# Patient Record
Sex: Female | Born: 1950
Health system: Southern US, Community
[De-identification: ages and names within clinical notes are randomized; demographics above are authoritative.]

## PROBLEM LIST (undated history)

## (undated) DIAGNOSIS — E119 Type 2 diabetes mellitus without complications: Secondary | ICD-10-CM

## (undated) DIAGNOSIS — R42 Dizziness and giddiness: Secondary | ICD-10-CM

## (undated) DIAGNOSIS — K219 Gastro-esophageal reflux disease without esophagitis: Secondary | ICD-10-CM

## (undated) DIAGNOSIS — M1990 Unspecified osteoarthritis, unspecified site: Secondary | ICD-10-CM

## (undated) DIAGNOSIS — M545 Low back pain, unspecified: Secondary | ICD-10-CM

## (undated) DIAGNOSIS — G5600 Carpal tunnel syndrome, unspecified upper limb: Secondary | ICD-10-CM

## (undated) DIAGNOSIS — F419 Anxiety disorder, unspecified: Secondary | ICD-10-CM

## (undated) DIAGNOSIS — E785 Hyperlipidemia, unspecified: Secondary | ICD-10-CM

## (undated) DIAGNOSIS — F329 Major depressive disorder, single episode, unspecified: Secondary | ICD-10-CM

## (undated) DIAGNOSIS — K589 Irritable bowel syndrome without diarrhea: Secondary | ICD-10-CM

## (undated) DIAGNOSIS — Z780 Asymptomatic menopausal state: Secondary | ICD-10-CM

## (undated) DIAGNOSIS — G4730 Sleep apnea, unspecified: Secondary | ICD-10-CM

## (undated) DIAGNOSIS — I10 Essential (primary) hypertension: Secondary | ICD-10-CM

## (undated) DIAGNOSIS — IMO0002 Reserved for concepts with insufficient information to code with codable children: Secondary | ICD-10-CM

## (undated) DIAGNOSIS — G629 Polyneuropathy, unspecified: Secondary | ICD-10-CM

## (undated) DIAGNOSIS — J189 Pneumonia, unspecified organism: Secondary | ICD-10-CM

## (undated) DIAGNOSIS — I2510 Atherosclerotic heart disease of native coronary artery without angina pectoris: Secondary | ICD-10-CM

## (undated) DIAGNOSIS — N182 Chronic kidney disease, stage 2 (mild): Secondary | ICD-10-CM

## (undated) DIAGNOSIS — R911 Solitary pulmonary nodule: Secondary | ICD-10-CM

## (undated) HISTORY — PX: ABDOMINAL HYSTERECTOMY: SHX81

## (undated) HISTORY — DX: Carpal tunnel syndrome, unspecified upper limb: G56.00

## (undated) HISTORY — DX: Essential (primary) hypertension: I10

## (undated) HISTORY — DX: Low back pain, unspecified: M54.50

## (undated) HISTORY — DX: Irritable bowel syndrome, unspecified: K58.9

## (undated) HISTORY — DX: Type 2 diabetes mellitus without complications: E11.9

## (undated) HISTORY — DX: Low back pain: M54.5

## (undated) HISTORY — PX: MULTIPLE TOOTH EXTRACTIONS: SHX2053

## (undated) HISTORY — PX: CATARACT EXTRACTION: SUR2

## (undated) HISTORY — DX: Reserved for concepts with insufficient information to code with codable children: IMO0002

## (undated) HISTORY — DX: Unspecified osteoarthritis, unspecified site: M19.90

## (undated) HISTORY — DX: Chronic kidney disease, stage 2 (mild): N18.2

## (undated) HISTORY — DX: Atherosclerotic heart disease of native coronary artery without angina pectoris: I25.10

## (undated) HISTORY — DX: Asymptomatic menopausal state: Z78.0

## (undated) HISTORY — DX: Anxiety disorder, unspecified: F41.9

## (undated) HISTORY — DX: Solitary pulmonary nodule: R91.1

## (undated) HISTORY — PX: OTHER SURGICAL HISTORY: SHX169

## (undated) HISTORY — DX: Hyperlipidemia, unspecified: E78.5

## (undated) HISTORY — DX: Gastro-esophageal reflux disease without esophagitis: K21.9

## (undated) HISTORY — DX: Major depressive disorder, single episode, unspecified: F32.9

---

## 2002-06-26 DIAGNOSIS — I219 Acute myocardial infarction, unspecified: Secondary | ICD-10-CM

## 2002-06-26 HISTORY — DX: Acute myocardial infarction, unspecified: I21.9

## 2002-12-31 ENCOUNTER — Inpatient Hospital Stay (HOSPITAL_COMMUNITY): Admission: EM | Admit: 2002-12-31 | Discharge: 2003-01-03 | Payer: Self-pay | Admitting: *Deleted

## 2002-12-31 ENCOUNTER — Encounter: Payer: Self-pay | Admitting: Internal Medicine

## 2003-01-01 ENCOUNTER — Encounter (INDEPENDENT_AMBULATORY_CARE_PROVIDER_SITE_OTHER): Payer: Self-pay | Admitting: Cardiology

## 2003-01-02 ENCOUNTER — Encounter: Payer: Self-pay | Admitting: *Deleted

## 2003-01-09 ENCOUNTER — Encounter: Payer: Self-pay | Admitting: Family Medicine

## 2003-01-09 ENCOUNTER — Ambulatory Visit (HOSPITAL_COMMUNITY): Admission: RE | Admit: 2003-01-09 | Discharge: 2003-01-09 | Payer: Self-pay | Admitting: Family Medicine

## 2003-01-29 ENCOUNTER — Encounter: Payer: Self-pay | Admitting: Family Medicine

## 2003-01-29 ENCOUNTER — Ambulatory Visit (HOSPITAL_COMMUNITY): Admission: RE | Admit: 2003-01-29 | Discharge: 2003-01-29 | Payer: Self-pay | Admitting: Family Medicine

## 2003-12-30 ENCOUNTER — Ambulatory Visit (HOSPITAL_COMMUNITY): Admission: RE | Admit: 2003-12-30 | Discharge: 2003-12-30 | Payer: Self-pay | Admitting: Family Medicine

## 2004-01-08 ENCOUNTER — Inpatient Hospital Stay (HOSPITAL_COMMUNITY): Admission: RE | Admit: 2004-01-08 | Discharge: 2004-01-18 | Payer: Self-pay | Admitting: Specialist

## 2004-05-05 ENCOUNTER — Encounter (HOSPITAL_COMMUNITY): Admission: RE | Admit: 2004-05-05 | Discharge: 2004-06-09 | Payer: Self-pay | Admitting: Specialist

## 2005-10-25 ENCOUNTER — Ambulatory Visit (HOSPITAL_COMMUNITY): Admission: RE | Admit: 2005-10-25 | Discharge: 2005-10-25 | Payer: Self-pay | Admitting: Emergency Medicine

## 2005-11-21 ENCOUNTER — Ambulatory Visit: Payer: Self-pay | Admitting: Cardiology

## 2005-12-21 ENCOUNTER — Ambulatory Visit (HOSPITAL_COMMUNITY): Admission: RE | Admit: 2005-12-21 | Discharge: 2005-12-21 | Payer: Self-pay | Admitting: Emergency Medicine

## 2006-05-01 ENCOUNTER — Encounter (INDEPENDENT_AMBULATORY_CARE_PROVIDER_SITE_OTHER): Payer: Self-pay | Admitting: Family Medicine

## 2006-05-04 ENCOUNTER — Ambulatory Visit: Payer: Self-pay | Admitting: Family Medicine

## 2006-05-08 ENCOUNTER — Ambulatory Visit (HOSPITAL_COMMUNITY): Admission: RE | Admit: 2006-05-08 | Discharge: 2006-05-08 | Payer: Self-pay | Admitting: Family Medicine

## 2006-05-29 ENCOUNTER — Encounter: Payer: Self-pay | Admitting: Family Medicine

## 2006-05-29 DIAGNOSIS — E785 Hyperlipidemia, unspecified: Secondary | ICD-10-CM | POA: Insufficient documentation

## 2006-05-29 DIAGNOSIS — K589 Irritable bowel syndrome without diarrhea: Secondary | ICD-10-CM

## 2006-05-29 DIAGNOSIS — G5600 Carpal tunnel syndrome, unspecified upper limb: Secondary | ICD-10-CM

## 2006-05-29 DIAGNOSIS — M545 Low back pain, unspecified: Secondary | ICD-10-CM | POA: Insufficient documentation

## 2006-05-29 DIAGNOSIS — K5900 Constipation, unspecified: Secondary | ICD-10-CM | POA: Insufficient documentation

## 2006-05-29 DIAGNOSIS — M129 Arthropathy, unspecified: Secondary | ICD-10-CM | POA: Insufficient documentation

## 2006-05-29 DIAGNOSIS — I10 Essential (primary) hypertension: Secondary | ICD-10-CM | POA: Insufficient documentation

## 2006-05-31 ENCOUNTER — Ambulatory Visit: Payer: Self-pay | Admitting: Family Medicine

## 2006-06-01 ENCOUNTER — Ambulatory Visit (HOSPITAL_COMMUNITY): Admission: RE | Admit: 2006-06-01 | Discharge: 2006-06-01 | Payer: Self-pay | Admitting: Family Medicine

## 2006-06-02 ENCOUNTER — Encounter (INDEPENDENT_AMBULATORY_CARE_PROVIDER_SITE_OTHER): Payer: Self-pay | Admitting: Family Medicine

## 2006-06-14 ENCOUNTER — Ambulatory Visit: Payer: Self-pay | Admitting: Family Medicine

## 2006-06-28 ENCOUNTER — Encounter (INDEPENDENT_AMBULATORY_CARE_PROVIDER_SITE_OTHER): Payer: Self-pay | Admitting: Family Medicine

## 2006-07-05 ENCOUNTER — Ambulatory Visit: Payer: Self-pay | Admitting: Orthopedic Surgery

## 2006-07-18 ENCOUNTER — Encounter: Admission: RE | Admit: 2006-07-18 | Discharge: 2006-07-18 | Payer: Self-pay | Admitting: Specialist

## 2006-07-23 ENCOUNTER — Ambulatory Visit: Payer: Self-pay | Admitting: Family Medicine

## 2006-07-24 ENCOUNTER — Encounter (INDEPENDENT_AMBULATORY_CARE_PROVIDER_SITE_OTHER): Payer: Self-pay | Admitting: Family Medicine

## 2006-07-24 LAB — CONVERTED CEMR LAB
ALT: 14 units/L (ref 0–35)
AST: 22 units/L (ref 0–37)
Albumin: 4.1 g/dL (ref 3.5–5.2)
Alkaline Phosphatase: 52 units/L (ref 39–117)
BUN: 16 mg/dL (ref 6–23)
Basophils Absolute: 0.1 10*3/uL (ref 0.0–0.1)
Basophils Relative: 1 % (ref 0–1)
CO2: 21 meq/L (ref 19–32)
Eosinophils Relative: 6 % — ABNORMAL HIGH (ref 0–5)
Hemoglobin: 13 g/dL (ref 12.0–15.0)
Lymphs Abs: 2.5 10*3/uL (ref 0.7–3.3)
Platelets: 275 10*3/uL (ref 150–400)
Potassium: 3.9 meq/L (ref 3.5–5.3)
RDW: 14.1 % — ABNORMAL HIGH (ref 11.5–14.0)
Sodium: 143 meq/L (ref 135–145)
Triglycerides: 163 mg/dL — ABNORMAL HIGH (ref <150)
WBC: 6 10*3/uL (ref 4.0–10.5)

## 2006-07-31 ENCOUNTER — Encounter (INDEPENDENT_AMBULATORY_CARE_PROVIDER_SITE_OTHER): Payer: Self-pay | Admitting: Family Medicine

## 2006-07-31 ENCOUNTER — Ambulatory Visit: Payer: Self-pay | Admitting: Cardiology

## 2006-08-09 ENCOUNTER — Encounter: Payer: Self-pay | Admitting: Orthopedic Surgery

## 2006-08-09 ENCOUNTER — Encounter: Admission: RE | Admit: 2006-08-09 | Discharge: 2006-08-09 | Payer: Self-pay | Admitting: Specialist

## 2006-08-28 ENCOUNTER — Ambulatory Visit: Payer: Self-pay | Admitting: Family Medicine

## 2006-08-28 DIAGNOSIS — IMO0002 Reserved for concepts with insufficient information to code with codable children: Secondary | ICD-10-CM

## 2006-08-28 DIAGNOSIS — F341 Dysthymic disorder: Secondary | ICD-10-CM

## 2006-08-28 LAB — CONVERTED CEMR LAB: LDL Goal: 100 mg/dL

## 2006-09-13 ENCOUNTER — Emergency Department (HOSPITAL_COMMUNITY): Admission: EM | Admit: 2006-09-13 | Discharge: 2006-09-13 | Payer: Self-pay | Admitting: Emergency Medicine

## 2006-09-28 ENCOUNTER — Encounter (INDEPENDENT_AMBULATORY_CARE_PROVIDER_SITE_OTHER): Payer: Self-pay | Admitting: Family Medicine

## 2006-10-03 ENCOUNTER — Encounter (INDEPENDENT_AMBULATORY_CARE_PROVIDER_SITE_OTHER): Payer: Self-pay | Admitting: Family Medicine

## 2006-10-04 ENCOUNTER — Ambulatory Visit: Payer: Self-pay | Admitting: Orthopedic Surgery

## 2006-10-10 ENCOUNTER — Encounter (HOSPITAL_COMMUNITY): Admission: RE | Admit: 2006-10-10 | Discharge: 2006-11-09 | Payer: Self-pay | Admitting: Orthopedic Surgery

## 2006-10-15 ENCOUNTER — Ambulatory Visit: Payer: Self-pay | Admitting: Orthopedic Surgery

## 2006-10-23 ENCOUNTER — Ambulatory Visit: Payer: Self-pay | Admitting: Family Medicine

## 2006-12-04 ENCOUNTER — Encounter (INDEPENDENT_AMBULATORY_CARE_PROVIDER_SITE_OTHER): Payer: Self-pay | Admitting: Family Medicine

## 2006-12-26 ENCOUNTER — Encounter
Admission: RE | Admit: 2006-12-26 | Discharge: 2007-03-26 | Payer: Self-pay | Admitting: Physical Medicine & Rehabilitation

## 2006-12-26 ENCOUNTER — Ambulatory Visit: Payer: Self-pay | Admitting: Physical Medicine & Rehabilitation

## 2007-01-22 ENCOUNTER — Ambulatory Visit: Payer: Self-pay | Admitting: Family Medicine

## 2007-01-22 ENCOUNTER — Telehealth (INDEPENDENT_AMBULATORY_CARE_PROVIDER_SITE_OTHER): Payer: Self-pay | Admitting: *Deleted

## 2007-01-23 ENCOUNTER — Telehealth (INDEPENDENT_AMBULATORY_CARE_PROVIDER_SITE_OTHER): Payer: Self-pay | Admitting: *Deleted

## 2007-01-23 LAB — CONVERTED CEMR LAB
ALT: 15 units/L (ref 0–35)
Albumin: 4.3 g/dL (ref 3.5–5.2)
CO2: 29 meq/L (ref 19–32)
Chloride: 103 meq/L (ref 96–112)
Cholesterol: 132 mg/dL (ref 0–200)
HDL: 52 mg/dL (ref >39)
LDL Cholesterol: 57 mg/dL (ref 0–99)
Sodium: 142 meq/L (ref 135–145)
Total Protein: 7.3 g/dL (ref 6.0–8.3)
VLDL: 23 mg/dL (ref 0–40)

## 2007-01-25 ENCOUNTER — Encounter (INDEPENDENT_AMBULATORY_CARE_PROVIDER_SITE_OTHER): Payer: Self-pay | Admitting: Family Medicine

## 2007-01-28 ENCOUNTER — Ambulatory Visit: Payer: Self-pay | Admitting: Physical Medicine & Rehabilitation

## 2007-01-29 ENCOUNTER — Ambulatory Visit (HOSPITAL_COMMUNITY): Admission: RE | Admit: 2007-01-29 | Discharge: 2007-01-29 | Payer: Self-pay | Admitting: Family Medicine

## 2007-01-31 ENCOUNTER — Telehealth (INDEPENDENT_AMBULATORY_CARE_PROVIDER_SITE_OTHER): Payer: Self-pay | Admitting: *Deleted

## 2007-02-01 ENCOUNTER — Encounter
Admission: RE | Admit: 2007-02-01 | Discharge: 2007-05-02 | Payer: Self-pay | Admitting: Physical Medicine & Rehabilitation

## 2007-02-04 ENCOUNTER — Ambulatory Visit: Payer: Self-pay | Admitting: Physical Medicine & Rehabilitation

## 2007-02-15 ENCOUNTER — Encounter (INDEPENDENT_AMBULATORY_CARE_PROVIDER_SITE_OTHER): Payer: Self-pay | Admitting: Family Medicine

## 2007-02-15 ENCOUNTER — Ambulatory Visit (HOSPITAL_COMMUNITY): Admission: RE | Admit: 2007-02-15 | Discharge: 2007-02-15 | Payer: Self-pay | Admitting: Obstetrics & Gynecology

## 2007-03-12 ENCOUNTER — Ambulatory Visit: Payer: Self-pay | Admitting: Family Medicine

## 2007-03-12 DIAGNOSIS — K219 Gastro-esophageal reflux disease without esophagitis: Secondary | ICD-10-CM | POA: Insufficient documentation

## 2007-04-02 ENCOUNTER — Ambulatory Visit: Payer: Self-pay | Admitting: Physical Medicine & Rehabilitation

## 2007-05-02 ENCOUNTER — Encounter
Admission: RE | Admit: 2007-05-02 | Discharge: 2007-05-03 | Payer: Self-pay | Admitting: Physical Medicine & Rehabilitation

## 2007-05-02 ENCOUNTER — Ambulatory Visit: Payer: Self-pay | Admitting: Physical Medicine & Rehabilitation

## 2007-05-13 ENCOUNTER — Ambulatory Visit: Payer: Self-pay | Admitting: Family Medicine

## 2007-05-14 ENCOUNTER — Telehealth (INDEPENDENT_AMBULATORY_CARE_PROVIDER_SITE_OTHER): Payer: Self-pay | Admitting: *Deleted

## 2007-05-14 LAB — CONVERTED CEMR LAB
AST: 16 units/L (ref 0–37)
Albumin: 4.3 g/dL (ref 3.5–5.2)
BUN: 19 mg/dL (ref 6–23)
CO2: 29 meq/L (ref 19–32)
Calcium: 9.7 mg/dL (ref 8.4–10.5)
Creatinine, Ser: 1.18 mg/dL (ref 0.40–1.20)
Glucose, Bld: 80 mg/dL (ref 70–99)
Potassium: 4.2 meq/L (ref 3.5–5.3)
Total Bilirubin: 0.8 mg/dL (ref 0.3–1.2)
Total Protein: 7.4 g/dL (ref 6.0–8.3)

## 2007-05-21 ENCOUNTER — Encounter (INDEPENDENT_AMBULATORY_CARE_PROVIDER_SITE_OTHER): Payer: Self-pay | Admitting: Family Medicine

## 2007-05-29 ENCOUNTER — Encounter (INDEPENDENT_AMBULATORY_CARE_PROVIDER_SITE_OTHER): Payer: Self-pay | Admitting: Family Medicine

## 2007-06-25 ENCOUNTER — Ambulatory Visit: Payer: Self-pay | Admitting: Physical Medicine & Rehabilitation

## 2007-06-25 ENCOUNTER — Encounter
Admission: RE | Admit: 2007-06-25 | Discharge: 2007-09-05 | Payer: Self-pay | Admitting: Physical Medicine & Rehabilitation

## 2007-08-09 ENCOUNTER — Encounter (INDEPENDENT_AMBULATORY_CARE_PROVIDER_SITE_OTHER): Payer: Self-pay | Admitting: Family Medicine

## 2007-08-09 ENCOUNTER — Ambulatory Visit: Payer: Self-pay | Admitting: Cardiology

## 2007-08-12 ENCOUNTER — Ambulatory Visit: Payer: Self-pay | Admitting: Family Medicine

## 2007-08-12 ENCOUNTER — Telehealth (INDEPENDENT_AMBULATORY_CARE_PROVIDER_SITE_OTHER): Payer: Self-pay | Admitting: *Deleted

## 2007-09-02 ENCOUNTER — Ambulatory Visit: Payer: Self-pay | Admitting: Internal Medicine

## 2007-09-10 ENCOUNTER — Ambulatory Visit (HOSPITAL_COMMUNITY): Admission: RE | Admit: 2007-09-10 | Discharge: 2007-09-10 | Payer: Self-pay | Admitting: Internal Medicine

## 2007-09-10 ENCOUNTER — Ambulatory Visit: Payer: Self-pay | Admitting: Internal Medicine

## 2007-10-16 ENCOUNTER — Encounter
Admission: RE | Admit: 2007-10-16 | Discharge: 2007-10-18 | Payer: Self-pay | Admitting: Physical Medicine & Rehabilitation

## 2007-10-17 ENCOUNTER — Encounter: Payer: Self-pay | Admitting: Orthopedic Surgery

## 2007-10-17 ENCOUNTER — Telehealth (INDEPENDENT_AMBULATORY_CARE_PROVIDER_SITE_OTHER): Payer: Self-pay | Admitting: *Deleted

## 2007-10-17 ENCOUNTER — Ambulatory Visit (HOSPITAL_COMMUNITY): Admission: RE | Admit: 2007-10-17 | Discharge: 2007-10-17 | Payer: Self-pay | Admitting: Family Medicine

## 2007-10-18 ENCOUNTER — Ambulatory Visit: Payer: Self-pay | Admitting: Physical Medicine & Rehabilitation

## 2007-11-06 ENCOUNTER — Encounter
Admission: RE | Admit: 2007-11-06 | Discharge: 2007-11-11 | Payer: Self-pay | Admitting: Physical Medicine & Rehabilitation

## 2007-11-11 ENCOUNTER — Ambulatory Visit: Payer: Self-pay | Admitting: Physical Medicine & Rehabilitation

## 2007-11-11 ENCOUNTER — Ambulatory Visit: Payer: Self-pay | Admitting: Family Medicine

## 2007-11-11 LAB — CONVERTED CEMR LAB
Bilirubin Urine: NEGATIVE
Blood in Urine, dipstick: NEGATIVE
Nitrite: NEGATIVE
Protein, U semiquant: NEGATIVE
Specific Gravity, Urine: 1.01
Urobilinogen, UA: 0.2

## 2007-11-12 LAB — CONVERTED CEMR LAB
ALT: 16 units/L (ref 0–35)
AST: 20 units/L (ref 0–37)
Albumin: 3.9 g/dL (ref 3.5–5.2)
Alkaline Phosphatase: 61 units/L (ref 39–117)
BUN: 13 mg/dL (ref 6–23)
Basophils Absolute: 0.1 10*3/uL (ref 0.0–0.1)
Basophils Relative: 1 % (ref 0–1)
CO2: 23 meq/L (ref 19–32)
Cholesterol: 145 mg/dL (ref 0–200)
LDL Cholesterol: 61 mg/dL (ref 0–99)
MCHC: 31.9 g/dL (ref 30.0–36.0)
Platelets: 281 10*3/uL (ref 150–400)
Potassium: 3.9 meq/L (ref 3.5–5.3)
TSH: 0.446 microintl units/mL (ref 0.350–5.50)
Total Protein: 7 g/dL (ref 6.0–8.3)
WBC: 6.1 10*3/uL (ref 4.0–10.5)

## 2007-12-13 ENCOUNTER — Encounter
Admission: RE | Admit: 2007-12-13 | Discharge: 2007-12-17 | Payer: Self-pay | Admitting: Physical Medicine & Rehabilitation

## 2007-12-17 ENCOUNTER — Ambulatory Visit: Payer: Self-pay | Admitting: Physical Medicine & Rehabilitation

## 2008-01-01 ENCOUNTER — Encounter
Admission: RE | Admit: 2008-01-01 | Discharge: 2008-02-27 | Payer: Self-pay | Admitting: Physical Medicine & Rehabilitation

## 2008-01-02 ENCOUNTER — Ambulatory Visit: Payer: Self-pay | Admitting: Physical Medicine & Rehabilitation

## 2008-02-03 ENCOUNTER — Ambulatory Visit: Payer: Self-pay | Admitting: Physical Medicine & Rehabilitation

## 2008-02-10 ENCOUNTER — Ambulatory Visit: Payer: Self-pay | Admitting: Family Medicine

## 2008-02-10 DIAGNOSIS — R7309 Other abnormal glucose: Secondary | ICD-10-CM

## 2008-02-11 ENCOUNTER — Ambulatory Visit (HOSPITAL_COMMUNITY): Admission: RE | Admit: 2008-02-11 | Discharge: 2008-02-11 | Payer: Self-pay | Admitting: Family Medicine

## 2008-02-24 ENCOUNTER — Ambulatory Visit: Payer: Self-pay | Admitting: Family Medicine

## 2008-02-24 DIAGNOSIS — E669 Obesity, unspecified: Secondary | ICD-10-CM

## 2008-02-27 ENCOUNTER — Ambulatory Visit: Payer: Self-pay | Admitting: Physical Medicine & Rehabilitation

## 2008-03-19 ENCOUNTER — Ambulatory Visit: Payer: Self-pay | Admitting: Family Medicine

## 2008-03-25 ENCOUNTER — Encounter
Admission: RE | Admit: 2008-03-25 | Discharge: 2008-06-23 | Payer: Self-pay | Admitting: Physical Medicine & Rehabilitation

## 2008-03-26 ENCOUNTER — Encounter
Admission: RE | Admit: 2008-03-26 | Discharge: 2008-03-26 | Payer: Self-pay | Admitting: Physical Medicine & Rehabilitation

## 2008-03-26 ENCOUNTER — Ambulatory Visit: Payer: Self-pay | Admitting: Physical Medicine & Rehabilitation

## 2008-04-02 ENCOUNTER — Ambulatory Visit: Payer: Self-pay | Admitting: Family Medicine

## 2008-04-24 ENCOUNTER — Ambulatory Visit: Payer: Self-pay | Admitting: Physical Medicine & Rehabilitation

## 2008-04-27 ENCOUNTER — Ambulatory Visit: Payer: Self-pay | Admitting: Orthopedic Surgery

## 2008-04-27 DIAGNOSIS — M25559 Pain in unspecified hip: Secondary | ICD-10-CM | POA: Insufficient documentation

## 2008-05-01 ENCOUNTER — Ambulatory Visit (HOSPITAL_COMMUNITY)
Admission: RE | Admit: 2008-05-01 | Discharge: 2008-05-01 | Payer: Self-pay | Admitting: Physical Medicine & Rehabilitation

## 2008-05-13 ENCOUNTER — Ambulatory Visit: Payer: Self-pay | Admitting: Physical Medicine & Rehabilitation

## 2008-06-02 ENCOUNTER — Encounter (INDEPENDENT_AMBULATORY_CARE_PROVIDER_SITE_OTHER): Payer: Self-pay | Admitting: Family Medicine

## 2008-06-15 ENCOUNTER — Ambulatory Visit (HOSPITAL_COMMUNITY): Admission: RE | Admit: 2008-06-15 | Discharge: 2008-06-15 | Payer: Self-pay | Admitting: Family Medicine

## 2008-07-02 ENCOUNTER — Ambulatory Visit (HOSPITAL_COMMUNITY): Admission: RE | Admit: 2008-07-02 | Discharge: 2008-07-02 | Payer: Self-pay | Admitting: Family Medicine

## 2008-07-02 ENCOUNTER — Ambulatory Visit: Payer: Self-pay | Admitting: Family Medicine

## 2008-07-02 DIAGNOSIS — M25569 Pain in unspecified knee: Secondary | ICD-10-CM

## 2008-07-03 ENCOUNTER — Encounter (INDEPENDENT_AMBULATORY_CARE_PROVIDER_SITE_OTHER): Payer: Self-pay | Admitting: Family Medicine

## 2008-07-06 LAB — CONVERTED CEMR LAB
Albumin: 4.4 g/dL (ref 3.5–5.2)
Calcium: 9.3 mg/dL (ref 8.4–10.5)
Glucose, Bld: 96 mg/dL (ref 70–99)
Sodium: 141 meq/L (ref 135–145)
Total Protein: 7.3 g/dL (ref 6.0–8.3)

## 2008-08-24 ENCOUNTER — Ambulatory Visit: Payer: Self-pay | Admitting: Cardiology

## 2008-11-26 ENCOUNTER — Ambulatory Visit: Payer: Self-pay | Admitting: Family Medicine

## 2008-11-26 LAB — CONVERTED CEMR LAB
Blood in Urine, dipstick: NEGATIVE
Glucose, Urine, Semiquant: NEGATIVE
Specific Gravity, Urine: 1.02
Urobilinogen, UA: NEGATIVE
WBC Urine, dipstick: NEGATIVE
pH: 5

## 2008-11-30 ENCOUNTER — Encounter (INDEPENDENT_AMBULATORY_CARE_PROVIDER_SITE_OTHER): Payer: Self-pay | Admitting: Family Medicine

## 2008-12-01 LAB — CONVERTED CEMR LAB
CO2: 27 meq/L (ref 19–32)
Calcium: 9.6 mg/dL (ref 8.4–10.5)
Creatinine, Ser: 1.01 mg/dL (ref 0.40–1.20)
Eosinophils Absolute: 0.3 10*3/uL (ref 0.0–0.7)
Glucose, Bld: 100 mg/dL — ABNORMAL HIGH (ref 70–99)
HCT: 39 % (ref 36.0–46.0)
HDL: 51 mg/dL (ref >39)
MCHC: 32.8 g/dL (ref 30.0–36.0)
Monocytes Absolute: 0.4 10*3/uL (ref 0.1–1.0)
Neutro Abs: 2 10*3/uL (ref 1.7–7.7)
Platelets: 280 10*3/uL (ref 150–400)
Potassium: 4 meq/L (ref 3.5–5.3)
RBC: 4.3 M/uL (ref 3.87–5.11)
Sodium: 143 meq/L (ref 135–145)
Total Protein: 7.3 g/dL (ref 6.0–8.3)

## 2008-12-03 ENCOUNTER — Ambulatory Visit: Payer: Self-pay | Admitting: Family Medicine

## 2008-12-03 LAB — CONVERTED CEMR LAB
Blood Glucose, AC Bkfst: 102 mg/dL
Hgb A1c MFr Bld: 5.9 %

## 2008-12-14 ENCOUNTER — Telehealth (INDEPENDENT_AMBULATORY_CARE_PROVIDER_SITE_OTHER): Payer: Self-pay | Admitting: *Deleted

## 2009-02-02 ENCOUNTER — Ambulatory Visit: Payer: Self-pay | Admitting: Family Medicine

## 2009-02-23 ENCOUNTER — Encounter (INDEPENDENT_AMBULATORY_CARE_PROVIDER_SITE_OTHER): Payer: Self-pay | Admitting: Family Medicine

## 2009-03-23 ENCOUNTER — Encounter (INDEPENDENT_AMBULATORY_CARE_PROVIDER_SITE_OTHER): Payer: Self-pay | Admitting: *Deleted

## 2009-03-23 LAB — CONVERTED CEMR LAB: Creatinine, Ser: 1.04 mg/dL

## 2009-05-26 ENCOUNTER — Encounter (INDEPENDENT_AMBULATORY_CARE_PROVIDER_SITE_OTHER): Payer: Self-pay | Admitting: *Deleted

## 2009-05-26 ENCOUNTER — Ambulatory Visit: Payer: Self-pay | Admitting: Cardiology

## 2009-05-26 DIAGNOSIS — I2510 Atherosclerotic heart disease of native coronary artery without angina pectoris: Secondary | ICD-10-CM | POA: Insufficient documentation

## 2009-06-15 ENCOUNTER — Encounter: Payer: Self-pay | Admitting: Family Medicine

## 2009-06-16 ENCOUNTER — Telehealth (INDEPENDENT_AMBULATORY_CARE_PROVIDER_SITE_OTHER): Payer: Self-pay | Admitting: *Deleted

## 2009-06-22 ENCOUNTER — Inpatient Hospital Stay (HOSPITAL_COMMUNITY): Admission: RE | Admit: 2009-06-22 | Discharge: 2009-06-26 | Payer: Self-pay | Admitting: Specialist

## 2009-12-16 ENCOUNTER — Encounter (INDEPENDENT_AMBULATORY_CARE_PROVIDER_SITE_OTHER): Payer: Self-pay

## 2009-12-16 LAB — CONVERTED CEMR LAB
ALT: 18 units/L
AST: 21 units/L
Alkaline Phosphatase: 80 units/L
BUN: 15 mg/dL
CO2: 25 meq/L
Creatinine, Ser: 1.11 mg/dL
Glomerular Filtration Rate, Af Am: 62 mL/min/{1.73_m2}
HDL: 49 mg/dL
Hemoglobin: 12.2 g/dL
Sodium: 142 meq/L
WBC: 5.2 10*3/uL

## 2010-02-11 ENCOUNTER — Ambulatory Visit: Payer: Self-pay | Admitting: Cardiology

## 2010-02-11 ENCOUNTER — Encounter: Payer: Self-pay | Admitting: Adult Health

## 2010-02-14 ENCOUNTER — Encounter: Payer: Self-pay | Admitting: Cardiology

## 2010-07-18 ENCOUNTER — Encounter: Payer: Self-pay | Admitting: Family Medicine

## 2010-07-26 NOTE — Assessment & Plan Note (Signed)
Summary: 8/11 f/u per checkout on 05/26/09/tg  Medications Added SIMVASTATIN 80 MG TABS (SIMVASTATIN) One nightly RA ACID REDUCER 75 MG TABS (RANITIDINE HCL) take 1 tab daily VITAMIN D 400 UNIT CAPS (CHOLECALCIFEROL) take 1 tab daily GABAPENTIN 100 MG CAPS (GABAPENTIN) take 3 tabs daily NITROLINGUAL 0.4 MG/SPRAY SOLN (NITROGLYCERIN) One spray under tongue every 5 minutes as needed for chest pain---may repeat times three      Allergies Added: NKDA  Visit Type:  Follow-up Referring Provider:  Dr. Vira [name] Primary Provider:  Dr.Steve [name]  CC:  no cardiology complaints today.  History of Present Illness: [name] is a pleasant obese, 60 y/o AAF we are following with known history of NQWMI in 2004 with implantation of DES to the ostium of the RCA. On last visit she was without of chest discomfort.  She has had lumbar spine surgery in December of 2010 and status that it has been helpful to her. She unfortunately has not increased her activity since she is feeling better.  She remains very sedentary.  She was also placed on simvistatin 80mg  at HS due to hypercholesterolemia.  She is here for follow-up. She is without cardiac complaint.  Preventive Screening-Counseling & Management  Caffeine-Diet-Exercise     Does Patient Exercise: no     Exercise Counseling: to improve exercise regimen  Current Medications (verified): 1)  Lisinopril 40 Mg Tabs (Lisinopril) .... Once Daily 2)  Simvastatin 80 Mg Tabs (Simvastatin) .... One Nightly 3)  Norvasc 10 Mg Tabs (Amlodipine Besylate) .... Once Daily 4)  Aspirin 81 Mg Chew (Aspirin) .... Once Daily 5)  Hydrochlorothiazide 25 Mg  Tabs (Hydrochlorothiazide) .... One Daily 6)  Ra Acid Reducer 75 Mg Tabs (Ranitidine Hcl) .... Take 1 Tab Daily 7)  Vitamin D 400 Unit Caps (Cholecalciferol) .... Take 1 Tab Daily 8)  Gabapentin 100 Mg Caps (Gabapentin) .... Take 3 Tabs Daily 9)  Nitrolingual 0.4 Mg/spray Soln (Nitroglycerin) .... One Spray Under  Tongue Every 5 Minutes As Needed For Chest Pain---May Repeat Times Three  Allergies (verified): No Known Drug Allergies  Past History:  Past medical, surgical, family and social histories (including risk factors) reviewed, and no changes noted (except as noted below).  Past Medical History: Reviewed history from 05/26/2009 and no changes required. ASCVD:non-Q myocardial infarction in 2004 with PCI of the RCA; normal ejection fraction HYPERTENSION (ICD-401.9) HYPERLIPIDEMIA (ICD-272.4) SPECIAL SCREENING FOR MALIGNANT NEOPLASMS COLON (ICD-V76.51) CONSTIPATION (ICD-564.00) VIRAL URI (ICD-465.9) GERD (ICD-530.81) PULMONARY NODULE - REPEAT CT AUGUST 2009 (ICD-518.89) RISK OF SLEEP APNEA (ICD-V12.59) ANXIETY DEPRESSION (ICD-300.4) DEGENERATIVE DISC DISEASE (ICD-722.6)5 s/p lumbar laminectomy and cervical discectomy/fusion; Pain clinic CARPAL TUNNEL SYNDROME (ICD-354.0) ARTHRITIS (ICD-716.90) CONSTIPATION NOS (ICD-564.00) IBS (ICD-564.1) LOW BACK PAIN (ICD-724.2)  Past Surgical History: Reviewed history from 05/26/2009 and no changes required. Cholecystectomy for gallstones Excision of ovarian cyst Hysterectomy due to fibroids and excessibve bleeding - partial L4-5 Fusion with pedicle screws and rods in 2005 by Dr. Otelia [name]  Right shoulder RTC Cervical discectomy and fusion at C5-6  Family History: Reviewed history from 11/26/2008 and no changes required. Father: Dead 74 CKD Mother: Dead 30 CVA Siblings:  Sister Dead 71 "CAD, Cardiomyopathy" Brother x 2: Dead in 64s from some type of cancer, 61 - HTN, MVA  KIds - boys x 1 - age 49  - HTN Daughters - none  Social History: Reviewed history from 05/26/2009 and no changes required. 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   Vital Signs:  Patient profile:   60 year old female Weight:      248 pounds Pulse rate:   78 / minute BP sitting:   120 / 80  (right arm)  Vitals Entered By: [name] Saa, CNA (February 11, 2010 11:06 AM)  Physical Exam  General:  Well developed, well nourished, in no acute distress. Lungs:  Clear bilaterally to auscultation and percussion. Heart:  Non-displaced PMI, chest non-tender; regular rate and rhythm, S1, S2 without murmurs, rubs or gallops. Carotid upstroke normal, no bruit. Normal abdominal aortic size, no bruits. Femorals normal pulses, no bruits. Pedals normal pulses. No edema, no varicosities. Abdomen:  Bowel sounds positive; abdomen soft and non-tender without masses, organomegaly, or hernias noted. No hepatosplenomegaly. Msk:  Back normal, normal gait. Muscle strength and tone normal. Pulses:  pulses normal in all 4 extremities Extremities:  No clubbing or cyanosis. Neurologic:  Alert and oriented x 3. Psych:  Normal affect.   EKG  Procedure date:  02/11/2010  Findings:      Normal sinus rhythm with rate of:  76 bpm  Impression & Recommendations:  Problem # 1:  ATHEROSCLEROTIC CARDIOVASCULAR DISEASE (ICD-429.2) She appears stable from CV standpoint.  She is asymptomatic.  No medication changes are warrented at this time.  Will see her in 1 year.  Problem # 2:  HYPERLIPIDEMIA (ICD-272.4) Reviewed recent lab work post statin: TC: 170, TG 186 LDL 84, HDL 49 which is improved.  She will need wt loss and increased exercise to help with this. Low cholesterol diet. Her updated medication list for this problem includes:    Simvastatin 80 Mg Tabs (Simvastatin) ..... One nightly  Problem # 3:  OBESITY (ICD-278.00) Weight loss, discussed doing 15 minutes of exercise-walking every day  Patient Instructions: 1)  Your physician recommends that you schedule a follow-up appointment in: 1 year 2)  Your physician  has recommended you make the following change in your medication: Start using Nitroglycerin spray as needed for chest pain as directed 3)  Your physician recommended you take 1 tablet (or 1 spray) under tongue at onset of chest pain; you may repeat every 5 minutes for up to 3 doses. If 3 or more doses are required, call 911 and proceed to the ER immediately. Prescriptions: SIMVASTATIN 80 MG TABS (SIMVASTATIN) One nightly  #30 x 11   Entered by:   Larita Fife Via LPN   Authorized by:   Joni Reining, NP   Signed by:   Larita Fife Via LPN on 16/03/9603   Method used:   Electronically to        [facility] [phone]* (retail)       [address]       [place], [place]  [address]       Ph: [phone] or [phone]       Fax: [phone]   RxID:   [id number]

## 2010-07-26 NOTE — Miscellaneous (Signed)
Summary: CMP, CBC, Lipids, HGB A1c and TSH  Clinical Lists Changes  Observations: Added new observation of CALCIUM: 9.4 mg/dL (16/03/9603 54:09) Added new observation of ALBUMIN: 4.4 g/dL (81/19/1478 29:56) Added new observation of PROTEIN, TOT: 7.1 g/dL (21/30/8657 84:69) Added new observation of SGPT (ALT): 18 units/L (12/16/2009 15:15) Added new observation of SGOT (AST): 21 units/L (12/16/2009 15:15) Added new observation of ALK PHOS: 80 units/L (12/16/2009 15:15) Added new observation of BILI DIRECT: BILI Total: 0.2 mg/dL (62/95/2841 32:44) Added new observation of GFR AA: 62 mL/min/1.21m2 (12/16/2009 15:15) Added new observation of GFR: 54 mL/min (12/16/2009 15:15) Added new observation of CREATININE: 1.11 mg/dL (06/28/7251 66:44) Added new observation of BUN: 15 mg/dL (03/47/4259 56:38) Added new observation of CO2 PLSM/SER: 25 meq/L (12/16/2009 15:15) Added new observation of CL SERUM: 106 meq/L (12/16/2009 15:15) Added new observation of K SERUM: 4.2 meq/L (12/16/2009 15:15) Added new observation of NA: 142 meq/L (12/16/2009 15:15) Added new observation of LDL: 84 mg/dL (75/64/3329 51:88) Added new observation of HDL: 49 mg/dL (41/66/0630 16:01) Added new observation of TRIGLYC TOT: 186 mg/dL (09/32/3557 32:20) Added new observation of CHOLESTEROL: 170 mg/dL (25/42/7062 37:62) Added new observation of PLATELETK/UL: 320 K/uL (12/16/2009 15:15) Added new observation of MCV: 90 fL (12/16/2009 15:15) Added new observation of HCT: 35.8 % (12/16/2009 15:15) Added new observation of HGB: 12.2 g/dL (83/15/1761 60:73) Added new observation of WBC COUNT: 5.2 10*3/microliter (12/16/2009 15:15) Added new observation of TSH: 2.250 microintl units/mL (12/16/2009 15:15) Added new observation of HGBA1C: 5.9 % (12/16/2009 15:15)

## 2010-07-26 NOTE — Letter (Signed)
Summary: LABS 12-16-09  LABS 12-16-09   Imported By: Faythe Ghee 02/14/2010 11:58:25  _____________________________________________________________________  External Attachment:    Type:   Image     Comment:   External Document

## 2010-09-26 LAB — BASIC METABOLIC PANEL
BUN: 11 mg/dL (ref 6–23)
BUN: 9 mg/dL (ref 6–23)
CO2: 28 mEq/L (ref 19–32)
Calcium: 7.8 mg/dL — ABNORMAL LOW (ref 8.4–10.5)
Calcium: 8 mg/dL — ABNORMAL LOW (ref 8.4–10.5)
Chloride: 106 mEq/L (ref 96–112)
Chloride: 110 mEq/L (ref 96–112)
Creatinine, Ser: 1.06 mg/dL (ref 0.4–1.2)
Creatinine, Ser: 1.11 mg/dL (ref 0.4–1.2)
GFR calc Af Amer: >60 mL/min (ref >60)
GFR calc Af Amer: >60 mL/min (ref >60)
GFR calc non Af Amer: 50 mL/min — ABNORMAL LOW (ref >60)
Glucose, Bld: 111 mg/dL — ABNORMAL HIGH (ref 70–99)

## 2010-09-26 LAB — URINALYSIS, ROUTINE W REFLEX MICROSCOPIC
Bilirubin Urine: NEGATIVE
Bilirubin Urine: NEGATIVE
Glucose, UA: NEGATIVE mg/dL
Hgb urine dipstick: NEGATIVE
Hgb urine dipstick: NEGATIVE
Ketones, ur: NEGATIVE mg/dL
Ketones, ur: NEGATIVE mg/dL
Nitrite: NEGATIVE
Nitrite: NEGATIVE
Specific Gravity, Urine: 1.014 (ref 1.005–1.030)
Specific Gravity, Urine: 1.021 (ref 1.005–1.030)
pH: 5.5 (ref 5.0–8.0)

## 2010-09-26 LAB — COMPREHENSIVE METABOLIC PANEL
ALT: 20 U/L (ref 0–35)
AST: 24 U/L (ref 0–37)
Alkaline Phosphatase: 65 U/L (ref 39–117)
CO2: 29 mEq/L (ref 19–32)
Chloride: 101 mEq/L (ref 96–112)
GFR calc Af Amer: 60 mL/min — ABNORMAL LOW (ref >60)
GFR calc non Af Amer: 49 mL/min — ABNORMAL LOW (ref >60)
Glucose, Bld: 102 mg/dL — ABNORMAL HIGH (ref 70–99)
Potassium: 3.5 mEq/L (ref 3.5–5.1)
Sodium: 139 mEq/L (ref 135–145)
Total Bilirubin: 0.5 mg/dL (ref 0.3–1.2)

## 2010-09-26 LAB — TYPE AND SCREEN: ABO/RH(D): B POS

## 2010-09-26 LAB — DIFFERENTIAL
Basophils Absolute: 0 10*3/uL (ref 0.0–0.1)
Basophils Relative: 1 % (ref 0–1)
Eosinophils Absolute: 0.1 10*3/uL (ref 0.0–0.7)
Eosinophils Relative: 2 % (ref 0–5)
Lymphs Abs: 2.6 10*3/uL (ref 0.7–4.0)

## 2010-09-26 LAB — CBC
Hemoglobin: 14 g/dL (ref 12.0–15.0)
RBC: 4.44 MIL/uL (ref 3.87–5.11)
WBC: 6.4 10*3/uL (ref 4.0–10.5)

## 2010-09-26 LAB — HEMOGLOBIN AND HEMATOCRIT, BLOOD
HCT: 23.5 % — ABNORMAL LOW (ref 36.0–46.0)
HCT: 24 % — ABNORMAL LOW (ref 36.0–46.0)
HCT: 26.3 % — ABNORMAL LOW (ref 36.0–46.0)
HCT: 31.5 % — ABNORMAL LOW (ref 36.0–46.0)
Hemoglobin: 10.9 g/dL — ABNORMAL LOW (ref 12.0–15.0)
Hemoglobin: 8.8 g/dL — ABNORMAL LOW (ref 12.0–15.0)

## 2010-09-26 LAB — PROTIME-INR: Prothrombin Time: 12.3 seconds (ref 11.6–15.2)

## 2010-09-26 LAB — POCT I-STAT 4, (NA,K, GLUC, HGB,HCT)
Glucose, Bld: 120 mg/dL — ABNORMAL HIGH (ref 70–99)
HCT: 29 % — ABNORMAL LOW (ref 36.0–46.0)

## 2010-09-26 LAB — ABO/RH: ABO/RH(D): B POS

## 2010-11-08 NOTE — Procedures (Signed)
[name], GENO NO.:  [id number]   MEDICAL RECORD NO.:  [id number]          PATIENT TYPE:  REC   LOCATION:  [facility]                          FACILITY:  [facility]   PHYSICIAN:  Erick Colace, M.D.DATE OF BIRTH:  [birth year]   DATE OF PROCEDURE:  02/03/2008  DATE OF DISCHARGE:                               OPERATIVE REPORT   PROCEDURE:  Right T12, L1, and L2 radiofrequency neurotomy under  fluoroscopic guidance.   INDICATIONS:  Lumbar spondylosis with facet-mediated pain that is  demonstrated by positive response of two sets of fluoroscopically-guided  medial branch blocks, which relieved her upper back pain on the right  side.  It did not relieve the right buttock pain.   Pain interferes with self-care and mobility and persists despite  medication management.   Informed consent was obtained after describing the risks and benefits of  the procedure with the patient.  These include bleeding, bruising, and  infection.  She elects to proceed and has given written consent.  The  patient was placed prone on fluoroscopy table, Betadine prep and sterile  drape.  A 25-gauge 1-1/2-inch needle was used to anesthetize the skin  and subcutaneous tissue, 1% lidocaine x 2 mL, and a 22-gauge 10-cm RF  needle with a 10-mm curved active tip was inserted under fluoroscopic  guidance, first starting the L1 SAP transverse process junction, bone  contact made and confirmed with lateral imaging.  Sensory stem at 50 Hz  followed by motor stem at 2 Hz confirmed proper needle location followed  by injection of 1 mL of solution containing 1 mL of 4 mg/cc  dexamethasone and 2 mL of 1% MPF lidocaine followed by radiofrequency  lesioning at 70 degrees Celsius for 70 seconds.  Then, the right L2 SAP  transverse process junction was targeted, bone contact made, confirmed  with lateral imaging.  Sensory stem at 50 Hz followed by motor stem at 2  Hz confirmed proper needle location followed  by injection of 1 mL of  dexamethasone lidocaine solution and radiofrequency lesioning at 70  degrees Celsius for 70 seconds.  Then, the right L3 SAP transverse  process junction targeted, bone contact made, and confirmed with lateral  imaging.  Sensory stem at 50 Hz followed by motor stem at 2 Hz confirmed  proper needle location followed by injection of 1 mL of dexamethasone  lidocaine solution and radiofrequency lesioning at 70 degrees Celsius  for 70 seconds.  The patient tolerated the procedure well.  Pre and post  injection and vitals stable.  Post injection instructions were given.   In regards to right buttock pain, she has had previous good relief  lasting for several months.  Right SI injection last done approximately  1 year ago.  We will schedule in 1 month for her residual right buttock  discomfort.      Erick Colace, M.D.  Electronically Signed     AEK/MEDQ  D:  02/03/2008 11:50:54  T:  02/04/2008 01:30:51  Job:  [id number]

## 2010-11-08 NOTE — Assessment & Plan Note (Signed)
Ms. Seebeck returns today.  She had a right sacroiliac injection  approximately 1 month ago on February 27, 2008.  She reports improvement  in her pain level and is walking more now.  She was recently scheduled  for repeat sacroiliac injections today; however, she is feeling quite  well.  Her Oswestry disability index is 24%.  Pain level is about a 5  and increases with walking and standing.  She could walk 35-45 minutes  at a time; however, at this point she still has difficulties with  household duties and shopping.   REVIEW OF SYSTEMS:  Positive for depression, constipation, reflux, and  abdominal pain.   SOCIAL HISTORY:  Married.   MEDICATIONS:  1. Amrix 15 nightly.  2. Norco 10/325 t.i.d.   PHYSICAL EXAMINATION:  She has some tenderness to right PSIS that is  mild.  She has no tenderness over the greater trochanters.  She has  normal strength in the lower extremities and normal gait.  Her lumbar  range of motion is 75% forward flexion and 50% extension.   IMPRESSION:  1. Right sacroiliac pain, improved after injection.  2. History of upper lumbar spondylosis without myelopathy, also has      improvement, and no symptoms after radiofrequency procedure about 2      months ago.   I will see her back should her pain recur.  She will follow up with Dr.  Cato Mulligan next month.      Erick Colace, M.D.  Electronically Signed     AEK/MedQ  D:  03/26/2008 04:54:09  T:  03/26/2008 81:19:14  Job #:  [id number]

## 2010-11-08 NOTE — Procedures (Signed)
[name], [name] NO.:  [id number]   MEDICAL RECORD NO.:  [id number]          PATIENT TYPE:  REC   LOCATION:  [facility]                          FACILITY:  [facility]   PHYSICIAN:  Erick Colace, M.D.DATE OF BIRTH:  1950-12-25   DATE OF PROCEDURE:  11/11/2007  DATE OF DISCHARGE:                               OPERATIVE REPORT   PROCEDURE:  Bilateral T12, L1, L2 medial branch blocks under  fluoroscopic guidance.   INDICATION:  Lumbar facet pain.  She is status post L4-5 fusion.  Her  pain is only partial responsive to medication management and other  conservative care.   Informed consent was obtained after describing risks and benefits of the  procedure to the patient.  These include bleeding, bruising, infection.  She elects to proceed and has given written consent.   DESCRIPTION OF PROCEDURE:  The patient placed prone on fluoroscopy  table.  Betadine prep, sterile drape, 25-gauge 1-1/2-inch needle was  used to anesthetize the skin and subcu tissue with 1% lidocaine x2 mL.  Then a 22 gauge 3-1/2-inch spinal needle was inserted first targeting  left L3 SAP transverse process junction.  Bone contact made and  confirmed with lateral imaging.  Omnipaque 180 x 0.5 mL demonstrated no  intravascular uptake and 0.5 mL of dexamethasone lidocaine solution was  injected.  Then the left L2 SAP transverse junction targeted, bone  contact made and confirmed with lateral imaging.  Omnipaque 180 x 0.5 mL  demonstrated no intravascular uptake and 0.5 mL of dexamethasone  lidocaine solution was injected.  Then the left L1 SAP transverse  process junction targeted, bone contact made, confirmed with lateral  imaging.  Omnipaque 180 x 0.5 mL demonstrated no intravascular uptake.  Then 0.5 mL of dexamethasone lidocaine solution was injected.  The same  procedure was repeated on the right side at corresponding levels.  This  was to denervate the L1-2, L2-3 levels.  The patient  tolerated procedure  well.  Pre and post injection vitals stable.  If no significant relief,  would consider L5-S1 intra-articular injections versus L3-4 intra-  articular injections.      Erick Colace, M.D.  Electronically Signed     AEK/MEDQ  D:  11/11/2007 14:29:51  T:  11/11/2007 15:02:19  Job:  [id number]

## 2010-11-08 NOTE — Letter (Signed)
August 09, 2007    Franchot Heidelberg, M.D.  [address], [address]  [place], [place]  [address]   RE:  CARLETHIA, MESQUITA  MRN:  [id number]  /  DOB:  12-11-1950   Dear Remi Haggard,   Ms. Arguijo returns for an annual visit for coronary disease and  cardiovascular risk factors.  Since her last appearance in the office,  she has done just fine.  She reports no dyspnea or chest discomfort.  She has not had any cardiac symptoms whatsoever since suffering a non-Q  myocardial infarction in 2004.  She seen in the emergency department on  one occasion for GI symptoms that resolved spontaneously.  She continues  to be followed in pain clinic for chronic back discomfort.  She  experiences fairly frequent leg cramps at night.   Current medications include HCTZ 25 mg daily, lisinopril 40 mg daily,  amlodipine 10 mg daily, naproxen sodium 500 mg b.i.d., aspirin 81 mg  daily, atorvastatin 80 mg daily.   On exam, pleasant overweight woman in no acute distress.  Weight is 242,  2 pounds more than last year.  Blood pressure 120/80, heart rate 70 and  regular, respirations 18.  NECK:  No jugular venous distention; no carotid bruits.  LUNGS:  Clear.  CARDIAC:  Normal first and second heart sounds; minimal systolic murmur.  ABDOMEN:  Soft and nontender; no bruits; no organomegaly.  EXTREMITIES:  No edema; distal pulses intact.   Chemistry profile from November was normal.  LFTs were normal.  Lipid  profile from April of last year was superb with a total cholesterol 137,  triglycerides of 126, HDL of 52 and LDL of 60.   IMPRESSION:  Ms. Arrazola is doing very well both in terms of her  underlying disease and modification of risk factors.  We discussed the  advisability of taking fish oil 1 capsule b.i.d.  Of course, weight loss  would be of benefit.  I will leave monitoring of laboratory studies to  your discretion and plan to see this nice woman again in 1 year.    Sincerely,      Gerrit Friends.  Dietrich Pates, MD, [facility]  Electronically Signed    RMR/MedQ  DD: 08/09/2007  DT: 08/11/2007  Job #: [id number]

## 2010-11-08 NOTE — Procedures (Signed)
NAMERAAGA, MAEDER NO.:  [id number]   MEDICAL RECORD NO.:  [id number]           PATIENT TYPE:   LOCATION:                                 FACILITY:   PHYSICIAN:  Erick Colace, M.D.DATE OF BIRTH:  1950-09-29   DATE OF PROCEDURE:  02/27/2008  DATE OF DISCHARGE:                               OPERATIVE REPORT   Right sacroiliac injection.   INDICATIONS:  Right sacroiliac pain with previous relief approximately  of 1 year duration after sacroiliac injection performed as noted in  2008.  She has had recurrence of pain.  Pain persists despite medication  management.   Informed consent was obtained after describing risks and benefits of the  procedure with the patient.  These include bleeding, bruising,  infection, temporary or permanent paralysis.  She elects to proceed, and  has given written consent.  The patient was placed prone on fluoroscopy  table.  Betadine prep, sterile drape, 25-gauge inch and a half needle  was used to anesthetize the skin and subcu tissue with 1% lidocaine x2  mL.  Then, a 25-gauge 3 inch spinal needle was inserted right SI joint.  AP and lateral oblique imaging utilized.  Omnipaque 180 x 0.5 mL  demonstrated no intravascular uptake, then 0.5 mL.  The dexamethasone  and lidocaine solution was injected.  The patient tolerated the  procedure well.  Pre and post injection, vitals stable.  Post injection,  instructions given.  We will see her in 1 month.  Reinjection, if needed  at that time.      Erick Colace, M.D.  Electronically Signed     AEK/MEDQ  D:  02/27/2008 09:49:01  T:  02/28/2008 00:28:07  Job:  [id number]

## 2010-11-08 NOTE — Letter (Signed)
August 24, 2008    Franchot Heidelberg, MD  [address]  [place], [place] [address]   RE:  [name] [name], [name] [name]  MRN:  [id number]  /  DOB:  [birth year]   Dear Remi Haggard,   [name] [name] returns to the office for continued assessment and treatment  of cardiovascular disease and risk factors.  Since her last visit, she  has done quite well.  Blood pressure control and lipid control have been  good.  She has seen you on a regular basis for adjustment of  medications.  She remains active, caring for a 55-month-old grandchild  on a daily basis.  She has had no chest pain nor dyspnea.   CURRENT MEDICATIONS:  1. Hydrochlorothiazide 25 mg daily.  2. Lisinopril 40 mg daily.  3. Amlodipine 10 mg daily.  4. Aspirin 81 mg daily.  5. Atorvastatin 80 mg daily.  She can no longer afford this latter      medication.   PHYSICAL EXAMINATION:  GENERAL:  Pleasant woman in no acute distress.  VITAL SIGNS:  The weight is 241, stable.  Blood pressure 110/90, heart  rate 70 and regular, respirations 12 and unlabored.  NECK:  Fullness at the base of the neck, but no thyromegaly; normal  carotid upstrokes without bruits; no JVD.  LUNGS:  Clear.  CARDIAC:  Normal first and second heart sounds; minimal systolic  ejection murmur.  ABDOMEN:  Soft and nontender; normal bowel sounds; no organomegaly; no  masses.  EXTREMITIES:  Normal distal pulses; no edema.   EKG:  Normal sinus rhythm; slightly delayed R-wave progression;  nondiagnostic inferior Q-waves; otherwise normal.  When compared with  prior tracing of July 23, 2006, R-wave progression is now delayed;  lateral T-wave abnormalities have resolved.   Most recent laboratories from May 2009, although, she has had blood  drawn since then.  CBC was normal.  Chemistry profile was normal.  Lipid  profile was excellent.   IMPRESSION:  [name] [name] is doing quite well overall.  She will continue  her current medication except for  atorvastatin.  We will substitute  simvastatin 80 mg daily and check a lipid profile and LFTs in 1 month.  I will plan to see this nice woman again in 1 year.  She has cautioned  to call your office or mine should she develop recurrent cardiac  symptoms.    Sincerely,      Gerrit Friends. Dietrich Pates, MD, [facility]  Electronically Signed    RMR/MedQ  DD: 08/24/2008  DT: 08/25/2008  Job #: [id number]

## 2010-11-08 NOTE — Procedures (Signed)
NAMECANDELARIA, PIES NO.:  [id number]   MEDICAL RECORD NO.:  [id number]          PATIENT TYPE:  REC   LOCATION:  [facility]                          FACILITY:  [facility]   PHYSICIAN:  Erick Colace, M.D.DATE OF BIRTH:  Oct 16, 1950   DATE OF PROCEDURE:  DATE OF DISCHARGE:  02/15/2007                               OPERATIVE REPORT   This is bilateral sacroiliac injection under fluoroscopic guidance.   INDICATIONS:  Sacroiliac arthropathy status post lumbar fusion, pain  only partially relieved by narcotic analgesics and other conservative  care, pain level 05/10.   Initial pain is 05/10, only partial response to narcotic analgesic  medications and other conservative care.  Pain interferes with household  duties and shopping.   Informed consent was obtained after describing risks and benefits of  procedure to the patient.  These include bleeding, bruising, infection,  she elects proceed and has given written consent.  The patient placed  prone on fluoroscopy table.  Betadine prep, sterile drape.  A 25 gauge 1-  1/2 inch needle was used to anesthetize skin and subcu tissue 1%  lidocaine x2 mL at each site.  Then a 25 gauge 3 inch spinal needle was  inserted first in the left SI joint.  AP lateral imaging.  Omnipaque 180  x 0.5 mL demonstrated no intravascular uptake.  Then 1 mL of solution  containing 0.5 mL of 40 mg per  mL Depo-Medrol and 0.5 mL of 2%  lidocaine was injected.  The same procedure was repeated on the right  side,same injectate same procedure technique. The patient tolerated  procedure well.  Pre and post injection vitals stable.      Erick Colace, M.D.  Electronically Signed     AEK/MEDQ  D:  04/02/2007 16:43:56  T:  04/03/2007 07:50:34  Job:  [id number]

## 2010-11-08 NOTE — Assessment & Plan Note (Signed)
HISTORY:  [name] [name] is back reporting her low back and mid-back pain.  Her  mid-back has been doing a little bit better.  Her low back seems to be  still a problem.  She  has been in therapy for two weeks.  She has had  some benefit but is feeling a little more pain this morning due to  overdoing it yesterday in therapy.  She feels that the Lidoderm patch  has helped somewhat.  Therapy is working on posture, core muscle  strengthening, stretching of the back and leg muscles, etc.  She is  receiving that in [facility].  The patient is using Vicodin for  break-through pain at this point.   The patient rates her pain as a 7-8/10.  Describes it as aching.  The  pain interferes with her general activity and relations with others and  enjoyment of life at a moderate level.  The patient can walk for about  10-15 minutes at a time.  The pain is most prominent when she stands or  walks.   REVIEW OF SYSTEMS:  Notable for the above.  She does report some  depression.  Other pertinent positives are above and a full review is in  the written health and history section.   SOCIAL HISTORY:  The patient is married.   PHYSICAL EXAMINATION:  VITAL SIGNS:  Blood pressure 143/95, pulse 76,  respirations 18.  She is saturating 97% on room air.  NEUROLOGIC:  The patient is pleasant, alert and oriented x3.  Affect is  a bit flat at times, but she opened up as we talked more.  Gait is  slightly wide-based.  She has some pain with extension today, less with  flexion rotating to either side.  Straight leg testing was negative.  She does have a  positive Patrick's and compression test on either side  today.  The pain was prominent at the PSIS areas bilaterally, as well as  just distal to this spot.  She had some tenderness in the lower lumbar  facets and paraspinals.  She continues to have a mild right thoracic  scoliosis.  Strength is 5/5.  Sensory exam is intact.  HEART:  Regular.  CHEST:  Clear.  ABDOMEN:  Soft, nontender.  GENERAL:  Cognitively she is appropriate.   ASSESSMENT:  1. Thoracic and lumbar spondylosis.  2. Mild thoracic dextroscoliosis.  3. Thoracic myofascial pain responding to prior trigger point      injections.  4. Lumbar post-laminectomy syndrome.  5. Likely sacroiliac joint dysfunction as the root of her current      acute pain.  6. Morbid obesity.  7. Right rotator cuff syndrome.  8. Arthritis of the hips.   PLAN:  1. Will send the patient to Dr. Erick Colace for bilateral SI      joint injections.  2. Continue Lidoderm patch.  3. Increase hydrocodone to 7.5 mg for break-through pain.  4. Continue with therapy in [place], to focus on strengthening,      stretching, range of motion, etc.  5. She is working on her weight as well, and has lost a few pounds      since I last saw her.  Will need to continue on      this path.  6. I will see her back, pending injections with Dr. Wynn Banker.      Ranelle Oyster, M.D.  Electronically Signed     ZTS/MedQ  D:  01/30/2007 12:31:28  T:  01/30/2007 15:37:01  Job #:  [id number]   cc:   Kerrin Champagne, M.D.  Fax: [phone]

## 2010-11-08 NOTE — Procedures (Signed)
[name] [name], [name] [name]               ACCOUNT NO.:  [id number]   MEDICAL RECORD NO.:  [id number]          PATIENT TYPE:  REC   LOCATION:  TPC                          FACILITY:  [facility]   PHYSICIAN:  Erick Colace, M.D.DATE OF BIRTH:  08/13/1950   DATE OF PROCEDURE:  DATE OF DISCHARGE:                               OPERATIVE REPORT   PROCEDURE:  Bilateral T12, L1, and L2 medial branch block under  fluoroscopic guidance.   INDICATION:  Lumbar facet-mediated pain with improvement x1 after medial  branch blocks at the aforementioned levels performed on Nov 11, 2007.   Pain interferes with self-care and mobility and is only partially  responsive to medication management and other conservative care.   Informed consent was obtained after describing risks and benefits of the  procedure with the patient.  These include bleeding, bruising, and  infection.  She elected to proceed and has given written consent.   The patient was placed prone on fluoroscopy table.  Betadine prep,  sterile drape.  A 25-gauge 1-1/2-inch needle was used to anesthetize the  skin and subcutaneous tissue with 1% lidocaine x2 mL at each of 6 sites.  Then, a 22-gauge, 3-1/2-inch spinal needle was inserted under  fluoroscopic guidance targeting; first, the left L1 SAP transverse  process junction was targeted.  Bone contact made and confirmed with  lateral imaging.  Omnipaque 180 x 0.5 mL demonstrated no intravascular  uptake and 0.5 mL of solution containing 1 mL of 4 mg/mL dexamethasone  and 2 mL of 2% MPF lidocaine was injected.  Then, the left L2 SAP  transverse process junction was targeted.  Bone contact made and  confirmed with lateral imaging.  Omnipaque 180 x 0.5 mL demonstrated no  intravascular uptake and 0.5 mL of dexamethasone-lidocaine solution was  injected.  Then, the left L3 SAP transverse process junction was  targeted.  Bone contact made and confirmed with lateral imaging.  Omnipaque 180 x 0.5 mL  demonstrated no intravascular uptake and 0.5 mL  of dexamethasone-lidocaine solution was injected.  The same procedure  was repeated on the right side at corresponding levels using the same  needle injection technique and injection solution.  The patient  tolerated the procedure well.  Pre and post-injection vitals stable.  Pre-injection pain level 8/10 and post-injection 0/10.  Given her 100%  relief on 2 separate occasions, we will schedule for radiofrequency  neurotomy at T12, L1, and L2 medial branches corresponding to the L1,  L2, and L3 vertebral levels.  We will have her schedule in the next  month.      Erick Colace, M.D.  Electronically Signed     AEK/MEDQ  D:  01/02/2008 16:52:17  T:  01/03/2008 04:40:29  Job:  [id number]

## 2010-11-08 NOTE — Op Note (Signed)
[name] [name], [name] [name]               ACCOUNT NO.:  [id number]   MEDICAL RECORD NO.:  [id number]          PATIENT TYPE:  AMB   LOCATION:  DAY                           FACILITY:  [facility]   PHYSICIAN:  R. Roetta Sessions, M.D. DATE OF BIRTH:  Mar 12, 1951   DATE OF PROCEDURE:  09/10/2007  DATE OF DISCHARGE:                               OPERATIVE REPORT   PROCEDURE:  Diagnostic colonoscopy.   INDICATIONS FOR PROCEDURE:  A 60 year old African American female with  constipation refractory to MiraLax.  It has been the better part of 10  years since she underwent a screening colonoscopy.  A colonoscopy is now  being done in part for screening and in part to evaluate her apparent  refractory constipation.   We started her on some Amitiza 24 mcg twice daily in the office  recently.  She states has already been of great benefit in combating her  constipation better than MiraLax has ever worked.  She is not having any  nausea, headache or diarrhea.  Colonoscopy is now being done.  Risks,  benefits, alternatives, limitations have been reviewed, questions  answered.  Please see documentation in the medical record.   PROCEDURE NOTE:  O2 saturation, blood pressure, pulse and respirations  were monitored throughout the entirety of the procedure.  Conscious  sedation:  Versed 4 mg IV, Demerol 75 mg IV in divided doses.  Instrument:  Pentax video chip system.   FINDINGS:  Digital rectal exam revealed no abnormalities.  Endoscopic  findings:  The prep was good.   Colon:  Colonic mucosa was surveyed from the rectosigmoid junction  through the left, transverse and right colon to the area of the  appendiceal orifice, ileocecal valve and cecum.  These structures were  well-seen and photographed for the record.  From this level the scope  was slowly withdrawn and all previously-mentioned mucosal surfaces were  again seen.  The patient had a long, tortuous, redundant colon requiring  a number of maneuvers  including changing of the patient's position and  external abdominal pressure to reach the cecum.  Otherwise colonic  mucosa appeared normal.  The scope was pulled down into the rectum,  where a thorough examination of the rectal mucosa including retroflexed  view of the anal verge demonstrated only internal hemorrhoids.  The  patient tolerated the procedure well, was reacted in endoscopy.   IMPRESSION:  1. Internal hemorrhoids, otherwise normal rectum.  2. A long tortuous, redundant, but otherwise normal-appearing colon.   RECOMMENDATIONS:  1. Continue Amitiza 24 mcg during a meal twice day.  Warned about side      effects.  2. Follow-up appointment with [place] in 3 months.      Jonathon Bellows, M.D.  Electronically Signed     RMR/MEDQ  D:  09/10/2007  T:  09/10/2007  Job:  [id number]   cc:   Madelin Rear. Sherwood Gambler, MD  Fax: [phone]

## 2010-11-08 NOTE — Procedures (Signed)
NAMEDANAYSIA, RADER NO.:  [id number]   MEDICAL RECORD NO.:  [id number]          PATIENT TYPE:  OUT   LOCATION:  RAD                           FACILITY:  [facility]   PHYSICIAN:  Erick Colace, M.D.DATE OF BIRTH:  07-02-1950   DATE OF PROCEDURE:  DATE OF DISCHARGE:  02/15/2007                               OPERATIVE REPORT   PROCEDURE:  Bilateral sacroiliac joint injection under fluoroscopic  guidance.   INDICATIONS:  Sacroiliac arthropathy status post lumbar fusion with pain  only partially relieved by narcotic analgesics and other conservative  care.  She has had good relief from previous injection approximately 1  month starting to wear off at this time, however.   Her pain is interfering with some of her household duties, and she was  able to improve on ability to clean and take care of her grandchildren  post injection.   Informed consent was obtained after describing the risks and benefits of  the procedure to the patient.  These include bleeding, bruising,  infection, loss of bowel and bladder function, temporary or permanent  paralysis.  She elects to proceed and has given written consent.  The  patient placed prone on fluoroscopy table.  Betadine prep, sterile  drape.  25-gauge 1.5-inch needle was used to incise skin and subcu  tissue with 1% lidocaine x2 mL, and a 25-gauge 3-inch spinal needle was  inserted first in the left SI joint under AP and lateral imaging.  Omnipaque 180 x0.5 mL demonstrated no intravascular uptake, and then 1.5  mL of a solution containing 1 mL of 40 mg/mL Depo-Medrol and 2 mL of 2%  lidocaine MPF were injected.  The same procedure was repeated on the  right side using the same injectate, same technique and equipment.  The  patient tolerated procedure well.  Post-injection instructions given.  Post- injection vitals were stable.  Pre-injection pain level 6/10, post-  injection 0/10.  We will refill hydrocodone and see her  back in 1 month.  End.      Erick Colace, M.D.  Electronically Signed     AEK/MEDQ  D:  02/28/2007 14:08:56  T:  02/28/2007 15:11:02  Job:  [id number]

## 2010-11-08 NOTE — Assessment & Plan Note (Signed)
[name] [name] is back after her MRI of the thoracic spine.  The MRI is notable  for multiple disc protrusions and bulges of a mild variety with various  facet and foraminal stenosis as well.  She has a couple of areas that  may correspond to her pain at T10-T11, T11-T12, where there is  protrusion and facet overgrowth causing mild borderline foraminal  stenosis.  Pain, however, seems to be in the mid back radiating up under  the breast.  Pain is rated 7-8/10.  Pain is sharp, constant, aching.  It  seems to be worse when she bends to that side, when she sits up for a  while and walks.  She also has some pain in her hips and knees too.  She  saw Dr. Romeo Apple for these who stated no new intervention was required.  The patient was using Norco for breakthrough pain.  She is not on  anything else currently.   REVIEW OF SYSTEMS:  Notable for depression, night sweats, constipation,  abdominal pain.  Full review is in the written health and history  section of the chart.   SOCIAL HISTORY:  The patient is married, living with her husband.   PHYSICAL EXAMINATION:  VITAL SIGNS:  Blood pressure is 138/84, pulse is  24, and respiratory rate 18.  She is sating 98% on room air.  GENERAL:  The patient is pleasant, alert, and oriented x3.  She remains  overweight.  MUSCULOSKELETAL:  She has pain along the T10-T11 vertebrae and  associated ribs, although pain does not seem to track to dermatome but  really tracks to rib up towards the right breast.  Flexion causes some  discomfort as well as bending to the right.  She had minimal discomfort  with left bending and rotation.  Skin is not hypersensitive to touch.  Posture is fair.  Low back range of motion is unchanged with some pain  still noted over the lumbar perioperative site.  Shoulder movement was  fair.  Right shoulder remains more limited than left.   ASSESSMENT:  1. Thoracic/lumbar spondylosis.  2. Persistent thoracic pain, which seems to be more  related to the      rib.  This may be intercostal/myofascial type of situation.  Her      MRI does not seem to correspond to her pain presentation precisely.      The patient does not want injections either.  3. Lumbar postlaminectomy syndrome.  4. Obesity.  5. Right rotator cuff syndrome.   PLAN:  1. We will send the patient for outpatient physical therapy to work on      posture modalities, appropriate stretching exercises to see if we      can improve some of these symptoms if in fact they are myofascial.      I did not discern any focal trigger points today, so we did not try      any trigger point injections.  2. We did add q.8 h. Flexeril for spastic symptoms.  3. Norco for breakthrough pain.  4. We discussed appropriate weight loss, diet, and exercise.  5. We will see her back in about 1 to 2 months' time.  The patient was      started on Neurontin apparently by Dr. Erby Pian, which has helped      with sleep.  I would not change this at this point.     Ranelle Oyster, M.D.  Electronically Signed    ZTS/MedQ  D:  05/13/2008  13:03:26  T:  05/14/2008 00:53:03  Job #:  [id number]   cc:   Franchot Heidelberg, M.D.

## 2010-11-08 NOTE — Assessment & Plan Note (Signed)
HISTORY:  [name] [name] is back regarding her low back pain.  She has had some  temporary results with the SI joint injections, but pain has recurred.  She rates her pain as 8/10 on average.  She uses her Norco.  She finds  that capsaicin cream is helpful as well.  She walks 15-20 minutes 3-4  days a week, but really is not doing a lot in the way of further  stretching or exercising.  She states her pain is worse when she stands  and walks for prolonged periods.  She has some pain occasionally up into  the right rib cage, although this is less regular.  I reviewed her MRI  again and she has documented facet arthropathy and postoperative changes  at the L4-L5, L5-S1 levels.   REVIEW OF SYSTEMS:  Notable for spasms, some depression, constipation,  abdominal pain, night sweats.  She has taken a new medication from her  family doctor which has helped her bowels.   SOCIAL HISTORY:  The patient is married and living with her husband.  No  change is otherwise noted.   PHYSICAL EXAMINATION:  VITAL SIGNS:  Blood pressure 135/81, pulse 67,  respiratory rate 20.  She is satting 97% on room air.  GENERAL:  The patient is pleasant, alert and oriented x3.  BACK:  She bent to 60-70 degrees today with minimal pain.  She had some  pain with extension at 15 degrees.  Facet provocation was positive on  either side.  She had palpable tenderness around the operative site  particularly at the L5-S1 and L4-L5 levels.  Rotation and lateral  bending caused some discomfort once again.  Strength is 5/5.  Sensory  exam is intact.  Patrick's test was negative as was straight leg  testing.   ASSESSMENT:  1. Thoracic lumbar spondylosis.  2. Lumbar post-laminectomy syndrome.  3. Mild thoracic levoscoliosis and dextroscoliosis.  4. History of right rib/myofascial pain.  5. Right rotator cuff syndrome.  6. Obesity.   PLAN:  1. The patient's exam was consistent today with facet mediated pain.      Her MRI collaborates.   I will set her up for L4-L5, L5-S1 medial      branch blocks with Dr. Wynn Banker.  2. Refilled Norco 10/325.  3. Continue capsaicin cream to back as this seems to be helping.  4. I will see her back pending the above.      Ranelle Oyster, M.D.  Electronically Signed     ZTS/MedQ  D:  10/18/2007 09:32:09  T:  10/18/2007 10:23:00  Job #:  [id number]

## 2010-11-08 NOTE — Assessment & Plan Note (Signed)
HISTORY:  Albertha is back regarding her back pain.  We sent her for medial  branch blocks.  They were done higher than what I have intended, but she  did have some results for 2-1/2 weeks regardless.  She is complaining  more of intrascapular pain today on the right side.  Pain was disabling  the other night and was causing her to move to tears while trying to  rest over the weekend.  She rates her pain 8-9/10, describes as stabbing  and constant.  She describes her pain is interfering with general  activity, relationship with others, enjoyment of life on a moderate-to-  severe level at times.  She is using Norco for breakthrough pain.  She  has had some capsaicin cream to the back as well as at times which has  been helpful.   REVIEW OF SYSTEMS:  The patient reports depression, night sweats,  constipation, and abdominal pain.  Other pertinent positives as above.  Full review is in written health and history section and chart.   SOCIAL HISTORY:  The patient is married.  Husband is with her today.   PHYSICAL EXAMINATION:  VITAL SIGNS:  Blood pressure is 115/70, pulse 77,  respiratory rate 18, and she is sating 97% on room air.  GENERAL:  The patient is pleasant, alert, and oriented x3.  She remains  overweight.  Continues to have similar pattern in the thoracolumbar  spine with lower level scoliosis and some high dextroscoliosis of the  thoracic spine.  She has some clockwise rotation of the spine as well.  There is prominence of the thoracic paraspinal muscles from  approximately T5-T8.  Low back is tender to palpation around the  surgical site.  More so with extension of facet maneuvers today.  She  had some pain below as well at the PSIS areas in the surrounding  tissues.  Leg strength is generally 5/5.  Sensory exam is normal.  Straight leg testing and Luisa Hart test were negative to equivocal today.   ASSESSMENT:  1. Thoracolumbar spondylosis.  2. Lumbar post laminectomy syndrome.  3. Mild thoracic level scoliosis and higher dextroscoliosis.  4. History of right rib/myofascial pain.  5. Right rotator cuff syndrome.  6. Obesity.   PLAN:  1. We will send the patient for followup RF procedure to upper lumbar      facets.  2. After informed consent, we injected the right thoracic paraspinal      at 2 separate locations essentially T5/T6 and T7/T8 with 2 mL of 1%      lidocaine.  The patient experienced relief before leaving the      office today of the periscapular pain.  3. The patient was given prescription for Amrix 15 mg at bedtime for      muscle spasm.  I urged to use ice and heat as well as stretching as      well.  4. We will see her back pending RF procedure.      Ranelle Oyster, M.D.  Electronically Signed     ZTS/MedQ  D:  12/17/2007 10:55:51  T:  12/18/2007 09:37:35  Job #:  [id number]

## 2010-11-08 NOTE — Assessment & Plan Note (Signed)
[name] [name] is back regarding her back pain.  She has done well with the SI  joint injections and had some benefit with the rib injection at last  visit.  She liked the Voltaren gel, but her Medicaid would not fill it.   The pain is 6-7/10.  The pain interferes with her general activity,  relations with others, enjoyment of life on a moderate level.  Sleep is  fair.  She tries to walk and can go about 10-15 minutes before her back  starts to tighten up in the low lumbar, high pelvic region.   REVIEW OF SYSTEMS:  Notable for the above, as well as some depression  and constipation.  Other pertinent positives are listed above and in the  health history section of the chart.   SOCIAL HISTORY:  The patient is married, living with her husband.   PHYSICAL EXAMINATION:  Blood pressure is 135/71.  Pulse is 71.  Respiratory rate 18.  She is satting 97% on room air.  The patient is pleasant, alert and oriented x3.  She has some pain with  flexion, as well as extension today.  She bends to about 50 to 60  degrees today.  Extension was 15 to 20 degrees with discomfort.  Straight leg testing was negative.  She had negative Patrick's test  today.  She has some tenderness over both PSIS areas.  Rotation and  lateral bending cause some discomfort as well.  The right 10th rib was  less tender today, comparatively.  She remains overweight, but appears  to have lost some weight in general.  Strength is 5/5.  Sensory exam is  intact.   ASSESSMENT:  1. History of thoracic and lumbar spondylosis.  2. Mild thoracic levoscoliosis with dextroscoliosis.  3. Right rib pain/myofascial pain.  4. Lumbar post laminectomy syndrome.  5. Morbid obesity.  6. Right rotator cuff syndrome.   PLAN:  1. Encourage exercise and range of motion.  2. The patient will try Capsaicin cream over-the-counter for her rib      cage, as she had good benefits with the Voltaren gel.  This may      help as well.  3. Encourage weight loss  and exercise again.  Gave her Pilates      exercises to work on.  4. Lidoderm patches p.r.n.  5. I will see her back in about 4 months' time.      Ranelle Oyster, M.D.  Electronically Signed     ZTS/MedQ  D:  06/28/2007 10:00:55  T:  06/28/2007 10:35:25  Job #:  [id number]

## 2010-11-08 NOTE — Assessment & Plan Note (Signed)
[name] [name] is back regarding her multiple pain complaints.  She had good  results with medial branch blocks and SI joint injections per Dr.  Wynn Banker.  She is complaining of some hip pain little more laterally on  the hips and into the knees.  Knees are tender as well.  She has ongoing  right-sided rib pain.  Her pain is a 7-8/10.  She describes it as  stabbing and aching.  Pain interferes with general activity, in  relationship with others, and enjoyment of life on a moderate-to-severe  level.  Sleep is poor.  Pain is worse with walking and sitting.  Knees  and hips are often stiff in the morning when she first begins to move  around.  Pain improves with rest and medications including Norco.  She  was placed on gabapentin by her family physician apparently, Dr.  Erby Pian.   REVIEW OF SYSTEMS:  Notable for occasional depression, night sweats,  constipation, reflux, abdominal pain, and limb swelling.   PHYSICAL EXAMINATION:  VITAL SIGNS:  Blood pressure is 131/80, pulse is  74, respiratory rate 16, and she is sating 98% on room air.  GENERAL:  The patient is pleasant, alert, and oriented x3.  EXTREMITIES:  She is a bit antalgic with her gait.  We bent her lumbar  spine and she had a fair range of motion with flexion and extension.  Extension caused minimal tenderness today.  She had mild tenderness of  both PSIS areas.  She had some pain along the iliac crest and mild-to-  moderate pain along the trochanters, although this did not seem to  reflect her usual pain.  She has pain along the tenth rib approximately  going back up to the spine.  Pain was somewhat worse with thoracic  bending laterally and in flexion-extension plane.  Strength 5/5.  Sensory exam is intact.  Luisa Hart test causes some knee pain.  She had  crepitus of both knees and some pain with meniscal maneuvers.   ASSESSMENT:  1. Thoracic/lumbar spondylosis.  2. Persistent thoracic/rib pain.  3. Lumbar post-laminectomy syndrome  with facet arthropathy and      sacroiliac joint pain.  4. Obesity.  5. Right rotator cuff syndrome.   PLAN:  1. We will order an MRI of her thoracic spine to assess for any      degenerative disk disease and radiculopathy.  Consider thoracic      epidural versus intercostal nerve block.  Her weight certainly      could be contributing her rib/chest pain.  2. Follow up with Dr. Romeo Apple regarding hip pain.  This is ill-      defined, maybe related to trochanteric bursitis versus residual      sacroiliac joint pain.  3. Osteoarthritis of the knees.  May respond to injections.  I will      defer to Dr. Romeo Apple.  4. Refill Norco 10/325.  5. I will see her back, pending the above.      Ranelle Oyster, M.D.  Electronically Signed     ZTS/MedQ  D:  04/24/2008 15:00:01  T:  04/25/2008 01:36:09  Job #:  [id number]   cc:   Franchot Heidelberg, M.D.

## 2010-11-08 NOTE — Consult Note (Signed)
[name] [name], [name] [name]               ACCOUNT NO.:  [id number]   MEDICAL RECORD NO.:  [id number]          PATIENT TYPE:  AMB   LOCATION:  DAY                           FACILITY:  [facility]   PHYSICIAN:  R. Roetta Sessions, M.D. DATE OF BIRTH:  04-20-1951   DATE OF CONSULTATION:  09/02/2007  DATE OF DISCHARGE:                                 CONSULTATION   CHIEF COMPLAINT:  Chronic constipation.  Needs colonoscopy.   HISTORY OF PRESENT ILLNESS:  [name] [name] is a 60 year old female.  She  has history of chronic constipation.  She can go up to 4 to 5 days  without bowel movement.  She has been taking MiraLax which does seem to  help some but not completely.  She is still going several days without a  bowel movement despite taking that.  She denies any rectal bleeding or  melena.  She does have some occasional low abdominal heaviness and  discomfort.  She denies any nausea or vomiting.  Denies any dysphagia or  odynophagia.  She does have chronic GERD.  She has heartburn and  indigestion which is well-controlled so long as she takes Prilosec 20 mg  daily.  She also takes Vicodin as needed for chronic low back pain.  Her  weight has remained stable.   PAST MEDICAL AND SURGICAL HISTORY:  1. Her last colonoscopy was May 09, 1999 by Dr. Jena Gauss.  She was      found to have internal hemorrhoids; otherwise normal exam.  2. She has history of chronic GERD, well controlled on PPI.  3. Coronary disease, status post MI in 2004, status post PTCA.  4. She has shoulder surgery, neck surgery and back surgery.  5. She had a partial hysterectomy.  6. She has history of hypertension.  7. Ovarian cyst.   CURRENT MEDICATIONS:  1. Lipitor 50 mg a day.  2. Lisinopril 40 mg daily.  3. Hydrochlorothiazide 25 mg daily.  4. Amlodipine 10 mg daily.  5. Aspirin 81 mg daily.  6. Vitamin B12 500 mg daily.  7. Prilosec 20 mg p.r.n.  8. Vicodin 5/500 mg daily p.r.n.   ALLERGIES:  NO KNOWN DRUG ALLERGIES.   FAMILY HISTORY:  There is no known family history of carcinoma or  chronic GI problems.  Mother deceased at 20 from CVA.  Father deceased  in his 74s secondary to renal failure.  She has multiple siblings, one  with history of renal carcinoma.   SOCIAL HISTORY:  [name] [name] is married.  She has one healthy son.  She  is disabled.  She denies any tobacco, alcohol or drug use.   REVIEW OF SYSTEMS:  See HPI.   PHYSICAL EXAMINATION:  VITAL SIGNS:  Weight 241 pounds, height 67  inches, temperature 95, blood pressure 124/82, pulse 72.  GENERAL:  [name] [name] is an obese African female, who is alert,  oriented, pleasant, cooperative, in no acute distress.  HEENT:  Clear, nonicteric.  Oropharynx pink, moist without lesions.  NECK:  Supple without mass or thyromegaly  CARDIOVASCULAR:  Regular rate and rhythm.  Normal S1 and S2.  No  murmurs, clicks, rubs, or gallops.  LUNGS:  Clear to auscultation bilaterally.  ABDOMEN:  Positive bowel sounds x4.  No bruits auscultated.  Soft,  nontender without palpable mass or megaly.  No rebound or guarding.  EXTREMITIES:  Without clubbing or edema bilaterally.  SKIN:  Warm, dry without rash or jaundice.   IMPRESSION:  [name] [name] is a 60 year old female with history of  chronic constipation which has not responded well to MiraLax.  Her last  colonoscopy was approximately 8-1/2 years ago and she is requesting a  repeat colonoscopy today which I feel is reasonable given current  guidelines between and 8 and 10 years and the fact that she is having  more constipation not responding to standard therapy.   PLAN:  1. Colonoscopy with Dr. Jena Gauss in the near future.  Discussed procedure      including risks and benefits including but not limited to      infection, perforation, and drug reaction.  Consent will be      obtained.  2. Amitiza 24 mcg one p.o. daily for 2 days, then can increase to one      p.o. b.i.d. #60 with one refill and two box of samples  were given.   I would like to thank Dr. Sherwood Gambler for allowing [place] to participate in the  care of [name] [name].      Lorenza Burton, N.P.      Jonathon Bellows, M.D.  Electronically Signed    KJ/MEDQ  D:  09/02/2007  T:  09/03/2007  Job:  [id number]   cc:   Madelin Rear. Sherwood Gambler, MD  Fax: [phone]

## 2010-11-08 NOTE — Procedures (Signed)
[name], [name] NO.:  [id number]   MEDICAL RECORD NO.:  [id number]          PATIENT TYPE:  REC   LOCATION:  [facility]                          FACILITY:  [facility]   PHYSICIAN:  Erick Colace, M.D.DATE OF BIRTH:  [birth year]   DATE OF PROCEDURE:  02/04/2007  DATE OF DISCHARGE:                               OPERATIVE REPORT   PROCEDURE PERFORMED:  Bilateral sacroiliac injection under fluoroscopic  guidance.   INDICATIONS:  Low back pain, history of lumbar fusion, L4-L5 levels  which was performed in 2005.   PROCEDURE:  Informed consent was obtained after describing risks and  benefits of the procedure to the patient.  These include bleeding,  bruising, infection, loss of bowel and bladder function, temporary or  permanent paralysis.  She elects to proceed and has given written  consent.  The patient was placed prone on the fluoroscopy table.  Betadine prep, sterile drape.  25 gauge 1 1/2 inch needle was used to  anesthetize the skin and subcu tissue with 1% lidocaine x2 mL at each of  the two sites.  Then, a 25 gauge 3 inch spinal needle was inserted under  fluoroscopic guidance, first targeting the left SI joint. Bone contact  made confirmed with lateral imaging foramen.  Omnipaque 180 x 0.5 mL  demonstrated good joint outline and no intravascular uptake followed by  injection of a solution containing 0.5 mL of 40 mg per mL Depo-Medrol  and 1 mL of 2% MPF lidocaine.  Then, the right sacroiliac joint was  injected using same the injectate and technique.  Pre-injection pain  level 8/10, post injection 0/10.  Return in one month for possible  repeat injection.      Erick Colace, M.D.  Electronically Signed     AEK/MEDQ  D:  02/04/2007 17:36:20  T:  02/05/2007 19:52:53  Job:  [id number]   cc:   Ranelle Oyster, M.D.  Fax: [phone]   Kerrin Champagne, M.D.  Fax: [phone]

## 2010-11-08 NOTE — Assessment & Plan Note (Signed)
[name] [name] is back regarding her back pain.  She had good results with the SI  joint injections, and the back really bothers her only when she is up  walking for long periods of time.  She complains more today of the right  upper back hurting underneath the scapula.  She notes that the upper  back bothers her more when she is walking and up a lot during the day.  She rates her pain as 7 to 8 out of 10.  She uses Norco 7.5 for  breakthrough symptoms.  Lidoderm patches help a bit, not extraordinarily  so for her low back symptoms.  The patient notes that pain interferes  with her general activity, relations with others, enjoyment of life on a  moderate level.   REVIEW OF SYSTEMS:  Notable for depression, occasional abdominal pain,  constipation, diarrhea.  She also complains of occasional loss of  balance.  She is losing weight.   SOCIAL HISTORY:  Without significant change today.   PHYSICAL EXAMINATION:  Blood pressure is 119/60.  Pulse 74.  Respiratory  rate 18.  She is saturating 97% on room air.  Patient is pleasant.  She is still overweight, but may have a lost a few  pounds since I saw her last time.  Gait was stable.  She is able to flex about 40 to 50 degrees at the low  back.  She is able to rotate and lateral bend approximately 20 to 25  degrees.  Extension was 10 to 15 degrees.  Straight leg testing was  negative.  Patrick's test was equivocal.  She did have some pain over  the PSIA areas on palpation of the upper back.  Palpated tenderness  along the 10th rib on the right, really along the axillary line.  There  was no perimuscular tenderness.  The pain seemed most centered on the  bone itself.  Upper extremity strength was intact at 5/5.  Sensory exam  was normal.  Cognitively, she was appropriate.   ASSESSMENT:  1. History of thoracic lumbar spondylosis.  2. Mild thoracic levoscoliosis and dextroscoliosis.  3. Rib pain/myofascial pain in the thoracic back.  4. Lumbar post  laminectomy syndrome.  5. Morbid obesity.  6. History of right rotator cuff syndrome.   PLAN:  1. After informed consent, we injected along the 10th rib along the      axillary line with 40 mg of Kenalog and 3 mL of 1% Lidocaine.      Patient tolerated it well.  We will try some Voltaren gel as well,      depending on success with the injection today.  She did have relief      of her pain when she left the office.  2. Refilled Norco number 90.  3. Appropriate weight loss and exercise.  4. Consider other thoracic intervention pending course here.  5. Lidoderm patches p.r.n.  6. I will see her back in 2 months' time.      Ranelle Oyster, M.D.  Electronically Signed     ZTS/MedQ  D:  05/03/2007 10:40:57  T:  05/03/2007 15:32:21  Job #:  [id number]

## 2010-11-11 NOTE — Letter (Signed)
July 31, 2006    Franchot Heidelberg, M.D.  [address], [address]  [place], [place]  [address]   RE:  [name] [name], [name] [name]  MRN:  [id number]  /  DOB:  18-Nov-1950   Dear Remi Haggard:   [name] [name] was seen in the office today at her request for continuing  assessment and treatment of coronary disease and cardiovascular risk  factors.  Her recollection is somewhat indistinct about her last visit.  She actually was not due in the office until May.  Nonetheless, we were  more than happy to reassess her medical therapy.  She has done well from  a symptomatic standpoint with no chest pain and no dyspnea.  Lifestyle  is relatively sedentary.  She has not been instructed in a heart-healthy  diet.  Her principal symptoms have been related to orthopedic problems.  She has had hip and back pain as well as neck pain.  Her blood pressure  control was somewhat suboptimal, prompting you to add amlodipine 10 mg  daily to her medical regimen.  We obtained recent laboratory from your  office, which demonstrated suboptimal control of hyperlipidemia.   PHYSICAL EXAMINATION:  GENERAL:  On exam, an overweight, pleasant woman  in no acute distress.  VITAL SIGNS:  The weight is 240, 9 pounds less than the last weight we  have for her, which is from the year 2000.  Blood pressure 115/75, heart  rate 80 and regular, respirations 16.  NECK:  No jugular venous distention.  Normal carotid upstrokes without  bruits.  SKIN:  Scattered skin tags and pigmented nevi.  LUNGS:  Clear.  CARDIAC:  Normal first and second heart sounds.  Fourth heart sound  present.  Modest basilar systolic ejection murmur.  ABDOMEN:  Soft and nontender.  Normal bowel sounds.  No organomegaly.  EXTREMITIES:  No edema.  Normal distal pulses.   EKG supplied by your office reveals ST-T wave abnormalities consistent  with lateral ischemia, which is unchanged from her prior tracing.  She  has nondiagnostic inferior Q waves,  which are consistent with her  history of prior inferior myocardial infarction.   IMPRESSION:  [name] [name] is doing generally well.  Due to her young age  and known coronary disease, optimal modification of risk factors is  desirable.  We will send her to a dietitian for some instruction  regarding an appropriate diet.  Her treatment with simvastatin will be  changed to Lipitor 80 mg daily.  A repeat lipid profile will be obtained  in 2 months.  If results are good, I will plan to see this nice woman  again in one year.    Sincerely,      Gerrit Friends. Dietrich Pates, MD, [facility]  Electronically Signed    RMR/MedQ  DD: 07/31/2006  DT: 07/31/2006  Job #: [id number]

## 2010-11-11 NOTE — Op Note (Signed)
NAME:  [name] [name], [name] [name]                         ACCOUNT NO.:  [id number]   MEDICAL RECORD NO.:  [id number]                   PATIENT TYPE:  INP   LOCATION:  5007                                 FACILITY:  [facility]   PHYSICIAN:  Kerrin Champagne, M.D.                DATE OF BIRTH:  Apr 13, 1951   DATE OF PROCEDURE:  01/08/2004  DATE OF DISCHARGE:                                 OPERATIVE REPORT   PREOPERATIVE DIAGNOSIS:  1. Painful degenerative spondylolisthesis L4-5 with bilateral L4 nerve root     entrapment and lateral recess stenosis affecting the bilateral L5 nerve     roots.  2. Lateral recess stenosis L5-S1, affecting bilateral S1 nerve roots.  3. Bulging disk right L5-S1 without herniation.   POSTOPERATIVE DIAGNOSES:  1. Painful degenerative spondylolisthesis L4-5 with bilateral L4 nerve root     entrapment and lateral recess stenosis affecting the bilateral L5 nerve     roots.  2. Lateral recess stenosis L5-S1, affecting bilateral S1 nerve roots.  3. Bulging disk right L5-S1 without herniation.   PROCEDURE:  1. Central decompressive laminectomy at L4-5 and at L5-S1 with foraminotomy     bilateral S1, bilateral L5 nerve roots, and foraminal decompression     bilateral L4 nerve roots.  2. Decompression of lateral recess at the L3-4 level.  3. Near complete right L4-5 facetectomy.  4. Right-sided TLIF using a 9 mm DePuy leopard cage with local bone graft.  5. Posterolateral effusion L4-5 utilizing local bone graft and symphony bone     graft material.  6. Internal fixation posteriorly segmentally at the L4-5 level using Monarch     pedicle screws and rods.   SURGEON:  Dr. [name]   ASSISTANT:  Maud Deed, P.A.-C.   ANESTHESIA:  GOT.  Dr. Autumn Patty   ESTIMATED BLOOD LOSS:  500 mL.   DRAINS:  Hemovac x 1, Foley to straight drain.   FLUIDS RECEIVED:  The patient received 2 units of autogenous blood.  She  also received 1 unit of cell saver blood.   BRIEF  CLINICAL HISTORY:  This patient is a 60 year old female with previous  history of neck and shoulder difficulty.  She has had progressive back pain  and radiation in her right leg greater than left, increasing neurogenic  claudication.  She has been treated conservatively with attempts at therapy  as well as steroid injection without relief of pain.  Studies have indicated  the problem of lumbar degenerative spondylolisthesis at the L4-5 level with  entrapment of the bilateral L4 nerve roots, right greater than left, and a  possible protruding disk at the L5-S1 level, and bilateral lateral recess  stenosis at this segment.  Her MRI demonstrates as well severe degenerative  disk changes at the L4-5 level with endplate sclerosis and T2 __________  changes.  She is brought to the operating room to undergo central  decompressive laminectomy  for findings of stenosis above L4-5 and L5-S1 with  posterolateral and TLIF fusion at the L4-5 level for stenosis-type symptoms.   INTRAOPERATIVE FINDINGS:  The patient was found to have severe right L4  foraminal entrapment associated with hypertrophic changes involving the  right facet and the grade 1 spondylolisthesis bulging disk material within  the foramen as well and spurs over the posterolateral endplates at the disk  space at L4-5.  She was also found to have bilateral lateral recess stenosis  at L4-5 and L5-S1.  Foraminal entrapment of the S1 nerve roots, both sides,  L5 nerve roots both sides.  The left L4 nerve root was similarly entrapped,  although not to the extent of the right side.   DESCRIPTION OF PROCEDURE:  After adequate general anesthesia, the patient in  a prone position, chest rolls using a Jackson frame.  Standard preoperative  antibiotics, intraoperative pedicle screw monitoring, and soft tissue  resistance monitoring.  The skin electrodes all in place in the lower  extremities.  All pressure points well padded.  Standard prep with  DuraPrep  solution from the lower dorsal spine to the mid sacral level and over the  iliac crest right side, draped in the usual manner, iodine Vi-Drape was  used.  The incision made approximately 5.5-6 inches in length through the  skin and subcutaneous layers down to the lumbodorsal fascia, and this was  incised midline, carried out over the spinous process of S1, L5, L4, L3.  Intraoperative lateral C-arm used to identify the L4 level with clamp over  its posterior spinous process.  Small portion of this then resected to  continue identification throughout the remainder of the case.  Cobbs used to  elevate the paralumbar muscles off the posterior aspects of the lamina of  L3, L4, L5, and S1 bilaterally.  This was carried out to the facet level.  The facet capsule at the L3-4 level was preserved and exposure obtained out  lateral to the facet at L3-4 exposing the L4 transverse process bilaterally.  Exposure also obtained at the L4-5 level, resecting the capsule at the L4-5  facet bilaterally and then continuing exposure out over the transverse  process of L5 bilaterally.  The paralumbar muscles and intertransverse  muscles were divided for a full exposure of this gutter between the  transverse processes for later posterolateral fusion.   Leksell rongeur then used to remove the spinous process of L4 about 50% of  the inferior aspect of the spinous process of L3 and the superior 50% of S1.  Then excised the spinous process of L5.  Central portions of the lamina of  L5 and L4 were then carefully thinned.  Osteotome used to resect medial 50%  of the joint at the L4-5 level bilaterally, excising a great deal of facet  on the right side, as this was the area for which the TLIF was to be  performed.  Nearly the entire facet was resected on the right side,  preserving just a small amount of superior articular process of L5 and a small amount of the inferior articular process of L4 at this level  in order  to obtain a posterolateral position for placement of the TLIF.   On the left side, about a 50-60% facetectomy performed medially using  osteotomes and a hypertrophic ligamentum flavum identified.  This was  resected both sides and centrally at L4-5, lamina of L4 resected centrally  up to the L3-4 level and at this level,  bilateral medial facetectomies,  about 10% was performed along with resection of hypertrophic ligamentum  flavum medially along the insertion of the inferior-anterior aspect of the  L3 lamina.  This decompressing this area to prevent development of stenosis  later.  A dissection then carried to the L5-S1 level with a central  laminectomy was performed, resecting the central portions of the lamina  using Kerrisons.  This was then opened wider with partial medial facetectomy  of about 15-20% of the joint bilaterally, decompressing the lateral recess  over the S1 nerve root both sides.  L5 nerve root felt to be extremely  sensitive along the right side during the procedure.   Once this was completed, then a D'Errico was placed over the right side of  thecal sac and this side retracted and disk examined and found to be bulging  but not herniated or protruding significantly to require removal.  Attention  then turned to placement of pedicle screws, C-arm fluoroscopy brought in the  field and on the right L4 level, an awl was then used to perform an entry  point into the lateral aspect of the pedicle at the intersection superior  articular process and mid portion of the transverse process.  This was then  used as an entry point, and a hand-held pedicle finder then used to probe  the pedicle.  Several attempts were necessary in order to pass this within  the central portion of the pedicle.  Convergence was necessary at about 30-  40 degrees in order to obtain good purchase within the pedicle.  Once this  was probed, then probing was additionally performed using a ball  tip probe  indicating the anterior cortex of the vertebra was intact as well as the  lateral superior and inferior margins of the pedicle.  This was then  measured for a depth; a 45 mm depth was chosen, tapped to 6.25 tap, and then  a 7.0 screw with 45 mm length was inserted, screwed into place.  On C-arm  fluoroscopy and AP and lateral planes felt to be adequately placed within  the pedicle and the vertebral body of L4.  At the L5 level on the right  side, similarly this was done.  Note that decortication of the transverse  process was carried out followed tapping and symphony bone graft combined  with local bone graft was then able to be placed over the transverse process  and the posterior lateral gutter for a posterolateral effusion at this  point.  Similarly, the pedicle was probed at the L5 level using an awl for  an entry point and C-arm fluoroscopy to ascertain the correct entry point, then probing with a pedicle probe.  Pedicle finder was used to find the  pedicle quite nicely.  This was then tapped to a 6.25 tip and again a 45 mm  length screw placed, a 7.0 with obtaining excellent purchase on the right  side.  Note decortication of the transverse process and bone graft in the L5  transverse process in the intertransverse region, posterolateral effusion  also carried out as well as decortication of the lateral aspects of the par  defects and pars areas along the right side at the L4 posterior elements.  A  55 mm rod was then placed within the fasteners following the breaking of the  fasteners and testing of the screws.  Each of the screws of the right side  tested greater than 20 soft tissue resistance which was felt to  be adequate.  Next, the caps were placed onto the fasteners with the screws capturing the  rods into the fasteners at both 4 and 5 level.  Attention then turned to the  left side where similarly awl was used for an entry point into the pedicle  at L4 on the left  side and then a pedicle finder used to probe the pedicle  along the left side __________ for depth to get a 45 mm x 7.0 screw used  following tapping with 6.25, decortication carried out and symphony and  local bone graft placed in the posterolateral gutter, extending from the  transverse process of L4 to L5.  L5 decorticated and bone grafted.  Prior to  placement of the screw at the L5 level on the left side, again performing  initial entry with an awl and then using a pedicle probe to probe the  pedicle a depth of 45 mm and a 45 mm x 7.0 screw placed at this level as  well.  Another 55 mm rod placed within these fasteners to the screws  following breaking of the screw initially to allow for the bursal position  of the head of the screw.  These were measured also for soft tissue  resistance and measured 20 and greater than 20 at 04 and 5 on the left side.  Rod then placed; end caps then placed without difficulty.  Attention then  turned to the right side following bone graft in both of the posterolateral  areas.  And the TLIF on the right side.  The thecal side on the right side  of the L4-5 level carefully freed up and small epidural veins in this area,  cauterized, divided to allow for traction of the thecal sac medially in the  L4 nerve root on the left side superiorly.  When this was completed and  bleeders well controlled then a 15 blade scalpel used to incise the disk on  the right side and a window of disk material removed, pituitary rongeurs  used to excise the disk on the right side as well as 4 mm and 3 mm Kerrison  to debride annular material.  Care taken to protect the nerve root and the  thecal sac during this portion of procedure.  Dilation of the disk space  carried out using 8 mm and 9 mm and then 11 mm dilator.  The 11 had quite a  tight fit, so the 10 mm cage was felt to be necessary.  The intervertebral disk space was then carefully debrided of all residual degenerated  disk  material, then the endplates curetted down to bleeding endplate bone using  both the straight curettes as well as the upbiting curettes upbiting  superiorly oriented and upbiting inferiorly oriented curettes.  Ring  curettes were additionally used to debride any further residual  cartilaginous endplates down to the bleeding endplate bone.  This then  completed, the intervertebral disk space was irrigated and again pituitaries  used to debride any residual remaining disk material or endplate material.  A trial reduction then performed using a 10 mm trial cage.  This provided an  excellent fit.  The cage was then filled with additional local bone graft  that had been obtained from the facets during facet resection as well as the  spinous process and lamina.  This was very excellent bone graft without any  soft tissue attached.  Bone graft was then placed within the intervertebral  disk space, impacted into place  using the kickers used primarily to kick the  cages into position.  This was used to place the bone graft quite nicely in  the anterior aspect of the disk space.  A 10 mm cage was then placed into  the right side posterolateral position and as impacting other cage was  performed, the cage went on to fracture requiring its removal, and the 9 mm  cage was then chosen for use.  This cage was packed with the autogenous bone  graft that was used previously for the 10 mm cage without difficulty.  The  10 mm cage with the fracture was removed by using the laminar spreader to  spread the disk space and then to excise the cage using pituitary rongeurs  as well as a forceps.  This was done carefully protecting the L4 nerve root  at the thecal sac.  With removal of the fractured cage, careful inspection  demonstrated no further cage material remaining.  The new cage, once it was  packed with bone graft was then able to be placed over the posterolateral  disk space, the laminar spreader  kept in place, and this cage was then  impacted into place.  It was placed well below the posterior aspect of the  disk space.  Then kicked into a transverse position using the kicker  impacters.  Irrigation was then performed.  Careful inspection demonstrated  the L4 nerve roots in good position alignment.  Hockey-stick neural probe  could be passed out both neural foramen at the L4 level without difficulty,  both L5 neural foramen and both S1 neural foramen.  Surgicel, Gelfoam placed  over the right side to control bleeding along the right side.   The rods then carefully aligned within their fasteners in both right and  left side and on the right side the fastener then tightened to the rod to 80  foot pounds using a torque device and a torque area.  This was similarly  performed on the left side at the L4 level as well.  On the right side, then  compression obtained between the L4 and L5 screws and the fastener then attached to the rod at the L5 level on the right side, 80 foot pounds.  This  was then performed on the left side using the compressor on the left side.  This then completed.  Irrigation was performed.  Thrombin-soaked Gelfoam  then placed over the posterior aspect of the laminotomy defect.  A medium  Hemovac drain placed in the depth of the incision existing on the left side.  Platelet-poor plasma was then sprayed over the wound to obtain further  hemostasis.  The paralumbar muscles carefully approximated in the midline  with interrupted sutures of #1 Vicryl loosely.  The lumbodorsal fascia  approximated with interrupted simple figure-of-eight sutures of #1 Vicryl.  The deep subcu layer was approximated with interrupted #1 Vicryl suture,  more superficial layers with interrupted 0 and 2-0 Vicryl sutures.  Skin  closed with a running subcu stitch of 4-0 Vicryl.  Tincture of Benzoin and  Steri-Strips applied, 4 x 4's, ABD pads affixed to the skin with Hypafix  tape.  The  patient was then returned to a supine position, reactivated,  extubated, and returned to the recovery room in satisfactory condition.  Kerrin Champagne, M.D.    Myra Rude  D:  01/08/2004  T:  01/08/2004  Job:  [id number]

## 2010-11-11 NOTE — Cardiovascular Report (Signed)
[name], [name] NO.:  [id number]   MEDICAL RECORD NO.:  [id number]                   PATIENT TYPE:  INP   LOCATION:  2902                                 FACILITY:  [facility]   PHYSICIAN:  [name] [name], M.D.          DATE OF BIRTH:   DATE OF PROCEDURE:  01/01/2003  DATE OF DISCHARGE:                              CARDIAC CATHETERIZATION   PROCEDURES PERFORMED:  Retrograde central aortic catheterization, selective  coronary angiography via Judkins technique, pre and post IC nitroglycerin  administration, LV angiogram RAO, LAO projection, subselective LIMA, RIMA,  aortic root angiogram LAO projection, intracoronary nitroglycerin  administration, abdominal aortic angiogram in PA projection, IVUS  interrogation post IV heparin administration, cutting balloon RCA, cutting  balloon atherectomy with subsequent stent high grade ostial RCA stenosis and  subsequent final IVUS interrogation, Aggrastat bolus plus infusion, Plavix  600 mg p.o., weight-adjusted heparin.   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  She has a  history of chronic hypertension for over 30 years, exogenous obesity, remote  hysterectomy, remote cholecystectomy, prior cervical disk fusion and recent  symptoms of low back pain and possible disk.  She has been under the medical  care of Dr. Lodema [name] on ARD and diuretic.  She was admitted to the hospital  after presenting to [facility] with new onset ischemic sounding substernal  chest pain with radiation to the neck without GI symptoms.  EKG showed LVH  with secondary ST-T wave abnormalities.  She was transferred to [facility] where  enzymes were positive for non-ST elevation myocardial infarction with CPK up  to 363, MB 8.8 and troponin 0.15.  She was stable with medical therapy in  the hospital and was brought to the second floor CP lab after  informed  consent was obtained.   PROCEDURE IN DETAIL:  The right groin was prepped, draped in the usual  manner.  The patient was in postabsorptive and given 5 mg of Valium p.o.  premedication.  The right groin was prepped, draped in the usual manner.  1%  Xylocaine was used for local anesthesia.  She was given intermittent Nubain  and Versed for sedation in the lab.  The CRFA was entered with single  anterior puncture using a 18-gauge thin-wall needle a 6 [place] short.  The  side arm sheath was inserted without difficulty.  Catheterization was done  with 6 French 4-cm taper preformed coronary and pigtail Cordis catheters.  Right coronary ICT NG 200 mcg was given with repeat injections obtained.  Flush injections of the right coronary cusp were also performed.  LV  angiogram was done in the RAO and LAO projection at 25 mL, 14 mL per second;  20 mL, 12 mL per second.  Pullback pressure of the CA showed no gradient  across the aortic valve.  Subselective LIMA and  RIMA were done with the  right coronary catheter showing widely patent IMAs, normal VSCA and LSCA and  normal antegrade vertebral flow bilaterally. Abdominal  angiogram was done  through the pigtail catheter above the level of the renal arteries because  of the patient's severe hypertension at 25 mL,  20 mL per second showing  early bifurcation of the right renal artery with possible 30-40% noncritical  narrowing.  Normal single left renal artery, CRF and SNA.  No significant  renal atherosclerotic disease with a smooth aorta and good runoff through  the proximal iliacs.  The patient tolerated the diagnostic procedure well.   Fluoroscopy demonstrated 2-3+ mitral annular calcification.  There was +1 to  faint left coronary calcification.  No significant aortic root or right  coronary or ostial calcification.   LV angiogram demonstrated a vigorously contracted ventricle with EF of 60%.  No segmental wall motion abnormality and  angiographic LVH present.  No  mitral regurgitation.   PRESSURES:  1. LV:  180/0.  2. LVEDP 18-22 mmHg.  3. CA:  180/90 mmHg.  4. There was no gradient across the aortic valve.   The main left coronary was normal.  The LAD had very minimal irregularity  with no significant stenosis in the proximal third with very tortuous large  vessel that coursed through the apex of the heart.  There was a large septal  perforator given off in the proximal third of the small diagonal after the  SP.   The circumflex artery gave off a very proximally branching trifurcating OM-1  and had 30-40% narrowing in the mid portion, 20-30% ostial and proximal  narrowing which was noncritical.  There was normal flow.  The remainder of  the circumflex was normal comprised of a large OM-2 and a large distal  circumflex and small PABG branch that was normal.   The right coronary demonstrated damping of the 6 French catheter at the  ostia which was not fully engaged.  There was a 95% ostial fairly focal  stenosis visualized pre and post IC nitroglycerin and on all views.  It was  not well seen on initial flush injection.  The remainder of the vessel was  widely patent and smooth and had no significant stenosis.  The small PDA and  PLA and large RC of the proximal third, small RV to the mid portion.  There  was good flow.  It was difficult to tell this ostial lesion had  angiographic clearance to being normal and high grade ostial stenosis  despite the absence of aortic calcification, was compatible with the  patient's history of ischemic chest pain and enzyme elevation.   The cineangiograms were reviewed with Dr. Caprice Kluver and Dr. Geralynn Rile.  We  agreed that further evaluation with IVUS and flush injections might be  helpful and we all felt this was probably a true lesion at this point.  The patient was given weight-adjusted heparin.  ACT was greater than 230  seconds, monitored closely.  The right coronary  was engaged with a side hole  JR-4 Scimed guiding catheter and the lesion was crossed with an Asahi 0.14  light guide wire.  The guide wire was positioned in the distal RCA.  An  Atlantis SR pro IVUS catheter was then advanced into the proximal third of  the RCA and pullback was performed.   This demonstrated high grade ostial concentric essentially noncalcific  stenosis, mildly segmental.  Only the IVUS artifact was visualized so this  was less than 1.5 mm.  The referenced proximal RCA was 3.8 x 4.0 mm with  only minimal soft superior plaque in the proximal third of the vessel.   Also, prior to IVUS interrogation and guiding catheter with a JR-5  diagnostic catheter below the ostium, we did a flush injection that  demonstrated high-grade ostial stenosis--will favor confirmation.   The patient was then given Aggrastat bolus plus infusion and total of 600 mg  Plavix p.o.  The lesion was initially crossed with a  3.5/6 Scimed cutting balloon. Multiple inflations were done at 4 and 5  atmospheres for 10-21 seconds.  There was considerable slippage of the  cutting balloon at the ostia so it was elected to change this to a longer  cutting balloon.  This was done using exchange technique to a 3.5/15 cutting  balloon which was positioned across the ostia and dilated at 6-37 and 6-30.  There was good balloon expansion seen.  This was then exchanged for a 3.5/12  Taxus DES SciMed stent.  This was positioned across the ostia and  approximately 1-2 mm outside of the ostia and deployed at 12-37 and post  dilated at 18 atmospheres for 38 seconds.  There was good balloon expansion.  The balloon was easily removed.  The flush injection showed excellent  angiographic result with good funneling at the ostia and good coverage.  We  then re-interrogated this with the IVUS using exchange technique and this  showed excellent expansion of the stent with a 3.6 x 4.0-mm diameter  essentially full stent  expansion and on IVUS the struts were approximately 1  mm outside of the ostia covering this.   Final injections demonstrated no residual stenosis with TIMI-3 flow.  No  ostial or root dissection.  Dilatation system was removed.  The final ACT  was 230 seconds.  The patient was given 10 mg of Labetalol for persistent  hypertension of 170.  She was on IV nitroglycerin, Aggrastat infusion which  will continue for 18 hours.  Will continue medical therapy of her systemic  hypertension.  Her  ARB was on hold today and will be held until the a.m.  post cath followup laboratory.  She will be empirically started on statin  therapy.  Fortunately, she is not a smoker.  She will need weight reduction,  medical therapy of her co-morbid conditions, dietary and cardiac rehab  consultation.  All this would be fully explained again to the patient and  her family.  CATHETERIZATION DIAGNOSES:  1. ASHD, non-ST elevation myocardial infarction less than 24 hours.  2. High grade ostial right coronary artery stenosis treated with cutting     balloon atherectomy and subsequent DES ostia stenting under IVUS     interrogation, successful.  3. Systemic hypertension, severe and chronic.  Angiographic left ventricular     hypertrophy, normal left ventricular function.  4. Exogenous obesity.  5. Possible hyperlipidemia.  6. Remote cholecystectomy.  7. Remote hysterectomy.  8. Remote cervical fusion.  9. Low back pain, further evaluation  pending.                                               [name] [name], M.D.    RAW/MEDQ  D:  01/01/2003  T:  01/01/2003  Job:  [id number]  [name] [name], M.D.  [address]  [place],  [place] [address]  Fax: [phone]   cc:   [name] [name], M.D.  [address]  [place], [place] [address]  Fax: [phone]

## 2010-11-11 NOTE — Discharge Summary (Signed)
NAME:  [name] [name], [name] [name]                         ACCOUNT NO.:  [id number]   MEDICAL RECORD NO.:  [id number]                   PATIENT TYPE:  INP   LOCATION:  5007                                 FACILITY:  [facility]   PHYSICIAN:  Kerrin Champagne, M.D.                DATE OF BIRTH:  12-21-1950   DATE OF ADMISSION:  01/08/2004  DATE OF DISCHARGE:  01/18/2004                                 DISCHARGE SUMMARY   ADMISSION DIAGNOSES:  1.  Painful degenerative spondylolisthesis L4-5 with bilateral L4 nerve root      entrapment and lateral recess stenosis affecting the bilateral L5 nerve      roots.  2.  Lateral recess stenosis L5-S1 affecting bilateral S1 nerve roots.  3.  Bulging disc right L5-S1 without herniation.  4.  Hypertension.  5.  Dyslipidemia.  6.  History of myocardial infarction in 2004.  7.  Irritable bowel syndrome.  8.  Arthritis.  9.  Status post cervical fusion C5-6.  10. Status post hysterectomy, cholecystectomy and right shoulder      arthroscopy.   DISCHARGE DIAGNOSES:  1.  Painful degenerative spondylolisthesis L4-5 with bilateral L4 nerve root      entrapment and lateral recess stenosis affecting the bilateral L5 nerve      roots.  2.  Lateral recess stenosis L5-S1 affecting bilateral S1 nerve roots.  3.  Bulging disc right L5-S1 without herniation.  4.  Hypertension.  5.  Dyslipidemia.  6.  History of myocardial infarction in 2004.  7.  Irritable bowel syndrome.  8.  Arthritis.  9.  Status post cervical fusion C5-6.  10. Status post hysterectomy, cholecystectomy and right shoulder      arthroscopy.   1.  Postoperative anemia requiring blood transfusion.  2.  Postoperative ileus, resolved at discharge.  3.  Mild renal insufficiency secondary to volume depletion.  4.  Hyponatremia.  5.  Postoperative urinary tract infection.  6.  Oral Candidiasis.   PROCEDURE:  On January 08, 2004, the patient underwent:  1.  Central decompressive laminectomy at L4-5 and L5-S1  with foraminotomy,      bilateral S1, bilateral L5 nerve roots and foraminal decompression      bilateral L4 nerve roots.  2.  Decompression of lateral recess at the L3-4 level.  3.  Near complete right L4-5 facetectomy.  4.  Right-sided TLIF.  5.  Posterolateral fusion L4-5 utilizing local bone graft and Symphony bone      material.  6.  Internal fixation posteriorly at the L4-5 level using pedicle screws and      rods.  This was performed by Dr. Otelia Sergeant, assisted by Wende Neighbors,      P.A.C., under general anesthesia.   CONSULTATIONS:  1.  [facility] gastroenterologist.  2.  Deirdre Peer. Polite, M.D., of [facility].   HISTORY OF PRESENT ILLNESS:  The patient is a 60 year old female with  progressive back pain with radiation into her right lower extremity.  She  has bilaterally increasing neurogenic claudication right greater than left.  Conservative treatment has failed to give her adequate relief including  physical therapy and epidural steroid injections.  Studies have shown lumbar  degenerative spondylolisthesis at L4-5 with entrapment of bilateral L4 nerve  roots right greater than left and possible protruding disc at the L5-S1  level and bilateral lateral recess stenosis at this segment.  It was felt  that she would require surgical intervention for definitive treatment and  was admitted for the procedure as stated above.   HOSPITAL COURSE:  The patient tolerated the procedure under general  anesthesia without complications.  Postoperatively, she was placed on PCA  analgesics for her discomfort.  She had significant pain management problems  and required multiple analgesics throughout the hospital stay but was  gradually weaned from IV analgesics to p.o. analgesics.  The patient did  receive the pneumococcal vaccine postoperatively.  The patient's Hemovac  drain was discontinued on the second postoperative day. Dressing changes  were done daily thereafter and the wound  was found to be healing well  without signs or symptoms of infection.  The patient had elevated fever on  the second postoperative day which continued for several days.  She had no  appetite and complained of abdominal distention and nausea.  Evaluation for  her fever including urinalysis, chest x-ray and treatment with incentive  spirometry, her wound did not show any evidence of infectious process.  The  patient developed anemia which required transfusion of two units of packed  red blood cells twice during the hospital stay.  The patient had significant  constipation which went on to develop into postoperative ileus.  She was  several days with use of medication before having flatus.  She had only a  small amount of flatus and continue to worsen with her ileus despite  treatment with medications, correction of her potassium and increased  activity.  A GI consult was obtained from the [facility].  A rectal  tube was placed with minimal results.  Finally, toward the end of her  hospital stay, she was able to have a bowel movement.  After that time, her  diet was progressed from clear liquids only to a normal diet and she was  able to tolerate this prior to going home.  She was treated with stool  softeners while on her analgesics.  She also was given Protonix for GI  prophylaxis during the hospital stay.  Due to her very slow progress with  physical therapy, she was placed on Lovenox for DVT prophylaxis during the  hospital stay as well.  The patient had hyponatremia during the hospital  stay.  Adjustments in IV fluids were made and the patient's hyponatremia  resolved prior to discharge.  The patient was seen by the [facility]  for concerns of her medical issues.  They assisted with her care during the  hospital stay, adjustment medications as needed.  She did have some mildly elevated blood sugars during the hospital stay, however, hemoglobin A1C was  noted to be 5.4.   Vital signs were stable through the hospitalization.  Physical therapy assisted the patient with ambulation and gait training.  She did have an Aspen brace which was applied while seated at bedside.  She  was allowed weightbearing as tolerated on both lower extremities.  She was  not allowed out of bed without the brace.  Lower extremity Doppler studies  were performed and were negative for DVT.  The patient was able to slowly  progress with ambulation and prior to discharge, was ambulating  approximately 200 feet using a rolling walker and the assistance of the  physical therapist.  The patient was noted to have a urinary tract infection  which was treated with amoxicillin.   On January 18, 2004, she was eventually stable for transfer to her home with  arrangements for adequate home health physical therapy and occupational  therapy as well as Horticulturist, commercial.  She was orthopedically and  medically stable at that time.   PERTINENT LABORATORY DATA:  As stated above, the patient received four units  of packed red blood cells during the hospital stay.  Hemoglobin and  hematocrit on admission were 10.3 and 29.8, respectively.  Values fluctuated  throughout the hospital stay with a low hemoglobin and hematocrit being 7.1  and 21.0.  At discharge, hemoglobin and  hematocrit were 10.0 and 29.0.  Chemistry studies on admission with glucose 111, remaining values within  normal limits. The patient had hyponatremia ranging from 130 to 142, at  discharge sodium was 141.  Magnesium was noted to be 2.8.  Hemoglobin A1C  5.4.  TSH 1.364.  Cortisol level 17.8.  Urinalysis on admission was negative  for urinary tract infection.  Repeat on January 11, 2004, showed multiple  abnormalities.  Eventually, urine culture was obtained and confirmed urinary  tract infection with Proteus and enterococcus.  She was treated with  amoxicillin.  Blood cultures were negative.   Venous Doppler studies of the lower  extremities showed no evidence of DVT,  superficial thrombus or Baker cyst bilaterally.  KUBs were monitored through  the hospital stay and did show dilated bowel, predominantly colon compatible  with adynamic ileus.   PLAN:  The patient allowed discharge to her home once she was medically and  orthopedically stable.  At that time, arrangements were made for her to have  home health physical therapy as well as occupational therapy for ambulation  and gait training.  She was instructed in wearing her brace at all times  with the exception of sleeping. She was allowed to don and doff the brace,  seated at bedside.  The patient will ambulate as tolerated but avoid  bending, twisting or lifting.  She will have a low carbohydrate diet.  Dressing change will be done daily and she will be allowed to shower at  home.   DISCHARGE MEDICATIONS:  1.  Trinsicon one daily.  2.  Protonix 40 mg daily. 3.  Senokot one to two twice daily.  4.  Majik mouthwash 5 mL swish and swallow every six hours.  5.  Amoxicillin 500 mg one p.o. q.6h.  6.  Flexeril as needed for spasm.  7.  Darvocet for pain.  8.  Vistaril for pain as well.  9.  She also has a prescription for Reglan for nausea.   FOLLOW UP:  She will follow up with Dr. Otelia Sergeant two weeks from the date of her  surgery and has been advised to call the office to make the arrangements.  She will follow up with her primary care physician as indicated.   CONDITION ON DISCHARGE:  Stable.      Wende Neighbors, P.A.                    Kerrin Champagne, M.D.    SMV/MEDQ  D:  02/25/2004  T:  02/26/2004  Job:  [id number]

## 2010-11-11 NOTE — Discharge Summary (Signed)
NAME:  [name], [name]                         ACCOUNT NO.:  [id number]   MEDICAL RECORD NO.:  [id number]                   PATIENT TYPE:  INP   LOCATION:  2902                                 FACILITY:  [facility]   PHYSICIAN:  Darlin Priestly, M.D.             DATE OF BIRTH:  20-Jun-1951   DATE OF ADMISSION:  12/31/2002  DATE OF DISCHARGE:  01/03/2003                                 DISCHARGE SUMMARY   ADMISSION DIAGNOSES:  1. Unstable angina.  2. Hypertension.  3. Left ventricular hypertrophy.  4. Back pain.  5. History of right hip deterioration.  6. History of hysterectomy.  7. History of cyst on the right ovary.  8. History of cholecystectomy.  9. History of C5-6 fusion.  10.      History of right shoulder surgery.   DISCHARGE DIAGNOSES:  1. Unstable angina.  2. Hypertension.  3. Left ventricular hypertrophy.  4. Back pain.  5. History of right hip deterioration.  6. History of hysterectomy.  7. History of cyst on the right ovary.  8. History of cholecystectomy.  9. History of C5-6 fusion.  10.      History of right shoulder surgery.  11.      Status post 2-D echocardiogram on January 01, 2003 revealing no     significant abnormalities, normal left ventricular function.  12.      Status post cardiac catheterization, Dr. Susa Griffins, on January 01, 2003 with percutaneous transluminal coronary angioplasty stent to the     ostial right coronary artery using TAXUS stent.  This went from 95%     stenosis to 0% residual.  No other significant coronary artery disease.     Normal left ventricular function, ejection fraction 60%.  A 30% right     renal artery stenosis.   HISTORY OF PRESENT ILLNESS:  [name] is a 60 year old African American  female who presented to the emergency room on December 31, 2002 complaining of  chest pain.  Over the last three days, she had been experiencing increasing  episodes of chest pain associated with fatigue.  This had been occurring a  couple times a day until the day of admission when it became a really bad  pressure in the upper chest and on up into the neck.  As well, she developed  some shortness of breath on the day of admission, became diaphoretic, and  had to change her clothes.  There was no nausea or vomiting.  She says the  chest pressure was severe.  She went to the [facility].  The  discomfort improved with medication and oxygen.  EKG showed LVH.  At the  time of our evaluation at [facility], she was having no chest pain but her  blood pressure was elevated at 194/104.   There were no other significant findings on physical exam.  At that point,  we felt that she was experiencing unstable angina.  We planned to admit,  check serial enzymes, rule out MI; continue IV heparin, IV nitroglycerin.  We would control blood pressure.  We planned for definitive cardiac  catheterization.  We felt that if the chest pain recurred we would need to  start IIb/IIIa.  Dr. Jenne Campus did feel that there was some minimal  inferolateral T-wave changes, as well as the LVH on EKG.   HOSPITAL COURSE:  On January 01, 2003, [name] underwent cardiac  catheterization by Dr. Susa Griffins.  She was found to have a high-  grade ostial RCA with dampening of the lesion at flush.  This was a 95%  stenosis.  He performed cutting balloon followed by TAXUS stenting.  It went  to a 0% residual.  There was no other significant disease in the RCA.  The  LAD had no significant disease.  There were some irregularities proximally.  The circumflex had a 30% stenosis but otherwise the circumflex and the OM  vessels were normal.  Normal LV with EF 60%.  No MR.  There was 2-3+ MAC;  30% right renal artery stenosis, normal left renal artery.  She tolerated  the procedure well and had no complications.  During the procedure, she did  get heparin and also had a bolus followed by infusion of Aggrastat.  Dr.  Alanda Amass planned to continue  Aggrastat for 18 hours.  We would continue  Plavix, as well as proton pump, empirically.   Later on January 01, 2003, she underwent 2-D echocardiogram.  This also revealed  normal LV function and showed no significant valve abnormality.   The following day, [name] remained stable; however, she was complaining  of lightheadedness when sitting up.  No further chest discomfort.  As well,  she did have a peak temperature of 100.1 though it had come down to 98.8 by  the time we evaluated her.  Her blood pressure was improved at 140/50.  She  was maintaining sinus rhythm.  Groin site was healed without hematoma or  bleed.  She had good peripheral pulses. At that point she was evaluated by  Dr Elsie Lincoln.  He planned to continue IV fluids, then ambulate.  We would  discharge her home when her fever resolved and her dizziness resolved.  Orthostatic blood pressures would be drawn.   She was checked later in the evening of January 02, 2003.  At that point, she  was still febrile at 99.  At that point, we ordered a urinalysis and chest x-  ray.  We repeated the orthostatics at that point and they were stable.   On the morning of January 03, 2003, Ms. Arington is without complaint.  Her  dizziness is resolved.  She has had no further chest discomfort and she is  ready to go home.  She is now afebrile at 97.6, pulse is 70, blood pressure  134/48.  Urinalysis is negative.  Groin site remains stable without hematoma  or bleed.  At this point, she is seen and evaluated by Dr. Lavonne Chick, who  deems her stable for discharge home.   CONSULTATIONS:  None.   PROCEDURES:  1. Cardiac catheterization performed on January 01, 2003 by Dr. Susa Griffins.  Please see the dictated report for details.  She was found to     have a high-grade ostial right coronary artery stenosis of 95%.  He     performed angioplasty followed  by TAXUS stenting.  There was no other    significant coronary artery disease.  Normal left  ventricular function,     ejection fraction 60%.  No mitral regurgitation.  She had a 3+ MAC, 30%     right renal artery stenosis but no left renal artery stenosis.  She     tolerated the procedure well and had no complications.  She got an     Aggrastat bolus and we planned to continue infusion for 18 hours and     planned to continue aspirin, Plavix, and proton pump.  2. A 2-D echocardiogram performed on January 01, 2003 shows normal left     ventricular function, ejection fraction 55%-65%.  No significant valve     abnormality.   LABORATORY DATA:  Urinalysis was negative.  TSH 0.416.  Lipid profile showed  total cholesterol 122, triglycerides 56, HDL 28, LDL 81.  Cardiac enzymes  showed CK's of 321, 301, 259, and 225; CK-MB 7.3, 5.5, 4.6, 3.6; troponin  0.10, 0.10, 0.11.  On admission:  Sodium 140, potassium 3.4--this was  ultimately repleted up to 3.9.  Glucose 104, BUN 15, creatinine 1.  The  remainder of these remained stable without significant change.  White count  6.1, hemoglobin 13.6, hematocrit 39.8, platelets 240.  These remained stable  and there was no significant variation at [name].   RADIOLOGY:  Chest x-ray on January 02, 2003 shows cardiomegaly without acute  abnormality.   EKG shows normal sinus rhythm, 71 beats per minute, LVH, and repole changes.  There was some T-wave inversion in I and aVL which could be ischemic versus  secondary to repole.   DISCHARGE MEDICATIONS:  1. Aspirin 81 mg a day.  2. Plavix 75 mg once a day.  3. Avalide 150/12.5 once a day.  4. Protonix 40 mg once a day.  5. Lopressor 50 mg twice a day.  6. Lipitor 40 mg once a day.  7. Nitroglycerin 0.4 mg as directed for chest pain.  8. She was not to use any Atacand.   ACTIVITY:  She was out of work until Wednesday, January 07, 2003.  No strenuous  activity, lifting greater than 5 pounds, driving, or sexual activity for  three days.   DIET:  Low-cholesterol/low-fat diet.   WOUND CARE:  May gently wash  the groin site with warm water and soap.  Call  [phone] for any bleeding or increased redness or pain of the groin site.   FOLLOW UP:  Follow up with Dr. Jenne Campus January 20, 2003 at 3:15.      Mary B. Easley, P.A.-C.                   Darlin Priestly, M.D.    MBE/MEDQ  D:  01/28/2003  T:  01/29/2003  Job:  [id number]   cc:   Milus Mallick. Lodema Hong, M.D.  [address]  [place], [place] [address]  Fax: [phone]

## 2010-12-29 DIAGNOSIS — I219 Acute myocardial infarction, unspecified: Secondary | ICD-10-CM | POA: Insufficient documentation

## 2011-01-31 ENCOUNTER — Encounter: Payer: Self-pay | Admitting: Adult Health

## 2011-01-31 ENCOUNTER — Ambulatory Visit (INDEPENDENT_AMBULATORY_CARE_PROVIDER_SITE_OTHER): Payer: Medicare Other | Admitting: Adult Health

## 2011-01-31 VITALS — BP 131/81 | HR 69 | Ht 67.0 in | Wt 224.0 lb

## 2011-01-31 DIAGNOSIS — E669 Obesity, unspecified: Secondary | ICD-10-CM

## 2011-01-31 DIAGNOSIS — I10 Essential (primary) hypertension: Secondary | ICD-10-CM

## 2011-01-31 DIAGNOSIS — I2510 Atherosclerotic heart disease of native coronary artery without angina pectoris: Secondary | ICD-10-CM

## 2011-01-31 NOTE — Assessment & Plan Note (Signed)
She has lost 24 lbs since last visit and continues to lose. She is maintaining a low fat. Low carbohydrate diet and increasing her activity. I have encouraged her to continue this for more weight loss and the increased health benefits of weight loss on heart disease and hypertension.

## 2011-01-31 NOTE — Patient Instructions (Signed)
**Note De-identified Nikko Goldwire Obfuscation** Your physician recommends that you continue on your current medications as directed. Please refer to the Current Medication list given to you today.  Your physician recommends that you schedule a follow-up appointment in: 1 year  

## 2011-01-31 NOTE — Progress Notes (Signed)
HPI: Mrs. Woodyard is a 60 y/o patient of Dr. Dietrich Pates we are seeing on annual follow-up after having NQWMI in 2004, with implantation of DES to the ostium of the RCA.  She has lost 24 lbs since being seen last on a high protein, low carb diet.  She is followed by Dr. Leandrew Koyanagi and he is following cholesterol levels. He has changed her from simvastatin to pravastatin secondary to amlodipine use.  She has been more active, walking and working in her yard. She became a widow last year and is now living on her own. She has a son who comes by every day. She states she is feeling better. She denies any symptoms of chest pain, shortness of breath, or fatigue. She states her energy is better with the weight loss and plans to lose more weight.  No Known Allergies  Current Outpatient Prescriptions  Medication Sig Dispense Refill  . amLODipine (NORVASC) 10 MG tablet Take 10 mg by mouth daily.        Marland Kitchen aspirin 81 MG tablet Take 81 mg by mouth daily.        Marland Kitchen lisinopril (PRINIVIL,ZESTRIL) 40 MG tablet Take 40 mg by mouth daily.        . pravastatin (PRAVACHOL) 80 MG tablet Take 80 mg by mouth daily.          Past Medical History  Diagnosis Date  . Coronary artery disease     NQWMI 2004; DES to ostium of RCA.  Marland Kitchen Hyperlipidemia   . Hypertension   . GERD (gastroesophageal reflux disease)   . Anxiety and depression   . Arthritis     Past Surgical History  Procedure Date  . Cholestectomy   . Excision of ovarian cyst   . Abdominal hysterectomy   . Cervical disectomy and fusion   . Right shoulder rtc   . L4-5 with pedicle screws and rods     ZOX:WRUEAV of systems complete and found to be negative unless listed above PHYSICAL EXAM BP 131/81  Pulse 69  Ht 5\' 7"  (1.702 m)  Wt 224 lb (101.606 kg)  BMI 35.08 kg/m2  SpO2 96% General: Well developed, well nourished, in no acute distress Head: Eyes PERRLA, No xanthomas.   Normal cephalic and atramatic  Lungs: Clear bilaterally to auscultation and  percussion. Heart: HRRR S1 S2, .  Pulses are 2+ & equal.            No carotid bruit. No JVD.  No abdominal bruits. No femoral bruits. Abdomen: Bowel sounds are positive, abdomen soft and non-tender without masses or                  Hernia's noted. Obese Msk:  Back normal, normal gait. Normal strength and tone for age. Extremities: No clubbing, cyanosis or edema.  DP +1 Neuro: Alert and oriented X 3. Psych:  Good affect, responds appropriately  EKG: NSR rate of 67 bpm.  ASSESSMENT AND PLAN

## 2011-01-31 NOTE — Assessment & Plan Note (Signed)
She is without complaint and stable from a cardiac standpoint. She will need follow-up testing next year for evaluation of progression of CAD. She is asymptomatic at this time and remains active.  Will see in one year unless she becomes symptomatic.

## 2011-02-17 ENCOUNTER — Encounter: Payer: Self-pay | Admitting: Adult Health

## 2011-03-29 ENCOUNTER — Encounter: Payer: Self-pay | Admitting: Adult Health

## 2012-01-25 ENCOUNTER — Other Ambulatory Visit: Payer: Self-pay

## 2012-07-29 DIAGNOSIS — M818 Other osteoporosis without current pathological fracture: Secondary | ICD-10-CM | POA: Insufficient documentation

## 2013-10-30 DIAGNOSIS — E559 Vitamin D deficiency, unspecified: Secondary | ICD-10-CM | POA: Insufficient documentation

## 2014-02-02 DIAGNOSIS — R7301 Impaired fasting glucose: Secondary | ICD-10-CM | POA: Insufficient documentation

## 2014-02-02 DIAGNOSIS — N183 Chronic kidney disease, stage 3 unspecified: Secondary | ICD-10-CM | POA: Insufficient documentation

## 2014-05-20 ENCOUNTER — Encounter: Payer: Medicare Other | Admitting: Cardiology

## 2014-08-03 DIAGNOSIS — E782 Mixed hyperlipidemia: Secondary | ICD-10-CM | POA: Diagnosis not present

## 2014-08-03 DIAGNOSIS — M1990 Unspecified osteoarthritis, unspecified site: Secondary | ICD-10-CM | POA: Diagnosis not present

## 2014-08-03 DIAGNOSIS — N183 Chronic kidney disease, stage 3 (moderate): Secondary | ICD-10-CM | POA: Diagnosis not present

## 2014-08-03 DIAGNOSIS — I10 Essential (primary) hypertension: Secondary | ICD-10-CM | POA: Diagnosis not present

## 2014-08-05 DIAGNOSIS — R209 Unspecified disturbances of skin sensation: Secondary | ICD-10-CM | POA: Insufficient documentation

## 2014-08-06 DIAGNOSIS — Z0001 Encounter for general adult medical examination with abnormal findings: Secondary | ICD-10-CM | POA: Diagnosis not present

## 2014-08-06 DIAGNOSIS — E559 Vitamin D deficiency, unspecified: Secondary | ICD-10-CM | POA: Diagnosis not present

## 2014-08-06 DIAGNOSIS — Z1389 Encounter for screening for other disorder: Secondary | ICD-10-CM | POA: Diagnosis not present

## 2014-08-06 DIAGNOSIS — E782 Mixed hyperlipidemia: Secondary | ICD-10-CM | POA: Diagnosis not present

## 2014-08-06 DIAGNOSIS — N951 Menopausal and female climacteric states: Secondary | ICD-10-CM | POA: Diagnosis not present

## 2014-08-06 DIAGNOSIS — I10 Essential (primary) hypertension: Secondary | ICD-10-CM | POA: Diagnosis not present

## 2014-08-06 DIAGNOSIS — G4700 Insomnia, unspecified: Secondary | ICD-10-CM | POA: Diagnosis not present

## 2014-08-06 DIAGNOSIS — Z23 Encounter for immunization: Secondary | ICD-10-CM | POA: Diagnosis not present

## 2014-08-13 DIAGNOSIS — Z1231 Encounter for screening mammogram for malignant neoplasm of breast: Secondary | ICD-10-CM | POA: Diagnosis not present

## 2014-08-13 DIAGNOSIS — R928 Other abnormal and inconclusive findings on diagnostic imaging of breast: Secondary | ICD-10-CM | POA: Diagnosis not present

## 2014-08-20 DIAGNOSIS — M1990 Unspecified osteoarthritis, unspecified site: Secondary | ICD-10-CM | POA: Diagnosis not present

## 2014-08-20 DIAGNOSIS — Z79899 Other long term (current) drug therapy: Secondary | ICD-10-CM | POA: Diagnosis not present

## 2014-08-20 DIAGNOSIS — Z7982 Long term (current) use of aspirin: Secondary | ICD-10-CM | POA: Diagnosis not present

## 2014-08-20 DIAGNOSIS — M85851 Other specified disorders of bone density and structure, right thigh: Secondary | ICD-10-CM | POA: Diagnosis not present

## 2014-08-20 DIAGNOSIS — M8589 Other specified disorders of bone density and structure, multiple sites: Secondary | ICD-10-CM | POA: Diagnosis not present

## 2014-08-20 DIAGNOSIS — M85832 Other specified disorders of bone density and structure, left forearm: Secondary | ICD-10-CM | POA: Diagnosis not present

## 2014-08-20 DIAGNOSIS — Z780 Asymptomatic menopausal state: Secondary | ICD-10-CM | POA: Diagnosis not present

## 2014-08-20 DIAGNOSIS — E119 Type 2 diabetes mellitus without complications: Secondary | ICD-10-CM | POA: Diagnosis not present

## 2014-08-20 DIAGNOSIS — M85852 Other specified disorders of bone density and structure, left thigh: Secondary | ICD-10-CM | POA: Diagnosis not present

## 2014-08-26 DIAGNOSIS — R922 Inconclusive mammogram: Secondary | ICD-10-CM | POA: Diagnosis not present

## 2014-08-26 DIAGNOSIS — R6889 Other general symptoms and signs: Secondary | ICD-10-CM | POA: Diagnosis not present

## 2014-08-26 DIAGNOSIS — R928 Other abnormal and inconclusive findings on diagnostic imaging of breast: Secondary | ICD-10-CM | POA: Diagnosis not present

## 2014-11-09 DIAGNOSIS — R6889 Other general symptoms and signs: Secondary | ICD-10-CM | POA: Diagnosis not present

## 2015-01-28 DIAGNOSIS — E782 Mixed hyperlipidemia: Secondary | ICD-10-CM | POA: Diagnosis not present

## 2015-01-28 DIAGNOSIS — E559 Vitamin D deficiency, unspecified: Secondary | ICD-10-CM | POA: Diagnosis not present

## 2015-01-28 DIAGNOSIS — R6889 Other general symptoms and signs: Secondary | ICD-10-CM | POA: Diagnosis not present

## 2015-01-28 DIAGNOSIS — N183 Chronic kidney disease, stage 3 (moderate): Secondary | ICD-10-CM | POA: Diagnosis not present

## 2015-01-28 DIAGNOSIS — I10 Essential (primary) hypertension: Secondary | ICD-10-CM | POA: Diagnosis not present

## 2015-02-03 DIAGNOSIS — R7301 Impaired fasting glucose: Secondary | ICD-10-CM | POA: Diagnosis not present

## 2015-02-03 DIAGNOSIS — M1990 Unspecified osteoarthritis, unspecified site: Secondary | ICD-10-CM | POA: Diagnosis not present

## 2015-02-03 DIAGNOSIS — N951 Menopausal and female climacteric states: Secondary | ICD-10-CM | POA: Diagnosis not present

## 2015-02-03 DIAGNOSIS — E559 Vitamin D deficiency, unspecified: Secondary | ICD-10-CM | POA: Diagnosis not present

## 2015-02-03 DIAGNOSIS — R6889 Other general symptoms and signs: Secondary | ICD-10-CM | POA: Diagnosis not present

## 2015-02-03 DIAGNOSIS — G4700 Insomnia, unspecified: Secondary | ICD-10-CM | POA: Diagnosis not present

## 2015-02-03 DIAGNOSIS — I10 Essential (primary) hypertension: Secondary | ICD-10-CM | POA: Diagnosis not present

## 2015-02-03 DIAGNOSIS — E782 Mixed hyperlipidemia: Secondary | ICD-10-CM | POA: Diagnosis not present

## 2015-03-25 ENCOUNTER — Encounter: Payer: Self-pay | Admitting: Cardiology

## 2015-03-25 DIAGNOSIS — R071 Chest pain on breathing: Secondary | ICD-10-CM | POA: Diagnosis not present

## 2015-03-25 DIAGNOSIS — R6889 Other general symptoms and signs: Secondary | ICD-10-CM | POA: Diagnosis not present

## 2015-03-26 ENCOUNTER — Encounter: Payer: Self-pay | Admitting: Cardiology

## 2015-03-26 ENCOUNTER — Encounter: Payer: Self-pay | Admitting: *Deleted

## 2015-03-26 ENCOUNTER — Ambulatory Visit (INDEPENDENT_AMBULATORY_CARE_PROVIDER_SITE_OTHER): Payer: Commercial Managed Care - HMO | Admitting: Cardiology

## 2015-03-26 VITALS — BP 131/85 | HR 73 | Ht 67.0 in | Wt 234.8 lb

## 2015-03-26 DIAGNOSIS — I25119 Atherosclerotic heart disease of native coronary artery with unspecified angina pectoris: Secondary | ICD-10-CM

## 2015-03-26 DIAGNOSIS — E785 Hyperlipidemia, unspecified: Secondary | ICD-10-CM

## 2015-03-26 DIAGNOSIS — I252 Old myocardial infarction: Secondary | ICD-10-CM

## 2015-03-26 DIAGNOSIS — N183 Chronic kidney disease, stage 3 unspecified: Secondary | ICD-10-CM

## 2015-03-26 DIAGNOSIS — I10 Essential (primary) hypertension: Secondary | ICD-10-CM

## 2015-03-26 DIAGNOSIS — R0609 Other forms of dyspnea: Secondary | ICD-10-CM

## 2015-03-26 DIAGNOSIS — R9431 Abnormal electrocardiogram [ECG] [EKG]: Secondary | ICD-10-CM

## 2015-03-26 NOTE — Patient Instructions (Signed)
Your physician recommends that you continue on your current medications as directed. Please refer to the Current Medication list given to you today. Your physician has requested that you have an echocardiogram. Echocardiography is a painless test that uses sound waves to create images of your heart. It provides your doctor with information about the size and shape of your heart and how well your heart's chambers and valves are working. This procedure takes approximately one hour. There are no restrictions for this procedure. Your physician has requested that you have a lexiscan myoview. For further information please visit [email or web address] Please follow instruction sheet, as given. Your physician recommends that you schedule a follow-up appointment in: 6 months. You will receive a reminder letter in the mail in about 4 months reminding you to call and schedule your appointment. If you don't receive this letter, please contact our office. We will call you with the results of your tests.

## 2015-03-26 NOTE — Progress Notes (Signed)
Cardiology Office Note  Date: 03/26/2015   [name] [name] [birth year], MRN [id number]  PCP: Juliette Alcide, MD  Consulting Cardiologist: Nona Dell, MD   Chief Complaint  Patient presents with  . Coronary Artery Disease    History of Present Illness: [name] [name] is a 64 y.o. female referred to the office by Dr. Leandrew Koyanagi for cardiology evaluation. She is a former patient of Dr. Dietrich Pates, not seen in the office since 2012. I reviewed her records and updated the chart below.  Cardiac history includes NSTEMI in 2004 treated with placement of DES to the ostial RCA by Dr. Alanda Amass. Left coronary system had only mild atherosclerosis predominantly within the obtuse marginals.  She states that over the last year she has noted a general decrease in her stamina, dyspnea on exertion, has to rest when doing house work. She does not endorse any frank chest tightness however. Medications are outlined below, she reports compliance. Her ECG today shows sinus rhythm, small Q waves in the inferior leads, possible old inferior infarct pattern but equivocal.  She has not had any ischemic or structural cardiac testing in the last several years.   Past Medical History  Diagnosis Date  . Coronary artery disease     NSTEMI 2004, DES to ostial RCA  . Hyperlipidemia   . Essential hypertension   . GERD (gastroesophageal reflux disease)   . Anxiety and depression   . Arthritis   . Constipation   . Pulmonary nodule   . DDD (degenerative disc disease)   . Carpal tunnel syndrome   . IBS (irritable bowel syndrome)   . Low back pain   . Type 2 diabetes mellitus   . Postmenopausal   . Chronic kidney disease (CKD), stage II (mild)     Past Surgical History  Procedure Laterality Date  . Cholestectomy    . Excision of ovarian cyst    . Abdominal hysterectomy    . Cervical disectomy and fusion    . Right shoulder rtc    . L4-5 with pedicle screws and rods      Current Outpatient  Prescriptions  Medication Sig Dispense Refill  . amLODipine (NORVASC) 10 MG tablet Take 10 mg by mouth daily.      Marland Kitchen aspirin 81 MG tablet Take 81 mg by mouth daily.      . Cholecalciferol (VITAMIN D3) 2000 UNITS TABS Take 1 tablet by mouth daily.    . hydrochlorothiazide (HYDRODIURIL) 25 MG tablet Take 25 mg by mouth daily.      Marland Kitchen lisinopril (PRINIVIL,ZESTRIL) 40 MG tablet Take 40 mg by mouth daily.      . metFORMIN (GLUCOPHAGE) 500 MG tablet Take 1 tablet by mouth 2 (two) times daily.    . pravastatin (PRAVACHOL) 80 MG tablet Take 80 mg by mouth daily.       No current facility-administered medications for this visit.    Allergies:  Review of patient's allergies indicates no known allergies.   Social History: The patient  reports that she has never smoked. She has never used smokeless tobacco. She reports that she does not drink alcohol or use illicit drugs.   Family History: The patient's family history includes Diabetes Mellitus II in her brother, mother, and sister; Hypertension in her brother; Kidney disease in her father; Stroke in her mother.   ROS:  Please see the history of present illness. Otherwise, complete review of systems is positive for occasional palpitations, no syncope.  All other  systems are reviewed and negative.   Physical Exam: VS:  BP 131/85 mmHg  Pulse 73  Ht  (1.702 m)  Wt 234 lb 12.8 oz (106.505 kg)  BMI 36.77 kg/m2  SpO2 97%, BMI Body mass index is 36.77 kg/(m^2).  Wt Readings from Last 3 Encounters:  03/26/15 234 lb 12.8 oz (106.505 kg)  01/31/11 224 lb (101.606 kg)  02/11/10 248 lb (112.492 kg)     General: Overweight woman, appears comfortable at rest. HEENT: Conjunctiva and lids normal, oropharynx clear. Neck: Supple, no elevated JVP or carotid bruits, no thyromegaly. Lungs: Clear to auscultation, nonlabored breathing at rest. Cardiac: Regular rate and rhythm, no S3 or significant systolic murmur, no pericardial rub. Abdomen: Soft,  nontender, bowel sounds present, no guarding or rebound. Extremities: No pitting edema, distal pulses 2+. Skin: Warm and dry. Musculoskeletal: No kyphosis. Neuropsychiatric: Alert and oriented x3, affect grossly appropriate.   ECG: Recent tracing from 03/25/2015 showed sinus rhythm with Q-wave in lead 3, nonspecific ST changes.   Recent Labwork:  July 2013: BUN 22, creatinine 1.1, potassium 3.9, AST 23, ALT 17, hemoglobin 12.6, platelets 259, cholesterol 202, triglycerides 126, HDL 59, LDL 118.  Other Studies Reviewed Today:  Echocardiogram 01/01/2003: SUMMARY - Overall left ventricular systolic function was normal. Left    ventricular ejection fraction was estimated , range being 55    % to 65 %. Left ventricular wall thickness was mildly    increased. - There was minimal calcification of the mitral valve, involving    the posterior leaflet. There was mild mitral annular    calcification.  Assessment and Plan:  1. Decreased stamina and dyspnea on exertion in the setting of known ischemic heart disease. ECG is overall nonspecific, probable old inferior infarct pattern noted. She has a history of DES to the ostial RCA in 2004, no recent ischemic or structural cardiac evaluation noted. Plan is to proceed with an echocardiogram and Lexiscan Cardiolite for reassessment assessment.  2. Essential hypertension, blood pressure is reasonably controlled today.  3. Hyperlipidemia, on Pravachol. She has follow-up with Dr. Leandrew Koyanagi.  4. History of CKD stage II.  Current medicines were reviewed with the patient today.   Orders Placed This Encounter  Procedures  . NM Myocar Multi W/Spect W/Wall Motion / EF  . Myocardial Perfusion Imaging  . EKG 12-Lead  . ECHOCARDIOGRAM COMPLETE    Disposition: FU with me in 6 months.   Signed, Jonelle Sidle, MD, [facility] 03/26/2015 9:47 AM    [facility] at [facility] [address] [place], [place], [place]  [address] Phone: [phone]; Fax: [phone]

## 2015-04-01 ENCOUNTER — Encounter (HOSPITAL_COMMUNITY): Payer: Self-pay

## 2015-04-01 ENCOUNTER — Encounter (HOSPITAL_COMMUNITY)
Admission: RE | Admit: 2015-04-01 | Discharge: 2015-04-01 | Disposition: A | Discharge status: Home / Self Care | Payer: Commercial Managed Care - HMO | Source: Ambulatory Visit | Attending: Cardiology | Admitting: Cardiology

## 2015-04-01 ENCOUNTER — Inpatient Hospital Stay (HOSPITAL_COMMUNITY): Admission: RE | Admit: 2015-04-01 | Payer: Commercial Managed Care - HMO | Source: Ambulatory Visit

## 2015-04-01 ENCOUNTER — Ambulatory Visit (HOSPITAL_COMMUNITY)
Admission: RE | Admit: 2015-04-01 | Discharge: 2015-04-01 | Disposition: A | Discharge status: Home / Self Care | Payer: Commercial Managed Care - HMO | Source: Ambulatory Visit | Attending: Cardiology | Admitting: Cardiology

## 2015-04-01 DIAGNOSIS — I2510 Atherosclerotic heart disease of native coronary artery without angina pectoris: Secondary | ICD-10-CM | POA: Diagnosis not present

## 2015-04-01 DIAGNOSIS — R0609 Other forms of dyspnea: Secondary | ICD-10-CM

## 2015-04-01 DIAGNOSIS — R6889 Other general symptoms and signs: Secondary | ICD-10-CM | POA: Diagnosis not present

## 2015-04-01 DIAGNOSIS — I25119 Atherosclerotic heart disease of native coronary artery with unspecified angina pectoris: Secondary | ICD-10-CM | POA: Insufficient documentation

## 2015-04-01 DIAGNOSIS — I10 Essential (primary) hypertension: Secondary | ICD-10-CM | POA: Diagnosis not present

## 2015-04-01 DIAGNOSIS — E119 Type 2 diabetes mellitus without complications: Secondary | ICD-10-CM | POA: Diagnosis not present

## 2015-04-01 DIAGNOSIS — R9439 Abnormal result of other cardiovascular function study: Secondary | ICD-10-CM | POA: Insufficient documentation

## 2015-04-01 LAB — NM MYOCAR MULTI W/SPECT W/WALL MOTION / EF
CSEPPHR: 90 {beats}/min
Rest HR: 62 {beats}/min

## 2015-04-01 MED ORDER — REGADENOSON 0.4 MG/5ML IV SOLN
INTRAVENOUS | Status: AC
  Administered 2015-04-01: 0.4 mg
  Filled 2015-04-01: qty 5

## 2015-04-01 MED ORDER — TECHNETIUM TC 99M SESTAMIBI GENERIC - CARDIOLITE
30.0000 | Freq: Once | INTRAVENOUS | Status: AC | PRN
  Administered 2015-04-01: 30 via INTRAVENOUS

## 2015-04-01 MED ORDER — TECHNETIUM TC 99M SESTAMIBI - CARDIOLITE
10.0000 | Freq: Once | INTRAVENOUS | Status: AC | PRN
  Administered 2015-04-01: 9 via INTRAVENOUS

## 2015-04-02 ENCOUNTER — Telehealth: Payer: Self-pay | Admitting: *Deleted

## 2015-04-02 NOTE — Telephone Encounter (Signed)
Patient informed and verbalized understanding of plan. 

## 2015-04-02 NOTE — Telephone Encounter (Signed)
-----   Message from Jonelle Sidle, MD sent at 04/01/2015 11:33 AM EDT ----- Reviewed report. Although low risk overall, the study was abnormal suggesting ischemia that would suggest some progression in CAD since her evaluation back in 2004. We had scheduled her for a 6 month routine follow-up, please move this visit up to the next few weeks so that we can discuss whether we should pursue a cardiac catheterization to further evaluate her symptoms, or try and adjust medical therapy.

## 2015-04-02 NOTE — Telephone Encounter (Signed)
-----   Message from Jonelle Sidle, MD sent at 04/01/2015  1:56 PM EDT ----- Reviewed. LVEF normal range. Valvular calcification noted, however not of major clinical significance at this time.

## 2015-04-02 NOTE — Telephone Encounter (Signed)
Returned your call.

## 2015-04-20 ENCOUNTER — Encounter: Payer: Self-pay | Admitting: *Deleted

## 2015-04-20 ENCOUNTER — Telehealth: Payer: Self-pay | Admitting: Cardiology

## 2015-04-20 ENCOUNTER — Other Ambulatory Visit: Payer: Self-pay | Admitting: Cardiology

## 2015-04-20 ENCOUNTER — Ambulatory Visit (INDEPENDENT_AMBULATORY_CARE_PROVIDER_SITE_OTHER): Payer: Commercial Managed Care - HMO | Admitting: Cardiology

## 2015-04-20 ENCOUNTER — Encounter: Payer: Self-pay | Admitting: Cardiology

## 2015-04-20 VITALS — BP 138/90 | HR 73 | Ht 67.0 in | Wt 233.0 lb

## 2015-04-20 DIAGNOSIS — R0609 Other forms of dyspnea: Secondary | ICD-10-CM | POA: Diagnosis not present

## 2015-04-20 DIAGNOSIS — I10 Essential (primary) hypertension: Secondary | ICD-10-CM | POA: Diagnosis not present

## 2015-04-20 DIAGNOSIS — N182 Chronic kidney disease, stage 2 (mild): Secondary | ICD-10-CM

## 2015-04-20 DIAGNOSIS — I25119 Atherosclerotic heart disease of native coronary artery with unspecified angina pectoris: Secondary | ICD-10-CM | POA: Diagnosis not present

## 2015-04-20 DIAGNOSIS — E785 Hyperlipidemia, unspecified: Secondary | ICD-10-CM

## 2015-04-20 DIAGNOSIS — R9439 Abnormal result of other cardiovascular function study: Secondary | ICD-10-CM

## 2015-04-20 NOTE — Patient Instructions (Addendum)
Your physician recommends that you continue on your current medications as directed. Please refer to the Current Medication list given to you today. Your physician has requested that you have a cardiac catheterization. Cardiac catheterization is used to diagnose and/or treat various heart conditions. Doctors may recommend this procedure for a number of different reasons. The most common reason is to evaluate chest pain. Chest pain can be a symptom of coronary artery disease (CAD), and cardiac catheterization can show whether plaque is narrowing or blocking your heart's arteries. This procedure is also used to evaluate the valves, as well as measure the blood flow and oxygen levels in different parts of your heart. For further information please visit [email or web address]. Please follow instruction sheet, as given. Your physician recommends that you schedule a follow-up appointment in: 1 month. 

## 2015-04-20 NOTE — Progress Notes (Signed)
  Cardiology Office Note  Date: 04/20/2015   ID: Miller M Ericsson, DOB 02/11/1951, MRN [id number]  PCP: BURDINE,STEVEN E, MD  Primary Cardiologist: Samuel McDowell, MD   Chief Complaint  Patient presents with  . Follow-up testing  . Coronary Artery Disease    History of Present Illness: [name] [name] is a 64 y.o. female seen in consultation in September with symptoms including decreased stamina and shortness of breath in the setting of known ischemic heart disease. Follow-up echocardiogram and Lexiscan Cardiolite were arranged, results outlined below.  She comes in today to discuss the results. LVEF is normal with overall mild diastolic dysfunction in no major valvular heart disease. She did have a region of ischemia, moderate sized within the basal anteroseptal, inferoseptal, and mid inferoseptal myocardium. She continues to report dyspnea on exertion with basic ADLs, has to rest more frequently.  It has been 12 years since she underwent placement of DES to ostial RCA, and quite likely that she has had progressive CAD, particular with type 2 diabetes mellitus and hypertension.  We discussed options for further management including medication adjustments versus proceeding to a diagnostic cardiac catheterization to evaluate for revascularization strategies. She is inclined to pursue cardiac catheterization at this time in light of her current symptoms. We discussed the risks and benefits. She is in agreement to proceed.   Past Medical History  Diagnosis Date  . Coronary artery disease     NSTEMI 2004, DES to ostial RCA  . Hyperlipidemia   . Essential hypertension   . GERD (gastroesophageal reflux disease)   . Anxiety and depression   . Arthritis   . Constipation   . Pulmonary nodule   . DDD (degenerative disc disease)   . Carpal tunnel syndrome   . IBS (irritable bowel syndrome)   . Low back pain   . Type 2 diabetes mellitus (HCC)   . Postmenopausal   . Chronic kidney  disease (CKD), stage II (mild)     Past Surgical History  Procedure Laterality Date  . Cholestectomy    . Excision of ovarian cyst    . Abdominal hysterectomy    . Cervical disectomy and fusion    . Right shoulder rtc    . L4-5 with pedicle screws and rods      Current Outpatient Prescriptions  Medication Sig Dispense Refill  . amLODipine (NORVASC) 10 MG tablet Take 10 mg by mouth daily.      . aspirin 81 MG tablet Take 81 mg by mouth daily.      . Cholecalciferol (VITAMIN D3) 2000 UNITS TABS Take 1 tablet by mouth daily.    . hydrochlorothiazide (HYDRODIURIL) 25 MG tablet Take 25 mg by mouth daily.      . lisinopril (PRINIVIL,ZESTRIL) 40 MG tablet Take 40 mg by mouth daily.      . metFORMIN (GLUCOPHAGE) 500 MG tablet Take 1 tablet by mouth 2 (two) times daily.    . pravastatin (PRAVACHOL) 80 MG tablet Take 80 mg by mouth daily.       No current facility-administered medications for this visit.    Allergies:  Review of patient's allergies indicates no known allergies.   Social History: The patient  reports that she has never smoked. She has never used smokeless tobacco. She reports that she does not drink alcohol or use illicit drugs.   ROS:  Please see the history of present illness. Otherwise, complete review of systems is positive for back pain and leg pain.  All   other systems are reviewed and negative.   Physical Exam: VS:  BP 138/90 mmHg  Pulse 73  Ht 5\' 7"  (1.702 m)  Wt 233 lb (105.688 kg)  BMI 36.48 kg/m2  SpO2 97%, BMI Body mass index is 36.48 kg/(m^2).  Wt Readings from Last 3 Encounters:  04/20/15 233 lb (105.688 kg)  03/26/15 234 lb 12.8 oz (106.505 kg)  01/31/11 224 lb (101.606 kg)     General: Overweight woman, appears comfortable at rest. HEENT: Conjunctiva and lids normal, oropharynx clear. Neck: Supple, no elevated JVP or carotid bruits, no thyromegaly. Lungs: Clear to auscultation, nonlabored breathing at rest. Cardiac: Regular rate and rhythm, no S3  or significant systolic murmur, no pericardial rub. Abdomen: Soft, nontender, bowel sounds present, no guarding or rebound. Extremities: No pitting edema, distal pulses 2+. Skin: Warm and dry. Musculoskeletal: No kyphosis. Neuropsychiatric: Alert and oriented x3, affect grossly appropriate.   ECG: ECG is not ordered today.  Other Studies Reviewed Today:  Echocardiogram 04/01/2015: Study Conclusions  - Left ventricle: The cavity size was normal. Wall thickness was increased in a pattern of mild LVH. Systolic function was vigorous. The estimated ejection fraction was in the range of 65% to 70%. Wall motion was normal; there were no regional wall motion abnormalities. Doppler parameters are consistent with abnormal left ventricular relaxation (grade 1 diastolic dysfunction). - Aortic valve: Trileaflet; mildly calcified leaflets. Left coronary cusp mobility was mildly restricted. - Mitral valve: Heavily calcified annulus. There was trivial regurgitation. - Left atrium: The atrium was mildly dilated. - Right atrium: Central venous pressure (est): 3 mm Hg. - Atrial septum: No defect or patent foramen ovale was identified. - Tricuspid valve: There was trivial regurgitation. - Pulmonary arteries: PA peak pressure: 25 mm Hg (S). - Pericardium, extracardiac: There was no pericardial effusion.  Impressions:  - Mild LVH with LVEF 65-70% and grade 1 diastolic dysfunction. Mild left atrial enlargement. Heavily calcified mitral annulus with trivial mitral regurgitation. Mildly sclerotic aortic valve. Trivial tricuspid regurgitation with PASP 25 mmHg.  Lexiscan Cardiolite 04/01/2015:  There was no ST segment deviation noted during stress.  Defect 1: There is a medium defect of moderate severity present in the basal anteroseptal, basal inferoseptal and mid inferoseptal location.  Findings suggestive of ischemia. However, variable soft tissue attenuation artifact  cannot entirely be ruled out.  Low risk study overall.  Nuclear stress EF: 61%.  Assessment and Plan:  1. Limited stamina and worsening dyspnea and exertion in the setting of ischemic heart disease.  She has a history of NSTEMI with DES to the ostial RCA in 2004. Cardiolite study is abnormal as outlined above with moderate region of ischemia in the basal anteroseptal, basal inferoseptal, and mid inferoseptal myocardium. LVEF normal range with mild diastolic dysfunction. We have discussed her symptoms and the test results. Plan is to proceed with a diagnostic cardiac catheterization to clarify coronary anatomy and assess for revascularization options.  2. Essential hypertension , no changes made to current regimen.  3. Hyperlipidemia, on Pravachol.  4. History of CKD stage II. Last creatinine was 1.2.  Current medicines were reviewed with the patient today.  No orders of the defined types were placed in this encounter.    Disposition: FU with me in 1 month.   Signed, Jonelle SidleSamuel G. McDowell, MD, [facility] Center[facility] 04/20/2015 9:11 AM    [facility] [address], [place], [place] [address] Phone: [phone]; Fax: [phone]) [phone]

## 2015-04-20 NOTE — Telephone Encounter (Signed)
Left heart cath Wednesday April 28, 2015 @ with Dr. Excell Seltzerooper dx: abnormal cardiolite & CAD & SOB Checking percert

## 2015-04-21 NOTE — Telephone Encounter (Signed)
CH HAS PENDING

## 2015-04-23 NOTE — Patient Outreach (Signed)
[facility]) Care Management  04/23/2015  [name] [name] [birth year] [id number]   Referral from [facility], assigned Colleen CanLinda Manning, RN to outreach for [facility] services.  Thanks, Corrie MckusickLisa O. Sharlee BlewMoore, [facility] Care Management [facility] CM Assistant Phone: [phone] Fax: [phone]

## 2015-04-26 NOTE — Telephone Encounter (Signed)
[employer] [facility] WUJW#[id number]#[id number] expires 10-17-15

## 2015-04-28 ENCOUNTER — Ambulatory Visit (HOSPITAL_COMMUNITY)
Admission: RE | Admit: 2015-04-28 | Discharge: 2015-04-28 | Disposition: A | Discharge status: Home / Self Care | Payer: Commercial Managed Care - HMO | Source: Ambulatory Visit | Attending: Cardiovascular Disease | Admitting: Cardiovascular Disease

## 2015-04-28 ENCOUNTER — Encounter (HOSPITAL_COMMUNITY)
Admission: RE | Disposition: A | Discharge status: Home / Self Care | Payer: Self-pay | Source: Ambulatory Visit | Attending: Cardiovascular Disease

## 2015-04-28 DIAGNOSIS — N182 Chronic kidney disease, stage 2 (mild): Secondary | ICD-10-CM | POA: Diagnosis not present

## 2015-04-28 DIAGNOSIS — E663 Overweight: Secondary | ICD-10-CM | POA: Diagnosis not present

## 2015-04-28 DIAGNOSIS — Z7984 Long term (current) use of oral hypoglycemic drugs: Secondary | ICD-10-CM | POA: Insufficient documentation

## 2015-04-28 DIAGNOSIS — Z7982 Long term (current) use of aspirin: Secondary | ICD-10-CM | POA: Diagnosis not present

## 2015-04-28 DIAGNOSIS — F418 Other specified anxiety disorders: Secondary | ICD-10-CM | POA: Diagnosis not present

## 2015-04-28 DIAGNOSIS — I2510 Atherosclerotic heart disease of native coronary artery without angina pectoris: Secondary | ICD-10-CM | POA: Insufficient documentation

## 2015-04-28 DIAGNOSIS — E785 Hyperlipidemia, unspecified: Secondary | ICD-10-CM | POA: Diagnosis not present

## 2015-04-28 DIAGNOSIS — M1990 Unspecified osteoarthritis, unspecified site: Secondary | ICD-10-CM | POA: Insufficient documentation

## 2015-04-28 DIAGNOSIS — I129 Hypertensive chronic kidney disease with stage 1 through stage 4 chronic kidney disease, or unspecified chronic kidney disease: Secondary | ICD-10-CM | POA: Insufficient documentation

## 2015-04-28 DIAGNOSIS — K589 Irritable bowel syndrome without diarrhea: Secondary | ICD-10-CM | POA: Diagnosis not present

## 2015-04-28 DIAGNOSIS — E1122 Type 2 diabetes mellitus with diabetic chronic kidney disease: Secondary | ICD-10-CM | POA: Diagnosis not present

## 2015-04-28 DIAGNOSIS — K219 Gastro-esophageal reflux disease without esophagitis: Secondary | ICD-10-CM | POA: Insufficient documentation

## 2015-04-28 DIAGNOSIS — R9439 Abnormal result of other cardiovascular function study: Secondary | ICD-10-CM

## 2015-04-28 DIAGNOSIS — I252 Old myocardial infarction: Secondary | ICD-10-CM | POA: Insufficient documentation

## 2015-04-28 DIAGNOSIS — Z6836 Body mass index (BMI) 36.0-36.9, adult: Secondary | ICD-10-CM | POA: Diagnosis not present

## 2015-04-28 DIAGNOSIS — Z955 Presence of coronary angioplasty implant and graft: Secondary | ICD-10-CM | POA: Insufficient documentation

## 2015-04-28 DIAGNOSIS — R0602 Shortness of breath: Secondary | ICD-10-CM

## 2015-04-28 HISTORY — PX: CARDIAC CATHETERIZATION: SHX172

## 2015-04-28 LAB — BASIC METABOLIC PANEL
ANION GAP: 11 (ref 5–15)
BUN: 14 mg/dL (ref 6–20)
CHLORIDE: 100 mmol/L — AB (ref 101–111)
CO2: 28 mmol/L (ref 22–32)
Calcium: 10.4 mg/dL — ABNORMAL HIGH (ref 8.9–10.3)
Creatinine, Ser: 1.15 mg/dL — ABNORMAL HIGH (ref 0.44–1.00)
GFR calc non Af Amer: 49 mL/min — ABNORMAL LOW (ref >60)
GFR, EST AFRICAN AMERICAN: 57 mL/min — AB (ref >60)
Glucose, Bld: 116 mg/dL — ABNORMAL HIGH (ref 65–99)
Potassium: 3.3 mmol/L — ABNORMAL LOW (ref 3.5–5.1)
Sodium: 139 mmol/L (ref 135–145)

## 2015-04-28 LAB — PROTIME-INR
INR: 1.01 (ref 0.00–1.49)
Prothrombin Time: 13.5 seconds (ref 11.6–15.2)

## 2015-04-28 LAB — GLUCOSE, CAPILLARY: Glucose-Capillary: 109 mg/dL — ABNORMAL HIGH (ref 65–99)

## 2015-04-28 LAB — CBC
HEMATOCRIT: 38.7 % (ref 36.0–46.0)
HEMOGLOBIN: 13 g/dL (ref 12.0–15.0)
MCH: 30.4 pg (ref 26.0–34.0)
MCHC: 33.6 g/dL (ref 30.0–36.0)
MCV: 90.6 fL (ref 78.0–100.0)
Platelets: 234 10*3/uL (ref 150–400)
RBC: 4.27 MIL/uL (ref 3.87–5.11)
RDW: 13.2 % (ref 11.5–15.5)
WBC: 7.6 10*3/uL (ref 4.0–10.5)

## 2015-04-28 SURGERY — LEFT HEART CATH AND CORONARY ANGIOGRAPHY

## 2015-04-28 MED ORDER — SODIUM CHLORIDE 0.9 % IJ SOLN: 3.0000 mL | Freq: Two times a day (BID) | INTRAMUSCULAR | Status: DC

## 2015-04-28 MED ORDER — MIDAZOLAM HCL 2 MG/2ML IJ SOLN
INTRAMUSCULAR | Status: AC
  Filled 2015-04-28: qty 4

## 2015-04-28 MED ORDER — FENTANYL CITRATE (PF) 100 MCG/2ML IJ SOLN
INTRAMUSCULAR | Status: DC | PRN
  Administered 2015-04-28 (×2): 25 ug via INTRAVENOUS

## 2015-04-28 MED ORDER — NITROGLYCERIN 1 MG/10 ML FOR IR/CATH LAB
INTRA_ARTERIAL | Status: AC
  Filled 2015-04-28: qty 10

## 2015-04-28 MED ORDER — FENTANYL CITRATE (PF) 100 MCG/2ML IJ SOLN
INTRAMUSCULAR | Status: AC
  Filled 2015-04-28: qty 4

## 2015-04-28 MED ORDER — VERAPAMIL HCL 2.5 MG/ML IV SOLN
INTRAVENOUS | Status: DC | PRN
  Administered 2015-04-28: 11:00:00 via INTRA_ARTERIAL

## 2015-04-28 MED ORDER — SODIUM CHLORIDE 0.9 % IV SOLN
INTRAVENOUS | Status: DC
  Administered 2015-04-28: 08:00:00 via INTRAVENOUS

## 2015-04-28 MED ORDER — ONDANSETRON HCL 4 MG/2ML IJ SOLN: 4.0000 mg | Freq: Four times a day (QID) | INTRAMUSCULAR | Status: DC | PRN

## 2015-04-28 MED ORDER — HEPARIN SODIUM (PORCINE) 1000 UNIT/ML IJ SOLN
INTRAMUSCULAR | Status: AC
  Filled 2015-04-28: qty 1

## 2015-04-28 MED ORDER — SODIUM CHLORIDE 0.9 % IJ SOLN: 3.0000 mL | INTRAMUSCULAR | Status: DC | PRN

## 2015-04-28 MED ORDER — ASPIRIN 81 MG PO CHEW: 81.0000 mg | CHEWABLE_TABLET | ORAL | Status: DC

## 2015-04-28 MED ORDER — SODIUM CHLORIDE 0.9 % IV SOLN: INTRAVENOUS | Status: AC

## 2015-04-28 MED ORDER — HEPARIN SODIUM (PORCINE) 1000 UNIT/ML IJ SOLN
INTRAMUSCULAR | Status: DC | PRN
  Administered 2015-04-28: 5000 [IU] via INTRAVENOUS

## 2015-04-28 MED ORDER — SODIUM CHLORIDE 0.9 % IV SOLN: 250.0000 mL | INTRAVENOUS | Status: DC | PRN

## 2015-04-28 MED ORDER — VERAPAMIL HCL 2.5 MG/ML IV SOLN
INTRAVENOUS | Status: AC
  Filled 2015-04-28: qty 2

## 2015-04-28 MED ORDER — NITROGLYCERIN 1 MG/10 ML FOR IR/CATH LAB
INTRA_ARTERIAL | Status: DC | PRN
  Administered 2015-04-28: 12:00:00

## 2015-04-28 MED ORDER — HEPARIN (PORCINE) IN NACL 2-0.9 UNIT/ML-% IJ SOLN
INTRAMUSCULAR | Status: AC
  Filled 2015-04-28: qty 1000

## 2015-04-28 MED ORDER — MIDAZOLAM HCL 2 MG/2ML IJ SOLN
INTRAMUSCULAR | Status: DC | PRN
  Administered 2015-04-28 (×2): 1 mg via INTRAVENOUS

## 2015-04-28 MED ORDER — LIDOCAINE HCL (PF) 1 % IJ SOLN
INTRAMUSCULAR | Status: AC
  Filled 2015-04-28: qty 30

## 2015-04-28 MED ORDER — ACETAMINOPHEN 325 MG PO TABS: 650.0000 mg | ORAL_TABLET | ORAL | Status: DC | PRN

## 2015-04-28 SURGICAL SUPPLY — 11 items

## 2015-04-28 NOTE — Interval H&P Note (Signed)
History and Physical Interval Note:  04/28/2015 11:40 AM  [name] [name]  has presented today for surgery, with the diagnosis of abnormal cardiolite/shortness of breath/cad  The various methods of treatment have been discussed with the patient and family. After consideration of risks, benefits and other options for treatment, the patient has consented to  Procedure(s): Left Heart Cath and Coronary Angiography (N/A) as a surgical intervention .  The patient's history has been reviewed, patient examined, no change in status, stable for surgery.  I have reviewed the patient's chart and labs.  Questions were answered to the patient's satisfaction.     Tonny Bollmanooper, Honesty Menta

## 2015-04-28 NOTE — Discharge Instructions (Signed)
Radial Site Care °Refer to this sheet in the next few weeks. These instructions provide you with information about caring for yourself after your procedure. Your health care provider may also give you more specific instructions. Your treatment has been planned according to current medical practices, but problems sometimes occur. Call your health care provider if you have any problems or questions after your procedure. °WHAT TO EXPECT AFTER THE PROCEDURE °After your procedure, it is typical to have the following: °· Bruising at the radial site that usually fades within 1-2 weeks. °· Blood collecting in the tissue (hematoma) that may be painful to the touch. It should usually decrease in size and tenderness within 1-2 weeks. °HOME CARE INSTRUCTIONS °· Take medicines only as directed by your health care provider. °· You may shower 24-48 hours after the procedure or as directed by your health care provider. Remove the bandage (dressing) and gently wash the site with plain soap and water. Pat the area dry with a clean towel. Do not rub the site, because this may cause bleeding. °· Do not take baths, swim, or use a hot tub until your health care provider approves. °· Check your insertion site every day for redness, swelling, or drainage. °· Do not apply powder or lotion to the site. °· Do not flex or bend the affected arm for 24 hours or as directed by your health care provider. °· Do not push or pull heavy objects with the affected arm for 24 hours or as directed by your health care provider. °· Do not lift over 10 lb (4.5 kg) for 5 days after your procedure or as directed by your health care provider. °· Ask your health care provider when it is okay to: °¨ Return to work or school. °¨ Resume usual physical activities or sports. °¨ Resume sexual activity. °· Do not drive home if you are discharged the same day as the procedure. Have someone else drive you. °· You may drive 24 hours after the procedure unless otherwise  instructed by your health care provider. °· Do not operate machinery or power tools for 24 hours after the procedure. °· If your procedure was done as an outpatient procedure, which means that you went home the same day as your procedure, a responsible adult should be with you for the first 24 hours after you arrive home. °· Keep all follow-up visits as directed by your health care provider. This is important. °SEEK MEDICAL CARE IF: °· You have a fever. °· You have chills. °· You have increased bleeding from the radial site. Hold pressure on the site and call 911. °SEEK IMMEDIATE MEDICAL CARE IF: °· You have unusual pain at the radial site. °· You have redness, warmth, or swelling at the radial site. °· You have drainage (other than a small amount of blood on the dressing) from the radial site. °· The radial site is bleeding, and the bleeding does not stop after 30 minutes of holding steady pressure on the site. °· Your arm or hand becomes pale, cool, tingly, or numb. °  °This information is not intended to replace advice given to you by your health care provider. Make sure you discuss any questions you have with your health care provider. °  °Document Released: 07/15/2010 Document Revised: 07/03/2014 Document Reviewed: 12/29/2013 °Elsevier Interactive Patient Education ©2016 [employer]vier Inc. ° °

## 2015-04-28 NOTE — H&P (View-Only) (Signed)
Cardiology Office Note  Date: 04/20/2015   ID: [name], [name] Oct 29, 1950, MRN [id number]  PCP: Juliette Alcide, MD  Primary Cardiologist: Nona Dell, MD   Chief Complaint  Patient presents with  . Follow-up testing  . Coronary Artery Disease    History of Present Illness: [name] [name] is a 64 y.o. female seen in consultation in September with symptoms including decreased stamina and shortness of breath in the setting of known ischemic heart disease. Follow-up echocardiogram and Lexiscan Cardiolite were arranged, results outlined below.  She comes in today to discuss the results. LVEF is normal with overall mild diastolic dysfunction in no major valvular heart disease. She did have a region of ischemia, moderate sized within the basal anteroseptal, inferoseptal, and mid inferoseptal myocardium. She continues to report dyspnea on exertion with basic ADLs, has to rest more frequently.  It has been 12 years since she underwent placement of DES to ostial RCA, and quite likely that she has had progressive CAD, particular with type 2 diabetes mellitus and hypertension.  We discussed options for further management including medication adjustments versus proceeding to a diagnostic cardiac catheterization to evaluate for revascularization strategies. She is inclined to pursue cardiac catheterization at this time in light of her current symptoms. We discussed the risks and benefits. She is in agreement to proceed.   Past Medical History  Diagnosis Date  . Coronary artery disease     NSTEMI 2004, DES to ostial RCA  . Hyperlipidemia   . Essential hypertension   . GERD (gastroesophageal reflux disease)   . Anxiety and depression   . Arthritis   . Constipation   . Pulmonary nodule   . DDD (degenerative disc disease)   . Carpal tunnel syndrome   . IBS (irritable bowel syndrome)   . Low back pain   . Type 2 diabetes mellitus (HCC)   . Postmenopausal   . Chronic kidney  disease (CKD), stage II (mild)     Past Surgical History  Procedure Laterality Date  . Cholestectomy    . Excision of ovarian cyst    . Abdominal hysterectomy    . Cervical disectomy and fusion    . Right shoulder rtc    . L4-5 with pedicle screws and rods      Current Outpatient Prescriptions  Medication Sig Dispense Refill  . amLODipine (NORVASC) 10 MG tablet Take 10 mg by mouth daily.      Marland Kitchen aspirin 81 MG tablet Take 81 mg by mouth daily.      . Cholecalciferol (VITAMIN D3) 2000 UNITS TABS Take 1 tablet by mouth daily.    . hydrochlorothiazide (HYDRODIURIL) 25 MG tablet Take 25 mg by mouth daily.      Marland Kitchen lisinopril (PRINIVIL,ZESTRIL) 40 MG tablet Take 40 mg by mouth daily.      . metFORMIN (GLUCOPHAGE) 500 MG tablet Take 1 tablet by mouth 2 (two) times daily.    . pravastatin (PRAVACHOL) 80 MG tablet Take 80 mg by mouth daily.       No current facility-administered medications for this visit.    Allergies:  Review of patient's allergies indicates no known allergies.   Social History: The patient  reports that she has never smoked. She has never used smokeless tobacco. She reports that she does not drink alcohol or use illicit drugs.   ROS:  Please see the history of present illness. Otherwise, complete review of systems is positive for back pain and leg pain.  All  other systems are reviewed and negative.   Physical Exam: VS:  BP 138/90 mmHg  Pulse 73  Ht 5\' 7"  (1.702 m)  Wt 233 lb (105.688 kg)  BMI 36.48 kg/m2  SpO2 97%, BMI Body mass index is 36.48 kg/(m^2).  Wt Readings from Last 3 Encounters:  04/20/15 233 lb (105.688 kg)  03/26/15 234 lb 12.8 oz (106.505 kg)  01/31/11 224 lb (101.606 kg)     General: Overweight woman, appears comfortable at rest. HEENT: Conjunctiva and lids normal, oropharynx clear. Neck: Supple, no elevated JVP or carotid bruits, no thyromegaly. Lungs: Clear to auscultation, nonlabored breathing at rest. Cardiac: Regular rate and rhythm, no S3  or significant systolic murmur, no pericardial rub. Abdomen: Soft, nontender, bowel sounds present, no guarding or rebound. Extremities: No pitting edema, distal pulses 2+. Skin: Warm and dry. Musculoskeletal: No kyphosis. Neuropsychiatric: Alert and oriented x3, affect grossly appropriate.   ECG: ECG is not ordered today.  Other Studies Reviewed Today:  Echocardiogram 04/01/2015: Study Conclusions  - Left ventricle: The cavity size was normal. Wall thickness was increased in a pattern of mild LVH. Systolic function was vigorous. The estimated ejection fraction was in the range of 65% to 70%. Wall motion was normal; there were no regional wall motion abnormalities. Doppler parameters are consistent with abnormal left ventricular relaxation (grade 1 diastolic dysfunction). - Aortic valve: Trileaflet; mildly calcified leaflets. Left coronary cusp mobility was mildly restricted. - Mitral valve: Heavily calcified annulus. There was trivial regurgitation. - Left atrium: The atrium was mildly dilated. - Right atrium: Central venous pressure (est): 3 mm Hg. - Atrial septum: No defect or patent foramen ovale was identified. - Tricuspid valve: There was trivial regurgitation. - Pulmonary arteries: PA peak pressure: 25 mm Hg (S). - Pericardium, extracardiac: There was no pericardial effusion.  Impressions:  - Mild LVH with LVEF 65-70% and grade 1 diastolic dysfunction. Mild left atrial enlargement. Heavily calcified mitral annulus with trivial mitral regurgitation. Mildly sclerotic aortic valve. Trivial tricuspid regurgitation with PASP 25 mmHg.  Lexiscan Cardiolite 04/01/2015:  There was no ST segment deviation noted during stress.  Defect 1: There is a medium defect of moderate severity present in the basal anteroseptal, basal inferoseptal and mid inferoseptal location.  Findings suggestive of ischemia. However, variable soft tissue attenuation artifact  cannot entirely be ruled out.  Low risk study overall.  Nuclear stress EF: 61%.  Assessment and Plan:  1. Limited stamina and worsening dyspnea and exertion in the setting of ischemic heart disease.  She has a history of NSTEMI with DES to the ostial RCA in 2004. Cardiolite study is abnormal as outlined above with moderate region of ischemia in the basal anteroseptal, basal inferoseptal, and mid inferoseptal myocardium. LVEF normal range with mild diastolic dysfunction. We have discussed her symptoms and the test results. Plan is to proceed with a diagnostic cardiac catheterization to clarify coronary anatomy and assess for revascularization options.  2. Essential hypertension , no changes made to current regimen.  3. Hyperlipidemia, on Pravachol.  4. History of CKD stage II. Last creatinine was 1.2.  Current medicines were reviewed with the patient today.  No orders of the defined types were placed in this encounter.    Disposition: FU with me in 1 month.   Signed, Jonelle SidleSamuel G. McDowell, MD, [facility] Center[facility]ACC 04/20/2015 9:11 AM    [facility] [address], [place], [place] [address] Phone: [phone]; Fax: [phone]

## 2015-04-29 ENCOUNTER — Encounter (HOSPITAL_COMMUNITY): Payer: Self-pay | Admitting: Cardiovascular Disease

## 2015-05-07 ENCOUNTER — Encounter: Payer: Self-pay | Admitting: *Deleted

## 2015-05-07 ENCOUNTER — Other Ambulatory Visit: Payer: Self-pay | Admitting: *Deleted

## 2015-05-07 NOTE — Patient Outreach (Signed)
[facility]) Care Management  05/07/2015  Kathi LudwigKathy M Zappia [birth year] [id number]   [facility] referral: Telephone call to patient who gave HIPPA verification. Patient advised of reason for call & of [facility] care management services.   Subjective:  Patient states she has no major health care concerns at this time. States has history of heart attack with stent placement in 2004. States recent cardiac cath done found no blockages and no change in medications. States her high blood pressure is under control and she is taking her medications consistently and as prescribed by her doctors.  States she is currently taking metformin as advised by doctor but states last blood sugar was reported as normal. States next follow up primary care appointment is scheduled for Feb 2017.  States getting all of medications via mail order and has no co pay.  Patient is able to explain reason she is taking all of her listed medications. Patient states she uses transportation benefit provided by her insurance to go to doctors appointments out of town. States family members take her to doctors appointments in town. Voices understanding of when to call 911.Patient voicing that she did not have any case management needs at this time but thanked CM for call.  Objective: See assessment and medication list as noted.  Assessment: Good family support/assistance. Taking medications as prescribed. Using transportation and mail order pharmacy benefits. Declining  [facility] care management services at this time.  Plan: Send [facility] broch[facility] with contact information. Send MD closure letter. Advise care management assistant to close case.  Colleen CanLinda Deuntae Kocsis, RN BSN CCM Care Management Coordinator [facility] Care Management  [phone]

## 2015-05-19 ENCOUNTER — Encounter: Payer: Commercial Managed Care - HMO | Admitting: Cardiology

## 2015-06-07 ENCOUNTER — Ambulatory Visit (INDEPENDENT_AMBULATORY_CARE_PROVIDER_SITE_OTHER): Payer: Commercial Managed Care - HMO | Admitting: Cardiology

## 2015-06-07 ENCOUNTER — Encounter: Payer: Self-pay | Admitting: Cardiology

## 2015-06-07 VITALS — BP 122/80 | HR 68 | Ht 67.0 in | Wt 235.2 lb

## 2015-06-07 DIAGNOSIS — I10 Essential (primary) hypertension: Secondary | ICD-10-CM | POA: Diagnosis not present

## 2015-06-07 DIAGNOSIS — E785 Hyperlipidemia, unspecified: Secondary | ICD-10-CM

## 2015-06-07 DIAGNOSIS — I2510 Atherosclerotic heart disease of native coronary artery without angina pectoris: Secondary | ICD-10-CM

## 2015-06-07 NOTE — Progress Notes (Signed)
Cardiology Office Note  Date: 06/07/2015   ID: [name] [name], DOB 06/26/1951, MRN [id number]  PCP: Juliette Alcide, MD  Primary Cardiologist: Nona Dell, MD   Chief Complaint  Patient presents with  . Coronary Artery Disease  . Follow-up cardiac catheterization    History of Present Illness: [name] [name] is a 64 y.o. female last seen in October and referred at that time for diagnostic cardiac catheterization following abnormal Cardiolite study indicating possible ischemia in the basal anteroseptal as well as mid to basal inferoseptal walls. Fortunately, cardiac catheterization demonstrated a widely patent RCA stent with otherwise no significant disease in the LAD and left circumflex. Medical therapy was recommended.  She comes in today for a follow-up visit. We discussed the results of her testing, overall very reassuring from a cardiac perspective. We reviewed her medications, and also talked about a basic exercise plan such as walking.  Past Medical History  Diagnosis Date  . Coronary artery disease     NSTEMI 2004, DES to ostial RCA  . Hyperlipidemia   . Essential hypertension   . GERD (gastroesophageal reflux disease)   . Anxiety and depression   . Arthritis   . Constipation   . Pulmonary nodule   . DDD (degenerative disc disease)   . Carpal tunnel syndrome   . IBS (irritable bowel syndrome)   . Low back pain   . Type 2 diabetes mellitus (HCC)   . Postmenopausal   . Chronic kidney disease (CKD), stage II (mild)     Current Outpatient Prescriptions  Medication Sig Dispense Refill  . amLODipine (NORVASC) 10 MG tablet Take 10 mg by mouth daily.      Marland Kitchen aspirin 81 MG tablet Take 81 mg by mouth daily.      . Cholecalciferol (VITAMIN D3) 2000 UNITS TABS Take 1 tablet by mouth daily.    . hydrochlorothiazide (HYDRODIURIL) 25 MG tablet Take 25 mg by mouth daily.      Marland Kitchen lisinopril (PRINIVIL,ZESTRIL) 40 MG tablet Take 40 mg by mouth daily.      . metFORMIN  (GLUCOPHAGE) 500 MG tablet Take 1 tablet by mouth 2 (two) times daily.    . pravastatin (PRAVACHOL) 80 MG tablet Take 80 mg by mouth daily.       No current facility-administered medications for this visit.   Allergies:  Review of patient's allergies indicates no known allergies.   Social History: The patient  reports that she has never smoked. She has never used smokeless tobacco. She reports that she does not drink alcohol or use illicit drugs.   ROS:  Please see the history of present illness. Otherwise, complete review of systems is positive for fatigue and decreased energy.  All other systems are reviewed and negative.   Physical Exam: VS:  BP 122/80 mmHg  Pulse 68  Ht  (1.702 m)  Wt 235 lb 3.2 oz (106.686 kg)  BMI 36.83 kg/m2  SpO2 97%, BMI Body mass index is 36.83 kg/(m^2).  Wt Readings from Last 3 Encounters:  06/07/15 235 lb 3.2 oz (106.686 kg)  04/28/15 233 lb (105.688 kg)  04/20/15 233 lb (105.688 kg)    General: Patient appears comfortable at rest. HEENT: Conjunctiva and lids normal, oropharynx clear with moist mucosa. Neck: Supple, no elevated JVP or carotid bruits, no thyromegaly. Lungs: Clear to auscultation, nonlabored breathing at rest. Cardiac: Regular rate and rhythm, no S3 or significant systolic murmur, no pericardial rub. Abdomen: Soft, nontender, no hepatomegaly, bowel sounds present,  no guarding or rebound. Extremities: No pitting edema, distal pulses 2+.  ECG: Tracing from 04/28/2015 showed normal sinus rhythm with possible old inferior infarct pattern and nonspecific ST-T changes.  Recent Labwork: 04/28/2015: BUN 14; Creatinine, Ser 1.15*; Hemoglobin 13.0; Platelets 234; Potassium 3.3*; Sodium 139   Other Studies Reviewed Today:  Echocardiogram 04/01/2015: Study Conclusions  - Left ventricle: The cavity size was normal. Wall thickness was increased in a pattern of mild LVH. Systolic function was vigorous. The estimated ejection fraction was  in the range of 65% to 70%. Wall motion was normal; there were no regional wall motion abnormalities. Doppler parameters are consistent with abnormal left ventricular relaxation (grade 1 diastolic dysfunction). - Aortic valve: Trileaflet; mildly calcified leaflets. Left coronary cusp mobility was mildly restricted. - Mitral valve: Heavily calcified annulus. There was trivial regurgitation. - Left atrium: The atrium was mildly dilated. - Right atrium: Central venous pressure (est): 3 mm Hg. - Atrial septum: No defect or patent foramen ovale was identified. - Tricuspid valve: There was trivial regurgitation. - Pulmonary arteries: PA peak pressure: 25 mm Hg (S). - Pericardium, extracardiac: There was no pericardial effusion.  Impressions:  - Mild LVH with LVEF 65-70% and grade 1 diastolic dysfunction. Mild left atrial enlargement. Heavily calcified mitral annulus with trivial mitral regurgitation. Mildly sclerotic aortic valve. Trivial tricuspid regurgitation with PASP 25 mmHg.  Cardiac catheterization 04/28/2015: 1. Widely patent RCA stent with no significant obstruction in that vessel. 2. Widely patent left main, LAD, and left circumflex 3. Normal LV function by echo 4. Normal LVEDP  Assessment and Plan:  1. CAD status post DES to the RCA in 2004, recent cardiac catheterization showed widely patent stent with otherwise no significant left system disease. Plan will be to continue medical therapy and observation. I have recommended a walking regimen for exercise.  2. Hyperlipidemia, on Pravachol.  3. Essential hypertension, on Norvasc, HCTZ, and Prinivil. Blood pressure control is good today.  Current medicines were reviewed with the patient today.  Disposition: FU with me in 1 year.   Signed, Jonelle SidleSamuel G. McDowell, MD, [facility] 06/07/2015 9:30 AM    [facility] [address], [place], [place] [address] Phone: [phone]) [phone]; Fax:  [phone]

## 2015-06-07 NOTE — Patient Instructions (Signed)

## 2015-08-31 DIAGNOSIS — Z1231 Encounter for screening mammogram for malignant neoplasm of breast: Secondary | ICD-10-CM | POA: Diagnosis not present

## 2015-09-20 DIAGNOSIS — E782 Mixed hyperlipidemia: Secondary | ICD-10-CM | POA: Diagnosis not present

## 2015-09-20 DIAGNOSIS — M1990 Unspecified osteoarthritis, unspecified site: Secondary | ICD-10-CM | POA: Diagnosis not present

## 2015-09-20 DIAGNOSIS — N183 Chronic kidney disease, stage 3 (moderate): Secondary | ICD-10-CM | POA: Diagnosis not present

## 2015-09-20 DIAGNOSIS — I10 Essential (primary) hypertension: Secondary | ICD-10-CM | POA: Diagnosis not present

## 2015-09-20 DIAGNOSIS — R7301 Impaired fasting glucose: Secondary | ICD-10-CM | POA: Diagnosis not present

## 2015-09-23 DIAGNOSIS — G4700 Insomnia, unspecified: Secondary | ICD-10-CM | POA: Insufficient documentation

## 2015-09-23 DIAGNOSIS — M722 Plantar fascial fibromatosis: Secondary | ICD-10-CM | POA: Insufficient documentation

## 2015-09-24 DIAGNOSIS — N183 Chronic kidney disease, stage 3 (moderate): Secondary | ICD-10-CM | POA: Diagnosis not present

## 2015-09-24 DIAGNOSIS — I10 Essential (primary) hypertension: Secondary | ICD-10-CM | POA: Diagnosis not present

## 2015-09-24 DIAGNOSIS — Z0001 Encounter for general adult medical examination with abnormal findings: Secondary | ICD-10-CM | POA: Diagnosis not present

## 2015-09-24 DIAGNOSIS — R6889 Other general symptoms and signs: Secondary | ICD-10-CM | POA: Diagnosis not present

## 2015-09-24 DIAGNOSIS — E782 Mixed hyperlipidemia: Secondary | ICD-10-CM | POA: Diagnosis not present

## 2015-09-24 DIAGNOSIS — R7301 Impaired fasting glucose: Secondary | ICD-10-CM | POA: Diagnosis not present

## 2015-09-24 DIAGNOSIS — N951 Menopausal and female climacteric states: Secondary | ICD-10-CM | POA: Diagnosis not present

## 2015-09-24 DIAGNOSIS — E559 Vitamin D deficiency, unspecified: Secondary | ICD-10-CM | POA: Diagnosis not present

## 2016-03-27 DIAGNOSIS — E559 Vitamin D deficiency, unspecified: Secondary | ICD-10-CM | POA: Diagnosis not present

## 2016-03-27 DIAGNOSIS — E782 Mixed hyperlipidemia: Secondary | ICD-10-CM | POA: Diagnosis not present

## 2016-03-27 DIAGNOSIS — R7301 Impaired fasting glucose: Secondary | ICD-10-CM | POA: Diagnosis not present

## 2016-03-27 DIAGNOSIS — I10 Essential (primary) hypertension: Secondary | ICD-10-CM | POA: Diagnosis not present

## 2016-03-27 DIAGNOSIS — N183 Chronic kidney disease, stage 3 (moderate): Secondary | ICD-10-CM | POA: Diagnosis not present

## 2016-03-29 DIAGNOSIS — M5430 Sciatica, unspecified side: Secondary | ICD-10-CM | POA: Diagnosis not present

## 2016-03-29 DIAGNOSIS — Z23 Encounter for immunization: Secondary | ICD-10-CM | POA: Diagnosis not present

## 2016-03-29 DIAGNOSIS — Z6835 Body mass index (BMI) 35.0-35.9, adult: Secondary | ICD-10-CM | POA: Diagnosis not present

## 2016-03-29 DIAGNOSIS — E782 Mixed hyperlipidemia: Secondary | ICD-10-CM | POA: Diagnosis not present

## 2016-03-29 DIAGNOSIS — R7301 Impaired fasting glucose: Secondary | ICD-10-CM | POA: Diagnosis not present

## 2016-03-29 DIAGNOSIS — N183 Chronic kidney disease, stage 3 (moderate): Secondary | ICD-10-CM | POA: Diagnosis not present

## 2016-03-29 DIAGNOSIS — I10 Essential (primary) hypertension: Secondary | ICD-10-CM | POA: Diagnosis not present

## 2016-09-07 DIAGNOSIS — Z1231 Encounter for screening mammogram for malignant neoplasm of breast: Secondary | ICD-10-CM | POA: Diagnosis not present

## 2016-10-02 DIAGNOSIS — I10 Essential (primary) hypertension: Secondary | ICD-10-CM | POA: Diagnosis not present

## 2016-10-02 DIAGNOSIS — E559 Vitamin D deficiency, unspecified: Secondary | ICD-10-CM | POA: Diagnosis not present

## 2016-10-02 DIAGNOSIS — M1990 Unspecified osteoarthritis, unspecified site: Secondary | ICD-10-CM | POA: Diagnosis not present

## 2016-10-02 DIAGNOSIS — E782 Mixed hyperlipidemia: Secondary | ICD-10-CM | POA: Diagnosis not present

## 2016-10-02 DIAGNOSIS — R7301 Impaired fasting glucose: Secondary | ICD-10-CM | POA: Diagnosis not present

## 2016-10-02 DIAGNOSIS — N183 Chronic kidney disease, stage 3 (moderate): Secondary | ICD-10-CM | POA: Diagnosis not present

## 2016-10-05 DIAGNOSIS — E559 Vitamin D deficiency, unspecified: Secondary | ICD-10-CM | POA: Diagnosis not present

## 2016-10-05 DIAGNOSIS — E782 Mixed hyperlipidemia: Secondary | ICD-10-CM | POA: Diagnosis not present

## 2016-10-05 DIAGNOSIS — M5430 Sciatica, unspecified side: Secondary | ICD-10-CM | POA: Diagnosis not present

## 2016-10-05 DIAGNOSIS — Z0001 Encounter for general adult medical examination with abnormal findings: Secondary | ICD-10-CM | POA: Diagnosis not present

## 2016-10-05 DIAGNOSIS — R7301 Impaired fasting glucose: Secondary | ICD-10-CM | POA: Diagnosis not present

## 2016-10-05 DIAGNOSIS — Z6837 Body mass index (BMI) 37.0-37.9, adult: Secondary | ICD-10-CM | POA: Diagnosis not present

## 2016-10-05 DIAGNOSIS — I10 Essential (primary) hypertension: Secondary | ICD-10-CM | POA: Diagnosis not present

## 2016-10-05 DIAGNOSIS — N183 Chronic kidney disease, stage 3 (moderate): Secondary | ICD-10-CM | POA: Diagnosis not present

## 2016-10-05 DIAGNOSIS — M545 Low back pain: Secondary | ICD-10-CM | POA: Diagnosis not present

## 2017-01-29 DIAGNOSIS — M5430 Sciatica, unspecified side: Secondary | ICD-10-CM | POA: Diagnosis not present

## 2017-01-29 DIAGNOSIS — E782 Mixed hyperlipidemia: Secondary | ICD-10-CM | POA: Diagnosis not present

## 2017-01-29 DIAGNOSIS — M545 Low back pain: Secondary | ICD-10-CM | POA: Diagnosis not present

## 2017-01-29 DIAGNOSIS — N183 Chronic kidney disease, stage 3 (moderate): Secondary | ICD-10-CM | POA: Diagnosis not present

## 2017-01-29 DIAGNOSIS — Z6837 Body mass index (BMI) 37.0-37.9, adult: Secondary | ICD-10-CM | POA: Diagnosis not present

## 2017-01-29 DIAGNOSIS — I10 Essential (primary) hypertension: Secondary | ICD-10-CM | POA: Diagnosis not present

## 2017-01-29 DIAGNOSIS — R202 Paresthesia of skin: Secondary | ICD-10-CM | POA: Diagnosis not present

## 2017-04-02 DIAGNOSIS — E782 Mixed hyperlipidemia: Secondary | ICD-10-CM | POA: Diagnosis not present

## 2017-04-02 DIAGNOSIS — R7301 Impaired fasting glucose: Secondary | ICD-10-CM | POA: Diagnosis not present

## 2017-04-02 DIAGNOSIS — I10 Essential (primary) hypertension: Secondary | ICD-10-CM | POA: Diagnosis not present

## 2017-04-02 DIAGNOSIS — N183 Chronic kidney disease, stage 3 (moderate): Secondary | ICD-10-CM | POA: Diagnosis not present

## 2017-04-02 DIAGNOSIS — E559 Vitamin D deficiency, unspecified: Secondary | ICD-10-CM | POA: Diagnosis not present

## 2017-04-04 DIAGNOSIS — Z23 Encounter for immunization: Secondary | ICD-10-CM | POA: Diagnosis not present

## 2017-04-04 DIAGNOSIS — R7301 Impaired fasting glucose: Secondary | ICD-10-CM | POA: Diagnosis not present

## 2017-04-04 DIAGNOSIS — E782 Mixed hyperlipidemia: Secondary | ICD-10-CM | POA: Diagnosis not present

## 2017-04-04 DIAGNOSIS — M5430 Sciatica, unspecified side: Secondary | ICD-10-CM | POA: Diagnosis not present

## 2017-04-04 DIAGNOSIS — I10 Essential (primary) hypertension: Secondary | ICD-10-CM | POA: Diagnosis not present

## 2017-04-04 DIAGNOSIS — E559 Vitamin D deficiency, unspecified: Secondary | ICD-10-CM | POA: Diagnosis not present

## 2017-04-04 DIAGNOSIS — Z6836 Body mass index (BMI) 36.0-36.9, adult: Secondary | ICD-10-CM | POA: Diagnosis not present

## 2017-04-04 DIAGNOSIS — N183 Chronic kidney disease, stage 3 (moderate): Secondary | ICD-10-CM | POA: Diagnosis not present

## 2017-04-11 DIAGNOSIS — I2510 Atherosclerotic heart disease of native coronary artery without angina pectoris: Secondary | ICD-10-CM | POA: Diagnosis not present

## 2017-04-11 DIAGNOSIS — I129 Hypertensive chronic kidney disease with stage 1 through stage 4 chronic kidney disease, or unspecified chronic kidney disease: Secondary | ICD-10-CM | POA: Diagnosis not present

## 2017-04-11 DIAGNOSIS — M545 Low back pain: Secondary | ICD-10-CM | POA: Diagnosis not present

## 2017-04-11 DIAGNOSIS — E1122 Type 2 diabetes mellitus with diabetic chronic kidney disease: Secondary | ICD-10-CM | POA: Diagnosis not present

## 2017-04-11 DIAGNOSIS — M5410 Radiculopathy, site unspecified: Secondary | ICD-10-CM | POA: Diagnosis not present

## 2017-04-11 DIAGNOSIS — M722 Plantar fascial fibromatosis: Secondary | ICD-10-CM | POA: Diagnosis not present

## 2017-04-11 DIAGNOSIS — Z6836 Body mass index (BMI) 36.0-36.9, adult: Secondary | ICD-10-CM | POA: Diagnosis not present

## 2017-04-11 DIAGNOSIS — N183 Chronic kidney disease, stage 3 (moderate): Secondary | ICD-10-CM | POA: Diagnosis not present

## 2017-04-12 DIAGNOSIS — M85851 Other specified disorders of bone density and structure, right thigh: Secondary | ICD-10-CM | POA: Diagnosis not present

## 2017-04-12 DIAGNOSIS — M8589 Other specified disorders of bone density and structure, multiple sites: Secondary | ICD-10-CM | POA: Diagnosis not present

## 2017-04-16 DIAGNOSIS — I129 Hypertensive chronic kidney disease with stage 1 through stage 4 chronic kidney disease, or unspecified chronic kidney disease: Secondary | ICD-10-CM | POA: Diagnosis not present

## 2017-04-16 DIAGNOSIS — Z6836 Body mass index (BMI) 36.0-36.9, adult: Secondary | ICD-10-CM | POA: Diagnosis not present

## 2017-04-16 DIAGNOSIS — M545 Low back pain: Secondary | ICD-10-CM | POA: Diagnosis not present

## 2017-04-16 DIAGNOSIS — M722 Plantar fascial fibromatosis: Secondary | ICD-10-CM | POA: Diagnosis not present

## 2017-04-16 DIAGNOSIS — N183 Chronic kidney disease, stage 3 (moderate): Secondary | ICD-10-CM | POA: Diagnosis not present

## 2017-04-16 DIAGNOSIS — M5410 Radiculopathy, site unspecified: Secondary | ICD-10-CM | POA: Diagnosis not present

## 2017-04-16 DIAGNOSIS — E1122 Type 2 diabetes mellitus with diabetic chronic kidney disease: Secondary | ICD-10-CM | POA: Diagnosis not present

## 2017-04-16 DIAGNOSIS — I2510 Atherosclerotic heart disease of native coronary artery without angina pectoris: Secondary | ICD-10-CM | POA: Diagnosis not present

## 2017-04-19 DIAGNOSIS — E1122 Type 2 diabetes mellitus with diabetic chronic kidney disease: Secondary | ICD-10-CM | POA: Diagnosis not present

## 2017-04-19 DIAGNOSIS — Z6836 Body mass index (BMI) 36.0-36.9, adult: Secondary | ICD-10-CM | POA: Diagnosis not present

## 2017-04-19 DIAGNOSIS — M722 Plantar fascial fibromatosis: Secondary | ICD-10-CM | POA: Diagnosis not present

## 2017-04-19 DIAGNOSIS — I129 Hypertensive chronic kidney disease with stage 1 through stage 4 chronic kidney disease, or unspecified chronic kidney disease: Secondary | ICD-10-CM | POA: Diagnosis not present

## 2017-04-19 DIAGNOSIS — I2510 Atherosclerotic heart disease of native coronary artery without angina pectoris: Secondary | ICD-10-CM | POA: Diagnosis not present

## 2017-04-19 DIAGNOSIS — M545 Low back pain: Secondary | ICD-10-CM | POA: Diagnosis not present

## 2017-04-19 DIAGNOSIS — M5410 Radiculopathy, site unspecified: Secondary | ICD-10-CM | POA: Diagnosis not present

## 2017-04-19 DIAGNOSIS — N183 Chronic kidney disease, stage 3 (moderate): Secondary | ICD-10-CM | POA: Diagnosis not present

## 2017-04-23 DIAGNOSIS — M5410 Radiculopathy, site unspecified: Secondary | ICD-10-CM | POA: Diagnosis not present

## 2017-04-23 DIAGNOSIS — I129 Hypertensive chronic kidney disease with stage 1 through stage 4 chronic kidney disease, or unspecified chronic kidney disease: Secondary | ICD-10-CM | POA: Diagnosis not present

## 2017-04-23 DIAGNOSIS — M545 Low back pain: Secondary | ICD-10-CM | POA: Diagnosis not present

## 2017-04-23 DIAGNOSIS — I2510 Atherosclerotic heart disease of native coronary artery without angina pectoris: Secondary | ICD-10-CM | POA: Diagnosis not present

## 2017-04-23 DIAGNOSIS — N183 Chronic kidney disease, stage 3 (moderate): Secondary | ICD-10-CM | POA: Diagnosis not present

## 2017-04-23 DIAGNOSIS — Z6836 Body mass index (BMI) 36.0-36.9, adult: Secondary | ICD-10-CM | POA: Diagnosis not present

## 2017-04-23 DIAGNOSIS — E1122 Type 2 diabetes mellitus with diabetic chronic kidney disease: Secondary | ICD-10-CM | POA: Diagnosis not present

## 2017-04-23 DIAGNOSIS — M722 Plantar fascial fibromatosis: Secondary | ICD-10-CM | POA: Diagnosis not present

## 2017-04-26 DIAGNOSIS — I2510 Atherosclerotic heart disease of native coronary artery without angina pectoris: Secondary | ICD-10-CM | POA: Diagnosis not present

## 2017-04-26 DIAGNOSIS — M545 Low back pain: Secondary | ICD-10-CM | POA: Diagnosis not present

## 2017-04-26 DIAGNOSIS — Z6836 Body mass index (BMI) 36.0-36.9, adult: Secondary | ICD-10-CM | POA: Diagnosis not present

## 2017-04-26 DIAGNOSIS — I129 Hypertensive chronic kidney disease with stage 1 through stage 4 chronic kidney disease, or unspecified chronic kidney disease: Secondary | ICD-10-CM | POA: Diagnosis not present

## 2017-04-26 DIAGNOSIS — N183 Chronic kidney disease, stage 3 (moderate): Secondary | ICD-10-CM | POA: Diagnosis not present

## 2017-04-26 DIAGNOSIS — M5410 Radiculopathy, site unspecified: Secondary | ICD-10-CM | POA: Diagnosis not present

## 2017-04-26 DIAGNOSIS — M722 Plantar fascial fibromatosis: Secondary | ICD-10-CM | POA: Diagnosis not present

## 2017-04-26 DIAGNOSIS — E1122 Type 2 diabetes mellitus with diabetic chronic kidney disease: Secondary | ICD-10-CM | POA: Diagnosis not present

## 2017-04-30 DIAGNOSIS — I129 Hypertensive chronic kidney disease with stage 1 through stage 4 chronic kidney disease, or unspecified chronic kidney disease: Secondary | ICD-10-CM | POA: Diagnosis not present

## 2017-04-30 DIAGNOSIS — M545 Low back pain: Secondary | ICD-10-CM | POA: Diagnosis not present

## 2017-04-30 DIAGNOSIS — N183 Chronic kidney disease, stage 3 (moderate): Secondary | ICD-10-CM | POA: Diagnosis not present

## 2017-04-30 DIAGNOSIS — M5410 Radiculopathy, site unspecified: Secondary | ICD-10-CM | POA: Diagnosis not present

## 2017-04-30 DIAGNOSIS — Z6836 Body mass index (BMI) 36.0-36.9, adult: Secondary | ICD-10-CM | POA: Diagnosis not present

## 2017-04-30 DIAGNOSIS — M722 Plantar fascial fibromatosis: Secondary | ICD-10-CM | POA: Diagnosis not present

## 2017-04-30 DIAGNOSIS — E1122 Type 2 diabetes mellitus with diabetic chronic kidney disease: Secondary | ICD-10-CM | POA: Diagnosis not present

## 2017-04-30 DIAGNOSIS — I2510 Atherosclerotic heart disease of native coronary artery without angina pectoris: Secondary | ICD-10-CM | POA: Diagnosis not present

## 2017-05-03 DIAGNOSIS — I2510 Atherosclerotic heart disease of native coronary artery without angina pectoris: Secondary | ICD-10-CM | POA: Diagnosis not present

## 2017-05-03 DIAGNOSIS — M5410 Radiculopathy, site unspecified: Secondary | ICD-10-CM | POA: Diagnosis not present

## 2017-05-03 DIAGNOSIS — I129 Hypertensive chronic kidney disease with stage 1 through stage 4 chronic kidney disease, or unspecified chronic kidney disease: Secondary | ICD-10-CM | POA: Diagnosis not present

## 2017-05-03 DIAGNOSIS — E1122 Type 2 diabetes mellitus with diabetic chronic kidney disease: Secondary | ICD-10-CM | POA: Diagnosis not present

## 2017-05-03 DIAGNOSIS — Z6836 Body mass index (BMI) 36.0-36.9, adult: Secondary | ICD-10-CM | POA: Diagnosis not present

## 2017-05-03 DIAGNOSIS — N183 Chronic kidney disease, stage 3 (moderate): Secondary | ICD-10-CM | POA: Diagnosis not present

## 2017-05-03 DIAGNOSIS — M722 Plantar fascial fibromatosis: Secondary | ICD-10-CM | POA: Diagnosis not present

## 2017-05-03 DIAGNOSIS — M545 Low back pain: Secondary | ICD-10-CM | POA: Diagnosis not present

## 2017-05-09 DIAGNOSIS — M722 Plantar fascial fibromatosis: Secondary | ICD-10-CM | POA: Diagnosis not present

## 2017-05-09 DIAGNOSIS — I2510 Atherosclerotic heart disease of native coronary artery without angina pectoris: Secondary | ICD-10-CM | POA: Diagnosis not present

## 2017-05-09 DIAGNOSIS — Z6836 Body mass index (BMI) 36.0-36.9, adult: Secondary | ICD-10-CM | POA: Diagnosis not present

## 2017-05-09 DIAGNOSIS — M5410 Radiculopathy, site unspecified: Secondary | ICD-10-CM | POA: Diagnosis not present

## 2017-05-09 DIAGNOSIS — I129 Hypertensive chronic kidney disease with stage 1 through stage 4 chronic kidney disease, or unspecified chronic kidney disease: Secondary | ICD-10-CM | POA: Diagnosis not present

## 2017-05-09 DIAGNOSIS — M545 Low back pain: Secondary | ICD-10-CM | POA: Diagnosis not present

## 2017-05-09 DIAGNOSIS — E1122 Type 2 diabetes mellitus with diabetic chronic kidney disease: Secondary | ICD-10-CM | POA: Diagnosis not present

## 2017-05-09 DIAGNOSIS — N183 Chronic kidney disease, stage 3 (moderate): Secondary | ICD-10-CM | POA: Diagnosis not present

## 2017-06-14 DIAGNOSIS — M25511 Pain in right shoulder: Secondary | ICD-10-CM | POA: Diagnosis not present

## 2017-06-14 DIAGNOSIS — Z6835 Body mass index (BMI) 35.0-35.9, adult: Secondary | ICD-10-CM | POA: Diagnosis not present

## 2017-06-14 DIAGNOSIS — M25562 Pain in left knee: Secondary | ICD-10-CM | POA: Diagnosis not present

## 2017-06-14 DIAGNOSIS — M25561 Pain in right knee: Secondary | ICD-10-CM | POA: Diagnosis not present

## 2017-06-14 DIAGNOSIS — I739 Peripheral vascular disease, unspecified: Secondary | ICD-10-CM | POA: Diagnosis not present

## 2017-06-20 DIAGNOSIS — I739 Peripheral vascular disease, unspecified: Secondary | ICD-10-CM | POA: Diagnosis not present

## 2017-10-15 DIAGNOSIS — I739 Peripheral vascular disease, unspecified: Secondary | ICD-10-CM | POA: Diagnosis not present

## 2017-10-15 DIAGNOSIS — E782 Mixed hyperlipidemia: Secondary | ICD-10-CM | POA: Diagnosis not present

## 2017-10-15 DIAGNOSIS — N183 Chronic kidney disease, stage 3 (moderate): Secondary | ICD-10-CM | POA: Diagnosis not present

## 2017-10-15 DIAGNOSIS — R7301 Impaired fasting glucose: Secondary | ICD-10-CM | POA: Diagnosis not present

## 2017-10-15 DIAGNOSIS — M1990 Unspecified osteoarthritis, unspecified site: Secondary | ICD-10-CM | POA: Diagnosis not present

## 2017-10-15 DIAGNOSIS — I10 Essential (primary) hypertension: Secondary | ICD-10-CM | POA: Diagnosis not present

## 2017-10-15 DIAGNOSIS — G4700 Insomnia, unspecified: Secondary | ICD-10-CM | POA: Diagnosis not present

## 2017-10-19 DIAGNOSIS — M25562 Pain in left knee: Secondary | ICD-10-CM | POA: Diagnosis not present

## 2017-10-19 DIAGNOSIS — M25561 Pain in right knee: Secondary | ICD-10-CM | POA: Diagnosis not present

## 2017-10-19 DIAGNOSIS — Z0001 Encounter for general adult medical examination with abnormal findings: Secondary | ICD-10-CM | POA: Diagnosis not present

## 2017-10-19 DIAGNOSIS — I739 Peripheral vascular disease, unspecified: Secondary | ICD-10-CM | POA: Diagnosis not present

## 2017-10-19 DIAGNOSIS — E782 Mixed hyperlipidemia: Secondary | ICD-10-CM | POA: Diagnosis not present

## 2017-10-19 DIAGNOSIS — Z6835 Body mass index (BMI) 35.0-35.9, adult: Secondary | ICD-10-CM | POA: Diagnosis not present

## 2017-10-19 DIAGNOSIS — E559 Vitamin D deficiency, unspecified: Secondary | ICD-10-CM | POA: Diagnosis not present

## 2017-10-19 DIAGNOSIS — Z23 Encounter for immunization: Secondary | ICD-10-CM | POA: Diagnosis not present

## 2017-10-19 DIAGNOSIS — M545 Low back pain: Secondary | ICD-10-CM | POA: Diagnosis not present

## 2017-10-19 DIAGNOSIS — M26629 Arthralgia of temporomandibular joint, unspecified side: Secondary | ICD-10-CM | POA: Diagnosis not present

## 2017-10-19 DIAGNOSIS — M19049 Primary osteoarthritis, unspecified hand: Secondary | ICD-10-CM | POA: Insufficient documentation

## 2017-11-22 DIAGNOSIS — I2510 Atherosclerotic heart disease of native coronary artery without angina pectoris: Secondary | ICD-10-CM | POA: Diagnosis not present

## 2017-11-22 DIAGNOSIS — E669 Obesity, unspecified: Secondary | ICD-10-CM | POA: Diagnosis not present

## 2017-11-22 DIAGNOSIS — R768 Other specified abnormal immunological findings in serum: Secondary | ICD-10-CM | POA: Diagnosis not present

## 2017-11-22 DIAGNOSIS — E1122 Type 2 diabetes mellitus with diabetic chronic kidney disease: Secondary | ICD-10-CM | POA: Diagnosis not present

## 2017-11-22 DIAGNOSIS — Z6837 Body mass index (BMI) 37.0-37.9, adult: Secondary | ICD-10-CM | POA: Diagnosis not present

## 2017-11-22 DIAGNOSIS — M2550 Pain in unspecified joint: Secondary | ICD-10-CM | POA: Diagnosis not present

## 2017-11-22 DIAGNOSIS — M150 Primary generalized (osteo)arthritis: Secondary | ICD-10-CM | POA: Diagnosis not present

## 2018-04-22 DIAGNOSIS — E559 Vitamin D deficiency, unspecified: Secondary | ICD-10-CM | POA: Diagnosis not present

## 2018-04-22 DIAGNOSIS — I10 Essential (primary) hypertension: Secondary | ICD-10-CM | POA: Diagnosis not present

## 2018-04-22 DIAGNOSIS — E782 Mixed hyperlipidemia: Secondary | ICD-10-CM | POA: Diagnosis not present

## 2018-04-22 DIAGNOSIS — N183 Chronic kidney disease, stage 3 (moderate): Secondary | ICD-10-CM | POA: Diagnosis not present

## 2018-04-22 DIAGNOSIS — R7301 Impaired fasting glucose: Secondary | ICD-10-CM | POA: Diagnosis not present

## 2018-04-25 DIAGNOSIS — R7301 Impaired fasting glucose: Secondary | ICD-10-CM | POA: Diagnosis not present

## 2018-04-25 DIAGNOSIS — E782 Mixed hyperlipidemia: Secondary | ICD-10-CM | POA: Diagnosis not present

## 2018-04-25 DIAGNOSIS — I10 Essential (primary) hypertension: Secondary | ICD-10-CM | POA: Diagnosis not present

## 2018-04-25 DIAGNOSIS — E559 Vitamin D deficiency, unspecified: Secondary | ICD-10-CM | POA: Diagnosis not present

## 2018-04-25 DIAGNOSIS — I739 Peripheral vascular disease, unspecified: Secondary | ICD-10-CM | POA: Diagnosis not present

## 2018-04-25 DIAGNOSIS — R894 Abnormal immunological findings in specimens from other organs, systems and tissues: Secondary | ICD-10-CM | POA: Insufficient documentation

## 2018-04-25 DIAGNOSIS — R768 Other specified abnormal immunological findings in serum: Secondary | ICD-10-CM | POA: Diagnosis not present

## 2018-04-25 DIAGNOSIS — Z6835 Body mass index (BMI) 35.0-35.9, adult: Secondary | ICD-10-CM | POA: Diagnosis not present

## 2018-04-25 DIAGNOSIS — M2550 Pain in unspecified joint: Secondary | ICD-10-CM | POA: Diagnosis not present

## 2018-06-12 DIAGNOSIS — Z6835 Body mass index (BMI) 35.0-35.9, adult: Secondary | ICD-10-CM | POA: Diagnosis not present

## 2018-06-12 DIAGNOSIS — T466X5A Adverse effect of antihyperlipidemic and antiarteriosclerotic drugs, initial encounter: Secondary | ICD-10-CM | POA: Diagnosis not present

## 2018-06-12 DIAGNOSIS — M2550 Pain in unspecified joint: Secondary | ICD-10-CM | POA: Diagnosis not present

## 2018-06-12 DIAGNOSIS — Z23 Encounter for immunization: Secondary | ICD-10-CM | POA: Diagnosis not present

## 2018-06-12 DIAGNOSIS — M791 Myalgia, unspecified site: Secondary | ICD-10-CM | POA: Diagnosis not present

## 2018-06-12 DIAGNOSIS — IMO0001 Reserved for inherently not codable concepts without codable children: Secondary | ICD-10-CM | POA: Insufficient documentation

## 2018-06-12 DIAGNOSIS — E782 Mixed hyperlipidemia: Secondary | ICD-10-CM | POA: Diagnosis not present

## 2018-10-11 DIAGNOSIS — N183 Chronic kidney disease, stage 3 (moderate): Secondary | ICD-10-CM | POA: Diagnosis not present

## 2018-10-11 DIAGNOSIS — R7301 Impaired fasting glucose: Secondary | ICD-10-CM | POA: Diagnosis not present

## 2018-10-11 DIAGNOSIS — E559 Vitamin D deficiency, unspecified: Secondary | ICD-10-CM | POA: Diagnosis not present

## 2018-10-11 DIAGNOSIS — I10 Essential (primary) hypertension: Secondary | ICD-10-CM | POA: Diagnosis not present

## 2018-10-11 DIAGNOSIS — E782 Mixed hyperlipidemia: Secondary | ICD-10-CM | POA: Diagnosis not present

## 2018-10-15 DIAGNOSIS — N182 Chronic kidney disease, stage 2 (mild): Secondary | ICD-10-CM | POA: Diagnosis not present

## 2018-10-15 DIAGNOSIS — E559 Vitamin D deficiency, unspecified: Secondary | ICD-10-CM | POA: Diagnosis not present

## 2018-10-15 DIAGNOSIS — E782 Mixed hyperlipidemia: Secondary | ICD-10-CM | POA: Diagnosis not present

## 2018-10-15 DIAGNOSIS — I739 Peripheral vascular disease, unspecified: Secondary | ICD-10-CM | POA: Diagnosis not present

## 2018-10-15 DIAGNOSIS — Z0001 Encounter for general adult medical examination with abnormal findings: Secondary | ICD-10-CM | POA: Diagnosis not present

## 2018-10-15 DIAGNOSIS — I10 Essential (primary) hypertension: Secondary | ICD-10-CM | POA: Diagnosis not present

## 2018-10-15 DIAGNOSIS — Z6836 Body mass index (BMI) 36.0-36.9, adult: Secondary | ICD-10-CM | POA: Diagnosis not present

## 2018-10-15 DIAGNOSIS — M8580 Other specified disorders of bone density and structure, unspecified site: Secondary | ICD-10-CM | POA: Diagnosis not present

## 2018-10-22 ENCOUNTER — Encounter: Payer: Self-pay | Admitting: Internal Medicine

## 2018-11-26 DIAGNOSIS — Z1231 Encounter for screening mammogram for malignant neoplasm of breast: Secondary | ICD-10-CM | POA: Diagnosis not present

## 2018-12-12 ENCOUNTER — Ambulatory Visit: Payer: Commercial Managed Care - HMO

## 2019-01-31 DIAGNOSIS — R42 Dizziness and giddiness: Secondary | ICD-10-CM | POA: Diagnosis not present

## 2019-01-31 DIAGNOSIS — E785 Hyperlipidemia, unspecified: Secondary | ICD-10-CM | POA: Diagnosis not present

## 2019-01-31 DIAGNOSIS — Z79899 Other long term (current) drug therapy: Secondary | ICD-10-CM | POA: Diagnosis not present

## 2019-01-31 DIAGNOSIS — Z7982 Long term (current) use of aspirin: Secondary | ICD-10-CM | POA: Diagnosis not present

## 2019-01-31 DIAGNOSIS — Z955 Presence of coronary angioplasty implant and graft: Secondary | ICD-10-CM | POA: Diagnosis not present

## 2019-01-31 DIAGNOSIS — I252 Old myocardial infarction: Secondary | ICD-10-CM | POA: Diagnosis not present

## 2019-01-31 DIAGNOSIS — I10 Essential (primary) hypertension: Secondary | ICD-10-CM | POA: Diagnosis not present

## 2019-03-04 ENCOUNTER — Other Ambulatory Visit: Payer: Self-pay

## 2019-03-04 ENCOUNTER — Ambulatory Visit (INDEPENDENT_AMBULATORY_CARE_PROVIDER_SITE_OTHER): Payer: Self-pay | Admitting: *Deleted

## 2019-03-04 DIAGNOSIS — Z1211 Encounter for screening for malignant neoplasm of colon: Secondary | ICD-10-CM

## 2019-03-04 MED ORDER — PEG 3350-KCL-NA BICARB-NACL 420 G PO SOLR: 4000.0000 mL | Freq: Once | ORAL | 0 refills | Status: AC

## 2019-03-04 NOTE — Patient Instructions (Addendum)
[name] [name]   December 30, 1950 MRN: [id number]    Procedure Date: 05/07/2019 Time to register: 11:00 am Place to register: [facility] Procedure Time: 12:00 pm Scheduled provider: Dr. Gala Romney  PREPARATION FOR COLONOSCOPY WITH TRI-LYTE SPLIT PREP  Please notify us immediately if you are diabetic, take iron supplements, or if you are on Coumadin or any other blood thinners.   Please hold the following medications:  See letter  You will need to purchase 1 fleet enema and 1 box of Bisacodyl '5mg'$  tablets.   2 DAYS BEFORE PROCEDURE:  DATE: 05/05/2019   DAY: Monday Begin clear liquid diet AFTER your lunch meal. NO SOLID FOODS after this point.  1 DAY BEFORE PROCEDURE:  DATE: 05/06/2019   DAY: Tuesday Continue clear liquids the entire day - NO SOLID FOOD.   Diabetic medications adjustments for today: See letter  At 2:00 pm:  Take 2 Bisacodyl tablets.   At 4:00pm:  Start drinking your solution. Make sure you mix well per instructions on the bottle. Try to drink 1 (one) 8 ounce glass every 10-15 minutes until you have consumed HALF the jug. You should complete by 6:00pm.You must keep the left over solution refrigerated until completed next day.  Continue clear liquids. You must drink plenty of clear liquids to prevent dehyration and kidney failure.     DAY OF PROCEDURE:   DATE: 05/07/2019   DAY: Wednesday If you take medications for your heart, blood pressure or breathing, you may take these medications.  Diabetic medications adjustments for today: See letter  Five hours before your procedure time @  7:00 am:  Finish remaining amout of bowel prep, drinking 1 (one) 8 ounce glass every 10-15 minutes until complete. You have two hours to consume remaining prep.   Three hours before your procedure time @ 9:00 am:  Nothing by mouth.   At least one hour before going to the hospital:  Give yourself one Fleet enema. You may take your morning medications with sip of water unless we have  instructed otherwise.      Please see below for Dietary Information.  CLEAR LIQUIDS INCLUDE:  Water Jello (NOT red in color)   Ice Popsicles (NOT red in color)   Tea (sugar ok, no milk/cream) Powdered fruit flavored drinks  Coffee (sugar ok, no milk/cream) Gatorade/ Lemonade/ Kool-Aid  (NOT red in color)   Juice: apple, white grape, white cranberry Soft drinks  Clear bullion, consomme, broth (fat free beef/chicken/vegetable)  Carbonated beverages (any kind)  Strained chicken noodle soup Hard Candy   Remember: Clear liquids are liquids that will allow you to see your fingers on the other side of a clear glass. Be sure liquids are NOT red in color, and not cloudy, but CLEAR.  DO NOT EAT OR DRINK ANY OF THE FOLLOWING:  Dairy products of any kind   Cranberry juice Tomato juice / V8 juice   Grapefruit juice Orange juice     Red grape juice  Do not eat any solid foods, including such foods as: cereal, oatmeal, yogurt, fruits, vegetables, creamed soups, eggs, bread, crackers, pureed foods in a blender, etc.   HELPFUL HINTS FOR DRINKING PREP SOLUTION:   Make sure prep is extremely cold. Mix and refrigerate the the morning of the prep. You may also put in the freezer.   You may try mixing some Crystal Light or Country Time Lemonade if you prefer. Mix in small amounts; add more if necessary.  Try drinking through a straw  Rinse mouth with water or a mouthwash between glasses, to remove after-taste.  Try sipping on a cold beverage /ice/ popsicles between glasses of prep.  Place a piece of sugar-free hard candy in mouth between glasses.  If you become nauseated, try consuming smaller amounts, or stretch out the time between glasses. Stop for 30-60 minutes, then slowly start back drinking.        OTHER INSTRUCTIONS  You will need a responsible adult at least 68 years of age to accompany you and drive you home. This person must remain in the waiting room during your procedure. The  hospital will cancel your procedure if you do not have a responsible adult with you.   1. Wear loose fitting clothing that is easily removed. 2. Leave jewelry and other valuables at home.  3. Remove all body piercing jewelry and leave at home. 4. Total time from sign-in until discharge is approximately 2-3 hours. 5. You should go home directly after your procedure and rest. You can resume normal activities the day after your procedure. 6. The day of your procedure you should not:  Drive  Make legal decisions  Operate machinery  Drink alcohol  Return to work   You may call the office (Dept: [phone]) before 5:00pm, or page the doctor on call [phone]) after 5:00pm, for further instructions, if necessary.   Insurance Information YOU WILL NEED TO CHECK WITH YOUR INSURANCE COMPANY FOR THE BENEFITS OF COVERAGE YOU HAVE FOR THIS PROCEDURE.  UNFORTUNATELY, NOT ALL INSURANCE COMPANIES HAVE BENEFITS TO COVER ALL OR PART OF THESE TYPES OF PROCEDURES.  IT IS YOUR RESPONSIBILITY TO CHECK YOUR BENEFITS, HOWEVER, WE WILL BE GLAD TO ASSIST YOU WITH ANY CODES YOUR INSURANCE COMPANY MAY NEED.    PLEASE NOTE THAT MOST INSURANCE COMPANIES WILL NOT COVER A SCREENING COLONOSCOPY FOR PEOPLE UNDER THE AGE OF 50  IF YOU HAVE BCBS INSURANCE, YOU MAY HAVE BENEFITS FOR A SCREENING COLONOSCOPY BUT IF POLYPS ARE FOUND THE DIAGNOSIS WILL CHANGE AND THEN YOU MAY HAVE A DEDUCTIBLE THAT WILL NEED TO BE MET. SO PLEASE MAKE SURE YOU CHECK YOUR BENEFITS FOR A SCREENING COLONOSCOPY AS WELL AS A DIAGNOSTIC COLONOSCOPY.

## 2019-03-04 NOTE — Progress Notes (Addendum)
Gastroenterology Pre-Procedure Review  Request Date: 03/04/2019 Requesting Physician: Dr. Pleas Koch @ [facility], Last TCS 09/10/2007 done by Dr. Gala Romney, no polyps  PATIENT REVIEW QUESTIONS: The patient responded to the following health history questions as indicated:    1. Diabetes Melitis: Yes 2. Joint replacements in the past 12 months: No 3. Major health problems in the past 3 months: No 4. Has an artificial valve or MVP: No 5. Has a defibrillator: No 6. Has been advised in past to take antibiotics in advance of a procedure like teeth cleaning: No 7. Family history of colon cancer: No  8. Alcohol Use: No 9. History of sleep apnea: No  10. History of coronary artery or other vascular stents placed within the last 12 months: No, stent placed in 2004 11. History of any prior anesthesia complications:  No  ALLERGIES:    Patient reports the following regarding taking any blood thinners:   Plavix? No Aspirin? Yes Coumadin? No Brilinta? No Xarelto? No Eliquis? No Pradaxa? No Savaysa? No Effient? No  Patient confirms/reports the following medications:  Current Outpatient Medications  Medication Sig Dispense Refill  . amLODipine (NORVASC) 10 MG tablet Take 10 mg by mouth daily.      Marland Kitchen aspirin 81 MG tablet Take 81 mg by mouth daily.      . Cholecalciferol (VITAMIN D3) 2000 UNITS TABS Take 1 tablet by mouth daily.    . hydrochlorothiazide (HYDRODIURIL) 25 MG tablet Take 25 mg by mouth daily.      Marland Kitchen lisinopril (PRINIVIL,ZESTRIL) 40 MG tablet Take 40 mg by mouth daily.      . metFORMIN (GLUCOPHAGE) 500 MG tablet Take 1 tablet by mouth 2 (two) times daily.    . pravastatin (PRAVACHOL) 80 MG tablet Take 80 mg by mouth daily.       No current facility-administered medications for this visit.     Patient confirms/reports the following allergies:  No Known Allergies  No orders of the defined types were placed in this encounter.   AUTHORIZATION INFORMATION Primary Insurance: [facility],  ID #: [id number] Pre-Cert / Josem Kaufmann required: No, not required  SCHEDULE INFORMATION: Procedure has been scheduled as follows:  Date: 05/07/2019, Time: 12:00  Location: [facility] with Dr. Gala Romney  This Gastroenterology Pre-Precedure Review Form is being routed to the following provider(s): Roseanne Kaufman, NP

## 2019-03-05 ENCOUNTER — Encounter: Payer: Self-pay | Admitting: *Deleted

## 2019-03-05 NOTE — Progress Notes (Signed)
No Glucophage day of procedure

## 2019-03-05 NOTE — Addendum Note (Signed)
Addended by: Metro Kung on: 03/05/2019 02:05 PM   Modules accepted: Orders, SmartSet

## 2019-03-05 NOTE — Progress Notes (Signed)
Mailed letter with diabetes medication adjustments.   

## 2019-03-26 DIAGNOSIS — E785 Hyperlipidemia, unspecified: Secondary | ICD-10-CM | POA: Diagnosis not present

## 2019-03-26 DIAGNOSIS — I10 Essential (primary) hypertension: Secondary | ICD-10-CM | POA: Diagnosis not present

## 2019-04-22 DIAGNOSIS — N183 Chronic kidney disease, stage 3 unspecified: Secondary | ICD-10-CM | POA: Diagnosis not present

## 2019-04-22 DIAGNOSIS — E559 Vitamin D deficiency, unspecified: Secondary | ICD-10-CM | POA: Diagnosis not present

## 2019-04-22 DIAGNOSIS — R7301 Impaired fasting glucose: Secondary | ICD-10-CM | POA: Diagnosis not present

## 2019-04-22 DIAGNOSIS — R5382 Chronic fatigue, unspecified: Secondary | ICD-10-CM | POA: Diagnosis not present

## 2019-04-22 DIAGNOSIS — I10 Essential (primary) hypertension: Secondary | ICD-10-CM | POA: Diagnosis not present

## 2019-04-22 DIAGNOSIS — E782 Mixed hyperlipidemia: Secondary | ICD-10-CM | POA: Diagnosis not present

## 2019-04-25 DIAGNOSIS — M2550 Pain in unspecified joint: Secondary | ICD-10-CM | POA: Diagnosis not present

## 2019-04-25 DIAGNOSIS — I739 Peripheral vascular disease, unspecified: Secondary | ICD-10-CM | POA: Diagnosis not present

## 2019-04-25 DIAGNOSIS — N182 Chronic kidney disease, stage 2 (mild): Secondary | ICD-10-CM | POA: Diagnosis not present

## 2019-04-25 DIAGNOSIS — R7301 Impaired fasting glucose: Secondary | ICD-10-CM | POA: Diagnosis not present

## 2019-04-25 DIAGNOSIS — Z23 Encounter for immunization: Secondary | ICD-10-CM | POA: Diagnosis not present

## 2019-04-25 DIAGNOSIS — Z6836 Body mass index (BMI) 36.0-36.9, adult: Secondary | ICD-10-CM | POA: Diagnosis not present

## 2019-04-25 DIAGNOSIS — E782 Mixed hyperlipidemia: Secondary | ICD-10-CM | POA: Diagnosis not present

## 2019-04-25 DIAGNOSIS — I10 Essential (primary) hypertension: Secondary | ICD-10-CM | POA: Diagnosis not present

## 2019-05-05 ENCOUNTER — Other Ambulatory Visit (HOSPITAL_COMMUNITY)
Admission: RE | Admit: 2019-05-05 | Discharge: 2019-05-05 | Disposition: A | Discharge status: Home / Self Care | Payer: Medicare HMO | Source: Ambulatory Visit | Attending: Internal Medicine | Admitting: Internal Medicine

## 2019-05-05 ENCOUNTER — Other Ambulatory Visit: Payer: Self-pay

## 2019-05-05 DIAGNOSIS — Z20828 Contact with and (suspected) exposure to other viral communicable diseases: Secondary | ICD-10-CM | POA: Diagnosis not present

## 2019-05-05 DIAGNOSIS — Z01812 Encounter for preprocedural laboratory examination: Secondary | ICD-10-CM | POA: Diagnosis not present

## 2019-05-05 LAB — SARS CORONAVIRUS 2 (TAT 6-24 HRS): SARS Coronavirus 2: NEGATIVE

## 2019-05-07 ENCOUNTER — Other Ambulatory Visit: Payer: Self-pay

## 2019-05-07 ENCOUNTER — Ambulatory Visit (HOSPITAL_COMMUNITY)
Admission: RE | Admit: 2019-05-07 | Discharge: 2019-05-07 | Disposition: A | Discharge status: Home / Self Care | Payer: Medicare HMO | Attending: Internal Medicine | Admitting: Internal Medicine

## 2019-05-07 ENCOUNTER — Encounter (HOSPITAL_COMMUNITY): Payer: Self-pay | Admitting: *Deleted

## 2019-05-07 ENCOUNTER — Encounter (HOSPITAL_COMMUNITY)
Admission: RE | Disposition: A | Discharge status: Home / Self Care | Payer: Self-pay | Source: Home / Self Care | Attending: Internal Medicine

## 2019-05-07 DIAGNOSIS — Z833 Family history of diabetes mellitus: Secondary | ICD-10-CM | POA: Insufficient documentation

## 2019-05-07 DIAGNOSIS — I252 Old myocardial infarction: Secondary | ICD-10-CM | POA: Insufficient documentation

## 2019-05-07 DIAGNOSIS — N182 Chronic kidney disease, stage 2 (mild): Secondary | ICD-10-CM | POA: Diagnosis not present

## 2019-05-07 DIAGNOSIS — Z79899 Other long term (current) drug therapy: Secondary | ICD-10-CM | POA: Diagnosis not present

## 2019-05-07 DIAGNOSIS — Z7984 Long term (current) use of oral hypoglycemic drugs: Secondary | ICD-10-CM | POA: Insufficient documentation

## 2019-05-07 DIAGNOSIS — K621 Rectal polyp: Secondary | ICD-10-CM | POA: Diagnosis not present

## 2019-05-07 DIAGNOSIS — K589 Irritable bowel syndrome without diarrhea: Secondary | ICD-10-CM | POA: Insufficient documentation

## 2019-05-07 DIAGNOSIS — I129 Hypertensive chronic kidney disease with stage 1 through stage 4 chronic kidney disease, or unspecified chronic kidney disease: Secondary | ICD-10-CM | POA: Insufficient documentation

## 2019-05-07 DIAGNOSIS — E1122 Type 2 diabetes mellitus with diabetic chronic kidney disease: Secondary | ICD-10-CM | POA: Diagnosis not present

## 2019-05-07 DIAGNOSIS — I2510 Atherosclerotic heart disease of native coronary artery without angina pectoris: Secondary | ICD-10-CM | POA: Insufficient documentation

## 2019-05-07 DIAGNOSIS — Z1211 Encounter for screening for malignant neoplasm of colon: Secondary | ICD-10-CM | POA: Diagnosis not present

## 2019-05-07 DIAGNOSIS — Z8249 Family history of ischemic heart disease and other diseases of the circulatory system: Secondary | ICD-10-CM | POA: Diagnosis not present

## 2019-05-07 DIAGNOSIS — Z7982 Long term (current) use of aspirin: Secondary | ICD-10-CM | POA: Insufficient documentation

## 2019-05-07 DIAGNOSIS — M1990 Unspecified osteoarthritis, unspecified site: Secondary | ICD-10-CM | POA: Diagnosis not present

## 2019-05-07 DIAGNOSIS — E785 Hyperlipidemia, unspecified: Secondary | ICD-10-CM | POA: Insufficient documentation

## 2019-05-07 DIAGNOSIS — K6289 Other specified diseases of anus and rectum: Secondary | ICD-10-CM | POA: Diagnosis not present

## 2019-05-07 HISTORY — PX: COLONOSCOPY: SHX5424

## 2019-05-07 LAB — GLUCOSE, CAPILLARY: Glucose-Capillary: 86 mg/dL (ref 70–99)

## 2019-05-07 SURGERY — COLONOSCOPY
Anesthesia: Moderate Sedation

## 2019-05-07 MED ORDER — STERILE WATER FOR IRRIGATION IR SOLN
Status: DC | PRN
  Administered 2019-05-07 (×2): 2.5 mL

## 2019-05-07 MED ORDER — MIDAZOLAM HCL 5 MG/5ML IJ SOLN
INTRAMUSCULAR | Status: DC | PRN
  Administered 2019-05-07: 2 mg via INTRAVENOUS
  Administered 2019-05-07 (×5): 1 mg via INTRAVENOUS

## 2019-05-07 MED ORDER — MIDAZOLAM HCL 5 MG/5ML IJ SOLN
INTRAMUSCULAR | Status: AC
  Filled 2019-05-07: qty 10

## 2019-05-07 MED ORDER — MEPERIDINE HCL 100 MG/ML IJ SOLN
INTRAMUSCULAR | Status: DC | PRN
  Administered 2019-05-07: 25 mg via INTRAVENOUS
  Administered 2019-05-07: 10 mg via INTRAVENOUS
  Administered 2019-05-07: 15 mg via INTRAVENOUS

## 2019-05-07 MED ORDER — ONDANSETRON HCL 4 MG/2ML IJ SOLN
INTRAMUSCULAR | Status: DC | PRN
  Administered 2019-05-07: 4 mg via INTRAVENOUS

## 2019-05-07 MED ORDER — ONDANSETRON HCL 4 MG/2ML IJ SOLN
INTRAMUSCULAR | Status: AC
  Filled 2019-05-07: qty 2

## 2019-05-07 MED ORDER — SODIUM CHLORIDE 0.9 % IV SOLN
INTRAVENOUS | Status: DC
  Administered 2019-05-07: 11:00:00 via INTRAVENOUS

## 2019-05-07 MED ORDER — MEPERIDINE HCL 50 MG/ML IJ SOLN
INTRAMUSCULAR | Status: AC
  Filled 2019-05-07: qty 1

## 2019-05-07 NOTE — Discharge Instructions (Signed)
Colonoscopy Discharge Instructions  Read the instructions outlined below and refer to this sheet in the next few weeks. These discharge instructions provide you with general information on caring for yourself after you leave the hospital. Your doctor may also give you specific instructions. While your treatment has been planned according to the most current medical practices available, unavoidable complications occasionally occur. If you have any problems or questions after discharge, call Dr. Gala Romney at [phone]. ACTIVITY  You may resume your regular activity, but move at a slower pace for the next 24 hours.   Take frequent rest periods for the next 24 hours.   Walking will help get rid of the air and reduce the bloated feeling in your belly (abdomen).   No driving for 24 hours (because of the medicine (anesthesia) used during the test).    Do not sign any important legal documents or operate any machinery for 24 hours (because of the anesthesia used during the test).  NUTRITION  Drink plenty of fluids.   You may resume your normal diet as instructed by your doctor.   Begin with a light meal and progress to your normal diet. Heavy or fried foods are harder to digest and may make you feel sick to your stomach (nauseated).   Avoid alcoholic beverages for 24 hours or as instructed.  MEDICATIONS  You may resume your normal medications unless your doctor tells you otherwise.  WHAT YOU CAN EXPECT TODAY  Some feelings of bloating in the abdomen.   Passage of more gas than usual.   Spotting of blood in your stool or on the toilet paper.  IF YOU HAD POLYPS REMOVED DURING THE COLONOSCOPY:  No aspirin products for 7 days or as instructed.   No alcohol for 7 days or as instructed.   Eat a soft diet for the next 24 hours.  FINDING OUT THE RESULTS OF YOUR TEST Not all test results are available during your visit. If your test results are not back during the visit, make an appointment  with your caregiver to find out the results. Do not assume everything is normal if you have not heard from your caregiver or the medical facility. It is important for you to follow up on all of your test results.  SEEK IMMEDIATE MEDICAL ATTENTION IF:  You have more than a spotting of blood in your stool.   Your belly is swollen (abdominal distention).   You are nauseated or vomiting.   You have a temperature over 101.   You have abdominal pain or discomfort that is severe or gets worse throughout the day.   Colon polyp information provided  Further recommendations to follow pending review of pathology report  You may pass a small amount of blood with your next bowel movement or 2 but it will go away  At patient's request, I called Levada Dy at [phone].  Reviewed results.    Colon Polyps  Polyps are tissue growths inside the body. Polyps can grow in many places, including the large intestine (colon). A polyp may be a round bump or a mushroom-shaped growth. You could have one polyp or several. Most colon polyps are noncancerous (benign). However, some colon polyps can become cancerous over time. Finding and removing the polyps early can help prevent this. What are the causes? The exact cause of colon polyps is not known. What increases the risk? You are more likely to develop this condition if you:  Have a family history of colon cancer or colon  polyps.  Are older than 32 or older than 45 if you are African American.  Have inflammatory bowel disease, such as ulcerative colitis or Crohn's disease.  Have certain hereditary conditions, such as: ? Familial adenomatous polyposis. ? Lynch syndrome. ? Turcot syndrome. ? Peutz-Jeghers syndrome.  Are overweight.  Smoke cigarettes.  Do not get enough exercise.  Drink too much alcohol.  Eat a diet that is high in fat and red meat and low in fiber.  Had childhood cancer that was treated with abdominal radiation. What are  the signs or symptoms? Most polyps do not cause symptoms. If you have symptoms, they may include:  Blood coming from your rectum when having a bowel movement.  Blood in your stool. The stool may look dark red or black.  Abdominal pain.  A change in bowel habits, such as constipation or diarrhea. How is this diagnosed? This condition is diagnosed with a colonoscopy. This is a procedure in which a lighted, flexible scope is inserted into the anus and then passed into the colon to examine the area. Polyps are sometimes found when a colonoscopy is done as part of routine cancer screening tests. How is this treated? Treatment for this condition involves removing any polyps that are found. Most polyps can be removed during a colonoscopy. Those polyps will then be tested for cancer. Additional treatment may be needed depending on the results of testing. Follow these instructions at home: Lifestyle  Maintain a healthy weight, or lose weight if recommended by your health care provider.  Exercise every day or as told by your health care provider.  Do not use any products that contain nicotine or tobacco, such as cigarettes and e-cigarettes. If you need help quitting, ask your health care provider.  If you drink alcohol, limit how much you have: ? 0-1 drink a day for women. ? 0-2 drinks a day for men.  Be aware of how much alcohol is in your drink. In the [place] one drink equals one 12 oz bottle of beer (355 mL), one 5 oz glass of wine (148 mL), or one 1 oz shot of hard liquor (44 mL). Eating and drinking   Eat foods that are high in fiber, such as fruits, vegetables, and whole grains.  Eat foods that are high in calcium and vitamin D, such as milk, cheese, yogurt, eggs, liver, fish, and broccoli.  Limit foods that are high in fat, such as fried foods and desserts.  Limit the amount of red meat and processed meat you eat, such as hot dogs, sausage, bacon, and lunch meats. General  instructions  Keep all follow-up visits as told by your health care provider. This is important. ? This includes having regularly scheduled colonoscopies. ? Talk to your health care provider about when you need a colonoscopy. Contact a health care provider if:  You have new or worsening bleeding during a bowel movement.  You have new or increased blood in your stool.  You have a change in bowel habits.  You lose weight for no known reason. Summary  Polyps are tissue growths inside the body. Polyps can grow in many places, including the colon.  Most colon polyps are noncancerous (benign), but some can become cancerous over time.  This condition is diagnosed with a colonoscopy.  Treatment for this condition involves removing any polyps that are found. Most polyps can be removed during a colonoscopy. This information is not intended to replace advice given to you by your health care provider.  Make sure you discuss any questions you have with your health care provider. Document Released: 03/08/2004 Document Revised: 09/27/2017 Document Reviewed: 09/27/2017 Elsevier Patient Education  2020 [employer].

## 2019-05-07 NOTE — H&P (Signed)
@LOGO @   Primary Care Physician:  , MD Primary Gastroenterologist:  Dr. Juliette Alcide  Pre-Procedure History & Physical: HPI:  [name] [name] is a 68 y.o. female is here for a screening colonoscopy.  Negative colonoscopy 2009.  No bowel symptoms.  Past Medical History:  Diagnosis Date  . Arthritis   . Carpal tunnel syndrome   . Chronic kidney disease (CKD), stage II (mild)   . Constipation   . Coronary artery disease    NSTEMI 2004, DES to ostial RCA  . DDD (degenerative disc disease)   . Essential hypertension   . GERD (gastroesophageal reflux disease)   . Hyperlipidemia   . IBS (irritable bowel syndrome)   . Low back pain   . Myocardial infarction (HCC) 2004  . Postmenopausal   . Pulmonary nodule   . Type 2 diabetes mellitus (HCC)     Past Surgical History:  Procedure Laterality Date  . ABDOMINAL HYSTERECTOMY    . CARDIAC CATHETERIZATION N/A 04/28/2015   Procedure: Left Heart Cath and Coronary Angiography;  Surgeon: 13/07/2014, MD;  Location: [facility] INVASIVE CV LAB;  Service: Cardiovascular;  Laterality: N/A;  . Cervical disectomy and fusion    . Cholestectomy    . Excision of ovarian cyst    . L4-5 with pedicle screws and rods    . Right shoulder RTC      Prior to Admission medications   Medication Sig Start Date End Date Taking? Authorizing Provider  amLODipine (NORVASC) 10 MG tablet Take 10 mg by mouth daily.     Yes [provider]  aspirin 81 MG tablet Take 81 mg by mouth daily.     Yes [provider]  atorvastatin (LIPITOR) 40 MG tablet Take 40 mg by mouth daily. 04/28/19  Yes [provider]  Cholecalciferol (VITAMIN D3) 2000 UNITS TABS Take 1 tablet by mouth daily.   Yes [provider]  hydrochlorothiazide (HYDRODIURIL) 25 MG tablet Take 25 mg by mouth daily.     Yes [provider]  lisinopril (PRINIVIL,ZESTRIL) 40 MG tablet Take 40 mg by mouth daily.     Yes [provider]  metFORMIN  (GLUCOPHAGE) 500 MG tablet Take 500 mg by mouth daily with breakfast.  03/11/15  Yes [provider]    Allergies as of 03/05/2019  . (No Known Allergies)    Family History  Problem Relation Age of Onset  . Stroke Mother   . Diabetes Mellitus II Mother   . Kidney disease Father   . Hypertension Brother   . Diabetes Mellitus II Brother   . Diabetes Mellitus II Sister     Social History   Socioeconomic History  . Marital status: Widowed    Spouse name: Not on file  . Number of children: Not on file  . Years of education: Not on file  . Highest education level: Not on file  Occupational History  . 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 Financial resource strain: Not on file  . Food insecurity    Worry: Not on file    Inability: Not on file  . Transportation needs    Medical: Not on file    Non-medical: Not on file  Tobacco Use  . Smoking status: Never Smoker  . Smokeless tobacco: Never Used  Substance and Sexual Activity  . Alcohol use: No    Alcohol/week: 0.0 standard drinks  . Drug use: No  . Sexual activity: Never  Lifestyle  . Physical  activity    Days per week: Not on file    Minutes per session: Not on file  . Stress: Not on file  Relationships  . Social Herbalist on phone: Not on file    Gets together: Not on file    Attends religious service: Not on file    Active member of club or organization: Not on file    Attends meetings of clubs or organizations: Not on file    Relationship status: Not on file  . Intimate partner violence    Fear of current or ex partner: Not on file    Emotionally abused: Not on file    Physically abused: Not on file    Forced sexual activity: Not on file  Other Topics Concern  . Not on file  Social History Narrative   Married and lives with husband   Disabled due to lumbosacral spine disease   10th grade education       Review of Systems: See HPI, otherwise negative ROS  Physical  Exam: BP (!) 172/84   Pulse 90   Resp 19   Ht 5\' 7"  (1.702 m)   Wt 104.3 kg   SpO2 99%   BMI 36.02 kg/m  General:   Alert,  Well-developed, well-nourished, pleasant and cooperative in NAD Neck:  Supple; no masses or thyromegaly. Lungs:  Clear throughout to auscultation.   No wheezes, crackles, or rhonchi. No acute distress. Heart:  Regular rate and rhythm; no murmurs, clicks, rubs,  or gallops. Abdomen:  Soft, nontender and nondistended. No masses, hepatosplenomegaly or hernias noted. Normal bowel sounds, without guarding, and without rebound.    Impression/Plan: [name] [name] is now here to undergo a screening colonoscopy.  Average rescreening examination.  Risks, benefits, limitations, imponderables and alternatives regarding colonoscopy have been reviewed with the patient. Questions have been answered. All parties agreeable.     Notice:  This dictation was prepared with Dragon dictation along with smaller phrase technology. Any transcriptional errors that result from this process are unintentional and may not be corrected upon review.

## 2019-05-07 NOTE — Op Note (Signed)
[facility] Patient Name: Vonetta Foulk Procedure Date: 05/07/2019 10:26 AM MRN: [id number] Date of Birth: [birth year] Attending MD: Gennette Pac , MD CSN: [id number] Age: 68 Admit Type: Outpatient Procedure:                Colonoscopy Indications:              Screening for colorectal malignant neoplasm Providers:                Gennette Pac, MD, Nena Polio, RN, Pandora Leiter, Technician Referring MD:              Medicines:                Midazolam 7 mg IV, Meperidine 50 mg IV Complications:            No immediate complications. Estimated Blood Loss:     Estimated blood loss was minimal. Procedure:                Pre-Anesthesia Assessment:                           - Prior to the procedure, a History and Physical                            was performed, and patient medications and                            allergies were reviewed. The patient's tolerance of                            previous anesthesia was also reviewed. The risks                            and benefits of the procedure and the sedation                            options and risks were discussed with the patient.                            All questions were answered, and informed consent                            was obtained. Prior Anticoagulants: The patient has                            taken no previous anticoagulant or antiplatelet                            agents. ASA Grade Assessment: II - A patient with                            mild systemic disease. After reviewing the risks  and benefits, the patient was deemed in                            satisfactory condition to undergo the procedure.                           After obtaining informed consent, the colonoscope                            was passed under direct vision. Throughout the                            procedure, the patient's blood pressure, pulse, and                    oxygen saturations were monitored continuously. The                            CF-[id number] ([id number] scope was introduced through                            the anus and advanced to the the cecum, identified                            by appendiceal orifice and ileocecal valve. The                            colonoscopy was performed without difficulty. The                            patient tolerated the procedure well. The quality                            of the bowel preparation was adequate. Scope In: 11:10:51 AM Scope Out: 11:34:25 AM Scope Withdrawal Time: 0 hours 13 minutes 8 seconds  Total Procedure Duration: 0 hours 23 minutes 34 seconds  Findings:      The perianal and digital rectal examinations were normal.      A 8 mm polyp was found in the rectum. The polyp was sessile. The polyp       was removed with a cold snare. Resection and retrieval were complete.       Estimated blood loss was minimal.      The exam was otherwise without abnormality on direct and retroflexion       views. Impression:               - One 1 mm polyp in the rectum, removed with a cold                            snare. Resected and retrieved.                           - The examination was otherwise normal on direct                            and  retroflexion views. Moderate Sedation:      Moderate (conscious) sedation was administered by the endoscopy nurse       and supervised by the endoscopist. The following parameters were       monitored: oxygen saturation, heart rate, blood pressure, respiratory       rate, EKG, adequacy of pulmonary ventilation, and response to care.       Total physician intraservice time was 30 minutes. Recommendation:           - Patient has a contact number available for                            emergencies. The signs and symptoms of potential                            delayed complications were discussed with the                            patient.  Return to normal activities tomorrow.                            Written discharge instructions were provided to the                            patient.                           - Resume previous diet.                           - Continue present medications.                           - Repeat colonoscopy for surveillance based on                            pathology results.                           - Return to GI office PRN. Procedure Code(s):        --- Professional ---                           [phone], Colonoscopy, flexible; with removal of                            tumor(s), polyp(s), or other lesion(s) by snare                            technique                           99153, Moderate sedation; each additional 15                            minutes intraservice time  G0500, Moderate sedation services provided by the                            same physician or other qualified health care                            professional performing a gastrointestinal                            endoscopic service that sedation supports,                            requiring the presence of an independent trained                            observer to assist in the monitoring of the                            patient's level of consciousness and physiological                            status; initial 15 minutes of intra-service time;                            patient age 69 years or older (additional time may                            be reported with [phone], as appropriate) Diagnosis Code(s):        --- Professional ---                           Z12.11, Encounter for screening for malignant                            neoplasm of colon                           K62.1, Rectal polyp CPT copyright 2019 American Medical Association. All rights reserved. The codes documented in this report are preliminary and upon coder review may  be revised to meet current compliance  requirements. Cristopher Estimable. Ramona Slinger, MD Norvel Richards, MD 05/07/2019 11:46:33 AM This report has been signed electronically. Number of Addenda: 0

## 2019-05-08 LAB — SURGICAL PATHOLOGY

## 2019-05-09 ENCOUNTER — Encounter: Payer: Self-pay | Admitting: Internal Medicine

## 2019-05-12 ENCOUNTER — Encounter (HOSPITAL_COMMUNITY): Payer: Self-pay | Admitting: Internal Medicine

## 2019-10-09 DIAGNOSIS — I10 Essential (primary) hypertension: Secondary | ICD-10-CM | POA: Diagnosis not present

## 2019-10-09 DIAGNOSIS — N183 Chronic kidney disease, stage 3 unspecified: Secondary | ICD-10-CM | POA: Diagnosis not present

## 2019-10-09 DIAGNOSIS — R7301 Impaired fasting glucose: Secondary | ICD-10-CM | POA: Diagnosis not present

## 2019-10-09 DIAGNOSIS — R5382 Chronic fatigue, unspecified: Secondary | ICD-10-CM | POA: Diagnosis not present

## 2019-10-09 DIAGNOSIS — E782 Mixed hyperlipidemia: Secondary | ICD-10-CM | POA: Diagnosis not present

## 2019-10-09 DIAGNOSIS — E559 Vitamin D deficiency, unspecified: Secondary | ICD-10-CM | POA: Diagnosis not present

## 2019-10-13 DIAGNOSIS — Z6836 Body mass index (BMI) 36.0-36.9, adult: Secondary | ICD-10-CM | POA: Diagnosis not present

## 2019-10-13 DIAGNOSIS — Z0001 Encounter for general adult medical examination with abnormal findings: Secondary | ICD-10-CM | POA: Diagnosis not present

## 2019-10-13 DIAGNOSIS — I10 Essential (primary) hypertension: Secondary | ICD-10-CM | POA: Diagnosis not present

## 2019-10-13 DIAGNOSIS — N182 Chronic kidney disease, stage 2 (mild): Secondary | ICD-10-CM | POA: Diagnosis not present

## 2019-10-13 DIAGNOSIS — I739 Peripheral vascular disease, unspecified: Secondary | ICD-10-CM | POA: Diagnosis not present

## 2019-10-13 DIAGNOSIS — M8580 Other specified disorders of bone density and structure, unspecified site: Secondary | ICD-10-CM | POA: Diagnosis not present

## 2019-10-13 DIAGNOSIS — R7301 Impaired fasting glucose: Secondary | ICD-10-CM | POA: Diagnosis not present

## 2019-10-13 DIAGNOSIS — M2550 Pain in unspecified joint: Secondary | ICD-10-CM | POA: Diagnosis not present

## 2019-10-24 DIAGNOSIS — E7849 Other hyperlipidemia: Secondary | ICD-10-CM | POA: Diagnosis not present

## 2019-10-24 DIAGNOSIS — I10 Essential (primary) hypertension: Secondary | ICD-10-CM | POA: Diagnosis not present

## 2019-11-24 DIAGNOSIS — E7849 Other hyperlipidemia: Secondary | ICD-10-CM | POA: Diagnosis not present

## 2019-11-24 DIAGNOSIS — I129 Hypertensive chronic kidney disease with stage 1 through stage 4 chronic kidney disease, or unspecified chronic kidney disease: Secondary | ICD-10-CM | POA: Diagnosis not present

## 2019-11-24 DIAGNOSIS — I739 Peripheral vascular disease, unspecified: Secondary | ICD-10-CM | POA: Diagnosis not present

## 2019-11-24 DIAGNOSIS — N183 Chronic kidney disease, stage 3 unspecified: Secondary | ICD-10-CM | POA: Diagnosis not present

## 2019-12-23 DIAGNOSIS — M2550 Pain in unspecified joint: Secondary | ICD-10-CM | POA: Diagnosis not present

## 2019-12-23 DIAGNOSIS — N182 Chronic kidney disease, stage 2 (mild): Secondary | ICD-10-CM | POA: Diagnosis not present

## 2019-12-23 DIAGNOSIS — I739 Peripheral vascular disease, unspecified: Secondary | ICD-10-CM | POA: Diagnosis not present

## 2019-12-23 DIAGNOSIS — Z6836 Body mass index (BMI) 36.0-36.9, adult: Secondary | ICD-10-CM | POA: Diagnosis not present

## 2019-12-26 DIAGNOSIS — I129 Hypertensive chronic kidney disease with stage 1 through stage 4 chronic kidney disease, or unspecified chronic kidney disease: Secondary | ICD-10-CM | POA: Diagnosis not present

## 2019-12-26 DIAGNOSIS — M170 Bilateral primary osteoarthritis of knee: Secondary | ICD-10-CM | POA: Diagnosis not present

## 2019-12-26 DIAGNOSIS — I2510 Atherosclerotic heart disease of native coronary artery without angina pectoris: Secondary | ICD-10-CM | POA: Diagnosis not present

## 2019-12-26 DIAGNOSIS — M7918 Myalgia, other site: Secondary | ICD-10-CM | POA: Diagnosis not present

## 2019-12-26 DIAGNOSIS — M7122 Synovial cyst of popliteal space [Baker], left knee: Secondary | ICD-10-CM | POA: Diagnosis not present

## 2019-12-26 DIAGNOSIS — N182 Chronic kidney disease, stage 2 (mild): Secondary | ICD-10-CM | POA: Diagnosis not present

## 2019-12-26 DIAGNOSIS — M7121 Synovial cyst of popliteal space [Baker], right knee: Secondary | ICD-10-CM | POA: Diagnosis not present

## 2019-12-26 DIAGNOSIS — I739 Peripheral vascular disease, unspecified: Secondary | ICD-10-CM | POA: Diagnosis not present

## 2019-12-30 DIAGNOSIS — M170 Bilateral primary osteoarthritis of knee: Secondary | ICD-10-CM | POA: Diagnosis not present

## 2019-12-30 DIAGNOSIS — M7121 Synovial cyst of popliteal space [Baker], right knee: Secondary | ICD-10-CM | POA: Diagnosis not present

## 2019-12-30 DIAGNOSIS — I739 Peripheral vascular disease, unspecified: Secondary | ICD-10-CM | POA: Diagnosis not present

## 2019-12-30 DIAGNOSIS — I2510 Atherosclerotic heart disease of native coronary artery without angina pectoris: Secondary | ICD-10-CM | POA: Diagnosis not present

## 2019-12-30 DIAGNOSIS — M7918 Myalgia, other site: Secondary | ICD-10-CM | POA: Diagnosis not present

## 2019-12-30 DIAGNOSIS — I129 Hypertensive chronic kidney disease with stage 1 through stage 4 chronic kidney disease, or unspecified chronic kidney disease: Secondary | ICD-10-CM | POA: Diagnosis not present

## 2019-12-30 DIAGNOSIS — M7122 Synovial cyst of popliteal space [Baker], left knee: Secondary | ICD-10-CM | POA: Diagnosis not present

## 2019-12-30 DIAGNOSIS — N182 Chronic kidney disease, stage 2 (mild): Secondary | ICD-10-CM | POA: Diagnosis not present

## 2020-01-01 DIAGNOSIS — N182 Chronic kidney disease, stage 2 (mild): Secondary | ICD-10-CM | POA: Diagnosis not present

## 2020-01-01 DIAGNOSIS — M170 Bilateral primary osteoarthritis of knee: Secondary | ICD-10-CM | POA: Diagnosis not present

## 2020-01-01 DIAGNOSIS — I129 Hypertensive chronic kidney disease with stage 1 through stage 4 chronic kidney disease, or unspecified chronic kidney disease: Secondary | ICD-10-CM | POA: Diagnosis not present

## 2020-01-01 DIAGNOSIS — I739 Peripheral vascular disease, unspecified: Secondary | ICD-10-CM | POA: Diagnosis not present

## 2020-01-01 DIAGNOSIS — M7918 Myalgia, other site: Secondary | ICD-10-CM | POA: Diagnosis not present

## 2020-01-01 DIAGNOSIS — M7121 Synovial cyst of popliteal space [Baker], right knee: Secondary | ICD-10-CM | POA: Diagnosis not present

## 2020-01-01 DIAGNOSIS — M7122 Synovial cyst of popliteal space [Baker], left knee: Secondary | ICD-10-CM | POA: Diagnosis not present

## 2020-01-01 DIAGNOSIS — I2510 Atherosclerotic heart disease of native coronary artery without angina pectoris: Secondary | ICD-10-CM | POA: Diagnosis not present

## 2020-01-06 DIAGNOSIS — M7121 Synovial cyst of popliteal space [Baker], right knee: Secondary | ICD-10-CM | POA: Diagnosis not present

## 2020-01-06 DIAGNOSIS — N182 Chronic kidney disease, stage 2 (mild): Secondary | ICD-10-CM | POA: Diagnosis not present

## 2020-01-06 DIAGNOSIS — M7122 Synovial cyst of popliteal space [Baker], left knee: Secondary | ICD-10-CM | POA: Diagnosis not present

## 2020-01-06 DIAGNOSIS — M170 Bilateral primary osteoarthritis of knee: Secondary | ICD-10-CM | POA: Diagnosis not present

## 2020-01-06 DIAGNOSIS — I129 Hypertensive chronic kidney disease with stage 1 through stage 4 chronic kidney disease, or unspecified chronic kidney disease: Secondary | ICD-10-CM | POA: Diagnosis not present

## 2020-01-06 DIAGNOSIS — M7918 Myalgia, other site: Secondary | ICD-10-CM | POA: Diagnosis not present

## 2020-01-06 DIAGNOSIS — I739 Peripheral vascular disease, unspecified: Secondary | ICD-10-CM | POA: Diagnosis not present

## 2020-01-06 DIAGNOSIS — I2510 Atherosclerotic heart disease of native coronary artery without angina pectoris: Secondary | ICD-10-CM | POA: Diagnosis not present

## 2020-01-08 DIAGNOSIS — I2510 Atherosclerotic heart disease of native coronary artery without angina pectoris: Secondary | ICD-10-CM | POA: Diagnosis not present

## 2020-01-08 DIAGNOSIS — M170 Bilateral primary osteoarthritis of knee: Secondary | ICD-10-CM | POA: Diagnosis not present

## 2020-01-08 DIAGNOSIS — I739 Peripheral vascular disease, unspecified: Secondary | ICD-10-CM | POA: Diagnosis not present

## 2020-01-08 DIAGNOSIS — I129 Hypertensive chronic kidney disease with stage 1 through stage 4 chronic kidney disease, or unspecified chronic kidney disease: Secondary | ICD-10-CM | POA: Diagnosis not present

## 2020-01-08 DIAGNOSIS — M7918 Myalgia, other site: Secondary | ICD-10-CM | POA: Diagnosis not present

## 2020-01-08 DIAGNOSIS — N182 Chronic kidney disease, stage 2 (mild): Secondary | ICD-10-CM | POA: Diagnosis not present

## 2020-01-08 DIAGNOSIS — M7122 Synovial cyst of popliteal space [Baker], left knee: Secondary | ICD-10-CM | POA: Diagnosis not present

## 2020-01-08 DIAGNOSIS — M7121 Synovial cyst of popliteal space [Baker], right knee: Secondary | ICD-10-CM | POA: Diagnosis not present

## 2020-01-14 DIAGNOSIS — I739 Peripheral vascular disease, unspecified: Secondary | ICD-10-CM | POA: Diagnosis not present

## 2020-01-14 DIAGNOSIS — N182 Chronic kidney disease, stage 2 (mild): Secondary | ICD-10-CM | POA: Diagnosis not present

## 2020-01-14 DIAGNOSIS — M170 Bilateral primary osteoarthritis of knee: Secondary | ICD-10-CM | POA: Diagnosis not present

## 2020-01-14 DIAGNOSIS — I2510 Atherosclerotic heart disease of native coronary artery without angina pectoris: Secondary | ICD-10-CM | POA: Diagnosis not present

## 2020-01-14 DIAGNOSIS — I129 Hypertensive chronic kidney disease with stage 1 through stage 4 chronic kidney disease, or unspecified chronic kidney disease: Secondary | ICD-10-CM | POA: Diagnosis not present

## 2020-01-14 DIAGNOSIS — M7918 Myalgia, other site: Secondary | ICD-10-CM | POA: Diagnosis not present

## 2020-01-14 DIAGNOSIS — M7122 Synovial cyst of popliteal space [Baker], left knee: Secondary | ICD-10-CM | POA: Diagnosis not present

## 2020-01-14 DIAGNOSIS — M7121 Synovial cyst of popliteal space [Baker], right knee: Secondary | ICD-10-CM | POA: Diagnosis not present

## 2020-01-16 DIAGNOSIS — M170 Bilateral primary osteoarthritis of knee: Secondary | ICD-10-CM | POA: Diagnosis not present

## 2020-01-16 DIAGNOSIS — M7121 Synovial cyst of popliteal space [Baker], right knee: Secondary | ICD-10-CM | POA: Diagnosis not present

## 2020-01-16 DIAGNOSIS — M7122 Synovial cyst of popliteal space [Baker], left knee: Secondary | ICD-10-CM | POA: Diagnosis not present

## 2020-01-16 DIAGNOSIS — N182 Chronic kidney disease, stage 2 (mild): Secondary | ICD-10-CM | POA: Diagnosis not present

## 2020-01-16 DIAGNOSIS — I739 Peripheral vascular disease, unspecified: Secondary | ICD-10-CM | POA: Diagnosis not present

## 2020-01-16 DIAGNOSIS — I129 Hypertensive chronic kidney disease with stage 1 through stage 4 chronic kidney disease, or unspecified chronic kidney disease: Secondary | ICD-10-CM | POA: Diagnosis not present

## 2020-01-16 DIAGNOSIS — M7918 Myalgia, other site: Secondary | ICD-10-CM | POA: Diagnosis not present

## 2020-01-16 DIAGNOSIS — I2510 Atherosclerotic heart disease of native coronary artery without angina pectoris: Secondary | ICD-10-CM | POA: Diagnosis not present

## 2020-01-20 DIAGNOSIS — M7918 Myalgia, other site: Secondary | ICD-10-CM | POA: Diagnosis not present

## 2020-01-20 DIAGNOSIS — M7121 Synovial cyst of popliteal space [Baker], right knee: Secondary | ICD-10-CM | POA: Diagnosis not present

## 2020-01-20 DIAGNOSIS — I739 Peripheral vascular disease, unspecified: Secondary | ICD-10-CM | POA: Diagnosis not present

## 2020-01-20 DIAGNOSIS — I2510 Atherosclerotic heart disease of native coronary artery without angina pectoris: Secondary | ICD-10-CM | POA: Diagnosis not present

## 2020-01-20 DIAGNOSIS — M7122 Synovial cyst of popliteal space [Baker], left knee: Secondary | ICD-10-CM | POA: Diagnosis not present

## 2020-01-20 DIAGNOSIS — I129 Hypertensive chronic kidney disease with stage 1 through stage 4 chronic kidney disease, or unspecified chronic kidney disease: Secondary | ICD-10-CM | POA: Diagnosis not present

## 2020-01-20 DIAGNOSIS — N182 Chronic kidney disease, stage 2 (mild): Secondary | ICD-10-CM | POA: Diagnosis not present

## 2020-01-20 DIAGNOSIS — M170 Bilateral primary osteoarthritis of knee: Secondary | ICD-10-CM | POA: Diagnosis not present

## 2020-01-22 DIAGNOSIS — M7122 Synovial cyst of popliteal space [Baker], left knee: Secondary | ICD-10-CM | POA: Diagnosis not present

## 2020-01-22 DIAGNOSIS — M7121 Synovial cyst of popliteal space [Baker], right knee: Secondary | ICD-10-CM | POA: Diagnosis not present

## 2020-01-22 DIAGNOSIS — N182 Chronic kidney disease, stage 2 (mild): Secondary | ICD-10-CM | POA: Diagnosis not present

## 2020-01-22 DIAGNOSIS — I129 Hypertensive chronic kidney disease with stage 1 through stage 4 chronic kidney disease, or unspecified chronic kidney disease: Secondary | ICD-10-CM | POA: Diagnosis not present

## 2020-01-22 DIAGNOSIS — I2510 Atherosclerotic heart disease of native coronary artery without angina pectoris: Secondary | ICD-10-CM | POA: Diagnosis not present

## 2020-01-22 DIAGNOSIS — M170 Bilateral primary osteoarthritis of knee: Secondary | ICD-10-CM | POA: Diagnosis not present

## 2020-01-22 DIAGNOSIS — I739 Peripheral vascular disease, unspecified: Secondary | ICD-10-CM | POA: Diagnosis not present

## 2020-01-22 DIAGNOSIS — M7918 Myalgia, other site: Secondary | ICD-10-CM | POA: Diagnosis not present

## 2020-01-23 DIAGNOSIS — E7849 Other hyperlipidemia: Secondary | ICD-10-CM | POA: Diagnosis not present

## 2020-01-23 DIAGNOSIS — I739 Peripheral vascular disease, unspecified: Secondary | ICD-10-CM | POA: Diagnosis not present

## 2020-01-23 DIAGNOSIS — I129 Hypertensive chronic kidney disease with stage 1 through stage 4 chronic kidney disease, or unspecified chronic kidney disease: Secondary | ICD-10-CM | POA: Diagnosis not present

## 2020-01-23 DIAGNOSIS — N183 Chronic kidney disease, stage 3 unspecified: Secondary | ICD-10-CM | POA: Diagnosis not present

## 2020-01-27 DIAGNOSIS — M7918 Myalgia, other site: Secondary | ICD-10-CM | POA: Diagnosis not present

## 2020-01-27 DIAGNOSIS — I739 Peripheral vascular disease, unspecified: Secondary | ICD-10-CM | POA: Diagnosis not present

## 2020-01-27 DIAGNOSIS — M7121 Synovial cyst of popliteal space [Baker], right knee: Secondary | ICD-10-CM | POA: Diagnosis not present

## 2020-01-27 DIAGNOSIS — I2510 Atherosclerotic heart disease of native coronary artery without angina pectoris: Secondary | ICD-10-CM | POA: Diagnosis not present

## 2020-01-27 DIAGNOSIS — M7122 Synovial cyst of popliteal space [Baker], left knee: Secondary | ICD-10-CM | POA: Diagnosis not present

## 2020-01-27 DIAGNOSIS — M170 Bilateral primary osteoarthritis of knee: Secondary | ICD-10-CM | POA: Diagnosis not present

## 2020-01-27 DIAGNOSIS — N182 Chronic kidney disease, stage 2 (mild): Secondary | ICD-10-CM | POA: Diagnosis not present

## 2020-01-27 DIAGNOSIS — I129 Hypertensive chronic kidney disease with stage 1 through stage 4 chronic kidney disease, or unspecified chronic kidney disease: Secondary | ICD-10-CM | POA: Diagnosis not present

## 2020-02-03 DIAGNOSIS — I2510 Atherosclerotic heart disease of native coronary artery without angina pectoris: Secondary | ICD-10-CM | POA: Diagnosis not present

## 2020-02-03 DIAGNOSIS — M7122 Synovial cyst of popliteal space [Baker], left knee: Secondary | ICD-10-CM | POA: Diagnosis not present

## 2020-02-03 DIAGNOSIS — I129 Hypertensive chronic kidney disease with stage 1 through stage 4 chronic kidney disease, or unspecified chronic kidney disease: Secondary | ICD-10-CM | POA: Diagnosis not present

## 2020-02-03 DIAGNOSIS — M7121 Synovial cyst of popliteal space [Baker], right knee: Secondary | ICD-10-CM | POA: Diagnosis not present

## 2020-02-03 DIAGNOSIS — M170 Bilateral primary osteoarthritis of knee: Secondary | ICD-10-CM | POA: Diagnosis not present

## 2020-02-03 DIAGNOSIS — I739 Peripheral vascular disease, unspecified: Secondary | ICD-10-CM | POA: Diagnosis not present

## 2020-02-03 DIAGNOSIS — M7918 Myalgia, other site: Secondary | ICD-10-CM | POA: Diagnosis not present

## 2020-02-03 DIAGNOSIS — N182 Chronic kidney disease, stage 2 (mild): Secondary | ICD-10-CM | POA: Diagnosis not present

## 2020-02-19 DIAGNOSIS — N183 Chronic kidney disease, stage 3 unspecified: Secondary | ICD-10-CM | POA: Diagnosis not present

## 2020-02-19 DIAGNOSIS — R202 Paresthesia of skin: Secondary | ICD-10-CM | POA: Diagnosis not present

## 2020-02-19 DIAGNOSIS — E782 Mixed hyperlipidemia: Secondary | ICD-10-CM | POA: Diagnosis not present

## 2020-02-19 DIAGNOSIS — I10 Essential (primary) hypertension: Secondary | ICD-10-CM | POA: Diagnosis not present

## 2020-02-19 DIAGNOSIS — R7301 Impaired fasting glucose: Secondary | ICD-10-CM | POA: Diagnosis not present

## 2020-02-23 DIAGNOSIS — Z6836 Body mass index (BMI) 36.0-36.9, adult: Secondary | ICD-10-CM | POA: Diagnosis not present

## 2020-02-23 DIAGNOSIS — R7301 Impaired fasting glucose: Secondary | ICD-10-CM | POA: Diagnosis not present

## 2020-02-23 DIAGNOSIS — G4733 Obstructive sleep apnea (adult) (pediatric): Secondary | ICD-10-CM | POA: Diagnosis not present

## 2020-02-23 DIAGNOSIS — I739 Peripheral vascular disease, unspecified: Secondary | ICD-10-CM | POA: Diagnosis not present

## 2020-02-23 DIAGNOSIS — M2550 Pain in unspecified joint: Secondary | ICD-10-CM | POA: Diagnosis not present

## 2020-02-23 DIAGNOSIS — N182 Chronic kidney disease, stage 2 (mild): Secondary | ICD-10-CM | POA: Diagnosis not present

## 2020-02-23 DIAGNOSIS — E782 Mixed hyperlipidemia: Secondary | ICD-10-CM | POA: Diagnosis not present

## 2020-02-23 DIAGNOSIS — I10 Essential (primary) hypertension: Secondary | ICD-10-CM | POA: Diagnosis not present

## 2020-02-26 DIAGNOSIS — H524 Presbyopia: Secondary | ICD-10-CM | POA: Diagnosis not present

## 2020-02-26 DIAGNOSIS — Z7984 Long term (current) use of oral hypoglycemic drugs: Secondary | ICD-10-CM | POA: Diagnosis not present

## 2020-02-26 DIAGNOSIS — E119 Type 2 diabetes mellitus without complications: Secondary | ICD-10-CM | POA: Diagnosis not present

## 2020-02-26 DIAGNOSIS — H16223 Keratoconjunctivitis sicca, not specified as Sjogren's, bilateral: Secondary | ICD-10-CM | POA: Diagnosis not present

## 2020-02-26 DIAGNOSIS — H5203 Hypermetropia, bilateral: Secondary | ICD-10-CM | POA: Diagnosis not present

## 2020-02-26 DIAGNOSIS — H2513 Age-related nuclear cataract, bilateral: Secondary | ICD-10-CM | POA: Diagnosis not present

## 2020-03-29 DIAGNOSIS — M1611 Unilateral primary osteoarthritis, right hip: Secondary | ICD-10-CM | POA: Diagnosis not present

## 2020-03-29 DIAGNOSIS — M1711 Unilateral primary osteoarthritis, right knee: Secondary | ICD-10-CM | POA: Diagnosis not present

## 2020-03-29 DIAGNOSIS — Z6837 Body mass index (BMI) 37.0-37.9, adult: Secondary | ICD-10-CM | POA: Diagnosis not present

## 2020-03-29 DIAGNOSIS — M2550 Pain in unspecified joint: Secondary | ICD-10-CM | POA: Diagnosis not present

## 2020-05-05 DIAGNOSIS — M2550 Pain in unspecified joint: Secondary | ICD-10-CM | POA: Diagnosis not present

## 2020-05-05 DIAGNOSIS — G4733 Obstructive sleep apnea (adult) (pediatric): Secondary | ICD-10-CM | POA: Diagnosis not present

## 2020-05-05 DIAGNOSIS — Z6837 Body mass index (BMI) 37.0-37.9, adult: Secondary | ICD-10-CM | POA: Diagnosis not present

## 2020-05-05 DIAGNOSIS — N182 Chronic kidney disease, stage 2 (mild): Secondary | ICD-10-CM | POA: Diagnosis not present

## 2020-05-05 DIAGNOSIS — R7301 Impaired fasting glucose: Secondary | ICD-10-CM | POA: Diagnosis not present

## 2020-05-05 DIAGNOSIS — Z23 Encounter for immunization: Secondary | ICD-10-CM | POA: Diagnosis not present

## 2020-05-05 DIAGNOSIS — E782 Mixed hyperlipidemia: Secondary | ICD-10-CM | POA: Diagnosis not present

## 2020-05-05 DIAGNOSIS — I10 Essential (primary) hypertension: Secondary | ICD-10-CM | POA: Diagnosis not present

## 2020-05-10 ENCOUNTER — Other Ambulatory Visit (HOSPITAL_BASED_OUTPATIENT_CLINIC_OR_DEPARTMENT_OTHER): Payer: Self-pay

## 2020-05-10 DIAGNOSIS — G4710 Hypersomnia, unspecified: Secondary | ICD-10-CM

## 2020-05-10 DIAGNOSIS — R5383 Other fatigue: Secondary | ICD-10-CM

## 2020-05-24 DIAGNOSIS — H2513 Age-related nuclear cataract, bilateral: Secondary | ICD-10-CM | POA: Diagnosis not present

## 2020-05-24 DIAGNOSIS — H11153 Pinguecula, bilateral: Secondary | ICD-10-CM | POA: Diagnosis not present

## 2020-05-24 DIAGNOSIS — E119 Type 2 diabetes mellitus without complications: Secondary | ICD-10-CM | POA: Diagnosis not present

## 2020-05-30 ENCOUNTER — Ambulatory Visit: Payer: Medicare HMO | Attending: Family Medicine | Admitting: Neurology

## 2020-05-30 ENCOUNTER — Other Ambulatory Visit: Payer: Self-pay

## 2020-05-30 DIAGNOSIS — G4733 Obstructive sleep apnea (adult) (pediatric): Secondary | ICD-10-CM | POA: Insufficient documentation

## 2020-05-30 DIAGNOSIS — Z7982 Long term (current) use of aspirin: Secondary | ICD-10-CM | POA: Diagnosis not present

## 2020-05-30 DIAGNOSIS — Z7984 Long term (current) use of oral hypoglycemic drugs: Secondary | ICD-10-CM | POA: Insufficient documentation

## 2020-05-30 DIAGNOSIS — G4710 Hypersomnia, unspecified: Secondary | ICD-10-CM

## 2020-05-30 DIAGNOSIS — Z79899 Other long term (current) drug therapy: Secondary | ICD-10-CM | POA: Insufficient documentation

## 2020-05-30 DIAGNOSIS — R5383 Other fatigue: Secondary | ICD-10-CM | POA: Diagnosis present

## 2020-06-17 NOTE — Procedures (Signed)
   [facility] Jerrica Thorman A. Gerilyn Pilgrim, MD     [email or web address]             NOCTURNAL POLYSOMNOGRAPHY   LOCATION: [facility]   Patient Name: [name] [name], [name] [name] Date: 05/30/2020 Gender: Female D.O.B: 1951/06/24 Age (years): 5 Referring Provider: Quintin Alto MD Height (inches): 66 Interpreting Physician: Beryle Beams MD, ABSM Weight (lbs): 231 RPSGT: Alfonso Ellis BMI: 37 MRN: [id number] Neck Size: 18.00 <br> <br> CLINICAL INFORMATION Sleep Study Type: NPSG    Indication for sleep study: N/A    Epworth Sleepiness Score:    SLEEP STUDY TECHNIQUE As per the AASM Manual for the Scoring of Sleep and Associated Events v2.3 (April 2016) with a hypopnea requiring 4% desaturations.  The channels recorded and monitored were frontal, central and occipital EEG, electrooculogram (EOG), submentalis EMG (chin), nasal and oral airflow, thoracic and abdominal wall motion, anterior tibialis EMG, snore microphone, electrocardiogram, and pulse oximetry.  MEDICATIONS Medications self-administered by patient taken the night of the study : N/A  Current Outpatient Medications:  .  amLODipine (NORVASC) 10 MG tablet, Take 10 mg by mouth daily.  , Disp: , Rfl:  .  aspirin 81 MG tablet, Take 81 mg by mouth daily.  , Disp: , Rfl:  .  atorvastatin (LIPITOR) 40 MG tablet, Take 40 mg by mouth daily., Disp: , Rfl:  .  Cholecalciferol (VITAMIN D3) 2000 UNITS TABS, Take 1 tablet by mouth daily., Disp: , Rfl:  .  hydrochlorothiazide (HYDRODIURIL) 25 MG tablet, Take 25 mg by mouth daily.  , Disp: , Rfl:  .  lisinopril (PRINIVIL,ZESTRIL) 40 MG tablet, Take 40 mg by mouth daily.  , Disp: , Rfl:  .  metFORMIN (GLUCOPHAGE) 500 MG tablet, Take 500 mg by mouth daily with breakfast. , Disp: , Rfl:     SLEEP ARCHITECTURE The total recording time is 441 minutes.  Sleep onset time was 22.7 minutes and the sleep efficiency was 58 %. The total sleep time was 257 minutes.  Stage REM  latency was 373 minutes.  The patient spent 12.1 % of the night in stage N1 sleep, 75.7 % in stage N2 sleep, 9.9 % in stage N3 and 2.3 % in REM.  Alpha intrusion was absent.    RESPIRATORY PARAMETERS The overall apnea/hypopnea index (AHI) was 43.7 per hour.  The AHI during Stage REM sleep was 30 per hour.  The minimum SpO2 during sleep was 74 %.  moderate snoring was noted during this study.  CARDIAC DATA The 2 lead EKG demonstrated sinus rhythm.   LEG MOVEMENT DATA The total PLMS were 0 with a resulting PLMS index of 0.   IMPRESSIONS -  This recording shows severe obstructive sleep apnea syndrome. AutoPAP 8-14 is recommended.   Argie Ramming, MD Diplomate, American Board of Sleep Medicine.  ELECTRONICALLY SIGNED ON:  06/17/2020, 10:26 AM [facility] PH: [phone]   FX: [phone] ACCREDITED BY THE AMERICAN ACADEMY OF SLEEP MEDICINE

## 2020-07-01 NOTE — H&P (Signed)
Surgical History & Physical  Patient Name: [name] [name] DOB: 01-Dec-1950  Surgery: Cataract extraction with intraocular lens implant phacoemulsification; Right Eye  Surgeon: Fabio Pierce MD Surgery Date:  07/19/2020 Pre-Op Date:  07/01/2020  HPI: A 70 Yr. old female patient Pt referred by Dr. Daphine Deutscher for cataract evaluation. The patient complains of difficulty when viewing TV, reading closed caption, news scrolls on TV, which began many years ago. Both eyes are affected. The episode is constant. The condition's severity is worsening. Pt wearing otc readers in multiple rx. The complaint is associated with glare. This is negatively affecting pt's quality of life. Pt c/o grittiness. Has h/o dry eye. Pt does not use any eye drops or tx HPI was performed by Fabio Pierce .  Medical History: Dry Eyes Cataracts Acid Reflux Hypercholesterolemia Arthritis Diabetes - DM Type 2 High Blood Pressure  Review of Systems Negative Allergic/Immunologic Negative Cardiovascular Negative Constitutional Negative Ear, Nose, Mouth & Throat Negative Endocrine Negative Eyes Negative Gastrointestinal Negative Genitourinary Negative Hemotologic/Lymphatic Negative Integumentary Negative Musculoskeletal Negative Neurological Negative Psychiatry Negative Respiratory  Social   Never smoked   Medication Metformin, GABAPENTIN, Lisinopril, Atorvastatin, Prednisone, Amlodipine,   Sx/Procedures  None  Drug Allergies   NKDA  History & Physical: Heent:  Cataract, Right eye NECK: supple without bruits LUNGS: lungs clear to auscultation CV: regular rate and rhythm Abdomen: soft and non-tender  Impression & Plan: Assessment: 1.  NUCLEAR SCLEROSIS AGE RELATED; Both Eyes (H25.13) 2.  Diabetes Type 2 No retinopathy (E11.9) 3.  Pinguecula; Both Eyes (H11.153)  Plan: 1.  Cataract accounts for the patient's decreased vision. This visual impairment is not correctable with a tolerable change in glasses  or contact lenses. Cataract surgery with an implantation of a new lens should significantly improve the visual and functional status of the patient. Discussed all risks, benefits, alternatives, and potential complications. Discussed the procedures and recovery. Patient desires to have surgery. A-scan ordered and performed today for intra-ocular lens calculations. The surgery will be performed in order to improve vision for driving, reading, and for eye examinations. Recommend phacoemulsification with intra-ocular lens. Recommend Dextenza for post-operative pain and inflammation. Right Eye worse - first. Dilates well - shugarcaine by protocol. 2.  Stressed importance of blood sugar and blood pressure control, and also yearly eye examinations. Discussed the need for ongoing proactive ocular exams and treatment, hopefully before visual symptoms develop. 3.  Observe; Artificial tears as needed for irritation.

## 2020-07-14 NOTE — Patient Instructions (Signed)
[name] [name]  07/14/2020     @PREFPERIOPPHARMACY @   Your procedure is scheduled on  Monday, July 19 2020.   Report to [facility]- Main Entrance and check in in day surgery  at  0730  A.M.   Call this number if you have problems the morning of surgery:  [phone]   Remember:  Do not eat or drink after midnight.                      Take these medicines the morning of surgery with A SIP OF WATER              Amlodipine, gabapentin. DO NOT take any medications for diabetes the morning of your procedure. If your glucose is less than 70/ drink 1/2 cup of clear juice(apple or white grape). Recheck your blood sugar in 15 minutes and if it is not 70, call [phone] for instructions.     Do not wear jewelry, make-up or nail polish.  Do not wear lotions, powders, or perfumes, or deodorant.  Do not shave 48 hours prior to surgery.  Men may shave face and neck.  Do not bring valuables to the hospital.  [facility] is not responsible for any belongings or valuables.             Please brush your teeth.   Contacts, dentures or bridgework may not be worn into surgery.  Leave your suitcase in the car.  After surgery it may be brought to your room.  For patients admitted to the hospital, discharge time will be determined by your treatment team.  Patients discharged the day of surgery will not be allowed to drive home and they will need someone with them for 14 hours.   Special instructions:   DO NOT smoke (tobacco or vaping), the morning of your procedure.  Please read over the following fact sheets that you were given. Anesthesia Post-op Instructions and Care and Recovery After Surgery      Cataract Surgery, Care After This sheet gives you information about how to care for yourself after your procedure. Your health care provider may also give you more specific instructions. If you have problems or questions, contact your health care provider. What can I expect after  the procedure? After the procedure, it is common to have:  Itching.  Discomfort.  Fluid discharge.  Sensitivity to light and to touch.  Bruising in or around the eye.  Mild blurred vision. Follow these instructions at home: Eye care  Do not touch or rub your eyes.  Protect your eyes as told by your health care provider. You may be told to wear a protective eye shield or sunglasses.  Do not put a contact lens into the affected eye or eyes until your health care provider approves.  Keep the area around your eye clean and dry: ? Avoid swimming. ? Do not allow water to hit you directly in the face while showering. ? Keep soap and shampoo out of your eyes.  Check your eye every day for signs of infection. Watch for: ? Redness, swelling, or pain. ? Fluid, blood, or pus. ? Warmth. ? A bad smell. ? Vision that is getting worse. ? Sensitivity that is getting worse.   Activity  Do not drive for 24 hours if you were given a sedative during your procedure.  Avoid strenuous activities, such as playing contact sports, for as long as told by your health  care provider.  Do not drive or use heavy machinery until your health care provider approves.  Do not bend or lift heavy objects. Bending increases pressure in the eye. You can walk, climb stairs, and do light household chores.  Ask your health care provider when you can return to work. If you work in a dusty environment, you may be advised to wear protective eyewear for a period of time. General instructions  Take or apply over-the-counter and prescription medicines only as told by your health care provider. This includes eye drops.  Keep all follow-up visits as told by your health care provider. This is important. Contact a health care provider if:  You have increased bruising around your eye.  You have pain that is not helped with medicine.  You have a fever.  You have redness, swelling, or pain in your eye.  You have  fluid, blood, or pus coming from your incision.  Your vision gets worse.  Your sensitivity to light gets worse. Get help right away if:  You have sudden loss of vision.  You see flashes of light or spots (floaters).  You have severe eye pain.  You develop nausea or vomiting. Summary  After your procedure, it is common to have itching, discomfort, bruising, fluid discharge, or sensitivity to light.  Follow instructions from your health care provider about caring for your eye after the procedure.  Do not rub your eye after the procedure. You may need to wear eye protection or sunglasses. Do not wear contact lenses. Keep the area around your eye clean and dry.  Avoid activities that require a lot of effort. These include playing sports and lifting heavy objects.  Contact a health care provider if you have increased bruising, pain that does not go away, or a fever. Get help right away if you suddenly lose your vision, see flashes of light or spots, or have severe pain in the eye. This information is not intended to replace advice given to you by your health care provider. Make sure you discuss any questions you have with your health care provider. Document Revised: 04/08/2019 Document Reviewed: 12/10/2017 Elsevier Patient Education  2021 [employer].

## 2020-07-15 ENCOUNTER — Encounter (HOSPITAL_COMMUNITY): Payer: Self-pay

## 2020-07-15 ENCOUNTER — Encounter (HOSPITAL_COMMUNITY)
Admission: RE | Admit: 2020-07-15 | Discharge: 2020-07-15 | Disposition: A | Discharge status: Home / Self Care | Payer: Medicare HMO | Source: Ambulatory Visit | Attending: Ophthalmology | Admitting: Ophthalmology

## 2020-07-15 ENCOUNTER — Other Ambulatory Visit: Payer: Self-pay

## 2020-07-15 ENCOUNTER — Other Ambulatory Visit (HOSPITAL_COMMUNITY)
Admission: RE | Admit: 2020-07-15 | Discharge: 2020-07-15 | Disposition: A | Discharge status: Home / Self Care | Payer: Medicare HMO | Source: Ambulatory Visit | Attending: Ophthalmology | Admitting: Ophthalmology

## 2020-07-15 DIAGNOSIS — Z01818 Encounter for other preprocedural examination: Secondary | ICD-10-CM | POA: Diagnosis not present

## 2020-07-15 DIAGNOSIS — Z20822 Contact with and (suspected) exposure to covid-19: Secondary | ICD-10-CM | POA: Insufficient documentation

## 2020-07-15 HISTORY — DX: Sleep apnea, unspecified: G47.30

## 2020-07-15 LAB — CBC WITH DIFFERENTIAL/PLATELET
Abs Immature Granulocytes: 0.02 10*3/uL (ref 0.00–0.07)
Basophils Absolute: 0.1 10*3/uL (ref 0.0–0.1)
Basophils Relative: 1 %
Eosinophils Absolute: 0.3 10*3/uL (ref 0.0–0.5)
Eosinophils Relative: 6 %
HCT: 41.7 % (ref 36.0–46.0)
Hemoglobin: 13.3 g/dL (ref 12.0–15.0)
Immature Granulocytes: 0 %
Lymphocytes Relative: 47 %
Lymphs Abs: 2.7 10*3/uL (ref 0.7–4.0)
MCH: 30.9 pg (ref 26.0–34.0)
MCHC: 31.9 g/dL (ref 30.0–36.0)
MCV: 97 fL (ref 80.0–100.0)
Monocytes Absolute: 0.6 10*3/uL (ref 0.1–1.0)
Monocytes Relative: 10 %
Neutro Abs: 2.1 10*3/uL (ref 1.7–7.7)
Neutrophils Relative %: 36 %
Platelets: 238 10*3/uL (ref 150–400)
RBC: 4.3 MIL/uL (ref 3.87–5.11)
RDW: 13.2 % (ref 11.5–15.5)
WBC: 5.8 10*3/uL (ref 4.0–10.5)
nRBC: 0 % (ref 0.0–0.2)

## 2020-07-15 LAB — BASIC METABOLIC PANEL
Anion gap: 9 (ref 5–15)
BUN: 15 mg/dL (ref 8–23)
CO2: 28 mmol/L (ref 22–32)
Calcium: 9.9 mg/dL (ref 8.9–10.3)
Chloride: 104 mmol/L (ref 98–111)
Creatinine, Ser: 1.01 mg/dL — ABNORMAL HIGH (ref 0.44–1.00)
GFR, Estimated: 60 mL/min — ABNORMAL LOW (ref >60)
Glucose, Bld: 78 mg/dL (ref 70–99)
Potassium: 3.7 mmol/L (ref 3.5–5.1)
Sodium: 141 mmol/L (ref 135–145)

## 2020-07-15 LAB — SARS CORONAVIRUS 2 (TAT 6-24 HRS): SARS Coronavirus 2: NEGATIVE

## 2020-07-15 LAB — HEMOGLOBIN A1C
Hgb A1c MFr Bld: 5.8 % — ABNORMAL HIGH (ref 4.8–5.6)
Mean Plasma Glucose: 119.76 mg/dL

## 2020-07-16 DIAGNOSIS — H2511 Age-related nuclear cataract, right eye: Secondary | ICD-10-CM | POA: Diagnosis not present

## 2020-07-19 ENCOUNTER — Encounter (HOSPITAL_COMMUNITY)
Admission: RE | Disposition: A | Discharge status: Home / Self Care | Payer: Self-pay | Source: Home / Self Care | Attending: Ophthalmology

## 2020-07-19 ENCOUNTER — Ambulatory Visit (HOSPITAL_COMMUNITY)
Admission: RE | Admit: 2020-07-19 | Discharge: 2020-07-19 | Disposition: A | Discharge status: Home / Self Care | Payer: Medicare HMO | Attending: Ophthalmology | Admitting: Ophthalmology

## 2020-07-19 ENCOUNTER — Ambulatory Visit (HOSPITAL_COMMUNITY): Payer: Medicare HMO | Admitting: Certified Registered"

## 2020-07-19 ENCOUNTER — Encounter (HOSPITAL_COMMUNITY): Payer: Self-pay | Admitting: Ophthalmology

## 2020-07-19 ENCOUNTER — Other Ambulatory Visit: Payer: Self-pay

## 2020-07-19 DIAGNOSIS — Z79899 Other long term (current) drug therapy: Secondary | ICD-10-CM | POA: Diagnosis not present

## 2020-07-19 DIAGNOSIS — I10 Essential (primary) hypertension: Secondary | ICD-10-CM | POA: Diagnosis not present

## 2020-07-19 DIAGNOSIS — Z7984 Long term (current) use of oral hypoglycemic drugs: Secondary | ICD-10-CM | POA: Diagnosis not present

## 2020-07-19 DIAGNOSIS — H25811 Combined forms of age-related cataract, right eye: Secondary | ICD-10-CM | POA: Diagnosis not present

## 2020-07-19 DIAGNOSIS — I2510 Atherosclerotic heart disease of native coronary artery without angina pectoris: Secondary | ICD-10-CM | POA: Diagnosis not present

## 2020-07-19 DIAGNOSIS — E1136 Type 2 diabetes mellitus with diabetic cataract: Secondary | ICD-10-CM | POA: Diagnosis not present

## 2020-07-19 DIAGNOSIS — H2511 Age-related nuclear cataract, right eye: Secondary | ICD-10-CM | POA: Diagnosis not present

## 2020-07-19 HISTORY — PX: CATARACT EXTRACTION W/PHACO: SHX586

## 2020-07-19 LAB — GLUCOSE, CAPILLARY: Glucose-Capillary: 99 mg/dL (ref 70–99)

## 2020-07-19 SURGERY — PHACOEMULSIFICATION, CATARACT, WITH IOL INSERTION
Anesthesia: Monitor Anesthesia Care | Site: Eye | Laterality: Right

## 2020-07-19 MED ORDER — LIDOCAINE HCL (PF) 1 % IJ SOLN
INTRAOCULAR | Status: DC | PRN
  Administered 2020-07-19: 1 mL via OPHTHALMIC

## 2020-07-19 MED ORDER — EPINEPHRINE PF 1 MG/ML IJ SOLN
INTRAOCULAR | Status: DC | PRN
  Administered 2020-07-19: 500 mL

## 2020-07-19 MED ORDER — MIDAZOLAM HCL 2 MG/2ML IJ SOLN
INTRAMUSCULAR | Status: DC | PRN
  Administered 2020-07-19: 1 mg via INTRAVENOUS

## 2020-07-19 MED ORDER — EPINEPHRINE PF 1 MG/ML IJ SOLN
INTRAMUSCULAR | Status: AC
  Filled 2020-07-19: qty 2

## 2020-07-19 MED ORDER — MIDAZOLAM HCL 2 MG/2ML IJ SOLN
INTRAMUSCULAR | Status: AC
  Filled 2020-07-19: qty 2

## 2020-07-19 MED ORDER — BSS IO SOLN
INTRAOCULAR | Status: DC | PRN
  Administered 2020-07-19: 15 mL

## 2020-07-19 MED ORDER — NEOMYCIN-POLYMYXIN-DEXAMETH 3.5-10000-0.1 OP SUSP
OPHTHALMIC | Status: DC | PRN
  Administered 2020-07-19: 1 [drp] via OPHTHALMIC

## 2020-07-19 MED ORDER — LIDOCAINE HCL 3.5 % OP GEL
1.0000 "application " | Freq: Once | OPHTHALMIC | Status: AC
  Administered 2020-07-19: 1 via OPHTHALMIC

## 2020-07-19 MED ORDER — POVIDONE-IODINE 5 % OP SOLN
OPHTHALMIC | Status: DC | PRN
  Administered 2020-07-19: 1 via OPHTHALMIC

## 2020-07-19 MED ORDER — STERILE WATER FOR IRRIGATION IR SOLN
Status: DC | PRN
  Administered 2020-07-19: 250 mL

## 2020-07-19 MED ORDER — PROVISC 10 MG/ML IO SOLN
INTRAOCULAR | Status: DC | PRN
  Administered 2020-07-19: 0.85 mL via INTRAOCULAR

## 2020-07-19 MED ORDER — SODIUM HYALURONATE 23 MG/ML IO SOLN
INTRAOCULAR | Status: DC | PRN
  Administered 2020-07-19: 0.6 mL via INTRAOCULAR

## 2020-07-19 MED ORDER — PHENYLEPHRINE HCL 2.5 % OP SOLN
1.0000 [drp] | OPHTHALMIC | Status: AC | PRN
  Administered 2020-07-19 (×3): 1 [drp] via OPHTHALMIC

## 2020-07-19 MED ORDER — SODIUM CHLORIDE 0.9% FLUSH
INTRAVENOUS | Status: DC | PRN
  Administered 2020-07-19: 5 mL via INTRAVENOUS

## 2020-07-19 MED ORDER — TROPICAMIDE 1 % OP SOLN
1.0000 [drp] | OPHTHALMIC | Status: AC
  Administered 2020-07-19 (×3): 1 [drp] via OPHTHALMIC

## 2020-07-19 MED ORDER — TETRACAINE HCL 0.5 % OP SOLN
1.0000 [drp] | OPHTHALMIC | Status: AC | PRN
  Administered 2020-07-19 (×3): 1 [drp] via OPHTHALMIC

## 2020-07-19 SURGICAL SUPPLY — 15 items
CLOTH BEACON ORANGE TIMEOUT ST (SAFETY) ×1 IMPLANT
EYE SHIELD UNIVERSAL CLEAR (GAUZE/BANDAGES/DRESSINGS) ×1 IMPLANT
GLOVE BIOGEL PI IND STRL 6.5 (GLOVE) IMPLANT
GLOVE BIOGEL PI IND STRL 7.0 (GLOVE) IMPLANT
GLOVE BIOGEL PI INDICATOR 6.5 (GLOVE) ×1
GLOVE BIOGEL PI INDICATOR 7.0 (GLOVE) ×2
NDL HYPO 18GX1.5 BLUNT FILL (NEEDLE) IMPLANT
NEEDLE HYPO 18GX1.5 BLUNT FILL (NEEDLE) ×2 IMPLANT
PAD ARMBOARD 7.5X6 YLW CONV (MISCELLANEOUS) ×1 IMPLANT
SYR TB 1ML LL NO SAFETY (SYRINGE) ×1 IMPLANT
TAPE SURG TRANSPORE 1 IN (GAUZE/BANDAGES/DRESSINGS) IMPLANT
TAPE SURGICAL TRANSPORE 1 IN (GAUZE/BANDAGES/DRESSINGS) ×2
Tecnis 1 piece IOL (Intraocular Lens) ×1 IMPLANT
VISCOELASTIC ADDITIONAL (OPHTHALMIC RELATED) IMPLANT
WATER STERILE IRR 250ML POUR (IV SOLUTION) ×1 IMPLANT

## 2020-07-19 NOTE — Interval H&P Note (Signed)
History and Physical Interval Note:  07/19/2020 9:24 AM  [name] [name]  has presented today for surgery, with the diagnosis of Nuclear sclerotic cataract - Right eye.  The various methods of treatment have been discussed with the patient and family. After consideration of risks, benefits and other options for treatment, the patient has consented to  Procedure(s) with comments: CATARACT EXTRACTION PHACO AND INTRAOCULAR LENS PLACEMENT RIGHT EYE (Right) - right as a surgical intervention.  The patient's history has been reviewed, patient examined, no change in status, stable for surgery.  I have reviewed the patient's chart and labs.  Questions were answered to the patient's satisfaction.     Fabio Pierce

## 2020-07-19 NOTE — Transfer of Care (Signed)
Immediate Anesthesia Transfer of Care Note  Patient: [name] [name]  Procedure(s) Performed: CATARACT EXTRACTION PHACO AND INTRAOCULAR LENS PLACEMENT RIGHT EYE (Right Eye)  Patient Location: Short Stay  Anesthesia Type:MAC  Level of Consciousness: awake, alert  and oriented  Airway & Oxygen Therapy: Patient Spontanous Breathing  Post-op Assessment: Report given to RN and Post -op Vital signs reviewed and stable  Post vital signs: Reviewed and stable  Last Vitals:  Vitals Value Taken Time  BP    Temp    Pulse    Resp    SpO2      Last Pain:  Vitals:   07/19/20 0843  TempSrc: Oral  PainSc: 0-No pain      Patients Stated Pain Goal: 4 (15/83/09 4076)  Complications: No complications documented.

## 2020-07-19 NOTE — Op Note (Signed)
Date of procedure: 07/19/20  Pre-operative diagnosis: Visually significant age-related nuclear cataract, Right Eye (H25.11)  Post-operative diagnosis: Visually significant age-related nuclear cataract, Right Eye  Procedure: Removal of cataract via phacoemulsification and insertion of intra-ocular lens [name] [name] and [name]  +22.0D into the capsular bag of the Right Eye  Attending surgeon: Gerda Diss. Jkai Arwood, MD, MA  Anesthesia: MAC, Topical Akten  Complications: None  Estimated Blood Loss: <62m (minimal)  Specimens: None  Implants: As above  Indications:  Visually significant age-related cataract, Right Eye  Procedure:  The patient was seen and identified in the pre-operative area. The operative eye was identified and dilated.  The operative eye was marked.  Topical anesthesia was administered to the operative eye.     The patient was then to the operative suite and placed in the supine position.  A timeout was performed confirming the patient, procedure to be performed, and all other relevant information.   The patient's face was prepped and draped in the usual fashion for intra-ocular surgery.  A lid speculum was placed into the operative eye and the surgical microscope moved into place and focused.  A superotemporal paracentesis was created using a 20 gauge paracentesis blade.  Shugarcaine was injected into the anterior chamber.  Viscoelastic was injected into the anterior chamber.  A temporal clear-corneal main wound incision was created using a 2.474mmicrokeratome.  A continuous curvilinear capsulorrhexis was initiated using an irrigating cystitome and completed using capsulorrhexis forceps.  Hydrodissection and hydrodeliniation were performed.  Viscoelastic was injected into the anterior chamber.  A phacoemulsification handpiece and a chopper as a second instrument were used to remove the nucleus and epinucleus. The irrigation/aspiration handpiece was used to remove any  remaining cortical material.   The capsular bag was reinflated with viscoelastic, checked, and found to be intact.  The intraocular lens was inserted into the capsular bag.  The irrigation/aspiration handpiece was used to remove any remaining viscoelastic.  The clear corneal wound and paracentesis wounds were then hydrated and checked with Weck-Cels to be watertight.  The lid-speculum and drape was removed, and the patient's face was cleaned with a wet and dry 4x4.  Maxitrol was instilled in the eye before a clear shield was taped over the eye. The patient was taken to the post-operative care unit in good condition, having tolerated the procedure well.  Post-Op Instructions: [facility] and will receive all other orders and instructions.

## 2020-07-19 NOTE — Discharge Instructions (Addendum)
Please discharge patient when stable, will follow up today with Dr. Wrzosek at the [facility] office immediately following discharge.  Leave shield in place until visit.  All paperwork with discharge instructions will be given at the office.  [facility] Address:  [address]  [place], [place] [address]             Monitored Anesthesia Care, Care After This sheet gives you information about how to care for yourself after your procedure. Your health care provider may also give you more specific instructions. If you have problems or questions, contact your health care provider. What can I expect after the procedure? After the procedure, it is common to have:  Tiredness.  Forgetfulness about what happened after the procedure.  Impaired judgment for important decisions.  Nausea or vomiting.  Some difficulty with balance. Follow these instructions at home: For the time period you were told by your health care provider:  Rest as needed.  Do not participate in activities where you could fall or become injured.  Do not drive or use machinery.  Do not drink alcohol.  Do not take sleeping pills or medicines that cause drowsiness.  Do not make important decisions or sign legal documents.  Do not take care of children on your own.      Eating and drinking  Follow the diet that is recommended by your health care provider.  Drink enough fluid to keep your urine pale yellow.  If you vomit: ? Drink water, juice, or soup when you can drink without vomiting. ? Make sure you have little or no nausea before eating solid foods. General instructions  Have a responsible adult stay with you for the time you are told. It is important to have someone help care for you until you are awake and alert.  Take over-the-counter and prescription medicines only as told by your health care provider.  If you have sleep apnea, surgery and certain  medicines can increase your risk for breathing problems. Follow instructions from your health care provider about wearing your sleep device: ? Anytime you are sleeping, including during daytime naps. ? While taking prescription pain medicines, sleeping medicines, or medicines that make you drowsy.  Avoid smoking.  Keep all follow-up visits as told by your health care provider. This is important. Contact a health care provider if:  You keep feeling nauseous or you keep vomiting.  You feel light-headed.  You are still sleepy or having trouble with balance after 24 hours.  You develop a rash.  You have a fever.  You have redness or swelling around the IV site. Get help right away if:  You have trouble breathing.  You have new-onset confusion at home. Summary  For several hours after your procedure, you may feel tired. You may also be forgetful and have poor judgment.  Have a responsible adult stay with you for the time you are told. It is important to have someone help care for you until you are awake and alert.  Rest as told. Do not drive or operate machinery. Do not drink alcohol or take sleeping pills.  Get help right away if you have trouble breathing, or if you suddenly become confused. This information is not intended to replace advice given to you by your health care provider. Make sure you discuss any questions you have with your health care provider. Document Revised: 02/26/2020 Document Reviewed: 05/15/2019 Elsevier Patient Education  2021 [employer]  

## 2020-07-19 NOTE — Anesthesia Preprocedure Evaluation (Signed)
Anesthesia Evaluation  Patient identified by MRN, date of birth, ID band Patient awake    Reviewed: Allergy & Precautions, H&P , NPO status , Patient's Chart, lab work & pertinent test results, reviewed documented beta blocker date and time   Airway Mallampati: II  TM Distance: >3 FB Neck ROM: full    Dental no notable dental hx.    Pulmonary shortness of breath, sleep apnea ,    Pulmonary exam normal breath sounds clear to auscultation       Cardiovascular Exercise Tolerance: Good hypertension, + CAD and + Past MI   Rhythm:regular Rate:Normal     Neuro/Psych PSYCHIATRIC DISORDERS Depression  Neuromuscular disease    GI/Hepatic Neg liver ROS, GERD  Medicated,  Endo/Other  diabetesMorbid obesity  Renal/GU CRFRenal disease  negative genitourinary   Musculoskeletal   Abdominal   Peds  Hematology negative hematology ROS (+)   Anesthesia Other Findings   Reproductive/Obstetrics negative OB ROS                             Anesthesia Physical Anesthesia Plan  ASA: III  Anesthesia Plan: MAC   Post-op Pain Management:    Induction:   PONV Risk Score and Plan:   Airway Management Planned:   Additional Equipment:   Intra-op Plan:   Post-operative Plan:   Informed Consent: I have reviewed the patients History and Physical, chart, labs and discussed the procedure including the risks, benefits and alternatives for the proposed anesthesia with the patient or authorized representative who has indicated his/her understanding and acceptance.     Dental Advisory Given  Plan Discussed with: CRNA  Anesthesia Plan Comments:         Anesthesia Quick Evaluation

## 2020-07-19 NOTE — Anesthesia Procedure Notes (Signed)
Procedure Name: MAC Date/Time: 07/19/2020 9:31 AM Performed by: Orlie Dakin, CRNA Pre-anesthesia Checklist: Patient identified, Emergency Drugs available, Suction available and Patient being monitored Patient Re-evaluated:Patient Re-evaluated prior to induction Oxygen Delivery Method: Nasal cannula Placement Confirmation: positive ETCO2

## 2020-07-19 NOTE — Anesthesia Postprocedure Evaluation (Signed)
Anesthesia Post Note  Patient: [name] [name]  Procedure(s) Performed: CATARACT EXTRACTION PHACO AND INTRAOCULAR LENS PLACEMENT RIGHT EYE (Right Eye)  Patient location during evaluation: Phase II Anesthesia Type: MAC Level of consciousness: awake and alert and oriented Pain management: pain level controlled (Complains of chronic back pain.) Vital Signs Assessment: post-procedure vital signs reviewed and stable Respiratory status: spontaneous breathing, nonlabored ventilation and respiratory function stable Cardiovascular status: blood pressure returned to baseline and stable Postop Assessment: no apparent nausea or vomiting Anesthetic complications: no   No complications documented.   Last Vitals:  Vitals:   07/19/20 0843  BP: 137/78  Pulse: 65  Resp: 19  Temp: 36.9 C  SpO2: 100%    Last Pain:  Vitals:   07/19/20 0843  TempSrc: Oral  PainSc: 0-No pain                 Orlie Dakin

## 2020-07-20 ENCOUNTER — Encounter (HOSPITAL_COMMUNITY): Payer: Self-pay | Admitting: Ophthalmology

## 2020-07-29 DIAGNOSIS — E7849 Other hyperlipidemia: Secondary | ICD-10-CM | POA: Diagnosis not present

## 2020-07-29 DIAGNOSIS — N183 Chronic kidney disease, stage 3 unspecified: Secondary | ICD-10-CM | POA: Diagnosis not present

## 2020-07-29 DIAGNOSIS — E782 Mixed hyperlipidemia: Secondary | ICD-10-CM | POA: Diagnosis not present

## 2020-07-29 DIAGNOSIS — I10 Essential (primary) hypertension: Secondary | ICD-10-CM | POA: Diagnosis not present

## 2020-07-29 DIAGNOSIS — R7301 Impaired fasting glucose: Secondary | ICD-10-CM | POA: Diagnosis not present

## 2020-08-02 DIAGNOSIS — H25812 Combined forms of age-related cataract, left eye: Secondary | ICD-10-CM | POA: Diagnosis not present

## 2020-08-03 DIAGNOSIS — Z6838 Body mass index (BMI) 38.0-38.9, adult: Secondary | ICD-10-CM | POA: Diagnosis not present

## 2020-08-03 DIAGNOSIS — M1711 Unilateral primary osteoarthritis, right knee: Secondary | ICD-10-CM | POA: Diagnosis not present

## 2020-08-03 DIAGNOSIS — I10 Essential (primary) hypertension: Secondary | ICD-10-CM | POA: Diagnosis not present

## 2020-08-03 DIAGNOSIS — G4733 Obstructive sleep apnea (adult) (pediatric): Secondary | ICD-10-CM | POA: Diagnosis not present

## 2020-08-03 DIAGNOSIS — M2550 Pain in unspecified joint: Secondary | ICD-10-CM | POA: Diagnosis not present

## 2020-08-03 DIAGNOSIS — N182 Chronic kidney disease, stage 2 (mild): Secondary | ICD-10-CM | POA: Diagnosis not present

## 2020-08-03 DIAGNOSIS — E7849 Other hyperlipidemia: Secondary | ICD-10-CM | POA: Diagnosis not present

## 2020-08-03 DIAGNOSIS — R7301 Impaired fasting glucose: Secondary | ICD-10-CM | POA: Diagnosis not present

## 2020-08-03 NOTE — H&P (Signed)
Surgical History & Physical  Patient Name: [name] [name] DOB: 1950-09-04  Surgery: Cataract extraction with intraocular lens implant phacoemulsification; Left Eye  Surgeon: Fabio Pierce MD Surgery Date:  08/09/2020 Pre-Op Date:  07/26/2020  HPI: A 61 Yr. old female patient 1. 1. The patient complains of difficulty when viewing TV, reading closed caption, news scrolls on TV, which began many years ago. The left eye is affected. The episode is gradual. The condition's severity increased since last visit. Symptoms occur when the patient is inside and outside. This is negatively affecting the patient's quality of life. 2. The patient is returning after cataract post-op. The right eye is affected. Status post cataract post-op, which began 1 week ago: Since the last visit, the affected area is doing well. The patient's vision is improved. Patient is following medication instructions. HPI Completed by Dr. Fabio Pierce  Medical History: Dry Eyes Cataracts Acid Reflux Hypercholesterolemia Arthritis Diabetes - DM Type 2 High Blood Pressure  Review of Systems Negative Allergic/Immunologic Negative Cardiovascular Negative Constitutional Negative Ear, Nose, Mouth & Throat Negative Endocrine Negative Eyes Negative Gastrointestinal Negative Genitourinary Negative Hemotologic/Lymphatic Negative Integumentary Negative Musculoskeletal Negative Neurological Negative Psychiatry Negative Respiratory  Social   Never smoked   Medication Prednisolone-gatiflox-bromfenac,  Metformin, GABAPENTIN, Lisinopril, Atorvastatin, Prednisone, Amlodipine,   Sx/Procedures Phaco c IOL OD,   Drug Allergies   NKDA  History & Physical: Heent:  Cataract, Left eye NECK: supple without bruits LUNGS: lungs clear to auscultation CV: regular rate and rhythm Abdomen: soft and non-tender  Impression & Plan: Assessment: 1.  CATARACT EXTRACTION STATUS; Right Eye (Z98.41) 2.  INTRAOCULAR LENS IOL  (Z96.1) 3.  NUCLEAR SCLEROSIS AGE RELATED; Left Eye (H25.12)  Plan: 1.  1 week after cataract surgery. Doing well with improved vision and normal eye pressure. Call with any problems or concerns. Continue Gati-Brom-Pred 2x/day for 3 more weeks. 2.  Doing well since surgery Continue Post-op medications 3.  Dilates well - shugarcaine by protocol. Cataract accounts for the patient's decreased vision. This visual impairment is not correctable with a tolerable change in glasses or contact lenses. Cataract surgery with an implantation of a new lens should significantly improve the visual and functional status of the patient. Discussed all risks, benefits, alternatives, and potential complications. Discussed the procedures and recovery. Patient desires to have surgery. A-scan ordered and performed today for intra-ocular lens calculations. The surgery will be performed in order to improve vision for driving, reading, and for eye examinations. Recommend phacoemulsification with intra-ocular lens. Recommend Dextenza for post-operative pain and inflammation. Left Eye. Surgery required to correct imbalance of vision.

## 2020-08-04 ENCOUNTER — Other Ambulatory Visit: Payer: Self-pay

## 2020-08-04 ENCOUNTER — Encounter (HOSPITAL_COMMUNITY)
Admission: RE | Admit: 2020-08-04 | Discharge: 2020-08-04 | Disposition: A | Discharge status: Home / Self Care | Payer: Medicare HMO | Source: Ambulatory Visit | Attending: Ophthalmology | Admitting: Ophthalmology

## 2020-08-04 ENCOUNTER — Encounter (HOSPITAL_COMMUNITY): Payer: Self-pay

## 2020-08-06 ENCOUNTER — Other Ambulatory Visit: Payer: Self-pay

## 2020-08-06 ENCOUNTER — Other Ambulatory Visit (HOSPITAL_COMMUNITY)
Admission: RE | Admit: 2020-08-06 | Discharge: 2020-08-06 | Disposition: A | Discharge status: Home / Self Care | Payer: Medicare HMO | Source: Ambulatory Visit | Attending: Ophthalmology | Admitting: Ophthalmology

## 2020-08-06 DIAGNOSIS — Z20822 Contact with and (suspected) exposure to covid-19: Secondary | ICD-10-CM | POA: Insufficient documentation

## 2020-08-06 DIAGNOSIS — Z01812 Encounter for preprocedural laboratory examination: Secondary | ICD-10-CM | POA: Diagnosis not present

## 2020-08-06 LAB — SARS CORONAVIRUS 2 (TAT 6-24 HRS): SARS Coronavirus 2: NEGATIVE

## 2020-08-09 ENCOUNTER — Ambulatory Visit (HOSPITAL_COMMUNITY): Payer: Medicare HMO | Admitting: Anesthesiology

## 2020-08-09 ENCOUNTER — Ambulatory Visit (HOSPITAL_COMMUNITY)
Admission: RE | Admit: 2020-08-09 | Discharge: 2020-08-09 | Disposition: A | Discharge status: Home / Self Care | Payer: Medicare HMO | Attending: Ophthalmology | Admitting: Ophthalmology

## 2020-08-09 ENCOUNTER — Encounter (HOSPITAL_COMMUNITY): Payer: Self-pay | Admitting: Ophthalmology

## 2020-08-09 ENCOUNTER — Encounter (HOSPITAL_COMMUNITY)
Admission: RE | Disposition: A | Discharge status: Home / Self Care | Payer: Self-pay | Source: Home / Self Care | Attending: Ophthalmology

## 2020-08-09 ENCOUNTER — Other Ambulatory Visit: Payer: Self-pay

## 2020-08-09 DIAGNOSIS — Z79899 Other long term (current) drug therapy: Secondary | ICD-10-CM | POA: Insufficient documentation

## 2020-08-09 DIAGNOSIS — E1136 Type 2 diabetes mellitus with diabetic cataract: Secondary | ICD-10-CM | POA: Insufficient documentation

## 2020-08-09 DIAGNOSIS — Z961 Presence of intraocular lens: Secondary | ICD-10-CM | POA: Diagnosis not present

## 2020-08-09 DIAGNOSIS — Z9841 Cataract extraction status, right eye: Secondary | ICD-10-CM | POA: Diagnosis not present

## 2020-08-09 DIAGNOSIS — H25812 Combined forms of age-related cataract, left eye: Secondary | ICD-10-CM | POA: Insufficient documentation

## 2020-08-09 DIAGNOSIS — I2510 Atherosclerotic heart disease of native coronary artery without angina pectoris: Secondary | ICD-10-CM | POA: Diagnosis not present

## 2020-08-09 DIAGNOSIS — Z7952 Long term (current) use of systemic steroids: Secondary | ICD-10-CM | POA: Insufficient documentation

## 2020-08-09 DIAGNOSIS — Z7984 Long term (current) use of oral hypoglycemic drugs: Secondary | ICD-10-CM | POA: Diagnosis not present

## 2020-08-09 HISTORY — PX: CATARACT EXTRACTION W/PHACO: SHX586

## 2020-08-09 LAB — GLUCOSE, CAPILLARY: Glucose-Capillary: 103 mg/dL — ABNORMAL HIGH (ref 70–99)

## 2020-08-09 SURGERY — PHACOEMULSIFICATION, CATARACT, WITH IOL INSERTION
Anesthesia: Monitor Anesthesia Care | Site: Eye | Laterality: Left

## 2020-08-09 MED ORDER — SODIUM HYALURONATE 23 MG/ML IO SOLN
INTRAOCULAR | Status: DC | PRN
  Administered 2020-08-09: 0.6 mL via INTRAOCULAR

## 2020-08-09 MED ORDER — NEOMYCIN-POLYMYXIN-DEXAMETH 3.5-10000-0.1 OP SUSP
OPHTHALMIC | Status: DC | PRN
  Administered 2020-08-09: 1 [drp] via OPHTHALMIC

## 2020-08-09 MED ORDER — MIDAZOLAM HCL 2 MG/2ML IJ SOLN
INTRAMUSCULAR | Status: AC
  Filled 2020-08-09: qty 2

## 2020-08-09 MED ORDER — PROVISC 10 MG/ML IO SOLN
INTRAOCULAR | Status: DC | PRN
  Administered 2020-08-09: 0.85 mL via INTRAOCULAR

## 2020-08-09 MED ORDER — MIDAZOLAM HCL 2 MG/2ML IJ SOLN
INTRAMUSCULAR | Status: DC | PRN
  Administered 2020-08-09: 1 mg via INTRAVENOUS

## 2020-08-09 MED ORDER — EPINEPHRINE PF 1 MG/ML IJ SOLN
INTRAMUSCULAR | Status: AC
  Filled 2020-08-09: qty 2

## 2020-08-09 MED ORDER — POVIDONE-IODINE 5 % OP SOLN
OPHTHALMIC | Status: DC | PRN
  Administered 2020-08-09: 1 via OPHTHALMIC

## 2020-08-09 MED ORDER — BSS IO SOLN
INTRAOCULAR | Status: DC | PRN
  Administered 2020-08-09: 15 mL via INTRAOCULAR

## 2020-08-09 MED ORDER — TETRACAINE HCL 0.5 % OP SOLN
1.0000 [drp] | OPHTHALMIC | Status: AC | PRN
  Administered 2020-08-09 (×3): 1 [drp] via OPHTHALMIC

## 2020-08-09 MED ORDER — LIDOCAINE HCL (PF) 1 % IJ SOLN
INTRAOCULAR | Status: DC | PRN
  Administered 2020-08-09: 1 mL via OPHTHALMIC

## 2020-08-09 MED ORDER — TROPICAMIDE 1 % OP SOLN
1.0000 [drp] | OPHTHALMIC | Status: AC
  Administered 2020-08-09 (×3): 1 [drp] via OPHTHALMIC

## 2020-08-09 MED ORDER — LIDOCAINE HCL 3.5 % OP GEL
1.0000 "application " | Freq: Once | OPHTHALMIC | Status: AC
  Administered 2020-08-09: 1 via OPHTHALMIC

## 2020-08-09 MED ORDER — EPINEPHRINE PF 1 MG/ML IJ SOLN
INTRAOCULAR | Status: DC | PRN
  Administered 2020-08-09: 500 mL

## 2020-08-09 MED ORDER — STERILE WATER FOR IRRIGATION IR SOLN
Status: DC | PRN
  Administered 2020-08-09: 250 mL

## 2020-08-09 MED ORDER — PHENYLEPHRINE HCL 2.5 % OP SOLN
1.0000 [drp] | OPHTHALMIC | Status: AC | PRN
  Administered 2020-08-09 (×3): 1 [drp] via OPHTHALMIC

## 2020-08-09 MED ORDER — SODIUM CHLORIDE 0.9% FLUSH
INTRAVENOUS | Status: DC | PRN
  Administered 2020-08-09: 5 mL via INTRAVENOUS

## 2020-08-09 SURGICAL SUPPLY — 14 items
CLOTH BEACON ORANGE TIMEOUT ST (SAFETY) ×1 IMPLANT
EYE SHIELD UNIVERSAL CLEAR (GAUZE/BANDAGES/DRESSINGS) ×1 IMPLANT
GLOVE SURG UNDER POLY LF SZ6.5 (GLOVE) ×1 IMPLANT
GLOVE SURG UNDER POLY LF SZ7 (GLOVE) ×2 IMPLANT
GOWN STRL REUS W/TWL LRG LVL3 (GOWN DISPOSABLE) ×1 IMPLANT
NDL HYPO 18GX1.5 BLUNT FILL (NEEDLE) IMPLANT
NEEDLE HYPO 18GX1.5 BLUNT FILL (NEEDLE) ×2 IMPLANT
PAD ARMBOARD 7.5X6 YLW CONV (MISCELLANEOUS) ×1 IMPLANT
SYR TB 1ML LL NO SAFETY (SYRINGE) ×1 IMPLANT
TAPE SURG TRANSPORE 1 IN (GAUZE/BANDAGES/DRESSINGS) IMPLANT
TAPE SURGICAL TRANSPORE 1 IN (GAUZE/BANDAGES/DRESSINGS) ×2
TECNIS IOL (Intraocular Lens) ×1 IMPLANT
VISCOELASTIC ADDITIONAL (OPHTHALMIC RELATED) IMPLANT
WATER STERILE IRR 250ML POUR (IV SOLUTION) ×1 IMPLANT

## 2020-08-09 NOTE — Interval H&P Note (Signed)
History and Physical Interval Note:  08/09/2020 11:03 AM  [name] [name]  has presented today for surgery, with the diagnosis of Nuclear sclerotic cataract - Left eye.  The various methods of treatment have been discussed with the patient and family. After consideration of risks, benefits and other options for treatment, the patient has consented to  Procedure(s) with comments: CATARACT EXTRACTION PHACO AND INTRAOCULAR LENS PLACEMENT (IOC) (Left) - left as a surgical intervention.  The patient's history has been reviewed, patient examined, no change in status, stable for surgery.  I have reviewed the patient's chart and labs.  Questions were answered to the patient's satisfaction.     Fabio Pierce

## 2020-08-09 NOTE — Op Note (Signed)
Date of procedure: 08/09/20  Pre-operative diagnosis: Visually significant age-related combined cataract, Left Eye (H25.812)  Post-operative diagnosis: Visually significant age-related combined cataract, Left Eye (H25.812)  Procedure: Removal of cataract via phacoemulsification and insertion of intra-ocular lens Johnson and Lago  +22.0D into the capsular bag of the Left Eye  Attending surgeon: Gerda Diss. Britiny Defrain, MD, MA  Anesthesia: MAC, Topical Akten  Complications: None  Estimated Blood Loss: <23m (minimal)  Specimens: None  Implants: As above  Indications:  Visually significant age-related cataract, Left Eye  Procedure:  The patient was seen and identified in the pre-operative area. The operative eye was identified and dilated.  The operative eye was marked.  Topical anesthesia was administered to the operative eye.     The patient was then to the operative suite and placed in the supine position.  A timeout was performed confirming the patient, procedure to be performed, and all other relevant information.   The patient's face was prepped and draped in the usual fashion for intra-ocular surgery.  A lid speculum was placed into the operative eye and the surgical microscope moved into place and focused.  An inferotemporal paracentesis was created using a 20 gauge paracentesis blade.  Shugarcaine was injected into the anterior chamber.  Viscoelastic was injected into the anterior chamber.  A temporal clear-corneal main wound incision was created using a 2.415mmicrokeratome.  A continuous curvilinear capsulorrhexis was initiated using an irrigating cystitome and completed using capsulorrhexis forceps.  Hydrodissection and hydrodeliniation were performed.  Viscoelastic was injected into the anterior chamber.  A phacoemulsification handpiece and a chopper as a second instrument were used to remove the nucleus and epinucleus. The irrigation/aspiration handpiece was used to remove  any remaining cortical material.   The capsular bag was reinflated with viscoelastic, checked, and found to be intact.  The intraocular lens was inserted into the capsular bag.  The irrigation/aspiration handpiece was used to remove any remaining viscoelastic.  The clear corneal wound and paracentesis wounds were then hydrated and checked with Weck-Cels to be watertight.  The lid-speculum was removed.  The drape was removed.  The patient's face was cleaned with a wet and dry 4x4.   Maxitrol was instilled in the eye. A clear shield was taped over the eye. The patient was taken to the post-operative care unit in good condition, having tolerated the procedure well.  Post-Op Instructions: [facility] and will receive all other orders and instructions.

## 2020-08-09 NOTE — Transfer of Care (Signed)
Immediate Anesthesia Transfer of Care Note  Patient: [name] [name]  Procedure(s) Performed: CATARACT EXTRACTION PHACO AND INTRAOCULAR LENS PLACEMENT (IOC) (Left Eye)  Patient Location: Short Stay  Anesthesia Type:MAC  Level of Consciousness: awake, alert  and oriented  Airway & Oxygen Therapy: Patient Spontanous Breathing  Post-op Assessment: Report given to RN and Post -op Vital signs reviewed and stable  Post vital signs: Reviewed and stable  Last Vitals:  Vitals Value Taken Time  BP    Temp    Pulse    Resp    SpO2      Last Pain:  Vitals:   08/09/20 1016  TempSrc: Oral      Patients Stated Pain Goal: 7 (04/59/97 7414)  Complications: No complications documented.

## 2020-08-09 NOTE — Anesthesia Postprocedure Evaluation (Signed)
Anesthesia Post Note  Patient: DINESHA TWIGGS  Procedure(s) Performed: CATARACT EXTRACTION PHACO AND INTRAOCULAR LENS PLACEMENT ([place]) (Left Eye)  Patient location during evaluation: Phase II Anesthesia Type: MAC Level of consciousness: awake and alert and oriented Pain management: pain level controlled Vital Signs Assessment: post-procedure vital signs reviewed and stable Respiratory status: spontaneous breathing, nonlabored ventilation and respiratory function stable Cardiovascular status: blood pressure returned to baseline and stable Postop Assessment: no apparent nausea or vomiting Anesthetic complications: no   No complications documented.   Last Vitals:  Vitals:   08/09/20 1016 08/09/20 1129  BP: (!) 143/111 132/70  Pulse:  68  Resp: 13 14  Temp: 37 C 37 C  SpO2: 97% 98%    Last Pain:  Vitals:   08/09/20 1129  TempSrc: Oral  PainSc: 0-No pain                 Orlie Dakin

## 2020-08-09 NOTE — Anesthesia Procedure Notes (Signed)
Date/Time: 08/09/2020 11:10 AM Performed by: Julian Reil, CRNA Pre-anesthesia Checklist: Patient identified, Emergency Drugs available, Suction available and Patient being monitored Oxygen Delivery Method: Nasal cannula Placement Confirmation: positive ETCO2

## 2020-08-09 NOTE — Discharge Instructions (Addendum)
Please discharge patient when stable, will follow up today with Dr. Wrzosek at the [facility] office immediately following discharge.  Leave shield in place until visit.  All paperwork with discharge instructions will be given at the office.  [facility] Address:  [address]  [place], [place] [address]  

## 2020-08-09 NOTE — Anesthesia Preprocedure Evaluation (Addendum)
Anesthesia Evaluation  Patient identified by MRN, date of birth, ID band Patient awake    Reviewed: Allergy & Precautions, NPO status , Patient's Chart, lab work & pertinent test results  History of Anesthesia Complications Negative for: history of anesthetic complications  Airway Mallampati: III  TM Distance: >3 FB Neck ROM: Full    Dental  (+) Dental Advisory Given, Upper Dentures, Lower Dentures   Pulmonary shortness of breath, sleep apnea ,    Pulmonary exam normal breath sounds clear to auscultation       Cardiovascular Exercise Tolerance: Good hypertension, Pt. on medications + CAD and + Past MI  Normal cardiovascular exam Rhythm:Regular Rate:Normal     Neuro/Psych PSYCHIATRIC DISORDERS Depression  Neuromuscular disease    GI/Hepatic GERD  Medicated and Controlled,  Endo/Other  diabetes, Well Controlled, Type 2, Oral Hypoglycemic Agents  Renal/GU Renal InsufficiencyRenal disease     Musculoskeletal  (+) Arthritis ,   Abdominal   Peds  Hematology   Anesthesia Other Findings   Reproductive/Obstetrics                            Anesthesia Physical Anesthesia Plan  ASA: III  Anesthesia Plan: MAC   Post-op Pain Management:    Induction:   PONV Risk Score and Plan:   Airway Management Planned: Nasal Cannula and Natural Airway  Additional Equipment:   Intra-op Plan:   Post-operative Plan:   Informed Consent: I have reviewed the patients History and Physical, chart, labs and discussed the procedure including the risks, benefits and alternatives for the proposed anesthesia with the patient or authorized representative who has indicated his/her understanding and acceptance.     Dental advisory given  Plan Discussed with: CRNA and Surgeon  Anesthesia Plan Comments:        Anesthesia Quick Evaluation

## 2020-08-10 ENCOUNTER — Encounter (HOSPITAL_COMMUNITY): Payer: Self-pay | Admitting: Ophthalmology

## 2020-08-23 DIAGNOSIS — N183 Chronic kidney disease, stage 3 unspecified: Secondary | ICD-10-CM | POA: Diagnosis not present

## 2020-08-23 DIAGNOSIS — I129 Hypertensive chronic kidney disease with stage 1 through stage 4 chronic kidney disease, or unspecified chronic kidney disease: Secondary | ICD-10-CM | POA: Diagnosis not present

## 2020-08-23 DIAGNOSIS — I739 Peripheral vascular disease, unspecified: Secondary | ICD-10-CM | POA: Diagnosis not present

## 2020-08-23 DIAGNOSIS — E7849 Other hyperlipidemia: Secondary | ICD-10-CM | POA: Diagnosis not present

## 2020-09-06 DIAGNOSIS — Z0100 Encounter for examination of eyes and vision without abnormal findings: Secondary | ICD-10-CM | POA: Diagnosis not present

## 2020-10-11 ENCOUNTER — Ambulatory Visit: Payer: Self-pay

## 2020-10-11 ENCOUNTER — Ambulatory Visit: Payer: Medicare HMO | Admitting: Specialist

## 2020-10-11 ENCOUNTER — Encounter: Payer: Self-pay | Admitting: Specialist

## 2020-10-11 ENCOUNTER — Other Ambulatory Visit: Payer: Self-pay

## 2020-10-11 VITALS — BP 153/84 | HR 82 | Ht 66.0 in | Wt 230.0 lb

## 2020-10-11 DIAGNOSIS — M4316 Spondylolisthesis, lumbar region: Secondary | ICD-10-CM | POA: Diagnosis not present

## 2020-10-11 DIAGNOSIS — Z4889 Encounter for other specified surgical aftercare: Secondary | ICD-10-CM

## 2020-10-11 DIAGNOSIS — Z981 Arthrodesis status: Secondary | ICD-10-CM

## 2020-10-11 DIAGNOSIS — M48062 Spinal stenosis, lumbar region with neurogenic claudication: Secondary | ICD-10-CM

## 2020-10-11 DIAGNOSIS — M545 Low back pain, unspecified: Secondary | ICD-10-CM

## 2020-10-11 DIAGNOSIS — M25461 Effusion, right knee: Secondary | ICD-10-CM

## 2020-10-11 DIAGNOSIS — M25561 Pain in right knee: Secondary | ICD-10-CM

## 2020-10-11 DIAGNOSIS — M1711 Unilateral primary osteoarthritis, right knee: Secondary | ICD-10-CM | POA: Diagnosis not present

## 2020-10-11 MED ORDER — METHYLPREDNISOLONE ACETATE 40 MG/ML IJ SUSP
40.0000 mg | INTRAMUSCULAR | Status: AC | PRN
  Administered 2020-10-11: 40 mg via INTRA_ARTICULAR

## 2020-10-11 MED ORDER — BUPIVACAINE HCL 0.5 % IJ SOLN
3.0000 mL | INTRAMUSCULAR | Status: AC | PRN
  Administered 2020-10-11: 3 mL via INTRA_ARTICULAR

## 2020-10-11 MED ORDER — MELOXICAM 15 MG PO TABS: 15.0000 mg | ORAL_TABLET | Freq: Every day | ORAL | 3 refills | Status: DC

## 2020-10-11 NOTE — Progress Notes (Addendum)
Office Visit Note   Patient: [name] [name]           Date of Birth: Feb 25, 1951           MRN: [id number] Visit Date: 10/11/2020              Requested by: Juliette AlcideBurdine, Steven E, MD [address] [place],  [place] [id number] PCP: Juliette AlcideBurdine, Steven E, MD   Assessment & Plan: Visit Diagnoses:  1. Unilateral primary osteoarthritis, right knee   2. S/P lumbar fusion L4 to Sacrum 06/22/09 extension of previous L4 to L5 fusion 01/09/04   3. Lumbar pain   4. Right knee pain, unspecified chronicity   5. S/P lumbar fusion   6. Spondylolisthesis, lumbar region   7. Lumbar stenosis with neurogenic claudication   8. Effusion, right knee   9. Encounter for other specified surgical aftercare   10. Spondylolisthesis of lumbar region     Plan: Plan: Knee is suffering from osteoarthritis, only real proven treatments are Weight loss, NSIADs like meloxicam, voltaren gel and exercise. Well padded shoes help. Ice the knee that is suffering from osteoarthritis, only real proven treatments are  Well padded shoes help. Ice the knee 2-3 times a day 15-20 mins at a time.-3 times a day 15-20 mins at a time. Hot showers in the AM.  Injection with steroid may be of benefit. Hemp CBD capsules, amazon.com 5,000-7,000 mg per bottle, 60 capsules per bottle, take one capsule twice a day. Cane in the left hand to use with left leg weight bearing. Follow-Up Instructions: No follow-ups on file.  Avoid bending, stooping and avoid lifting weights greater than 10 lbs. Avoid prolong standing and walking. Avoid frequent bending and stooping  No lifting greater than 10 lbs. May use ice or moist heat for pain. Weight loss is of benefit. Handicap license is approved. MRI is ordered. Continue on gabapentin Meloxicam 15 mg daily.  Follow-Up Instructions: Return in about 3 weeks (around 11/01/2020).   Orders:  Orders Placed This Encounter  Procedures  . Large Joint Inj: R knee  . XR Lumbar Spine 2-3 Views  . XR Knee 1-2  Views Right  . MR Lumbar Spine W Wo Contrast   Meds ordered this encounter  Medications  . meloxicam (MOBIC) 15 MG tablet    Sig: Take 1 tablet (15 mg total) by mouth daily.    Dispense:  90 tablet    Refill:  3  . methylPREDNISolone acetate (DEPO-MEDROL) injection 40 mg  . bupivacaine (MARCAINE) 0.5 % (with pres) injection 3 mL      Procedures: Large Joint Inj: R knee on 10/11/2020 4:16 PM Indications: pain Details: 25 G 1.5 in needle, anterolateral approach  Arthrogram: No  Medications: 40 mg methylPREDNISolone acetate 40 MG/ML; 3 mL bupivacaine 0.5 % Outcome: tolerated well, no immediate complications Procedure, treatment alternatives, risks and benefits explained, specific risks discussed. Consent was given by the patient. Immediately prior to procedure a time out was called to verify the correct patient, procedure, equipment, support staff and site/side marked as required. Patient was prepped and draped in the usual sterile fashion.       Clinical Data: No additional findings.   Subjective: Chief Complaint  Patient presents with  . Lower Back - Pain    Weakness right leg/ gives out at times     70 year old female with history of lumbar fusions L4-5 in 2005, and then extension to the L5-S1 level in 2010. She has been  having pain into  The right leg anteriorly into the right knee and the anterior leg below. The right leg is weak and wants to give out on her. She is unable to walk distance the has to sit. Walking and standing in one place is trouble some. Uses a cart at the grocery story. She has trouble first thing in the AM getting up and standing. No bowel or bladder difficulty. Can not walk a mile and has trouble walking in from distance.    Review of Systems  Constitutional: Negative for activity change, appetite change, chills, diaphoresis, fatigue, fever and unexpected weight change.  HENT: Negative.  Negative for congestion, dental problem, drooling, ear  discharge, ear pain, facial swelling, hearing loss, mouth sores, nosebleeds, postnasal drip, rhinorrhea, sinus pressure, sinus pain, sneezing, sore throat, tinnitus, trouble swallowing and voice change.   Eyes: Positive for visual disturbance. Negative for photophobia, pain, discharge, redness and itching.  Respiratory: Positive for apnea, shortness of breath and wheezing. Negative for cough, choking, chest tightness and stridor.   Cardiovascular: Positive for leg swelling. Negative for chest pain and palpitations.  Gastrointestinal: Negative.  Negative for abdominal distention, abdominal pain, anal bleeding, blood in stool, constipation, diarrhea, nausea, rectal pain and vomiting.  Endocrine: Negative.  Negative for cold intolerance, heat intolerance, polydipsia, polyphagia and polyuria.  Genitourinary: Negative.  Negative for difficulty urinating, dyspareunia, dysuria, enuresis, flank pain, frequency, genital sores and urgency.  Musculoskeletal: Positive for back pain and gait problem. Negative for arthralgias, joint swelling, myalgias, neck pain and neck stiffness.  Skin: Negative.  Negative for color change, pallor, rash and wound.  Allergic/Immunologic: Negative.  Negative for environmental allergies, food allergies and immunocompromised state.  Neurological: Positive for weakness. Negative for dizziness, tremors, seizures, syncope, facial asymmetry, speech difficulty, light-headedness, numbness and headaches.  Hematological: Negative.  Negative for adenopathy. Does not bruise/bleed easily.  Psychiatric/Behavioral: Positive for sleep disturbance. Negative for agitation, behavioral problems, confusion, decreased concentration, dysphoric mood, hallucinations, self-injury and suicidal ideas. The patient is nervous/anxious. The patient is not hyperactive.      Objective: Vital Signs: BP (!) 153/84   Pulse 82   Ht 5\' 6"  (1.676 m)   Wt 230 lb (104.3 kg)   BMI 37.12 kg/m   Physical  Exam Constitutional:      Appearance: She is well-developed.  HENT:     Head: Normocephalic and atraumatic.  Eyes:     Pupils: Pupils are equal, round, and reactive to light.  Pulmonary:     Effort: Pulmonary effort is normal.     Breath sounds: Normal breath sounds.  Abdominal:     General: Bowel sounds are normal.     Palpations: Abdomen is soft.  Musculoskeletal:        General: Normal range of motion.     Cervical back: Normal range of motion and neck supple.  Skin:    General: Skin is warm and dry.  Neurological:     Mental Status: She is alert and oriented to person, place, and time.  Psychiatric:        Behavior: Behavior normal.        Thought Content: Thought content normal.        Judgment: Judgment normal.     Ortho Exam  Specialty Comments:  No specialty comments available.  Imaging: XR Lumbar Spine 2-3 Views  Result Date: 10/11/2020 Ap and lateral flexion and extension radiographs of the lumbar spine demonstrate L4-S1 fusion with pedicle screws and rods and cages this appears  solid. There is proximal adjacent level degenerative disc changes with anterior disc subchondral sclerosis and anterior osteophytes the disc is collapsed with anterolisthesis of L3 on L4. There is less degeneration noted at L1-2 and L2-3 though anterior osteophytes are present.     PMFS History: Patient Active Problem List   Diagnosis Date Noted  . S/P lumbar fusion L4 to Sacrum 06/22/09 10/11/2020  . Myalgia and myositis 06/12/2018  . Other and unspecified nonspecific immunological findings 04/25/2018  . Osteoarthrosis, hand 10/19/2017  . Insomnia 09/23/2015  . Plantar fascial fibromatosis 09/23/2015  . Shortness of breath 04/28/2015  . Abnormal myocardial perfusion study   . Disturbance of skin sensation 08/05/2014  . Chronic kidney disease, stage III (moderate) (HCC) 02/02/2014  . Impaired fasting glucose 02/02/2014  . Vitamin D deficiency 10/30/2013  . Other osteoporosis  07/29/2012  . Myocardial infarction (HCC) 12/29/2010  . Cardiovascular disease 05/26/2009  . KNEE PAIN, LEFT 07/02/2008  . HIP PAIN 04/27/2008  . OBESITY 02/24/2008  . HYPERGLYCEMIA, FASTING 02/10/2008  . GERD 03/12/2007  . ANXIETY DEPRESSION 08/28/2006  . DEGENERATIVE DISC DISEASE 08/28/2006  . HYPERLIPIDEMIA 05/29/2006  . CARPAL TUNNEL SYNDROME 05/29/2006  . HYPERTENSION 05/29/2006  . Unspecified constipation 05/29/2006  . IBS 05/29/2006  . ARTHRITIS 05/29/2006  . LOW BACK PAIN 05/29/2006   Past Medical History:  Diagnosis Date  . Arthritis   . Carpal tunnel syndrome   . Chronic kidney disease (CKD), stage II (mild)   . Constipation   . Coronary artery disease    NSTEMI 2004, DES to ostial RCA  . DDD (degenerative disc disease)   . Essential hypertension   . GERD (gastroesophageal reflux disease)   . Hyperlipidemia   . IBS (irritable bowel syndrome)   . Low back pain   . Myocardial infarction (HCC) 2004  . Postmenopausal   . Pulmonary nodule   . Sleep apnea    "waiting on machine to come".  . Type 2 diabetes mellitus (HCC)     Family History  Problem Relation Age of Onset  . Stroke Mother   . Diabetes Mellitus II Mother   . Kidney disease Father   . Hypertension Brother   . Diabetes Mellitus II Brother   . Diabetes Mellitus II Sister     Past Surgical History:  Procedure Laterality Date  . ABDOMINAL HYSTERECTOMY    . CARDIAC CATHETERIZATION N/A 04/28/2015   Procedure: Left Heart Cath and Coronary Angiography;  Surgeon: Tonny Bollman, MD;  Location: [facility] INVAS[facility] CV LAB;  Service: Cardiovascular;  Laterality: N/A;  . CATARACT EXTRACTION W/PHACO Right 07/19/2020   Procedure: CATARACT EXTRACTION PHACO AND INTRAOCULAR LENS PLACEMENT RIGHT EYE;  Surgeon: Fabio Pierce, MD;  Location: [facility] ORS;  Service: Ophthalmology;  Laterality: Right;  right CDE=12.56  . CATARACT EXTRACTION W/PHACO Left 08/09/2020   Procedure: CATARACT EXTRACTION PHACO AND INTRAOCULAR LENS  PLACEMENT (IOC);  Surgeon: Fabio Pierce, MD;  Location: [facility] ORS;  Service: Ophthalmology;  Laterality: Left;  CDE: 6.66  . Cervical disectomy and fusion    . Cholestectomy    . COLONOSCOPY N/A 05/07/2019   Procedure: COLONOSCOPY;  Surgeon: Corbin Ade, MD;  Location: [facility] SU[facility];  Service: Endoscopy;  Laterality: N/A;  12:00  . Excision of ovarian cyst    . L4-5 with pedicle screws and rods    . Right shoulder RTC     Social History   Occupational History  . Occupation: Psychiatric nurse, Government social research officer  Tobacco Use  . Smoking status: Never Smoker  .  Smokeless tobacco: Never Used  Vaping Use  . Vaping Use: Never used  Substance and Sexual Activity  . Alcohol use: No    Alcohol/week: 0.0 standard drinks  . Drug use: No  . Sexual activity: Never

## 2020-10-11 NOTE — Patient Instructions (Addendum)
Plan: Knee is suffering from osteoarthritis, only real proven treatments are Weight loss, NSIADs like meloxicam, voltaren gel and exercise. Well padded shoes help. Ice the knee that is suffering from osteoarthritis, only real proven treatments are  Well padded shoes help. Ice the knee 2-3 times a day 15-20 mins at a time.-3 times a day 15-20 mins at a time. Hot showers in the AM.  Injection with steroid may be of benefit. Hemp CBD capsules, amazon.com 5,000-7,000 mg per bottle, 60 capsules per bottle, take one capsule twice a day. Cane in the left hand to use with left leg weight bearing. Follow-Up Instructions: No follow-ups on file.  Avoid bending, stooping and avoid lifting weights greater than 10 lbs. Avoid prolong standing and walking. Avoid frequent bending and stooping  No lifting greater than 10 lbs. May use ice or moist heat for pain. Weight loss is of benefit. Handicap license is approved. MRI is ordered. Continue on gabapentin Meloxicam 15 mg daily.

## 2020-10-18 ENCOUNTER — Telehealth: Payer: Self-pay | Admitting: Specialist

## 2020-10-18 NOTE — Telephone Encounter (Signed)
Called pt 1X and left vm to call and set MRI review appt with Dr. Otelia Sergeant after 5/9. Will try again later.

## 2020-11-01 ENCOUNTER — Ambulatory Visit (HOSPITAL_COMMUNITY)
Admission: RE | Admit: 2020-11-01 | Discharge: 2020-11-01 | Disposition: A | Discharge status: Home / Self Care | Payer: Medicare HMO | Source: Ambulatory Visit | Attending: Specialist | Admitting: Specialist

## 2020-11-01 ENCOUNTER — Other Ambulatory Visit: Payer: Self-pay

## 2020-11-01 DIAGNOSIS — Z4889 Encounter for other specified surgical aftercare: Secondary | ICD-10-CM | POA: Insufficient documentation

## 2020-11-01 DIAGNOSIS — M4316 Spondylolisthesis, lumbar region: Secondary | ICD-10-CM | POA: Diagnosis not present

## 2020-11-01 DIAGNOSIS — M5126 Other intervertebral disc displacement, lumbar region: Secondary | ICD-10-CM | POA: Diagnosis not present

## 2020-11-01 DIAGNOSIS — M48061 Spinal stenosis, lumbar region without neurogenic claudication: Secondary | ICD-10-CM | POA: Diagnosis not present

## 2020-11-01 MED ORDER — GADOBUTROL 1 MMOL/ML IV SOLN
7.0000 mL | Freq: Once | INTRAVENOUS | Status: AC | PRN
  Administered 2020-11-01: 7 mL via INTRAVENOUS

## 2020-11-18 ENCOUNTER — Other Ambulatory Visit: Payer: Self-pay

## 2020-11-18 ENCOUNTER — Encounter: Payer: Self-pay | Admitting: Specialist

## 2020-11-18 ENCOUNTER — Ambulatory Visit: Payer: Medicare HMO | Admitting: Specialist

## 2020-11-18 VITALS — BP 138/80 | HR 76 | Ht 66.0 in | Wt 230.0 lb

## 2020-11-18 DIAGNOSIS — M48062 Spinal stenosis, lumbar region with neurogenic claudication: Secondary | ICD-10-CM | POA: Diagnosis not present

## 2020-11-18 DIAGNOSIS — M4316 Spondylolisthesis, lumbar region: Secondary | ICD-10-CM

## 2020-11-18 DIAGNOSIS — G4733 Obstructive sleep apnea (adult) (pediatric): Secondary | ICD-10-CM | POA: Diagnosis not present

## 2020-11-18 DIAGNOSIS — Z981 Arthrodesis status: Secondary | ICD-10-CM

## 2020-11-18 DIAGNOSIS — G4710 Hypersomnia, unspecified: Secondary | ICD-10-CM | POA: Diagnosis not present

## 2020-11-18 DIAGNOSIS — I10 Essential (primary) hypertension: Secondary | ICD-10-CM | POA: Diagnosis not present

## 2020-11-18 NOTE — Patient Instructions (Signed)
Avoid bending, stooping and avoid lifting weights greater than 10 lbs. Avoid prolong standing and walking. Order for a new walker with wheels. Surgery scheduling secretary Tivis Ringer, will call you in the next week to schedule for surgery.  Surgery recommended is a one level lumbar fusion L3-4 above the L4 to SI fusion this would be done with rods, screws and cages with local bone graft and allograft (donor bone graft). Also right L2-3 partial hemilaminectomy for lateral recess narrowing without fusion at this level.  Risk of surgery includes risk of infection 1 in 200 patients, bleeding 1/2% chance you would need a transfusion.   Risk to the nerves is one in 10,000. You will need to use a brace for 3 months and wean from the brace on the 4th month. Expect improved walking and standing tolerance. Expect relief of leg pain but numbness may persist depending on the length and degree of pressure that has been present.

## 2020-11-18 NOTE — Progress Notes (Signed)
Office Visit Note   Patient: [name] [name]           Date of Birth: 01-15-1951           MRN: [id number] Visit Date: 11/18/2020              Requested by: Juliette Alcide, MD [address] [place],  [place] [address] PCP: Juliette Alcide, MD   Assessment & Plan: Visit Diagnoses:  1. Spondylolisthesis of lumbar region   2. S/P lumbar fusion L4 to Sacrum 06/22/09 extension of previous L4 to L5 fusion 01/09/04   3. Spinal stenosis of lumbar region with neurogenic claudication     Plan: Avoid bending, stooping and avoid lifting weights greater than 10 lbs. Avoid prolong standing and walking. Order for a new walker with wheels. Surgery scheduling secretary Tivis Ringer, will call you in the next week to schedule for surgery.  Surgery recommended is a one level lumbar fusion L3-4 above the L4 to SI fusion this would be done with rods, screws, fasteners and cages with local bone graft and allograft (donor bone graft). Also right L2-3 partial hemilaminectomy for lateral recess narrowing without fusion at this level.  Risk of surgery includes risk of infection 1 in 200 patients, bleeding 1/2% chance you would need a transfusion.   Risk to the nerves is one in 10,000. You will need to use a brace for 3 months and wean from the brace on the 4th month. Expect improved walking and standing tolerance. Expect relief of leg pain but numbness may persist depending on the length and degree of pressure that has been present.  Follow-Up Instructions: Return in about 4 weeks (around 12/16/2020).   Orders:  No orders of the defined types were placed in this encounter.  No orders of the defined types were placed in this encounter.     Procedures: No procedures performed   Clinical Data: No additional findings.   Subjective: Chief Complaint  Patient presents with  . Lower Back - Follow-up    MRI Review    HPI  Review of Systems  Constitutional: Negative.   HENT: Negative.   Eyes:  Negative.   Respiratory: Negative.   Cardiovascular: Negative.   Gastrointestinal: Negative.   Endocrine: Negative.   Genitourinary: Negative.   Musculoskeletal: Negative.   Skin: Negative.   Allergic/Immunologic: Negative.   Neurological: Negative.   Hematological: Negative.   Psychiatric/Behavioral: Negative.      Objective: Vital Signs: BP 138/80   Pulse 76   Ht 5\' 6"  (1.676 m)   Wt 230 lb (104.3 kg)   BMI 37.12 kg/m   Physical Exam Constitutional:      Appearance: She is well-developed.  HENT:     Head: Normocephalic and atraumatic.  Eyes:     Pupils: Pupils are equal, round, and reactive to light.  Pulmonary:     Effort: Pulmonary effort is normal.     Breath sounds: Normal breath sounds.  Abdominal:     General: Bowel sounds are normal.     Palpations: Abdomen is soft.  Musculoskeletal:        General: Normal range of motion.     Cervical back: Normal range of motion and neck supple.  Skin:    General: Skin is warm and dry.  Neurological:     Mental Status: She is alert and oriented to person, place, and time.  Psychiatric:        Behavior: Behavior normal.  Thought Content: Thought content normal.        Judgment: Judgment normal.     Ortho Exam  Specialty Comments:  No specialty comments available.  Imaging: No results found.   PMFS History: Patient Active Problem List   Diagnosis Date Noted  . S/P lumbar fusion L4 to Sacrum 06/22/09 10/11/2020  . Myalgia and myositis 06/12/2018  . Other and unspecified nonspecific immunological findings 04/25/2018  . Osteoarthrosis, hand 10/19/2017  . Insomnia 09/23/2015  . Plantar fascial fibromatosis 09/23/2015  . Shortness of breath 04/28/2015  . Abnormal myocardial perfusion study   . Disturbance of skin sensation 08/05/2014  . Chronic kidney disease, stage III (moderate) (HCC) 02/02/2014  . Impaired fasting glucose 02/02/2014  . Vitamin D deficiency 10/30/2013  . Other osteoporosis  07/29/2012  . Myocardial infarction (HCC) 12/29/2010  . Cardiovascular disease 05/26/2009  . KNEE PAIN, LEFT 07/02/2008  . HIP PAIN 04/27/2008  . OBESITY 02/24/2008  . HYPERGLYCEMIA, FASTING 02/10/2008  . GERD 03/12/2007  . ANXIETY DEPRESSION 08/28/2006  . DEGENERATIVE DISC DISEASE 08/28/2006  . HYPERLIPIDEMIA 05/29/2006  . CARPAL TUNNEL SYNDROME 05/29/2006  . HYPERTENSION 05/29/2006  . Unspecified constipation 05/29/2006  . IBS 05/29/2006  . ARTHRITIS 05/29/2006  . LOW BACK PAIN 05/29/2006   Past Medical History:  Diagnosis Date  . Arthritis   . Carpal tunnel syndrome   . Chronic kidney disease (CKD), stage II (mild)   . Constipation   . Coronary artery disease    NSTEMI 2004, DES to ostial RCA  . DDD (degenerative disc disease)   . Essential hypertension   . GERD (gastroesophageal reflux disease)   . Hyperlipidemia   . IBS (irritable bowel syndrome)   . Low back pain   . Myocardial infarction (HCC) 2004  . Postmenopausal   . Pulmonary nodule   . Sleep apnea    "waiting on machine to come".  . Type 2 diabetes mellitus (HCC)     Family History  Problem Relation Age of Onset  . Stroke Mother   . Diabetes Mellitus II Mother   . Kidney disease Father   . Hypertension Brother   . Diabetes Mellitus II Brother   . Diabetes Mellitus II Sister     Past Surgical History:  Procedure Laterality Date  . ABDOMINAL HYSTERECTOMY    . CARDIAC CATHETERIZATION N/A 04/28/2015   Procedure: Left Heart Cath and Coronary Angiography;  Surgeon: Tonny Bollman, MD;  Location: [facility] INVASIVE CV LAB;  Service: Cardiovascular;  Laterality: N/A;  . CATARACT EXTRACTION W/PHACO Right 07/19/2020   Procedure: CATARACT EXTRACTION PHACO AND INTRAOCULAR LENS PLACEMENT RIGHT EYE;  Surgeon: Fabio Pierce, MD;  Location: [facility] ORS;  Service: Ophthalmology;  Laterality: Right;  right CDE=12.56  . CATARACT EXTRACTION W/PHACO Left 08/09/2020   Procedure: CATARACT EXTRACTION PHACO AND INTRAOCULAR LENS  PLACEMENT (IOC);  Surgeon: Fabio Pierce, MD;  Location: [facility] ORS;  Service: Ophthalmology;  Laterality: Left;  CDE: 6.66  . Cervical disectomy and fusion    . Cholestectomy    . COLONOSCOPY N/A 05/07/2019   Procedure: COLONOSCOPY;  Surgeon: Corbin Ade, MD;  Location: [facility];  Service: Endoscopy;  Laterality: N/A;  12:00  . Excision of ovarian cyst    . L4-5 with pedicle screws and rods    . Right shoulder RTC     Social History   Occupational History  . Occupation: Psychiatric nurse, Government social research officer  Tobacco Use  . Smoking status: Never Smoker  . Smokeless tobacco: Never Used  Vaping Use  .  Vaping Use: Never used  Substance and Sexual Activity  . Alcohol use: No    Alcohol/week: 0.0 standard drinks  . Drug use: No  . Sexual activity: Never

## 2020-11-30 ENCOUNTER — Telehealth: Payer: Self-pay

## 2020-11-30 NOTE — Telephone Encounter (Signed)
Started the blue sheet for him

## 2020-11-30 NOTE — Telephone Encounter (Signed)
Patient called and inquired about scheduling surgery.  Need surgery sheet.  Thanks!

## 2020-12-19 DIAGNOSIS — G4733 Obstructive sleep apnea (adult) (pediatric): Secondary | ICD-10-CM | POA: Diagnosis not present

## 2020-12-19 DIAGNOSIS — G4710 Hypersomnia, unspecified: Secondary | ICD-10-CM | POA: Diagnosis not present

## 2020-12-19 DIAGNOSIS — I10 Essential (primary) hypertension: Secondary | ICD-10-CM | POA: Diagnosis not present

## 2020-12-23 ENCOUNTER — Telehealth: Payer: Self-pay | Admitting: Specialist

## 2020-12-23 ENCOUNTER — Ambulatory Visit: Payer: Medicare HMO | Admitting: Specialist

## 2020-12-23 DIAGNOSIS — R7301 Impaired fasting glucose: Secondary | ICD-10-CM | POA: Diagnosis not present

## 2020-12-23 DIAGNOSIS — D559 Anemia due to enzyme disorder, unspecified: Secondary | ICD-10-CM | POA: Diagnosis not present

## 2020-12-23 DIAGNOSIS — I2510 Atherosclerotic heart disease of native coronary artery without angina pectoris: Secondary | ICD-10-CM | POA: Diagnosis not present

## 2020-12-23 DIAGNOSIS — N183 Chronic kidney disease, stage 3 unspecified: Secondary | ICD-10-CM | POA: Diagnosis not present

## 2020-12-23 DIAGNOSIS — Z955 Presence of coronary angioplasty implant and graft: Secondary | ICD-10-CM | POA: Diagnosis not present

## 2020-12-23 DIAGNOSIS — Z6837 Body mass index (BMI) 37.0-37.9, adult: Secondary | ICD-10-CM | POA: Diagnosis not present

## 2020-12-23 DIAGNOSIS — I10 Essential (primary) hypertension: Secondary | ICD-10-CM | POA: Diagnosis not present

## 2020-12-23 DIAGNOSIS — N182 Chronic kidney disease, stage 2 (mild): Secondary | ICD-10-CM | POA: Diagnosis not present

## 2020-12-23 DIAGNOSIS — M545 Low back pain, unspecified: Secondary | ICD-10-CM | POA: Diagnosis not present

## 2020-12-23 DIAGNOSIS — E782 Mixed hyperlipidemia: Secondary | ICD-10-CM | POA: Diagnosis not present

## 2020-12-23 DIAGNOSIS — I129 Hypertensive chronic kidney disease with stage 1 through stage 4 chronic kidney disease, or unspecified chronic kidney disease: Secondary | ICD-10-CM | POA: Diagnosis not present

## 2020-12-23 DIAGNOSIS — G4733 Obstructive sleep apnea (adult) (pediatric): Secondary | ICD-10-CM | POA: Diagnosis not present

## 2020-12-23 DIAGNOSIS — E7849 Other hyperlipidemia: Secondary | ICD-10-CM | POA: Diagnosis not present

## 2020-12-23 DIAGNOSIS — I739 Peripheral vascular disease, unspecified: Secondary | ICD-10-CM | POA: Diagnosis not present

## 2020-12-23 DIAGNOSIS — G8929 Other chronic pain: Secondary | ICD-10-CM | POA: Diagnosis not present

## 2020-12-23 DIAGNOSIS — Z981 Arthrodesis status: Secondary | ICD-10-CM | POA: Diagnosis not present

## 2020-12-23 DIAGNOSIS — Z01818 Encounter for other preprocedural examination: Secondary | ICD-10-CM | POA: Diagnosis not present

## 2020-12-23 NOTE — Telephone Encounter (Signed)
11/18/20 ov note faxed to [facility]. She is there now to get surgery clearance. Fax [phone]

## 2020-12-28 DIAGNOSIS — I10 Essential (primary) hypertension: Secondary | ICD-10-CM | POA: Diagnosis not present

## 2020-12-28 DIAGNOSIS — Z954 Presence of other heart-valve replacement: Secondary | ICD-10-CM | POA: Diagnosis not present

## 2020-12-28 DIAGNOSIS — Z01810 Encounter for preprocedural cardiovascular examination: Secondary | ICD-10-CM | POA: Diagnosis not present

## 2020-12-28 DIAGNOSIS — Z955 Presence of coronary angioplasty implant and graft: Secondary | ICD-10-CM | POA: Diagnosis not present

## 2020-12-28 DIAGNOSIS — Z01818 Encounter for other preprocedural examination: Secondary | ICD-10-CM | POA: Diagnosis not present

## 2020-12-28 DIAGNOSIS — I2510 Atherosclerotic heart disease of native coronary artery without angina pectoris: Secondary | ICD-10-CM | POA: Diagnosis not present

## 2021-01-18 DIAGNOSIS — G4710 Hypersomnia, unspecified: Secondary | ICD-10-CM | POA: Diagnosis not present

## 2021-01-18 DIAGNOSIS — I10 Essential (primary) hypertension: Secondary | ICD-10-CM | POA: Diagnosis not present

## 2021-01-18 DIAGNOSIS — G4733 Obstructive sleep apnea (adult) (pediatric): Secondary | ICD-10-CM | POA: Diagnosis not present

## 2021-01-23 DIAGNOSIS — E7849 Other hyperlipidemia: Secondary | ICD-10-CM | POA: Diagnosis not present

## 2021-01-23 DIAGNOSIS — N183 Chronic kidney disease, stage 3 unspecified: Secondary | ICD-10-CM | POA: Diagnosis not present

## 2021-01-23 DIAGNOSIS — I129 Hypertensive chronic kidney disease with stage 1 through stage 4 chronic kidney disease, or unspecified chronic kidney disease: Secondary | ICD-10-CM | POA: Diagnosis not present

## 2021-01-23 DIAGNOSIS — I739 Peripheral vascular disease, unspecified: Secondary | ICD-10-CM | POA: Diagnosis not present

## 2021-01-25 NOTE — Progress Notes (Signed)
Surgical Instructions    Your procedure is scheduled on Monday, August 8th, 2022 .  Report to [facility] Main Entrance "A" at 05:30 A.M., then check in with the Admitting office.  Call this number if you have problems the morning of surgery:  [phone]   If you have any questions prior to your surgery date call [phone]: Open Monday-Friday 8am-4pm    Remember:  Do not eat or drink after midnight the night before your surgery    Take these medicines the morning of surgery with A SIP OF WATER:  amLODipine (NORVASC)  atorvastatin (LIPITOR)  gabapentin (NEURONTIN)  If needed:  acetaminophen (TYLENOL) famotidine (PEPCID)   Follow your surgeon's instructions on when to stop Aspirin.  If no instructions were given by your surgeon then you will need to call the office to get those instructions.     As of today, STOP taking any Aspirin (unless otherwise instructed by your surgeon) Aleve, Naproxen, Ibuprofen, Motrin, Advil, Goody's, BC's, all herbal medications, fish oil, and all vitamins.  WHAT DO I DO ABOUT MY DIABETES MEDICATION?   Do not take oral metFORMIN (GLUCOPHAGE) the morning of surgery.   HOW TO MANAGE YOUR DIABETES BEFORE AND AFTER SURGERY  Why is it important to control my blood sugar before and after surgery? Improving blood sugar levels before and after surgery helps healing and can limit problems. A way of improving blood sugar control is eating a healthy diet by:  Eating less sugar and carbohydrates  Increasing activity/exercise  Talking with your doctor about reaching your blood sugar goals High blood sugars (greater than 180 mg/dL) can raise your risk of infections and slow your recovery, so you will need to focus on controlling your diabetes during the weeks before surgery. Make sure that the doctor who takes care of your diabetes knows about your planned surgery including the date and location.  How do I manage my blood sugar before surgery? Check  your blood sugar at least 4 times a day, starting 2 days before surgery, to make sure that the level is not too high or low.  Check your blood sugar the morning of your surgery when you wake up and every 2 hours until you get to the Short Stay unit.  If your blood sugar is less than 70 mg/dL, you will need to treat for low blood sugar: Do not take insulin. Treat a low blood sugar (less than 70 mg/dL) with  cup of clear juice (cranberry or apple), 4 glucose tablets, OR glucose gel. Recheck blood sugar in 15 minutes after treatment (to make sure it is greater than 70 mg/dL). If your blood sugar is not greater than 70 mg/dL on recheck, call [phone] for further instructions. Report your blood sugar to the short stay nurse when you get to Short Stay.  If you are admitted to the hospital after surgery: Your blood sugar will be checked by the staff and you will probably be given insulin after surgery (instead of oral diabetes medicines) to make sure you have good blood sugar levels. The goal for blood sugar control after surgery is 80-180 mg/dL.           Do not wear jewelry or makeup Do not wear lotions, powders, perfumes, or deodorant. Do not shave 48 hours prior to surgery.   Do not bring valuables to the hospital. DO Not wear nail polish, gel polish, artificial nails, or any other type of covering on natural nails including finger and toenails. If  patients have artificial nails, gel coating, etc. that need to be removed by a nail salon please have this removed prior to surgery or surgery may need to be canceled/delayed if the surgeon/ anesthesia feels like the patient is unable to be adequately monitored.             [facility] is not responsible for any belongings or valuables.  Do NOT Smoke (Tobacco/Vaping) or drink Alcohol 24 hours prior to your procedure If you use a CPAP at night, you may bring all equipment for your overnight stay.   Contacts, glasses, dentures or bridgework may  not be worn into surgery, please bring cases for these belongings   For patients admitted to the hospital, discharge time will be determined by your treatment team.   Patients discharged the day of surgery will not be allowed to drive home, and someone needs to stay with them for 24 hours.  ONLY 1 SUPPORT PERSON MAY BE PRESENT WHILE YOU ARE IN SURGERY. IF YOU ARE TO BE ADMITTED ONCE YOU ARE IN YOUR ROOM YOU WILL BE ALLOWED TWO (2) VISITORS.  Minor children may have two parents present. Special consideration for safety and communication needs will be reviewed on a case by case basis.  Special instructions:    Oral Hygiene is also important to reduce your risk of infection.  Remember - BRUSH YOUR TEETH THE MORNING OF SURGERY WITH YOUR REGULAR TOOTHPASTE   [facility]- Preparing For Surgery  Before surgery, you can play an important role. Because skin is not sterile, your skin needs to be as free of germs as possible. You can reduce the number of germs on your skin by washing with CHG (chlorahexidine gluconate) Soap before surgery.  CHG is an antiseptic cleaner which kills germs and bonds with the skin to continue killing germs even after washing.     Please do not use if you have an allergy to CHG or antibacterial soaps. If your skin becomes reddened/irritated stop using the CHG.  Do not shave (including legs and underarms) for at least 48 hours prior to first CHG shower. It is OK to shave your face.  Please follow these instructions carefully.     Shower the NIGHT BEFORE SURGERY and the MORNING OF SURGERY with CHG Soap.   If you chose to wash your hair, wash your hair first as usual with your normal shampoo. After you shampoo, rinse your hair and body thoroughly to remove the shampoo.  Then [employer] and genitals (private parts) with your normal soap and rinse thoroughly to remove soap.  After that Use CHG Soap as you would any other liquid soap. You can apply CHG directly to the skin and  wash gently with a scrungie or a clean washcloth.   Apply the CHG Soap to your body ONLY FROM THE NECK DOWN.  Do not use on open wounds or open sores. Avoid contact with your eyes, ears, mouth and genitals (private parts). Wash Face and genitals (private parts)  with your normal soap.   Wash thoroughly, paying special attention to the area where your surgery will be performed.  Thoroughly rinse your body with warm water from the neck down.  DO NOT shower/wash with your normal soap after using and rinsing off the CHG Soap.  Pat yourself dry with a CLEAN TOWEL.  Wear CLEAN PAJAMAS to bed the night before surgery  Place CLEAN SHEETS on your bed the night before your surgery  DO NOT SLEEP WITH PETS.  Day of Surgery:  Take a shower with CHG soap. Wear Clean/Comfortable clothing the morning of surgery Do not apply any deodorants/lotions.   Remember to brush your teeth WITH YOUR REGULAR TOOTHPASTE.   Please read over the following fact sheets that you were given.

## 2021-01-26 ENCOUNTER — Encounter: Payer: Self-pay | Admitting: Surgery

## 2021-01-26 ENCOUNTER — Encounter (HOSPITAL_COMMUNITY): Payer: Self-pay

## 2021-01-26 ENCOUNTER — Other Ambulatory Visit: Payer: Self-pay

## 2021-01-26 ENCOUNTER — Encounter (HOSPITAL_COMMUNITY)
Admission: RE | Admit: 2021-01-26 | Discharge: 2021-01-26 | Disposition: A | Discharge status: Home / Self Care | Payer: Medicare HMO | Source: Ambulatory Visit | Attending: Specialist | Admitting: Specialist

## 2021-01-26 ENCOUNTER — Encounter (HOSPITAL_COMMUNITY)
Admission: RE | Admit: 2021-01-26 | Discharge: 2021-01-26 | Disposition: A | Discharge status: Home / Self Care | Payer: Medicare HMO | Source: Ambulatory Visit | Attending: Surgery | Admitting: Surgery

## 2021-01-26 ENCOUNTER — Ambulatory Visit (INDEPENDENT_AMBULATORY_CARE_PROVIDER_SITE_OTHER): Payer: Medicare HMO | Admitting: Surgery

## 2021-01-26 VITALS — BP 149/86 | HR 76 | Ht 67.0 in | Wt 233.0 lb

## 2021-01-26 DIAGNOSIS — Z791 Long term (current) use of non-steroidal anti-inflammatories (NSAID): Secondary | ICD-10-CM | POA: Diagnosis not present

## 2021-01-26 DIAGNOSIS — Z01818 Encounter for other preprocedural examination: Secondary | ICD-10-CM | POA: Diagnosis not present

## 2021-01-26 DIAGNOSIS — Z6836 Body mass index (BMI) 36.0-36.9, adult: Secondary | ICD-10-CM | POA: Diagnosis not present

## 2021-01-26 DIAGNOSIS — E785 Hyperlipidemia, unspecified: Secondary | ICD-10-CM | POA: Diagnosis not present

## 2021-01-26 DIAGNOSIS — Z7982 Long term (current) use of aspirin: Secondary | ICD-10-CM | POA: Diagnosis not present

## 2021-01-26 DIAGNOSIS — Z7984 Long term (current) use of oral hypoglycemic drugs: Secondary | ICD-10-CM | POA: Diagnosis not present

## 2021-01-26 DIAGNOSIS — I252 Old myocardial infarction: Secondary | ICD-10-CM | POA: Diagnosis not present

## 2021-01-26 DIAGNOSIS — Z955 Presence of coronary angioplasty implant and graft: Secondary | ICD-10-CM | POA: Diagnosis not present

## 2021-01-26 DIAGNOSIS — R918 Other nonspecific abnormal finding of lung field: Secondary | ICD-10-CM | POA: Insufficient documentation

## 2021-01-26 DIAGNOSIS — Z981 Arthrodesis status: Secondary | ICD-10-CM | POA: Diagnosis not present

## 2021-01-26 DIAGNOSIS — M48062 Spinal stenosis, lumbar region with neurogenic claudication: Secondary | ICD-10-CM

## 2021-01-26 DIAGNOSIS — Z79899 Other long term (current) drug therapy: Secondary | ICD-10-CM | POA: Diagnosis not present

## 2021-01-26 DIAGNOSIS — E1122 Type 2 diabetes mellitus with diabetic chronic kidney disease: Secondary | ICD-10-CM | POA: Insufficient documentation

## 2021-01-26 DIAGNOSIS — E669 Obesity, unspecified: Secondary | ICD-10-CM | POA: Insufficient documentation

## 2021-01-26 DIAGNOSIS — I129 Hypertensive chronic kidney disease with stage 1 through stage 4 chronic kidney disease, or unspecified chronic kidney disease: Secondary | ICD-10-CM | POA: Insufficient documentation

## 2021-01-26 DIAGNOSIS — M4316 Spondylolisthesis, lumbar region: Secondary | ICD-10-CM

## 2021-01-26 DIAGNOSIS — I2510 Atherosclerotic heart disease of native coronary artery without angina pectoris: Secondary | ICD-10-CM | POA: Diagnosis not present

## 2021-01-26 DIAGNOSIS — N182 Chronic kidney disease, stage 2 (mild): Secondary | ICD-10-CM | POA: Diagnosis not present

## 2021-01-26 DIAGNOSIS — K219 Gastro-esophageal reflux disease without esophagitis: Secondary | ICD-10-CM | POA: Insufficient documentation

## 2021-01-26 LAB — HEMOGLOBIN A1C
Hgb A1c MFr Bld: 6.1 % — ABNORMAL HIGH (ref 4.8–5.6)
Mean Plasma Glucose: 128.37 mg/dL

## 2021-01-26 LAB — CBC
HCT: 42.6 % (ref 36.0–46.0)
Hemoglobin: 13.8 g/dL (ref 12.0–15.0)
MCH: 29.9 pg (ref 26.0–34.0)
MCHC: 32.4 g/dL (ref 30.0–36.0)
MCV: 92.4 fL (ref 80.0–100.0)
Platelets: 192 10*3/uL (ref 150–400)
RBC: 4.61 MIL/uL (ref 3.87–5.11)
RDW: 13.1 % (ref 11.5–15.5)
WBC: 5.1 10*3/uL (ref 4.0–10.5)
nRBC: 0 % (ref 0.0–0.2)

## 2021-01-26 LAB — COMPREHENSIVE METABOLIC PANEL
ALT: 21 U/L (ref 0–44)
AST: 27 U/L (ref 15–41)
Albumin: 4.1 g/dL (ref 3.5–5.0)
Alkaline Phosphatase: 66 U/L (ref 38–126)
Anion gap: 10 (ref 5–15)
BUN: 12 mg/dL (ref 8–23)
CO2: 25 mmol/L (ref 22–32)
Calcium: 10 mg/dL (ref 8.9–10.3)
Chloride: 104 mmol/L (ref 98–111)
Creatinine, Ser: 0.97 mg/dL (ref 0.44–1.00)
GFR, Estimated: >60 mL/min (ref >60)
Glucose, Bld: 104 mg/dL — ABNORMAL HIGH (ref 70–99)
Potassium: 3.5 mmol/L (ref 3.5–5.1)
Sodium: 139 mmol/L (ref 135–145)
Total Bilirubin: 1 mg/dL (ref 0.3–1.2)
Total Protein: 7.3 g/dL (ref 6.5–8.1)

## 2021-01-26 LAB — TYPE AND SCREEN
ABO/RH(D): B POS
Antibody Screen: NEGATIVE

## 2021-01-26 LAB — SURGICAL PCR SCREEN
MRSA, PCR: NEGATIVE
Staphylococcus aureus: NEGATIVE

## 2021-01-26 LAB — PROTIME-INR
INR: 1 (ref 0.8–1.2)
Prothrombin Time: 13.5 seconds (ref 11.4–15.2)

## 2021-01-26 LAB — GLUCOSE, CAPILLARY: Glucose-Capillary: 93 mg/dL (ref 70–99)

## 2021-01-26 NOTE — Progress Notes (Signed)
70 year old female with history of L3-4 HNP/stenosis, right greater than left lower extremity radiculopathy comes in for preop evaluation.  States that symptoms unchanged from previous visit.  She is wanting to proceed with RIGHT L3-4 TRANSFORAMINAL LUMBAR INTERBODY FUSION WITH RODS, SCREWS, CAGE, LOCAL AND ALLOGRAFT BONE GRAFT, VIVIGEN, RIGHT L2-3 LATERAL RECESS DECOMPRESSION as scheduled.  We received preop medical and cardiac clearance from Dr. Leandrew Koyanagi.  Today history and physical performed.  Review of systems positive for sleep apnea with CPAP use nightly.  Plan We will proceed with surgery as scheduled.  Patient states that she lives alone and has a son in the area that we will not be able to offer much assistance.  She is asking about going to a rehab postop.  Advised patient that we will have the social worker talk to her about this after surgery.  Also advised patient to bring her CPAP mask and tubing to the hospital.  All questions answered.

## 2021-01-26 NOTE — Progress Notes (Signed)
PCP - Quintin Alto MD Cardiologist - No  PPM/ICD - n/a  Chest x-ray - 01/26/21 in PAT EKG - 01/26/21 in PAT Stress Test - 12/28/20 @[facility]  ECHO - 04/01/15 Cardiac Cath - 04/28/15  Sleep Study - 05/30/20 CPAP - yes, pt will bring it DOS  Fasting Blood Sugar - does not check blood sugar  Blood Thinner Instructions: n/a Aspirin Instructions: n/a  ERAS Protcol - yes PRE-SURGERY Ensure or G2- G2  COVID TEST- will get covid test at drive thru on 14/5/21; given directions and requisition form  Anesthesia review: yes, cardiac hx, recent stress test  Patient denies shortness of breath, fever, cough and chest pain at PAT appointment   All instructions explained to the patient, with a verbal understanding of the material. Patient agrees to go over the instructions while at home for a better understanding. Patient also instructed to self quarantine after being tested for COVID-19. The opportunity to ask questions was provided.

## 2021-01-26 NOTE — Pre-Procedure Instructions (Addendum)
Surgical Instructions    Your procedure is scheduled on Monday, August 8th, 2022.  Report to [facility] Main Entrance "A" at 05:30 A.M., then check in with the Admitting office.  Call this number if you have problems the morning of surgery:  [phone]   If you have any questions prior to your surgery date call [phone]: Open Monday-Friday 8am-4pm    Remember:  Do not eat after midnight the night before your surgery  You may drink clear liquids until 04:30 A.M. the morning of your surgery.   Clear liquids allowed are: Water, Non-Citrus Juices (without pulp), Carbonated Beverages, Clear Tea, Black Coffee Only, and Gatorade   The day of surgery (if you have diabetes): Drink ONE (1) 12 oz G2 given to you in your pre admission testing appointment by 04:30 A.M. the morning of surgery. Drink in one sitting. Do not sip.  This drink was given to you during your hospital  pre-op appointment visit.  Nothing else to drink after completing the  12 oz bottle of G2.         If you have questions, please contact your surgeon's office.     Take these medicines the morning of surgery with A SIP OF WATER  amLODipine (NORVASC) atorvastatin (LIPITOR) gabapentin (NEURONTIN)   If needed:   acetaminophen (TYLENOL) famotidine (PEPCID)    Follow your surgeon's instructions on when to stop Aspirin.  If no instructions were given by your surgeon then you will need to call the office to get those instructions.     As of today, STOP taking any Aspirin (unless otherwise instructed by your surgeon) Aleve, Naproxen, Ibuprofen, Motrin, Advil, Goody's, BC's, all herbal medications, fish oil, and all vitamins.  WHAT DO I DO ABOUT MY DIABETES MEDICATION?     Do not take oral metFORMIN (GLUCOPHAGE) the morning of surgery.     HOW TO MANAGE YOUR DIABETES BEFORE AND AFTER SURGERY   Why is it important to control my blood sugar before and after surgery? Improving blood sugar levels before and after  surgery helps healing and can limit problems. A way of improving blood sugar control is eating a healthy diet by:  Eating less sugar and carbohydrates  Increasing activity/exercise  Talking with your doctor about reaching your blood sugar goals High blood sugars (greater than 180 mg/dL) can raise your risk of infections and slow your recovery, so you will need to focus on controlling your diabetes during the weeks before surgery. Make sure that the doctor who takes care of your diabetes knows about your planned surgery including the date and location.   How do I manage my blood sugar before surgery? Check your blood sugar at least 4 times a day, starting 2 days before surgery, to make sure that the level is not too high or low.   Check your blood sugar the morning of your surgery when you wake up and every 2 hours until you get to the Short Stay unit.   If your blood sugar is less than 70 mg/dL, you will need to treat for low blood sugar: Do not take insulin. Treat a low blood sugar (less than 70 mg/dL) with  cup of clear juice (cranberry or apple), 4 glucose tablets, OR glucose gel. Recheck blood sugar in 15 minutes after treatment (to make sure it is greater than 70 mg/dL). If your blood sugar is not greater than 70 mg/dL on recheck, call [phone] for further instructions. Report your blood sugar to the short  stay nurse when you get to Short Stay.   If you are admitted to the hospital after surgery: Your blood sugar will be checked by the staff and you will probably be given insulin after surgery (instead of oral diabetes medicines) to make sure you have good blood sugar levels. The goal for blood sugar control after surgery is 80-180 mg/dL.           Do not wear jewelry or makeup Do not wear lotions, powders, perfumes/colognes, or deodorant. Do not shave 48 hours prior to surgery.  Men may shave face and neck. Do not bring valuables to the hospital. DO Not wear nail polish, gel  polish, artificial nails, or any other type of covering on  natural nails including finger and toenails. If patients have artificial nails, gel coating, etc. that need to be removed by a nail salon please have this removed prior to surgery or surgery may need to be canceled/delayed if the surgeon/ anesthesia feels like the patient is unable to be adequately monitored.             [facility] is not responsible for any belongings or valuables.  Do NOT Smoke (Tobacco/Vaping) or drink Alcohol 24 hours prior to your procedure If you use a CPAP at night, you may bring all equipment for your overnight stay.   Contacts, glasses, dentures or bridgework may not be worn into surgery, please bring cases for these belongings   For patients admitted to the hospital, discharge time will be determined by your treatment team.   Patients discharged the day of surgery will not be allowed to drive home, and someone needs to stay with them for 24 hours.  ONLY 1 SUPPORT PERSON MAY BE PRESENT WHILE YOU ARE IN SURGERY. IF YOU ARE TO BE ADMITTED ONCE YOU ARE IN YOUR ROOM YOU WILL BE ALLOWED TWO (2) VISITORS.  Minor children may have two parents present. Special consideration for safety and communication needs will be reviewed on a case by case basis.  Special instructions:    Oral Hygiene is also important to reduce your risk of infection.  Remember - BRUSH YOUR TEETH THE MORNING OF SURGERY WITH YOUR REGULAR TOOTHPASTE   [facility]- Preparing For Surgery  Before surgery, you can play an important role. Because skin is not sterile, your skin needs to be as free of germs as possible. You can reduce the number of germs on your skin by washing with CHG (chlorahexidine gluconate) Soap before surgery.  CHG is an antiseptic cleaner which kills germs and bonds with the skin to continue killing germs even after washing.     Please do not use if you have an allergy to CHG or antibacterial soaps. If your skin becomes  reddened/irritated stop using the CHG.  Do not shave (including legs and underarms) for at least 48 hours prior to first CHG shower. It is OK to shave your face.  Please follow these instructions carefully.     Shower the NIGHT BEFORE SURGERY and the MORNING OF SURGERY with CHG Soap.   If you chose to wash your hair, wash your hair first as usual with your normal shampoo. After you shampoo, rinse your hair and body thoroughly to remove the shampoo.  Then [employer] and genitals (private parts) with your normal soap and rinse thoroughly to remove soap.  After that Use CHG Soap as you would any other liquid soap. You can apply CHG directly to the skin and wash gently with  a scrungie or a clean washcloth.   Apply the CHG Soap to your body ONLY FROM THE NECK DOWN.  Do not use on open wounds or open sores. Avoid contact with your eyes, ears, mouth and genitals (private parts). Wash Face and genitals (private parts)  with your normal soap.   Wash thoroughly, paying special attention to the area where your surgery will be performed.  Thoroughly rinse your body with warm water from the neck down.  DO NOT shower/wash with your normal soap after using and rinsing off the CHG Soap.  Pat yourself dry with a CLEAN TOWEL.  Wear CLEAN PAJAMAS to bed the night before surgery  Place CLEAN SHEETS on your bed the night before your surgery  DO NOT SLEEP WITH PETS.   Day of Surgery:  Take a shower with CHG soap. Wear Clean/Comfortable clothing the morning of surgery Do not apply any deodorants/lotions.   Remember to brush your teeth WITH YOUR REGULAR TOOTHPASTE.   Please read over the following fact sheets that you were given.

## 2021-01-27 NOTE — Progress Notes (Signed)
Anesthesia Chart Review:  Case: [id number] Date/Time: 01/31/21 0715   Procedure: RIGHT L3-4 TRANSFORAMINAL LUMBAR INTERBODY FUSION WITH RODS, SCREWS, CAGE, LOCAL AND ALLOGRAFT BONE GRAFT, VIVIGEN, RIGHT L2-3 LATERAL RECESS DECOMPRESSION   Anesthesia type: General   Pre-op diagnosis: lumbar spondylolisthesis L3-4 above L4 to S1 fusion, right L2-3 lateral recess stenosis   [facility] ROOM 05 / [facility] OR   Surgeons: Kerrin Champagne, MD       DISCUSSION: Patient is a 70 year old [name] [name] scheduled for the above procedure.  History includes never smoker, CAD (NSTEMI, s/p cutting balloon atherectomy/DES [facility] 01/01/03), HLD, HTN, GERD, pulmonary nodules (stable small right lung nodlues, no further f/u 02/11/08 CT), DM2, CKD (stage 2), OSA (uses CPAP), spinal surgery (ACDF; L4-5 fusion 01/08/04; L5-S1 fusion 06/22/09).  BMI is consistent with obesity.  Surgical clearance per Juliette Alcide, MD after he ordered a preoperative stress test done on 12/28/20 and was non-ischemic, EF 65%.  She denies shortness of breath, cough, fever, chest pain at PAT RN visit.  She is for preoperative COVID-19 testing on 01/28/2021.  Anesthesia team to evaluate on the day of surgery.   VS: BP (!) 153/83   Pulse 78   Temp 36.9 C (Oral)   Resp 17   Ht 5\' 7"  (1.702 m)   Wt 105.3 kg   SpO2 98%   BMI 36.37 kg/m    PROVIDERS: Burdine, , MD is PCP ([facility] in [place]) - She is not currently followed by a cardiologist. Last visit seen was on 06/07/15 with 14/12/16, MD. 12/28/20 stress test was ordered through her PCP.   LABS: Labs reviewed: Acceptable for surgery. (all labs ordered are listed, but only abnormal results are displayed)  Labs Reviewed  COMPREHENSIVE METABOLIC PANEL - Abnormal; Notable for the following components:      Result Value   Glucose, Bld 104 (*)    All other components within normal limits  HEMOGLOBIN A1C - Abnormal; Notable for the following components:   Hgb A1c  MFr Bld 6.1 (*)    All other components within normal limits  SURGICAL PCR SCREEN  GLUCOSE, CAPILLARY  CBC  PROTIME-INR  TYPE AND SCREEN    Sleep Study 05/30/20: IMPRESSIONS -  This recording shows severe obstructive sleep apnea syndrome. AutoPAP 8-14 is recommended.   IMAGES: CXR 01/26/21: FINDINGS: Mediastinum hilar structures normal. Heart size stable. No focal infiltrate. No pleural effusion or pneumothorax. Mild thoracic spine scoliosis and degenerative change again noted. IMPRESSION: No acute cardiopulmonary disease.   MRI L-spine 11/01/20: IMPRESSION: 1. L3-4 marked adjacent segment degeneration with advanced spinal and biforaminal stenosis, new from 2008. 2. L1-2 and L2-3 facet osteoarthritis asymmetric to the right. Spurring on the right at L2-3 impinges on the descending right L3 nerve root. 3. Solid arthrodesis at L4-5 and L5-S1. No recurrent impingement at the postoperative levels.     EKG: 01/26/21: Normal sinus rhythm Cannot rule out Anterior infarct , age undetermined Nonspecific lateral T wave inversion No significant change since last tracing Confirmed by Croitoru, Mihai [phone]) on 01/27/2021 8:05:10 AM   CV: Nuclear stress test 12/28/20 [facility]): Impression: 1. No reversible ischemia or infarction.  2. Normal left ventricular wall motion.  3. Left ventricular ejection fraction 65%  4. Non invasive risk stratification*: Low  *2012 Appropriate Use Criteria for Coronary Revascularization  Focused Update: J Am Coll Cardiol. 2012;59(9):857-881.  http://[email or web address].aspx?articleid=[email or web address]     Cardiac cath 04/28/15: Conclusion 1. Widely patent RCA stent with no significant obstruction  in that vessel. 2. Widely patent left main, LAD, and left circumflex 3. Normal LV function by echo 4. Normal LVEDP Suspect noncardiac symptoms.   Echo 04/01/15: Impressions:  - Mild LVH with LVEF 65-70% and grade 1 diastolic dysfunction. Mild    left  atrial enlargement. Heavily calcified mitral annulus with    trivial mitral regurgitation. Mildly sclerotic aortic valve.    Trivial tricuspid regurgitation with PASP 25 mmHg.   Past Medical History:  Diagnosis Date   Arthritis    Carpal tunnel syndrome    Chronic kidney disease (CKD), stage II (mild)    Constipation    Coronary artery disease    NSTEMI 2004, DES to ostial RCA   DDD (degenerative disc disease)    Essential hypertension    GERD (gastroesophageal reflux disease)    Hyperlipidemia    IBS (irritable bowel syndrome)    Low back pain    Myocardial infarction (HCC) 2004   Postmenopausal    Pulmonary nodule    Sleep apnea    "waiting on machine to come".   Type 2 diabetes mellitus [facility])     Past Surgical History:  Procedure Laterality Date   ABDOMINAL HYSTERECTOMY     CARDIAC CATHETERIZATION N/A 04/28/2015   Procedure: Left Heart Cath and Coronary Angiography;  Surgeon: Tonny Bollman, MD;  Location: [facility] INVASIVE CV LAB;  Service: Cardiovascular;  Laterality: N/A;   CATARACT EXTRACTION W/PHACO Right 07/19/2020   Procedure: CATARACT EXTRACTION PHACO AND INTRAOCULAR LENS PLACEMENT RIGHT EYE;  Surgeon: Fabio Pierce, MD;  Location: [facility] ORS;  Service: Ophthalmology;  Laterality: Right;  right CDE=12.56   CATARACT EXTRACTION W/PHACO Left 08/09/2020   Procedure: CATARACT EXTRACTION PHACO AND INTRAOCULAR LENS PLACEMENT (IOC);  Surgeon: Fabio Pierce, MD;  Location: [facility] ORS;  Service: Ophthalmology;  Laterality: Left;  CDE: 6.66   Cervical disectomy and fusion     Cholestectomy     COLONOSCOPY N/A 05/07/2019   Procedure: COLONOSCOPY;  Surgeon: Corbin Ade, MD;  Location: [facility] ENDO SUITE;  Service: Endoscopy;  Laterality: N/A;  12:00   Excision of ovarian cyst     L4-5 with pedicle screws and rods     Right shoulder RTC      MEDICATIONS:  acetaminophen (TYLENOL) 500 MG tablet   amLODipine (NORVASC) 10 MG tablet   aspirin 81 MG tablet   atorvastatin (LIPITOR) 40 MG  tablet   Cholecalciferol (VITAMIN D3) 2000 UNITS TABS   diclofenac Sodium (VOLTAREN) 1 % GEL   diphenhydramine-acetaminophen (TYLENOL PM) 25-500 MG TABS tablet   famotidine (PEPCID) 20 MG tablet   gabapentin (NEURONTIN) 300 MG capsule   hydrochlorothiazide (HYDRODIURIL) 25 MG tablet   lisinopril (PRINIVIL,ZESTRIL) 40 MG tablet   meloxicam (MOBIC) 15 MG tablet   metFORMIN (GLUCOPHAGE) 500 MG tablet   No current facility-administered medications for this encounter.    Shonna Chock, PA-C Surgical Short Stay/Anesthesiology [facility] Phone [phone] [facility] Phone [phone] 01/27/2021 10:16 AM

## 2021-01-27 NOTE — Anesthesia Preprocedure Evaluation (Addendum)
Anesthesia Evaluation  Patient identified by MRN, date of birth, ID band Patient awake    Reviewed: Allergy & Precautions, NPO status , Patient's Chart, lab work & pertinent test results  Airway Mallampati: I  TM Distance: >3 FB Neck ROM: Full    Dental  (+) Edentulous Upper, Edentulous Lower, Dental Advisory Given   Pulmonary sleep apnea ,    breath sounds clear to auscultation       Cardiovascular hypertension, Pt. on medications + CAD   Rhythm:Regular Rate:Normal  Nuclear stress test 12/28/20 [facility]): Impression: 1. No reversible ischemia or infarction.  2. Normal left ventricular wall motion.  3. Left ventricular ejection fraction 65%  4. Non invasive risk stratification: Low  Echo: - Left ventricle: The cavity size was normal. Wall thickness was  increased in a pattern of mild LVH. Systolic function was  vigorous. The estimated ejection fraction was in the range of 65%  to 70%. Wall motion was normal; there were no regional wall  motion abnormalities. Doppler parameters are consistent with  abnormal left ventricular relaxation (grade 1 diastolic  dysfunction).  - Aortic valve: Trileaflet; mildly calcified leaflets. Left  coronary cusp mobility was mildly restricted.  - Mitral valve: Heavily calcified annulus. There was trivial  regurgitation.  - Left atrium: The atrium was mildly dilated.  - Right atrium: Central venous pressure (est): 3 mm Hg.  - Atrial septum: No defect or patent foramen ovale was identified.  - Tricuspid valve: There was trivial regurgitation.  - Pulmonary arteries: PA peak pressure: 25 mm Hg (S).  - Pericardium, extracardiac: There was no pericardial effusion.    Neuro/Psych PSYCHIATRIC DISORDERS Depression    GI/Hepatic Neg liver ROS, GERD  Medicated,  Endo/Other  diabetes, Type 2, Oral Hypoglycemic Agents  Renal/GU CRFRenal disease     Musculoskeletal  (+) Arthritis ,    Abdominal Normal abdominal exam  (+)   Peds  Hematology negative hematology ROS (+)   Anesthesia Other Findings   Reproductive/Obstetrics                          Anesthesia Physical Anesthesia Plan  ASA: 3  Anesthesia Plan: General   Post-op Pain Management:    Induction: Intravenous  PONV Risk Score and Plan: 4 or greater and Ondansetron, Dexamethasone, Midazolam and Treatment may vary due to age or medical condition  Airway Management Planned: Oral ETT  Additional Equipment: None  Intra-op Plan:   Post-operative Plan: Extubation in OR  Informed Consent: I have reviewed the patients History and Physical, chart, labs and discussed the procedure including the risks, benefits and alternatives for the proposed anesthesia with the patient or authorized representative who has indicated his/her understanding and acceptance.     Dental advisory given  Plan Discussed with: CRNA  Anesthesia Plan Comments: (PAT note written 01/27/2021 by Shonna Chock, PA-C.  - 2 IV"s - Possible intraop Arterial Line )     Anesthesia Quick Evaluation

## 2021-01-28 ENCOUNTER — Other Ambulatory Visit: Payer: Self-pay | Admitting: Specialist

## 2021-01-28 NOTE — H&P (Signed)
[name] [name] is an 70 y.o. female.   Chief Complaint: back pain and LE radiculopathy HPI: 70 year old female with history of L3-4 HNP/stenosis, right greater than left lower extremity radiculopathy comes in for preop evaluation.  States that symptoms unchanged from previous visit.  She is wanting to proceed with RIGHT L3-4 TRANSFORAMINAL LUMBAR INTERBODY FUSION WITH RODS, SCREWS, CAGE, LOCAL AND ALLOGRAFT BONE GRAFT, VIVIGEN, RIGHT L2-3 LATERAL RECESS DECOMPRESSION as scheduled.  We received preop medical and cardiac clearance from Dr. Leandrew Koyanagi.  Today history and physical performed.  Review of systems positive for sleep apnea with CPAP use nightly.  Past Medical History:  Diagnosis Date   Arthritis    Carpal tunnel syndrome    Chronic kidney disease (CKD), stage II (mild)    Constipation    Coronary artery disease    NSTEMI 2004, DES to ostial RCA   DDD (degenerative disc disease)    Essential hypertension    GERD (gastroesophageal reflux disease)    Hyperlipidemia    IBS (irritable bowel syndrome)    Low back pain    Myocardial infarction (HCC) 2004   Postmenopausal    Pulmonary nodule    Sleep apnea    "waiting on machine to come".   Type 2 diabetes mellitus [facility])     Past Surgical History:  Procedure Laterality Date   ABDOMINAL HYSTERECTOMY     CARDIAC CATHETERIZATION N/A 04/28/2015   Procedure: Left Heart Cath and Coronary Angiography;  Surgeon: Tonny Bollman, MD;  Location: [facility] INVASIVE CV LAB;  Service: Cardiovascular;  Laterality: N/A;   CATARACT EXTRACTION W/PHACO Right 07/19/2020   Procedure: CATARACT EXTRACTION PHACO AND INTRAOCULAR LENS PLACEMENT RIGHT EYE;  Surgeon: Fabio Pierce, MD;  Location: [facility] ORS;  Service: Ophthalmology;  Laterality: Right;  right CDE=12.56   CATARACT EXTRACTION W/PHACO Left 08/09/2020   Procedure: CATARACT EXTRACTION PHACO AND INTRAOCULAR LENS PLACEMENT (IOC);  Surgeon: Fabio Pierce, MD;  Location: [facility] ORS;  Service: Ophthalmology;   Laterality: Left;  CDE: 6.66   Cervical disectomy and fusion     Cholestectomy     COLONOSCOPY N/A 05/07/2019   Procedure: COLONOSCOPY;  Surgeon: Corbin Ade, MD;  Location: [facility] ENDO SUITE;  Service: Endoscopy;  Laterality: N/A;  12:00   Excision of ovarian cyst     L4-5 with pedicle screws and rods     Right shoulder RTC      Family History  Problem Relation Age of Onset   Stroke Mother    Diabetes Mellitus II Mother    Kidney disease Father    Hypertension Brother    Diabetes Mellitus II Brother    Diabetes Mellitus II Sister    Social History:  reports that she has never smoked. She has never used smokeless tobacco. She reports that she does not drink alcohol and does not use drugs.  Allergies: No Known Allergies  No medications prior to admission.    No results found for this or any previous visit (from the past 48 hour(s)). No results found.  Review of Systems  Constitutional:  Positive for activity change.  HENT: Negative.    Respiratory:  Positive for apnea.   Cardiovascular: Negative.   Genitourinary: Negative.   Musculoskeletal:  Positive for back pain and gait problem.  Neurological:  Positive for numbness.  Psychiatric/Behavioral: Negative.     There were no vitals taken for this visit. Physical Exam HENT:     Head: Normocephalic and atraumatic.  Eyes:     Extraocular Movements: Extraocular movements  intact.  Pulmonary:     Breath sounds: Normal breath sounds.  Musculoskeletal:        General: Tenderness present.  Neurological:     General: No focal deficit present.     Mental Status: She is alert.  Psychiatric:        Mood and Affect: Mood normal.     Assessment/Plan L3-4 HNP/stenosis  We will proceed with RIGHT L3-4 TRANSFORAMINAL LUMBAR INTERBODY FUSION WITH RODS, SCREWS, CAGE, LOCAL AND ALLOGRAFT BONE GRAFT, VIVIGEN, RIGHT L2-3 LATERAL RECESS DECOMPRESSION as scheduled.  Surgical procedure discussed and all questions answered.   Zonia Kief, PA-C 01/28/2021, 3:20 PM

## 2021-01-29 LAB — SARS CORONAVIRUS 2 (TAT 6-24 HRS): SARS Coronavirus 2: NEGATIVE

## 2021-01-31 ENCOUNTER — Ambulatory Visit (HOSPITAL_COMMUNITY): Payer: Medicare HMO | Admitting: Anesthesiology

## 2021-01-31 ENCOUNTER — Ambulatory Visit (HOSPITAL_COMMUNITY): Payer: Medicare HMO

## 2021-01-31 ENCOUNTER — Encounter (HOSPITAL_COMMUNITY)
Admission: RE | Disposition: A | Discharge status: Home / Self Care | Payer: Self-pay | Source: Home / Self Care | Attending: Specialist

## 2021-01-31 ENCOUNTER — Encounter (HOSPITAL_COMMUNITY): Payer: Self-pay | Admitting: Specialist

## 2021-01-31 ENCOUNTER — Observation Stay (HOSPITAL_COMMUNITY)
Admission: RE | Admit: 2021-01-31 | Discharge: 2021-02-08 | Disposition: A | Discharge status: Home / Self Care | Payer: Medicare HMO | Attending: Specialist | Admitting: Specialist

## 2021-01-31 ENCOUNTER — Other Ambulatory Visit: Payer: Self-pay

## 2021-01-31 ENCOUNTER — Ambulatory Visit (HOSPITAL_COMMUNITY): Payer: Medicare HMO | Admitting: Vascular Surgery

## 2021-01-31 DIAGNOSIS — M4316 Spondylolisthesis, lumbar region: Secondary | ICD-10-CM

## 2021-01-31 DIAGNOSIS — D62 Acute posthemorrhagic anemia: Secondary | ICD-10-CM | POA: Diagnosis not present

## 2021-01-31 DIAGNOSIS — M4326 Fusion of spine, lumbar region: Secondary | ICD-10-CM | POA: Diagnosis not present

## 2021-01-31 DIAGNOSIS — E1122 Type 2 diabetes mellitus with diabetic chronic kidney disease: Secondary | ICD-10-CM | POA: Diagnosis not present

## 2021-01-31 DIAGNOSIS — N183 Chronic kidney disease, stage 3 unspecified: Secondary | ICD-10-CM | POA: Diagnosis not present

## 2021-01-31 DIAGNOSIS — I129 Hypertensive chronic kidney disease with stage 1 through stage 4 chronic kidney disease, or unspecified chronic kidney disease: Secondary | ICD-10-CM | POA: Insufficient documentation

## 2021-01-31 DIAGNOSIS — M4317 Spondylolisthesis, lumbosacral region: Secondary | ICD-10-CM | POA: Diagnosis not present

## 2021-01-31 DIAGNOSIS — Z20822 Contact with and (suspected) exposure to covid-19: Secondary | ICD-10-CM | POA: Diagnosis not present

## 2021-01-31 DIAGNOSIS — M4726 Other spondylosis with radiculopathy, lumbar region: Secondary | ICD-10-CM

## 2021-01-31 DIAGNOSIS — Z7984 Long term (current) use of oral hypoglycemic drugs: Secondary | ICD-10-CM | POA: Diagnosis not present

## 2021-01-31 DIAGNOSIS — N182 Chronic kidney disease, stage 2 (mild): Secondary | ICD-10-CM | POA: Diagnosis not present

## 2021-01-31 DIAGNOSIS — M4807 Spinal stenosis, lumbosacral region: Secondary | ICD-10-CM | POA: Diagnosis not present

## 2021-01-31 DIAGNOSIS — M48062 Spinal stenosis, lumbar region with neurogenic claudication: Secondary | ICD-10-CM | POA: Diagnosis not present

## 2021-01-31 DIAGNOSIS — I2510 Atherosclerotic heart disease of native coronary artery without angina pectoris: Secondary | ICD-10-CM | POA: Diagnosis not present

## 2021-01-31 DIAGNOSIS — Z419 Encounter for procedure for purposes other than remedying health state, unspecified: Secondary | ICD-10-CM

## 2021-01-31 DIAGNOSIS — M48061 Spinal stenosis, lumbar region without neurogenic claudication: Secondary | ICD-10-CM | POA: Diagnosis not present

## 2021-01-31 LAB — URINALYSIS, ROUTINE W REFLEX MICROSCOPIC
Bilirubin Urine: NEGATIVE
Glucose, UA: NEGATIVE mg/dL
Hgb urine dipstick: NEGATIVE
Ketones, ur: NEGATIVE mg/dL
Leukocytes,Ua: NEGATIVE
Nitrite: NEGATIVE
Protein, ur: NEGATIVE mg/dL
Specific Gravity, Urine: 1.009 (ref 1.005–1.030)
pH: 5 (ref 5.0–8.0)

## 2021-01-31 LAB — GLUCOSE, CAPILLARY
Glucose-Capillary: 109 mg/dL — ABNORMAL HIGH (ref 70–99)
Glucose-Capillary: 152 mg/dL — ABNORMAL HIGH (ref 70–99)
Glucose-Capillary: 186 mg/dL — ABNORMAL HIGH (ref 70–99)
Glucose-Capillary: 175 mg/dL — ABNORMAL HIGH (ref 70–99)

## 2021-01-31 SURGERY — POSTERIOR LUMBAR FUSION 1 LEVEL
Anesthesia: General

## 2021-01-31 MED ORDER — BUPIVACAINE LIPOSOME 1.3 % IJ SUSP
INTRAMUSCULAR | Status: DC | PRN
  Administered 2021-01-31: 20 mL

## 2021-01-31 MED ORDER — PHENYLEPHRINE 40 MCG/ML (10ML) SYRINGE FOR IV PUSH (FOR BLOOD PRESSURE SUPPORT)
PREFILLED_SYRINGE | INTRAVENOUS | Status: DC | PRN
  Administered 2021-01-31: 80 ug via INTRAVENOUS
  Administered 2021-01-31: 40 ug via INTRAVENOUS
  Administered 2021-01-31: 80 ug via INTRAVENOUS
  Administered 2021-01-31: 40 ug via INTRAVENOUS
  Administered 2021-01-31: 80 ug via INTRAVENOUS

## 2021-01-31 MED ORDER — INSULIN ASPART 100 UNIT/ML IJ SOLN
INTRAMUSCULAR | Status: AC
  Filled 2021-01-31: qty 1

## 2021-01-31 MED ORDER — BUPIVACAINE HCL (PF) 0.5 % IJ SOLN
INTRAMUSCULAR | Status: AC
  Filled 2021-01-31: qty 10

## 2021-01-31 MED ORDER — INSULIN ASPART 100 UNIT/ML IJ SOLN
0.0000 [IU] | Freq: Three times a day (TID) | INTRAMUSCULAR | Status: DC
  Administered 2021-01-31 – 2021-02-01 (×2): 3 [IU] via SUBCUTANEOUS
  Administered 2021-02-01 – 2021-02-04 (×3): 2 [IU] via SUBCUTANEOUS

## 2021-01-31 MED ORDER — BUPIVACAINE HCL (PF) 0.5 % IJ SOLN
INTRAMUSCULAR | Status: AC
  Filled 2021-01-31: qty 30

## 2021-01-31 MED ORDER — SCOPOLAMINE 1 MG/3DAYS TD PT72
MEDICATED_PATCH | TRANSDERMAL | Status: AC
  Filled 2021-01-31: qty 1

## 2021-01-31 MED ORDER — THROMBIN 20000 UNITS EX SOLR
CUTANEOUS | Status: AC
  Filled 2021-01-31: qty 20000

## 2021-01-31 MED ORDER — MIDAZOLAM HCL 2 MG/2ML IJ SOLN
INTRAMUSCULAR | Status: DC | PRN
  Administered 2021-01-31: 2 mg via INTRAVENOUS

## 2021-01-31 MED ORDER — CEFAZOLIN SODIUM 1 G IJ SOLR
INTRAMUSCULAR | Status: AC
  Filled 2021-01-31: qty 20

## 2021-01-31 MED ORDER — DEXAMETHASONE SODIUM PHOSPHATE 10 MG/ML IJ SOLN
INTRAMUSCULAR | Status: DC | PRN
  Administered 2021-01-31: 10 mg via INTRAVENOUS

## 2021-01-31 MED ORDER — ONDANSETRON HCL 4 MG PO TABS: 4.0000 mg | ORAL_TABLET | Freq: Four times a day (QID) | ORAL | Status: DC | PRN

## 2021-01-31 MED ORDER — BUPIVACAINE LIPOSOME 1.3 % IJ SUSP
INTRAMUSCULAR | Status: AC
  Filled 2021-01-31: qty 20

## 2021-01-31 MED ORDER — BISACODYL 5 MG PO TBEC
5.0000 mg | DELAYED_RELEASE_TABLET | Freq: Every day | ORAL | Status: DC | PRN
  Administered 2021-02-03 – 2021-02-04 (×2): 5 mg via ORAL
  Filled 2021-01-31 (×2): qty 1

## 2021-01-31 MED ORDER — PROPOFOL 10 MG/ML IV BOLUS
INTRAVENOUS | Status: AC
  Filled 2021-01-31: qty 20

## 2021-01-31 MED ORDER — LACTATED RINGERS IV SOLN: INTRAVENOUS | Status: DC

## 2021-01-31 MED ORDER — METHOCARBAMOL 1000 MG/10ML IJ SOLN
500.0000 mg | Freq: Four times a day (QID) | INTRAVENOUS | Status: DC | PRN
  Filled 2021-01-31: qty 5

## 2021-01-31 MED ORDER — INSULIN ASPART 100 UNIT/ML IJ SOLN
3.0000 [IU] | Freq: Once | INTRAMUSCULAR | Status: AC
  Administered 2021-01-31: 3 [IU] via SUBCUTANEOUS

## 2021-01-31 MED ORDER — SODIUM CHLORIDE 0.9% FLUSH
3.0000 mL | Freq: Two times a day (BID) | INTRAVENOUS | Status: DC
  Administered 2021-01-31 – 2021-02-01 (×3): 3 mL via INTRAVENOUS

## 2021-01-31 MED ORDER — LISINOPRIL 20 MG PO TABS
40.0000 mg | ORAL_TABLET | Freq: Once | ORAL | Status: AC
  Administered 2021-01-31: 40 mg via ORAL
  Filled 2021-01-31: qty 2

## 2021-01-31 MED ORDER — LIDOCAINE 2% (20 MG/ML) 5 ML SYRINGE
INTRAMUSCULAR | Status: AC
  Filled 2021-01-31: qty 5

## 2021-01-31 MED ORDER — ACETAMINOPHEN 650 MG RE SUPP: 650.0000 mg | RECTAL | Status: DC | PRN

## 2021-01-31 MED ORDER — ALBUMIN HUMAN 5 % IV SOLN
INTRAVENOUS | Status: AC
  Filled 2021-01-31: qty 250

## 2021-01-31 MED ORDER — MIDAZOLAM HCL 2 MG/2ML IJ SOLN
INTRAMUSCULAR | Status: AC
  Filled 2021-01-31: qty 2

## 2021-01-31 MED ORDER — PROMETHAZINE HCL 25 MG/ML IJ SOLN: 6.2500 mg | INTRAMUSCULAR | Status: DC | PRN

## 2021-01-31 MED ORDER — ALBUMIN HUMAN 5 % IV SOLN: INTRAVENOUS | Status: DC | PRN

## 2021-01-31 MED ORDER — MEPERIDINE HCL 25 MG/ML IJ SOLN: 6.2500 mg | INTRAMUSCULAR | Status: DC | PRN

## 2021-01-31 MED ORDER — ACETAMINOPHEN 325 MG PO TABS: 325.0000 mg | ORAL_TABLET | Freq: Once | ORAL | Status: DC | PRN

## 2021-01-31 MED ORDER — PHENOL 1.4 % MT LIQD: 1.0000 | OROMUCOSAL | Status: DC | PRN

## 2021-01-31 MED ORDER — INSULIN ASPART 100 UNIT/ML IJ SOLN
4.0000 [IU] | Freq: Three times a day (TID) | INTRAMUSCULAR | Status: DC
  Administered 2021-01-31 – 2021-02-05 (×12): 4 [IU] via SUBCUTANEOUS

## 2021-01-31 MED ORDER — PANTOPRAZOLE SODIUM 40 MG PO TBEC
40.0000 mg | DELAYED_RELEASE_TABLET | Freq: Every day | ORAL | Status: DC
  Administered 2021-01-31 – 2021-02-08 (×9): 40 mg via ORAL
  Filled 2021-01-31 (×8): qty 1

## 2021-01-31 MED ORDER — FENTANYL CITRATE (PF) 250 MCG/5ML IJ SOLN
INTRAMUSCULAR | Status: AC
  Filled 2021-01-31: qty 5

## 2021-01-31 MED ORDER — AMISULPRIDE (ANTIEMETIC) 5 MG/2ML IV SOLN: 10.0000 mg | Freq: Once | INTRAVENOUS | Status: DC | PRN

## 2021-01-31 MED ORDER — HYDROCHLOROTHIAZIDE 25 MG PO TABS
25.0000 mg | ORAL_TABLET | Freq: Once | ORAL | Status: AC
  Administered 2021-01-31: 25 mg via ORAL
  Filled 2021-01-31: qty 1

## 2021-01-31 MED ORDER — SUGAMMADEX SODIUM 200 MG/2ML IV SOLN
INTRAVENOUS | Status: DC | PRN
Start: 2021-01-31 — End: 2021-01-31
  Administered 2021-01-31: 200 mg via INTRAVENOUS

## 2021-01-31 MED ORDER — KETOROLAC TROMETHAMINE 15 MG/ML IJ SOLN
7.5000 mg | Freq: Four times a day (QID) | INTRAMUSCULAR | Status: AC
  Administered 2021-01-31 – 2021-02-01 (×4): 7.5 mg via INTRAVENOUS
  Filled 2021-01-31 (×4): qty 1

## 2021-01-31 MED ORDER — ASPIRIN EC 81 MG PO TBEC
81.0000 mg | DELAYED_RELEASE_TABLET | Freq: Every day | ORAL | Status: DC
  Administered 2021-02-01 – 2021-02-08 (×8): 81 mg via ORAL
  Filled 2021-01-31 (×8): qty 1

## 2021-01-31 MED ORDER — HYDROMORPHONE HCL 1 MG/ML IJ SOLN
INTRAMUSCULAR | Status: AC
  Filled 2021-01-31: qty 1

## 2021-01-31 MED ORDER — ROCURONIUM BROMIDE 10 MG/ML (PF) SYRINGE
PREFILLED_SYRINGE | INTRAVENOUS | Status: AC
  Filled 2021-01-31: qty 20

## 2021-01-31 MED ORDER — CHLORHEXIDINE GLUCONATE 0.12 % MT SOLN
15.0000 mL | Freq: Once | OROMUCOSAL | Status: AC
  Administered 2021-01-31: 15 mL via OROMUCOSAL
  Filled 2021-01-31: qty 15

## 2021-01-31 MED ORDER — ONDANSETRON HCL 4 MG/2ML IJ SOLN: 4.0000 mg | Freq: Four times a day (QID) | INTRAMUSCULAR | Status: DC | PRN

## 2021-01-31 MED ORDER — HYDROCHLOROTHIAZIDE 25 MG PO TABS
25.0000 mg | ORAL_TABLET | Freq: Every day | ORAL | Status: DC
  Administered 2021-02-01 – 2021-02-08 (×8): 25 mg via ORAL
  Filled 2021-01-31 (×8): qty 1

## 2021-01-31 MED ORDER — PHENYLEPHRINE 40 MCG/ML (10ML) SYRINGE FOR IV PUSH (FOR BLOOD PRESSURE SUPPORT)
PREFILLED_SYRINGE | INTRAVENOUS | Status: AC
  Filled 2021-01-31: qty 10

## 2021-01-31 MED ORDER — MORPHINE SULFATE (PF) 2 MG/ML IV SOLN: 1.0000 mg | INTRAVENOUS | Status: DC | PRN

## 2021-01-31 MED ORDER — DEXAMETHASONE SODIUM PHOSPHATE 10 MG/ML IJ SOLN
INTRAMUSCULAR | Status: AC
  Filled 2021-01-31: qty 1

## 2021-01-31 MED ORDER — SODIUM CHLORIDE 0.9 % IV SOLN: INTRAVENOUS | Status: DC

## 2021-01-31 MED ORDER — MENTHOL 3 MG MT LOZG: 1.0000 | LOZENGE | OROMUCOSAL | Status: DC | PRN

## 2021-01-31 MED ORDER — METHOCARBAMOL 500 MG PO TABS
500.0000 mg | ORAL_TABLET | Freq: Four times a day (QID) | ORAL | Status: DC | PRN
  Administered 2021-01-31 – 2021-02-03 (×6): 500 mg via ORAL
  Filled 2021-01-31 (×6): qty 1

## 2021-01-31 MED ORDER — HYDROCODONE-ACETAMINOPHEN 7.5-325 MG PO TABS
2.0000 | ORAL_TABLET | ORAL | Status: DC | PRN
  Administered 2021-01-31 – 2021-02-05 (×3): 2 via ORAL
  Filled 2021-01-31 (×4): qty 2

## 2021-01-31 MED ORDER — BUPIVACAINE-EPINEPHRINE (PF) 0.25% -1:200000 IJ SOLN
INTRAMUSCULAR | Status: AC
  Filled 2021-01-31: qty 30

## 2021-01-31 MED ORDER — METHYLPREDNISOLONE ACETATE 40 MG/ML IJ SUSP
INTRAMUSCULAR | Status: DC | PRN
  Administered 2021-01-31: 40 mg

## 2021-01-31 MED ORDER — TRANEXAMIC ACID-NACL 1000-0.7 MG/100ML-% IV SOLN
INTRAVENOUS | Status: AC
  Filled 2021-01-31: qty 100

## 2021-01-31 MED ORDER — ATORVASTATIN CALCIUM 40 MG PO TABS
40.0000 mg | ORAL_TABLET | Freq: Every day | ORAL | Status: DC
  Administered 2021-02-01 – 2021-02-08 (×8): 40 mg via ORAL
  Filled 2021-01-31 (×8): qty 1

## 2021-01-31 MED ORDER — DICLOFENAC SODIUM 1 % EX GEL
1.0000 "application " | Freq: Every day | CUTANEOUS | Status: DC
  Administered 2021-02-01 – 2021-02-08 (×8): 1 via TOPICAL
  Filled 2021-01-31: qty 100

## 2021-01-31 MED ORDER — BUPIVACAINE HCL 0.5 % IJ SOLN
INTRAMUSCULAR | Status: DC | PRN
  Administered 2021-01-31: 10 mL
  Administered 2021-01-31: 20 mL

## 2021-01-31 MED ORDER — ACETAMINOPHEN 10 MG/ML IV SOLN
INTRAVENOUS | Status: AC
  Filled 2021-01-31: qty 100

## 2021-01-31 MED ORDER — FAMOTIDINE 20 MG PO TABS: 20.0000 mg | ORAL_TABLET | Freq: Every day | ORAL | Status: DC | PRN

## 2021-01-31 MED ORDER — PROPOFOL 10 MG/ML IV BOLUS
INTRAVENOUS | Status: DC | PRN
  Administered 2021-01-31: 150 mg via INTRAVENOUS

## 2021-01-31 MED ORDER — ACETAMINOPHEN 160 MG/5ML PO SOLN: 325.0000 mg | Freq: Once | ORAL | Status: DC | PRN

## 2021-01-31 MED ORDER — ORAL CARE MOUTH RINSE: 15.0000 mL | Freq: Once | OROMUCOSAL | Status: AC

## 2021-01-31 MED ORDER — PANTOPRAZOLE SODIUM 40 MG IV SOLR
40.0000 mg | Freq: Every day | INTRAVENOUS | Status: DC
  Filled 2021-01-31: qty 40

## 2021-01-31 MED ORDER — THROMBIN 20000 UNITS EX SOLR: CUTANEOUS | Status: DC | PRN

## 2021-01-31 MED ORDER — HYDROCODONE-ACETAMINOPHEN 7.5-325 MG PO TABS: 1.0000 | ORAL_TABLET | ORAL | Status: DC | PRN

## 2021-01-31 MED ORDER — FLEET ENEMA 7-19 GM/118ML RE ENEM: 1.0000 | ENEMA | Freq: Once | RECTAL | Status: DC | PRN

## 2021-01-31 MED ORDER — LACTATED RINGERS IV SOLN: INTRAVENOUS | Status: DC | PRN

## 2021-01-31 MED ORDER — DOCUSATE SODIUM 100 MG PO CAPS
100.0000 mg | ORAL_CAPSULE | Freq: Two times a day (BID) | ORAL | Status: DC
  Administered 2021-01-31 – 2021-02-08 (×17): 100 mg via ORAL
  Filled 2021-01-31 (×17): qty 1

## 2021-01-31 MED ORDER — ACETAMINOPHEN 325 MG PO TABS: 650.0000 mg | ORAL_TABLET | ORAL | Status: DC | PRN

## 2021-01-31 MED ORDER — SODIUM CHLORIDE 0.9% FLUSH: 3.0000 mL | INTRAVENOUS | Status: DC | PRN

## 2021-01-31 MED ORDER — METHYLPREDNISOLONE ACETATE 40 MG/ML IJ SUSP
INTRAMUSCULAR | Status: AC
  Filled 2021-01-31: qty 1

## 2021-01-31 MED ORDER — LISINOPRIL 20 MG PO TABS
40.0000 mg | ORAL_TABLET | Freq: Every day | ORAL | Status: DC
  Administered 2021-02-01 – 2021-02-08 (×8): 40 mg via ORAL
  Filled 2021-01-31 (×8): qty 2

## 2021-01-31 MED ORDER — TRANEXAMIC ACID-NACL 1000-0.7 MG/100ML-% IV SOLN
INTRAVENOUS | Status: DC | PRN
  Administered 2021-01-31: 1000 mg via INTRAVENOUS

## 2021-01-31 MED ORDER — FENTANYL CITRATE (PF) 250 MCG/5ML IJ SOLN
INTRAMUSCULAR | Status: DC | PRN
  Administered 2021-01-31: 100 ug via INTRAVENOUS

## 2021-01-31 MED ORDER — CEFAZOLIN SODIUM-DEXTROSE 2-4 GM/100ML-% IV SOLN
2.0000 g | INTRAVENOUS | Status: AC
  Administered 2021-01-31 (×2): 2 g via INTRAVENOUS
  Filled 2021-01-31: qty 100

## 2021-01-31 MED ORDER — AMLODIPINE BESYLATE 10 MG PO TABS
10.0000 mg | ORAL_TABLET | Freq: Every day | ORAL | Status: DC
  Administered 2021-02-01 – 2021-02-08 (×8): 10 mg via ORAL
  Filled 2021-01-31: qty 2
  Filled 2021-01-31 (×2): qty 1
  Filled 2021-01-31: qty 2
  Filled 2021-01-31 (×3): qty 1
  Filled 2021-01-31: qty 2

## 2021-01-31 MED ORDER — BUPIVACAINE LIPOSOME 1.3 % IJ SUSP
10.0000 mL | Freq: Once | INTRAMUSCULAR | Status: DC
  Filled 2021-01-31: qty 10

## 2021-01-31 MED ORDER — CEFAZOLIN SODIUM-DEXTROSE 2-4 GM/100ML-% IV SOLN
2.0000 g | Freq: Three times a day (TID) | INTRAVENOUS | Status: AC
  Administered 2021-01-31 (×2): 2 g via INTRAVENOUS
  Filled 2021-01-31 (×2): qty 100

## 2021-01-31 MED ORDER — PHENYLEPHRINE HCL-NACL 20-0.9 MG/250ML-% IV SOLN
INTRAVENOUS | Status: DC | PRN
  Administered 2021-01-31: 25 ug/min via INTRAVENOUS

## 2021-01-31 MED ORDER — SODIUM CHLORIDE 0.9 % IV SOLN: 250.0000 mL | INTRAVENOUS | Status: DC

## 2021-01-31 MED ORDER — ACETAMINOPHEN 10 MG/ML IV SOLN
1000.0000 mg | Freq: Once | INTRAVENOUS | Status: DC | PRN
  Administered 2021-01-31: 1000 mg via INTRAVENOUS

## 2021-01-31 MED ORDER — HYDROMORPHONE HCL 1 MG/ML IJ SOLN
0.2500 mg | INTRAMUSCULAR | Status: DC | PRN
  Administered 2021-01-31: 0.5 mg via INTRAVENOUS

## 2021-01-31 MED ORDER — POLYETHYLENE GLYCOL 3350 17 G PO PACK: 17.0000 g | PACK | Freq: Every day | ORAL | Status: DC | PRN

## 2021-01-31 MED ORDER — ONDANSETRON HCL 4 MG/2ML IJ SOLN
INTRAMUSCULAR | Status: DC | PRN
  Administered 2021-01-31: 4 mg via INTRAVENOUS

## 2021-01-31 MED ORDER — ALUM & MAG HYDROXIDE-SIMETH 200-200-20 MG/5ML PO SUSP: 30.0000 mL | Freq: Four times a day (QID) | ORAL | Status: DC | PRN

## 2021-01-31 MED ORDER — GABAPENTIN 300 MG PO CAPS
300.0000 mg | ORAL_CAPSULE | Freq: Three times a day (TID) | ORAL | Status: DC
  Administered 2021-01-31 – 2021-02-08 (×23): 300 mg via ORAL
  Filled 2021-01-31 (×23): qty 1

## 2021-01-31 MED ORDER — VITAMIN D 25 MCG (1000 UNIT) PO TABS
2000.0000 [IU] | ORAL_TABLET | Freq: Every day | ORAL | Status: DC
  Administered 2021-02-01 – 2021-02-08 (×8): 2000 [IU] via ORAL
  Filled 2021-01-31 (×8): qty 2

## 2021-01-31 MED ORDER — ROCURONIUM BROMIDE 10 MG/ML (PF) SYRINGE
PREFILLED_SYRINGE | INTRAVENOUS | Status: DC | PRN
Start: 2021-01-31 — End: 2021-01-31
  Administered 2021-01-31 (×2): 20 mg via INTRAVENOUS
  Administered 2021-01-31: 60 mg via INTRAVENOUS
  Administered 2021-01-31: 40 mg via INTRAVENOUS
  Administered 2021-01-31: 20 mg via INTRAVENOUS

## 2021-01-31 MED ORDER — LIDOCAINE 2% (20 MG/ML) 5 ML SYRINGE
INTRAMUSCULAR | Status: DC | PRN
Start: 2021-01-31 — End: 2021-01-31
  Administered 2021-01-31: 40 mg via INTRAVENOUS

## 2021-01-31 MED ORDER — ALBUMIN HUMAN 5 % IV SOLN
12.5000 g | Freq: Once | INTRAVENOUS | Status: AC
  Administered 2021-01-31: 12.5 g via INTRAVENOUS

## 2021-01-31 MED ORDER — ONDANSETRON HCL 4 MG/2ML IJ SOLN
INTRAMUSCULAR | Status: AC
  Filled 2021-01-31: qty 2

## 2021-01-31 MED ORDER — 0.9 % SODIUM CHLORIDE (POUR BTL) OPTIME
TOPICAL | Status: DC | PRN
  Administered 2021-01-31: 1000 mL

## 2021-01-31 MED ORDER — HYDROCODONE-ACETAMINOPHEN 7.5-325 MG PO TABS
1.0000 | ORAL_TABLET | Freq: Four times a day (QID) | ORAL | Status: DC
  Administered 2021-01-31 – 2021-02-08 (×29): 1 via ORAL
  Filled 2021-01-31 (×30): qty 1

## 2021-01-31 SURGICAL SUPPLY — 78 items
ADH SKN CLS APL DERMABOND .7 (GAUZE/BANDAGES/DRESSINGS) ×1
AGENT HMST MTR 8 SURGIFLO (HEMOSTASIS)
BAG COUNTER SPONGE SURGICOUNT (BAG) ×2 IMPLANT
BAG SPNG CNTER NS LX DISP (BAG) ×1
BLADE CLIPPER SURG (BLADE) IMPLANT
BONE VIVIGEN FORMABLE 5.4CC (Bone Implant) ×2 IMPLANT
BUR MATCHSTICK NEURO 3.0 LAGG (BURR) ×2 IMPLANT
BUR SABER RD CUTTING 3.0 (BURR) IMPLANT
CAGE LORD XPAC 10X28 (Cage) ×1 IMPLANT
CONNECTOR EXPEDIUM TI 55MM (Connector) ×2 IMPLANT
COVER BACK TABLE 80X110 HD (DRAPES) ×2 IMPLANT
COVER SURGICAL LIGHT HANDLE (MISCELLANEOUS) ×2 IMPLANT
DECANTER SPIKE VIAL GLASS SM (MISCELLANEOUS) ×2 IMPLANT
DERMABOND ADVANCED (GAUZE/BANDAGES/DRESSINGS) ×1
DERMABOND ADVANCED .7 DNX12 (GAUZE/BANDAGES/DRESSINGS) ×1 IMPLANT
DRAPE C-ARM 42X72 X-RAY (DRAPES) ×2 IMPLANT
DRAPE C-ARMOR (DRAPES) ×2 IMPLANT
DRAPE MICROSCOPE LEICA (MISCELLANEOUS) ×2 IMPLANT
DRAPE POUCH INSTRU U-SHP 10X18 (DRAPES) ×2 IMPLANT
DRAPE SURG 17X23 STRL (DRAPES) ×8 IMPLANT
DRSG MEPILEX BORDER 4X12 (GAUZE/BANDAGES/DRESSINGS) ×1 IMPLANT
DRSG MEPILEX BORDER 4X4 (GAUZE/BANDAGES/DRESSINGS) IMPLANT
DRSG MEPILEX BORDER 4X8 (GAUZE/BANDAGES/DRESSINGS) IMPLANT
DURAPREP 26ML APPLICATOR (WOUND CARE) ×2 IMPLANT
ELECT BLADE 4.0 EZ CLEAN MEGAD (MISCELLANEOUS) ×2
ELECT BLADE 6.5 EXT (BLADE) IMPLANT
ELECT CAUTERY BLADE 6.4 (BLADE) ×2 IMPLANT
ELECT REM PT RETURN 9FT ADLT (ELECTROSURGICAL) ×2
ELECTRODE BLDE 4.0 EZ CLN MEGD (MISCELLANEOUS) ×1 IMPLANT
ELECTRODE REM PT RTRN 9FT ADLT (ELECTROSURGICAL) ×1 IMPLANT
EVACUATOR 1/8 PVC DRAIN (DRAIN) IMPLANT
GAUZE SPONGE 4X4 12PLY STRL (GAUZE/BANDAGES/DRESSINGS) ×2 IMPLANT
GLOVE SRG 8 PF TXTR STRL LF DI (GLOVE) ×1 IMPLANT
GLOVE SURG 8.5 LATEX PF (GLOVE) ×2 IMPLANT
GLOVE SURG LTX SZ9 (GLOVE) ×2 IMPLANT
GLOVE SURG ORTHO LTX SZ7.5 (GLOVE) ×2 IMPLANT
GLOVE SURG UNDER POLY LF SZ8 (GLOVE) ×2
GOWN STRL REUS W/ TWL LRG LVL3 (GOWN DISPOSABLE) ×1 IMPLANT
GOWN STRL REUS W/TWL 2XL LVL3 (GOWN DISPOSABLE) ×4 IMPLANT
GOWN STRL REUS W/TWL LRG LVL3 (GOWN DISPOSABLE) ×2
GRAFT BNE MATRIX VG FRMBL MD 5 (Bone Implant) IMPLANT
KIT BASIN OR (CUSTOM PROCEDURE TRAY) ×2 IMPLANT
KIT POSITION SURG JACKSON T1 (MISCELLANEOUS) ×2 IMPLANT
KIT TURNOVER KIT B (KITS) ×2 IMPLANT
NDL SPNL 18GX3.5 QUINCKE PK (NEEDLE) ×1 IMPLANT
NEEDLE 22X1 1/2 (OR ONLY) (NEEDLE) ×2 IMPLANT
NEEDLE SPNL 18GX3.5 QUINCKE PK (NEEDLE) ×2 IMPLANT
NS IRRIG 1000ML POUR BTL (IV SOLUTION) ×2 IMPLANT
PACK LAMINECTOMY ORTHO (CUSTOM PROCEDURE TRAY) ×2 IMPLANT
PAD ARMBOARD 7.5X6 YLW CONV (MISCELLANEOUS) ×4 IMPLANT
PATTIES SURGICAL .75X.75 (GAUZE/BANDAGES/DRESSINGS) ×2 IMPLANT
PATTIES SURGICAL 1X1 (DISPOSABLE) ×2 IMPLANT
ROD EXPEDIUM PRE BENT 55MM (Rod) ×2 IMPLANT
ROD PRE LORDOSED 5.5X45 (Rod) ×1 IMPLANT
SCREW CORT FIX FEN 5.5X7X55MM (Screw) ×1 IMPLANT
SCREW SET SINGLE INNER (Screw) ×2 IMPLANT
SCREW VIPER 7X50MM (Screw) ×1 IMPLANT
SPOGE SURGIFLO 8M (HEMOSTASIS)
SPONGE SURGIFLO 8M (HEMOSTASIS) IMPLANT
SPONGE SURGIFOAM ABS GEL 100 (HEMOSTASIS) ×2 IMPLANT
SUT VIC AB 0 CT1 27 (SUTURE) ×2
SUT VIC AB 0 CT1 27XBRD ANBCTR (SUTURE) ×1 IMPLANT
SUT VIC AB 1 CTX 36 (SUTURE) ×4
SUT VIC AB 1 CTX36XBRD ANBCTR (SUTURE) ×2 IMPLANT
SUT VIC AB 2-0 CT1 27 (SUTURE) ×2
SUT VIC AB 2-0 CT1 TAPERPNT 27 (SUTURE) ×1 IMPLANT
SUT VIC AB 3-0 X1 27 (SUTURE) ×2 IMPLANT
SYR 20ML LL LF (SYRINGE) ×2 IMPLANT
SYR CONTROL 10ML LL (SYRINGE) ×4 IMPLANT
TAP CANN VIPER2 DL 5.0 (TAP) ×1 IMPLANT
TAP CANN VIPER2 DL 6.0 (TAP) ×1 IMPLANT
TAP CANN VIPER2 DL 7.0 (TAP) ×1 IMPLANT
TAP VIPER MIS 4.35MM (TAP) ×1 IMPLANT
TOWEL GREEN STERILE (TOWEL DISPOSABLE) ×2 IMPLANT
TOWEL GREEN STERILE FF (TOWEL DISPOSABLE) ×2 IMPLANT
TRAY FOLEY MTR SLVR 16FR STAT (SET/KITS/TRAYS/PACK) ×2 IMPLANT
WATER STERILE IRR 1000ML POUR (IV SOLUTION) ×2 IMPLANT
YANKAUER SUCT BULB TIP NO VENT (SUCTIONS) ×2 IMPLANT

## 2021-01-31 NOTE — Transfer of Care (Signed)
Immediate Anesthesia Transfer of Care Note  Patient: [name] [name]  Procedure(s) Performed: RIGHT L3-4 TRANSFORAMINAL LUMBAR INTERBODY FUSION WITH RODS, SCREWS, CAGE, LOCAL AND ALLOGRAFT BONE GRAFT, VIVIGEN, RIGHT L2-3 LATERAL RECESS DECOMPRESSION steroid injection of right knee  Patient Location: PACU  Anesthesia Type:General  Level of Consciousness: drowsy, patient cooperative and responds to stimulation  Airway & Oxygen Therapy: Patient Spontanous Breathing and Patient connected to face mask oxygen  Post-op Assessment: Report given to RN, Post -op Vital signs reviewed and stable and Patient moving all extremities X 4  Post vital signs: Reviewed and stable  Last Vitals:  Vitals Value Taken Time  BP 141/72 01/31/21 1243  Temp    Pulse 89 01/31/21 1246  Resp 17 01/31/21 1246  SpO2 95 % 01/31/21 1246  Vitals shown include unvalidated device data.  Last Pain:  Vitals:   01/31/21 0612  TempSrc:   PainSc: 9       Patients Stated Pain Goal: 3 (01/31/21 0612)  Complications: No notable events documented.

## 2021-01-31 NOTE — Progress Notes (Signed)
Orthopedic Tech Progress Note Patient Details:  [name] [name] 1950-07-02 [id number]  RN stated " patient has brace '   Patient ID: [name] [name], female   DOB: 07-27-1950, 70 y.o.   MRN: [id number]  [name] 01/31/2021, 2:53 PM

## 2021-01-31 NOTE — Brief Op Note (Signed)
01/31/2021  12:35 PM  PATIENT:  [name] [name]  70 y.o. female  PRE-OPERATIVE DIAGNOSIS:  lumbar spondylolisthesis L3-4 above L4 to S1 fusion, right L2-3 lateral recess stenosis  POST-OPERATIVE DIAGNOSIS:  lumbar spondylolisthesis L3-4 above L4 to S1 fusion, right L2-3 lateral recess stenosis  PROCEDURE:  Procedure(s): RIGHT L3-4 TRANSFORAMINAL LUMBAR INTERBODY FUSION WITH RODS, SCREWS, CAGE, LOCAL AND ALLOGRAFT BONE GRAFT, VIVIGEN, RIGHT L2-3 LATERAL RECESS DECOMPRESSION steroid injection of right knee (N/A)  SURGEON:  Surgeon(s) and Role:    * Kerrin Champagne, MD - Primary  PHYSICIAN ASSISTANT: Andee Lineman  ANESTHESIA:   local and general  EBL:  150 mL   BLOOD ADMINISTERED:none  DRAINS: Urinary Catheter (Foley)   LOCAL MEDICATIONS USED:  MARCAINE 0.5% 1:1 Exparenl 1.3%   and Amount: 20 ml  SPECIMEN:  No Specimen  DISPOSITION OF SPECIMEN:  N/A  COUNTS:  YES  TOURNIQUET:  * No tourniquets in log *  DICTATION: .Dragon Dictation  PLAN OF CARE: Admit to inpatient   PATIENT DISPOSITION:  PACU - hemodynamically stable.   Delay start of Pharmacological VTE agent (>24hrs) due to surgical blood loss or risk of bleeding: no

## 2021-01-31 NOTE — Interval H&P Note (Signed)
History and Physical Interval Note:  01/31/2021 7:51 AM  [name] [name]  has presented today for surgery, with the diagnosis of lumbar spondylolisthesis L3-4 above L4 to S1 fusion, right L2-3 lateral recess stenosis.  The various methods of treatment have been discussed with the patient and family. After consideration of risks, benefits and other options for treatment, the patient has consented to  Procedure(s): RIGHT L3-4 TRANSFORAMINAL LUMBAR INTERBODY FUSION WITH RODS, SCREWS, CAGE, LOCAL AND ALLOGRAFT BONE GRAFT, VIVIGEN, RIGHT L2-3 LATERAL RECESS DECOMPRESSION (N/A) as a surgical intervention.  The patient's history has been reviewed, patient examined, no change in status, stable for surgery.  I have reviewed the patient's chart and labs.  Questions were answered to the patient's satisfaction.     [name]

## 2021-01-31 NOTE — Anesthesia Procedure Notes (Signed)
Procedure Name: Intubation Date/Time: 01/31/2021 8:00 AM Performed by: Shary Decamp, CRNA Pre-anesthesia Checklist: Patient identified, Patient being monitored, Timeout performed, Emergency Drugs available and Suction available Patient Re-evaluated:Patient Re-evaluated prior to induction Oxygen Delivery Method: Circle System Utilized Preoxygenation: Pre-oxygenation with 100% oxygen Induction Type: IV induction Ventilation: Mask ventilation without difficulty Laryngoscope Size: Miller and 2 Grade View: Grade I Tube type: Oral Tube size: 7.5 mm Number of attempts: 1 Airway Equipment and Method: Stylet Placement Confirmation: ETT inserted through vocal cords under direct vision, positive ETCO2 and breath sounds checked- equal and bilateral Secured at: 21 cm Tube secured with: Tape Dental Injury: Teeth and Oropharynx as per pre-operative assessment

## 2021-01-31 NOTE — Interval H&P Note (Signed)
History and Physical Interval Note:  01/31/2021 7:52 AM  [name] [name]  has presented today for surgery, with the diagnosis of lumbar spondylolisthesis L3-4 above L4 to S1 fusion, right L2-3 lateral recess stenosis.  The various methods of treatment have been discussed with the patient and family. After consideration of risks, benefits and other options for treatment, the patient has consented to  Procedure(s): RIGHT L3-4 TRANSFORAMINAL LUMBAR INTERBODY FUSION WITH RODS, SCREWS, CAGE, LOCAL AND ALLOGRAFT BONE GRAFT, VIVIGEN, RIGHT L2-3 LATERAL RECESS DECOMPRESSION (N/A) as a surgical intervention.  The patient's history has been reviewed, patient examined, no change in status, stable for surgery.  I have reviewed the patient's chart and labs.  Questions were answered to the patient's satisfaction.     [name]

## 2021-01-31 NOTE — Discharge Instructions (Signed)

## 2021-01-31 NOTE — Evaluation (Addendum)
Occupational Therapy Evaluation Patient Details Name: [name] [name] MRN: [id number] DOB: 02-01-1951 Today's Date: 01/31/2021    History of Present Illness Pt is a 70 yo femal now s/p RIGHT L3-4 TRANSFORAMINAL LUMBAR INTERBODY FUSION.   Clinical Impression   This 70 yo female admitted and underwent above presents to acute OT with PLOF of being totally independent with basic ADLs and IADLs. Currently she is setup-Max a for basic ADLs due to back precautions she now needs to follow and pain. She will continue to benefit from acute OT with follow up at SNF to get to a Mod I to return home alone.    Follow Up Recommendations  SNF;Supervision/Assistance - 24 hour    Equipment Recommendations  None recommended by OT           Precautions / Restrictions Precautions Precautions: Back Precaution Booklet Issued: Yes (comment) Required Braces or Orthoses: Spinal Brace (brace not here yet) Spinal Brace: Applied in sitting position Restrictions Weight Bearing Restrictions: No      Mobility Bed Mobility Overal bed mobility: Needs Assistance Bed Mobility: Rolling;Sit to Sidelying;Sidelying to Sit Rolling: Supervision Sidelying to sit: Min guard     Sit to sidelying: Min assist (A for legs)      Transfers Overall transfer level: Needs assistance Equipment used: Rolling walker (2 wheeled) Transfers: Sit to/from Stand Sit to Stand: Min assist         General transfer comment: VCs for safe hand placement    Balance Overall balance assessment: Mild deficits observed, not formally tested                                         ADL either performed or assessed with clinical judgement   ADL Overall ADL's : Needs assistance/impaired Eating/Feeding: Independent;Sitting   Grooming: Set up;Sitting   Upper Body Bathing: Set up;Sitting   Lower Body Bathing: Moderate assistance Lower Body Bathing Details (indicate cue type and reason): min A sit<>stand Upper  Body Dressing : Set up;Sitting   Lower Body Dressing: Maximal assistance Lower Body Dressing Details (indicate cue type and reason): min A sit<>stand Toilet Transfer: Minimal assistance Toilet Transfer Details (indicate cue type and reason): side step up towards [facility] with RW Toileting- Clothing Manipulation and Hygiene: Moderate assistance Toileting - Clothing Manipulation Details (indicate cue type and reason): min A sit<>stand              Pertinent Vitals/Pain Pain Assessment: 0-10 Pain Score: 5  Pain Location: incisional Pain Descriptors / Indicators: Aching;Sore Pain Intervention(s): Limited activity within patient's tolerance;Monitored during session     Hand Dominance Right   Extremity/Trunk Assessment Upper Extremity Assessment Upper Extremity Assessment: Overall WFL for tasks assessed           Communication Communication Communication: No difficulties   Cognition Arousal/Alertness: Awake/alert Behavior During Therapy: WFL for tasks assessed/performed Overall Cognitive Status: Within Functional Limits for tasks assessed                                                Home Living Family/patient expects to be discharged to:: Skilled nursing facility Living Arrangements: Alone   Type of Home: House Home Access: Stairs to enter Entrance Stairs-Number of Steps: 1   Home Layout: One level  Bathroom Shower/Tub: IT trainer: Handicapped height     Home Equipment: Surveyor, minerals - single point          Prior Functioning/Environment Level of Independence: Independent                 OT Problem List: Decreased range of motion;Decreased strength;Impaired balance (sitting and/or standing);Pain;Decreased knowledge of use of DME or AE;Decreased knowledge of precautions      OT Treatment/Interventions: Self-care/ADL training;DME and/or AE instruction;Patient/family education;Balance training     OT Goals(Current goals can be found in the care plan section) Acute Rehab OT Goals Patient Stated Goal: to go to rehab because I live alone OT Goal Formulation: With patient Time For Goal Achievement: 02/14/21 Potential to Achieve Goals: Good  OT Frequency: Min 2X/week   Barriers to D/C: Decreased caregiver support             AM-PAC OT "6 Clicks" Daily Activity     Outcome Measure Help from another person eating meals?: None Help from another person taking care of personal grooming?: A Little Help from another person toileting, which includes using toliet, bedpan, or urinal?: A Lot Help from another person bathing (including washing, rinsing, drying)?: A Lot Help from another person to put on and taking off regular upper body clothing?: A Little Help from another person to put on and taking off regular lower body clothing?: A Lot 6 Click Score: 16   End of Session    Activity Tolerance: Patient tolerated treatment well Patient left: in bed;with call bell/phone within reach;with family/visitor present  OT Visit Diagnosis: Unsteadiness on feet (R26.81);Muscle weakness (generalized) (M62.81);Pain Pain - part of body:  (incisional)                Time: 5916-3846 OT Time Calculation (min): 21 min Charges:  OT General Charges $OT Visit: 1 Visit OT Evaluation $OT Eval Moderate Complexity: 1 Mod   Ignacia Palma, OTR/L Acute [employer] Pager [phone] Office [phone]    Evette Georges 01/31/2021, 5:52 PM

## 2021-01-31 NOTE — Op Note (Signed)
01/31/2021  12:52 PM  PATIENT:  [name] [name]  70 y.o. female  MRN: [id number]  OPERATIVE REPORT  PRE-OPERATIVE DIAGNOSIS:  lumbar spondylolisthesis L3-4 above L4 to S1 fusion, right L2-3 lateral recess stenosis  POST-OPERATIVE DIAGNOSIS:  lumbar spondylolisthesis L3-4 above L4 to S1 fusion, right L2-3 lateral recess stenosis  PROCEDURE:  Procedure(s): RIGHT L3-4 TRANSFORAMINAL LUMBAR INTERBODY FUSION WITH RODS, SCREWS, CAGE, LOCAL AND ALLOGRAFT BONE GRAFT, VIVIGEN, RIGHT L2-3 LATERAL RECESS DECOMPRESSION steroid injection of right knee    SURGEON:  Jessy Oto, MD     ASSISTANT:  Benjiman Core, PA-C  (Present throughout the entire procedure and necessary for completion of procedure in a timely manner)     ANESTHESIA:  General,    COMPLICATIONS:  None.     COMPONENTS:  Implant Name Type Inv. Item Serial No. Manufacturer Lot No. LRB No. Used Action  BONE VIVIGEN FORMABLE 5.4CC - [id number] Bone Implant BONE VIVIGEN FORMABLE 5.4CC [id number] [employer]  N/A 1 Implanted  ROD PRE LORDOSED 5.5X45 - [id number] Rod ROD PRE LORDOSED 5.5X45  [employer]  N/A 1 Implanted  CONNECTOR EXPEDIUM TI 55MM - [id number] Connector CONNECTOR EXPEDIUM TI 55MM  [employer]  N/A 2 Implanted  CAGE LORD XPAC 10X28 - [id number] Cage CAGE LORD XPAC 10X28  [employer]  N/A 1 Implanted  SCREW SET SINGLE INNER - [id number] Screw SCREW SET SINGLE INNER  [employer]  N/A 2 Implanted  ROD EXPEDIUM PRE BENT 55MM - [id number] Rod ROD EXPEDIUM PRE BENT 55MM  [employer]  N/A 2 Implanted  SCREW CORT FIX FEN 5.5X7X55MM - [id number] Screw SCREW CORT FIX FEN 5.5X7X55MM  [employer]  N/A 1 Implanted  SCREW VIPER 7X50MM - [id number] Screw SCREW VIPER 7X50MM  [employer]  N/A 1 Implanted     PROCEDURE: The patient was met in the holding area, and the appropriate lumbar levels right  L3-4 and L2-3 identified and marked with an  "X" and my initials. This patient request injection of the right knee intraoperatively so that the right knee was also marked with an "x" nd my initials.  I had discussion with the patient in the preop holding area regarding a change of consent form to allow for the knee injection. The fusion level was reidentified as  L3-4. And right L2-3 partial hemilaminectomy for decompression of the right lateral recesss. Patient understands the rationale to perform TLIF at L3-4 level to decompress the bilateral L3-4 lateral recess and foramenal stenosis. The patient was then transported to OR and was placed under general anestheticwithout difficulty. The patient received appropriate preoperative antibiotic prophylaxis clindamycin 910m IV for multiple antibiotic allergies.  Nursing staff inserted a Foley catheter under sterile conditions. The patient was then turned to a prone position using the JKimballspine frame. PAS. all pressure points well padded the arms at the side to 90 90. Standard prep with DuraPrep solution draped in the usual manner from the lower dorsal spine the mid sacral segment. Iodine Vi-Drape was used and the old incision scar was marked. Time-out procedure was called and correct. Skin in the midline between L2 and L5 was then infiltrated with local anesthesia, marcaine 1/2% 1:1 exparel 1.3% total 20 cc used. Incision was then made  extending from L2-L5  through the skin and subcutaneous layers down to the patient's lumbodorsal fascia and spinous processes. The upper 2/3rds of the old incision scar was ellipsed and  resected. The incision then carried sharply excising the supraspinous ligament and then continuing the lateral aspect of the spinous processes of L2, L3 and L4. Cobb elevator used to carefully elevate the paralumbar muscles off of the posterior elements using electrocautery carefully drilled bleeding and perform dissection of the muscle tissues of the preserving the facet capsule at the L2-3.  Continuing the exposure out laterally to expose the lateral margin of the facet joint line at L2-3 and L3-4. The retained hardware at L4 and L5 pedicle screws and rods were exposed and the rod debrided of bone and soft tissure to allow for placement of interrod sleeves at the L4-5 level, Incision was carried in the midline down to the L5 level area bleeders controlled using electrocautery monopolar electrocautery.   C-arm fluoroscopy was then brought into the field and using C-arm fluoroscopy then a hole made into the medial aspect of the pedicle of L3 using a high speed burr observed in the pedicle using C arm at the 5 oclock position on the left L3 pedicle nerve probe initial entry was determined on fluoroscopy to be good position alignment so that a 4.4m tap was passed to 50 mm within the left L3 pedicle to a depth of nearly 50 mm observed on C-arm fluoroscopy to be beyond the midpoint of the lumbar vertebra and then position alignment within the left L3 pedicle this was then removed and the pedicle channel probed demonstrating patency no sign of rupture the cortex of the pedicle. Tapping with a 4.330mscrew tap then 6 mm tap then a 7 mm tap a 7.0 mm x 50 mm screw was placed on the left side at the Left L3 pedicle level.  C-arm fluoroscopy was then brought into the field and using C-arm fluoroscopy then a hole made into the posterior medial aspect of the pedicle of right L3 observed in the pedicle using ball tipped nerve hook and hockey stick nerve probe initial entry was determined on fluoroscopy to be good position alignment so that 4.3516map was then used to tap the right L3 pedicle to a depth of nearly 55 mm observed on C-arm fluoroscopy to be beyond the midpoint of the lumbar vertebra and then position alignment within the right L3 pedicle this was then removed and the pedicle channel probed demonstrating patency no sign of rupture the cortex of the pedicle. Tapping with a 6 mm screw tap then tapping  with a 7.0 mm tap, a 7.0 mm x 55 mm screw was saved for later placement after decompression and TLIF. A inter rod sleeve connector then placed on the right side at the L4-5 level, bone tamp used to ensure seating then the screws tightened fixing the sleeve to the retained rod with torque screw driver to 85 foot lbs. Spinous processes of the inferior 60% of L3 were then resected down to the base the lamina.  Leksell rongeur the osteotomes used to resect inferior aspect of the lamina on the right side at the L3 level and partially on the left side at L3 The left medial 40% of the left facet of L3-4 were resected in order to decompress the left lateral recess and right side of the lumbar thecal sac at L3-4 and decompress the bilateral  L3 and L4 neuroforamen. The right L3-4 face was resected using Osteotomes and 2mm103md 3mm 15mrisons were used for this portion of the decompression. Similarly the right side decompression was carried out but  Near complete facetectomy was perform on the  right at L3-4 to provide for exposure of the right side L3-4 neuroforamen for ease of placement of TLIF (transforaminal lumbar interbody fusion) at the L3 level inferior portions of the lamina and pars were also resected first beginning with the Leksell rongeur and osteotomes and then resecting using 2 and 3 mm Kerrison. Continued laminectomy was carried out resecting the central portions of the lamina of L3 and upper L4 performing foraminotomies on the right side at the L3 and L4levels. The inferior articular process  L3 was resected on the right side.  A large amount of hypertrophic ligmentum flavum was found impressing on the right and left lateral recesses at L3-4 and narrowing the respective L4 and L3 neuroforamen.  Loupe magnification and headlight were used during this portion procedure. Then the Operating Room Microscope sterilely draped and brought into the field.  Attention then turned to placement of the transforaminal lumbar  interbody fusion cages.  Bleeding controlled using bipolar electrocautery thrombin soaked gel cottonoids.  The operating room microscope sterilely draped brought into the field. Under the operating room microscope, the right L2-3interspace carefully debrided the small amount of muscle attachment here and high-speed bur used to drill the medial aspect of the inferior articular process of L2 approximately 10%.  3 mm Kerrison then used to enter the spinal canal over the superior aspect of the L3 lamina carefully using the Kerrison to debris the attachment as a curet. Foraminotomy was then performed over the L3 nerve root. The medial 10% superior articular process of L3 and then resected using an osteotome and 2 mm Kerrison. This allowed for identification of the thecal sac. Penfield 4 was then used to carefully mobilize the thecal sac medially and the L3 nerve root identified within the lateral recess flattened over the posterior aspect of the bulging right L2-3 disc. Carefully the lateral aspect of the L3 nerve root was identified and a Penfield 4 was used to mobilize the nerve medially such that the disc was visible with microscope. Further foraminotomies was performed over the L4 nerve root the nerve root was noted to be decompressed. The nerve root able to be retracted along the medial aspect of the L5 pedicle and no disc herniation was present however there was significant hyper trophic changes of the right L2-3 medial flavum reflected off the medial right L2-3 facet.  Ligamentum flavum was further debrided superiorly to the level L2-3 disc. Had a moderate amount of further resection of the L2 lamina inferiorly was performed. With this then the disc space at L2-3 was easily visualized. Woodson nerve probe was then able to carefully palpate the neuroforamen for L2 and L3 finding these to be well decompressed. Attention then turned to the right L3-4 level which was easily visualized with the microscope for  performing the right L3-4 TLIF. Marland Kitchen  Then turned to the right L3-4 level the exposure the posterior lateral aspect this was carried out using a Penfield 4 bipolar electrocautery to control small bleeders present. Derricho retractor used to retract the thecal sac and L3 nerve root a 15 blade scalpel was used to incise posterior lateral aspect of the right L3-4 disc the disc space at this level showed a rather severe narrowing posteriorly was more open anteriorly so that an osteotome again was used to resect a small portion the posterior inferior lip of the vertebral body at L3  in order to gain ease of access into the L3-4 disc space. The space was debrided of degenerative disc material using pituitary  along root the entire disc space was then debrided of degenerative disc material using pituitary rongeurs curettage down to bleeding bone endplates. 54m and 8 mm shavers were used to debride the disc space and pituitary ronguers used to remove the loosened debris. This space was then carefully assess using spacers  a 9.068mtrial cage provided the best fit, the Depuy 10x 28  lordotic XPAC cage. The disc space first packed with autograft harvested locally. Lordotic concorde cage packed with local bone graft was placed into the intervertebral disc space. The posterior intervertebral disc space was then packed with autogenous local bone graft that been harvested from the central laminectomy and conform DBX demineralized bone graft, allograft. Bleeding controlled using bipolar electrocautery.  Observed on C-arm fluoroscopy to be in good position alignment. The cage at L3-4 was placed anteriorly as best as possible the correct patient's lordosis. With this then the transforaminal lumbar interbody fusion portion of the case was completed bleeders were controlled using bipolar electrocautery thrombin-soaked Gelfoam were appropriate.Decortication of the facet joints carried out bilateral L3-4. These were packed with cancellous  local bone graft.  The 2 viper corticofixation screws on the right were each placed and then each fastener carefully aligned  to allow for placement of rods. The right side first quarter inch titanium rod was then carefully contoured using the french benders and a precontoured 40 mm rod. This was then placed into the pedicle screws on the right extending from L3-L4 each of the caps were carefully placed loosely tightened. Attention turned to the left side were similarly and then screws were carefully adjusted to allow for a better pattern screws to allow for placement of fixation of the rod a quarter inch 40 mm precontoured titanium rod was then carefully contoured. This was able to be inserted into the left pedicle screw fasteners, Caps onto the L3 fasteners were tightened to 80 foot lbs. Across the right side  L3-4 screw fasteners compression was obtained on the right side between L3 and L4 compressing between the fasteners and tightening the screw caps 85 pounds. Similarly this was done on the left side at L3-4 obtaining compression and tightened 85 pounds. Irrigation was carried out with copious amounts of saline solution this was done throughout the case. Cell Saver was used during the case. Hockey stick neuroprobe was used to probe the neuroforamen bilateral L3and L4 these were determined to be well decompressed. Permanent C-arm images were obtained in AP and lateral plane and oblique planes. Remaining local bone graft was then applied along both lateral posterior lateral region extending from L3 to L4 facet beds.Gelfoam was then removed spinal canal. The lumbodorsal musculature carefully exam debrided of any devitalized tissue following removal of self retaining retractors were the bleeders were controlled using electrocautery and the area dorsal lumbar muscle were then approximated in the midline with interrupted #1 Vicryl sutures loose the dorsal fascia was reattached to the spinous process of L2-3   superiorly and L5  inferiorly this was done with #1 Vicryl sutures. Subcutaneous layers then approximated using interrupted 0 Vicryl sutures and 2-0 Vicryl sutures. Skin was closed with a running subcutaneous stitch of 4-0 Vicryl Dermabond was applied then MedPlex bandage. All instrument and sponge counts were correct. The patient was then returned to a supine position on her bed reactivated extubated and returned to the recovery room in satisfactory condition.   JaBenjiman CorePA-C perform the duties of assistant surgeon during this case. He was present from the beginning of the  case to the end of the case assisting in transfer the patient from his stretcher to the OR table and back to the stretcher at the end of the case. Assisted in careful retraction and suction of the laminectomy site delicate neural structures operating under the operating room microscope. He performed closure of the incision from the fascia to the skin applying the dressing.         Basil Dess  01/31/2021, 12:52 PM

## 2021-01-31 NOTE — Progress Notes (Signed)
Patient has right knee osteoarthritis with varus deformity and primarily medial joint line narrowing. She request intraoperative right knee aspiration and cortisone injection. Will add to OR procedure consent and she has initialled and have I. .Will perform right knee aspiration and injection.

## 2021-01-31 NOTE — Progress Notes (Signed)
Orthopedic Tech Progress Note Patient Details:  [name] [name] 29-Apr-1951 [id number] Patient said that she did not have back brace so I delivered brace to patient and left at bedside.  Ortho Devices Type of Ortho Device: Lumbar corsett Ortho Device/Splint Interventions: Ordered      Avryl Roehm A Jiselle Sheu 01/31/2021, 7:50 PM

## 2021-01-31 NOTE — Anesthesia Postprocedure Evaluation (Signed)
Anesthesia Post Note  Patient: [name] [name]  Procedure(s) Performed: RIGHT L3-4 TRANSFORAMINAL LUMBAR INTERBODY FUSION WITH RODS, SCREWS, CAGE, LOCAL AND ALLOGRAFT BONE GRAFT, VIVIGEN, RIGHT L2-3 LATERAL RECESS DECOMPRESSION steroid injection of right knee     Patient location during evaluation: PACU Anesthesia Type: General Level of consciousness: awake Pain management: pain level controlled Vital Signs Assessment: post-procedure vital signs reviewed and stable Respiratory status: spontaneous breathing Cardiovascular status: stable Postop Assessment: no apparent nausea or vomiting Anesthetic complications: no   No notable events documented.  Last Vitals:  Vitals:   01/31/21 1315 01/31/21 1330  BP: (!) 99/52 (!) 103/42  Pulse: 69 68  Resp: 17 14  Temp:    SpO2: 93% 93%    Last Pain:  Vitals:   01/31/21 1315  TempSrc:   PainSc: 4                  Beverely Suen

## 2021-02-01 DIAGNOSIS — N182 Chronic kidney disease, stage 2 (mild): Secondary | ICD-10-CM | POA: Diagnosis not present

## 2021-02-01 DIAGNOSIS — M4316 Spondylolisthesis, lumbar region: Secondary | ICD-10-CM | POA: Diagnosis not present

## 2021-02-01 DIAGNOSIS — D62 Acute posthemorrhagic anemia: Secondary | ICD-10-CM | POA: Diagnosis not present

## 2021-02-01 DIAGNOSIS — M4726 Other spondylosis with radiculopathy, lumbar region: Secondary | ICD-10-CM | POA: Diagnosis not present

## 2021-02-01 DIAGNOSIS — Z20822 Contact with and (suspected) exposure to covid-19: Secondary | ICD-10-CM | POA: Diagnosis not present

## 2021-02-01 DIAGNOSIS — I2510 Atherosclerotic heart disease of native coronary artery without angina pectoris: Secondary | ICD-10-CM | POA: Diagnosis not present

## 2021-02-01 DIAGNOSIS — M4326 Fusion of spine, lumbar region: Secondary | ICD-10-CM | POA: Diagnosis not present

## 2021-02-01 DIAGNOSIS — I129 Hypertensive chronic kidney disease with stage 1 through stage 4 chronic kidney disease, or unspecified chronic kidney disease: Secondary | ICD-10-CM | POA: Diagnosis not present

## 2021-02-01 DIAGNOSIS — E1122 Type 2 diabetes mellitus with diabetic chronic kidney disease: Secondary | ICD-10-CM | POA: Diagnosis not present

## 2021-02-01 DIAGNOSIS — M48062 Spinal stenosis, lumbar region with neurogenic claudication: Secondary | ICD-10-CM | POA: Diagnosis not present

## 2021-02-01 LAB — GLUCOSE, CAPILLARY
Glucose-Capillary: 129 mg/dL — ABNORMAL HIGH (ref 70–99)
Glucose-Capillary: 106 mg/dL — ABNORMAL HIGH (ref 70–99)
Glucose-Capillary: 177 mg/dL — ABNORMAL HIGH (ref 70–99)
Glucose-Capillary: 136 mg/dL — ABNORMAL HIGH (ref 70–99)

## 2021-02-01 LAB — CBC WITH DIFFERENTIAL/PLATELET
Abs Immature Granulocytes: 0.06 10*3/uL (ref 0.00–0.07)
Basophils Absolute: 0 10*3/uL (ref 0.0–0.1)
Basophils Relative: 0 %
Eosinophils Absolute: 0 10*3/uL (ref 0.0–0.5)
Eosinophils Relative: 0 %
HCT: 27 % — ABNORMAL LOW (ref 36.0–46.0)
Hemoglobin: 8.9 g/dL — ABNORMAL LOW (ref 12.0–15.0)
Immature Granulocytes: 1 %
Lymphocytes Relative: 11 %
Lymphs Abs: 1.4 10*3/uL (ref 0.7–4.0)
MCH: 30.8 pg (ref 26.0–34.0)
MCHC: 33 g/dL (ref 30.0–36.0)
MCV: 93.4 fL (ref 80.0–100.0)
Monocytes Absolute: 1 10*3/uL (ref 0.1–1.0)
Monocytes Relative: 8 %
Neutro Abs: 10 10*3/uL — ABNORMAL HIGH (ref 1.7–7.7)
Neutrophils Relative %: 80 %
Platelets: 151 10*3/uL (ref 150–400)
RBC: 2.89 MIL/uL — ABNORMAL LOW (ref 3.87–5.11)
RDW: 13.4 % (ref 11.5–15.5)
WBC: 12.4 10*3/uL — ABNORMAL HIGH (ref 4.0–10.5)
nRBC: 0 % (ref 0.0–0.2)

## 2021-02-01 LAB — CBC
HCT: 26.4 % — ABNORMAL LOW (ref 36.0–46.0)
Hemoglobin: 8.7 g/dL — ABNORMAL LOW (ref 12.0–15.0)
MCH: 30.4 pg (ref 26.0–34.0)
MCHC: 33 g/dL (ref 30.0–36.0)
MCV: 92.3 fL (ref 80.0–100.0)
Platelets: 152 10*3/uL (ref 150–400)
RBC: 2.86 MIL/uL — ABNORMAL LOW (ref 3.87–5.11)
RDW: 13.3 % (ref 11.5–15.5)
WBC: 10.3 10*3/uL (ref 4.0–10.5)
nRBC: 0 % (ref 0.0–0.2)

## 2021-02-01 LAB — BASIC METABOLIC PANEL
Anion gap: 10 (ref 5–15)
BUN: 18 mg/dL (ref 8–23)
CO2: 25 mmol/L (ref 22–32)
Calcium: 9.3 mg/dL (ref 8.9–10.3)
Chloride: 102 mmol/L (ref 98–111)
Creatinine, Ser: 1.09 mg/dL — ABNORMAL HIGH (ref 0.44–1.00)
GFR, Estimated: 54 mL/min — ABNORMAL LOW (ref >60)
Glucose, Bld: 139 mg/dL — ABNORMAL HIGH (ref 70–99)
Potassium: 3.8 mmol/L (ref 3.5–5.1)
Sodium: 137 mmol/L (ref 135–145)

## 2021-02-01 MED ORDER — FERROUS GLUCONATE 324 (38 FE) MG PO TABS
324.0000 mg | ORAL_TABLET | Freq: Two times a day (BID) | ORAL | Status: DC
  Administered 2021-02-01 – 2021-02-08 (×14): 324 mg via ORAL
  Filled 2021-02-01 (×16): qty 1

## 2021-02-01 NOTE — Progress Notes (Signed)
     Subjective: 1 Day Post-Op Procedure(s) (LRB): RIGHT L3-4 TRANSFORAMINAL LUMBAR INTERBODY FUSION WITH RODS, SCREWS, CAGE, LOCAL AND ALLOGRAFT BONE GRAFT, VIVIGEN, RIGHT L2-3 LATERAL RECESS DECOMPRESSION steroid injection of right knee (N/A) Awake, alert and oriented x 4. Anemia, Hgb is 8.6, preop 13. Diabetes on sliding scale. Foley out no void as yet. PT advises that she did well with treatment today. Patient is concerned that she does not have help at home, son only and no one to assist with bathing and ADLs.  Will have care team help in assessment.   Patient reports pain as moderate.    Objective:   VITALS:  Temp:  [97.7 F (36.5 C)-98.9 F (37.2 C)] 98.4 F (36.9 C) (08/09 0746) Pulse Rate:  [68-91] 71 (08/09 0746) Resp:  [14-20] 18 (08/09 0746) BP: (95-150)/(42-82) 120/60 (08/09 0746) SpO2:  [93 %-100 %] 98 % (08/09 0746)  Neurologically intact ABD soft Neurovascular intact Sensation intact distally Intact pulses distally Dorsiflexion/Plantar flexion intact Incision: dressing C/D/I and no drainage Compartment soft   LABS Recent Labs    02/01/21 0416  HGB 8.7*  WBC 10.3  PLT 152   Recent Labs    02/01/21 0416  NA 137  K 3.8  CL 102  CO2 25  BUN 18  CREATININE 1.09*  GLUCOSE 139*   No results for input(s): LABPT, INR in the last 72 hours.   Assessment/Plan: 1 Day Post-Op Procedure(s) (LRB): RIGHT L3-4 TRANSFORAMINAL LUMBAR INTERBODY FUSION WITH RODS, SCREWS, CAGE, LOCAL AND ALLOGRAFT BONE GRAFT, VIVIGEN, RIGHT L2-3 LATERAL RECESS DECOMPRESSION steroid injection of right knee (N/A)  Advance diet Up with therapy D/C IV fluids Discharge to SNF Will ask for care management assessment.   [name] 02/01/2021, 7:52 AM Patient ID: [name] [name], female   DOB: 1950-07-20, 70 y.o.   MRN: [id number]

## 2021-02-01 NOTE — TOC Initial Note (Signed)
Transition of Care [facility]) - Initial/Assessment Note    Patient Details  Name: [name] [name] MRN: [id number] Date of Birth: 1950-10-10  [facility] CM/SW Contact:    Emeterio Reeve, LCSW Phone Number: 02/01/2021, 1:39 PM  Clinical Narrative:                 CSW received SNF consult. CSW met with pt at bedside. CSW introduced self and explained role at the hospital. Pt reports that PTA she lives at home alone. Pt uses a can at home but its otherwise independent with mobility and ADL's.   CSW reviewed PT/OT recommendations for SNF. Pt reports she does need extra help before returning home.  Pt gave CSW permission to fax out to facilities in the area. Pt has no preference of facility at this time but requested to stay close to home. Pt asked for her information not to be sent to [facility]. CSW gave pt medicare.gov rating list to review. CSW explained insurance auth process. Pt reports they are covid vaccinated.  CSW will continue to follow.   Expected Discharge Plan: Skilled Nursing Facility Barriers to Discharge: Continued Medical Work up, Ship broker   Patient Goals and CMS Choice Patient states their goals for this hospitalization and ongoing recovery are:: TO get stronger CMS Medicare.gov Compare Post Acute Care list provided to:: Patient Choice offered to / list presented to : Patient  Expected Discharge Plan and Services Expected Discharge Plan: [place] arrangements for the past 2 months: Single Family Home                                      Prior Living Arrangements/Services Living arrangements for the past 2 months: Single Family Home Lives with:: Self Patient language and need for interpreter reviewed:: Yes        Need for Family Participation in Patient Care: Yes (Comment) Care giver support system in place?: Yes (comment) Current home services: DME Criminal Activity/Legal Involvement Pertinent to Current  Situation/Hospitalization: No - Comment as needed  Activities of Daily Living      Permission Sought/Granted Permission sought to share information with : Facility Arts administrator granted to share info w AGENCY: SNF        Emotional Assessment Appearance:: Appears stated age Attitude/Demeanor/Rapport: Engaged Affect (typically observed): Appropriate Orientation: : Oriented to Self, Oriented to Place, Oriented to  Time, Oriented to Situation Alcohol / Substance Use: Not Applicable Psych Involvement: No (comment)  Admission diagnosis:  Fusion of spine of lumbar region [M43.26] Patient Active Problem List   Diagnosis Date Noted   Fusion of spine of lumbar region 01/31/2021   S/P lumbar fusion L4 to Sacrum 06/22/09 10/11/2020   Myalgia and myositis 06/12/2018   Other and unspecified nonspecific immunological findings 04/25/2018   Osteoarthrosis, hand 10/19/2017   Insomnia 09/23/2015   Plantar fascial fibromatosis 09/23/2015   Shortness of breath 04/28/2015   Abnormal myocardial perfusion study    Disturbance of skin sensation 08/05/2014   Chronic kidney disease, stage III (moderate) (HCC) 02/02/2014   Impaired fasting glucose 02/02/2014   Vitamin D deficiency 10/30/2013   Other osteoporosis 07/29/2012   Myocardial infarction ([place]) 12/29/2010   Cardiovascular disease 05/26/2009   KNEE PAIN, LEFT 07/02/2008   HIP PAIN 04/27/2008   OBESITY 02/24/2008   HYPERGLYCEMIA, FASTING 02/10/2008  GERD 03/12/2007   ANXIETY DEPRESSION 08/28/2006   DEGENERATIVE [place] DISEASE 08/28/2006   HYPERLIPIDEMIA 05/29/2006   CARPAL TUNNEL SYNDROME 05/29/2006   HYPERTENSION 05/29/2006   Unspecified constipation 05/29/2006   IBS 05/29/2006   ARTHRITIS 05/29/2006   LOW BACK PAIN 05/29/2006   PCP:  Curlene Labrum, MD Pharmacy:   [place], [place] [address] [address] Phone: [phone] Fax: [phone]  [id number] [place]  [address]Phone: [phone]: [phone] [facility] [place] [address] [place]: [phone]: [phone]    Social Determinants of Health (SDOH) Interventions    Readmission Risk Interventions No flowsheet data found.  MEmeterio Reeve LLatanya Presser LBethel ManorSocial Worker [phone]

## 2021-02-01 NOTE — Evaluation (Signed)
Physical Therapy Evaluation Patient Details Name: [name] [name] MRN: [id number] DOB: 1951/03/31 Today's Date: 02/01/2021   History of Present Illness  Pt is a 70 yo femal now s/p RIGHT L3-4 TRANSFORAMINAL LUMBAR INTERBODY FUSION.  Clinical Impression  Patient is s/p above surgery resulting in functional limitations due to the deficits listed below (see PT Problem List). Pt comes from home where she lives alone in a home with a single steps to enter; Independent at baseline; Presents to PT with decr functional mobility, movement restrictions, decr endurance, need for assistive device with amb; all of these pointing to a decline in functional status; Pt is requesting to pursue a SNF stay for post-acute rehab to maximize her independence prior to going home; A SNF stay to maximize pt's independence is not unreasonable, especially if she doesn't have assistance at home;  Patient will benefit from skilled PT to increase their independence and safety with mobility to allow discharge to the venue listed below.       Follow Up Recommendations SNF;Supervision/Assistance - 24 hour    Equipment Recommendations  Rolling walker with 5" wheels;3in1 (PT)    Recommendations for Other Services       Precautions / Restrictions Precautions Precautions: Back Precaution Booklet Issued: Yes (comment) Precaution Comments: Recalled 2/3 back precaustions; needed cues for no lifting Required Braces or Orthoses: Spinal Brace Spinal Brace: Applied in sitting position Restrictions Weight Bearing Restrictions: No      Mobility  Bed Mobility Overal bed mobility: Needs Assistance             General bed mobility comments: Seated EOB upon entry.    Transfers Overall transfer level: Needs assistance Equipment used: Rolling walker (2 wheeled) Transfers: Sit to/from Stand Sit to Stand: Min guard         General transfer comment: Able to stand from EOB positioned in lowest setting; cues for hand  placement.  Ambulation/Gait Ambulation/Gait assistance: Min guard Gait Distance (Feet): 120 Feet Assistive device: Rolling walker (2 wheeled) Gait Pattern/deviations: Step-through pattern;Decreased step length - right;Decreased step length - left Gait velocity: slowed   General Gait Details: Occasional cues for RW proximity, and to bear down on RW with UEs to take pressure off of back, hips, legs; noted upper back and UEs fatigued with progressive amb  Stairs            Wheelchair Mobility    Modified Rankin (Stroke Patients Only)       Balance Overall balance assessment: Needs assistance Sitting-balance support: No upper extremity supported;Feet supported Sitting balance-Leahy Scale: Good     Standing balance support: Bilateral upper extremity supported;During functional activity Standing balance-Leahy Scale: Fair Standing balance comment: Reliant on BUE on RW with dynamic balance.                             Pertinent Vitals/Pain Pain Assessment: 0-10 Pain Score: 7  Pain Location: incisional Pain Descriptors / Indicators: Aching;Sore Pain Intervention(s): Monitored during session    Home Living Family/patient expects to be discharged to:: Skilled nursing facility Living Arrangements: Alone   Type of Home: House Home Access: Stairs to enter   [employer] of Steps: 1 Home Layout: One level Home Equipment: Toilet riser;Cane - single point      Prior Function Level of Independence: Independent               Hand Dominance   Dominant Hand: Right    Extremity/Trunk  Assessment   Upper Extremity Assessment Upper Extremity Assessment: Defer to OT evaluation    Lower Extremity Assessment Lower Extremity Assessment: Generalized weakness    Cervical / Trunk Assessment Cervical / Trunk Assessment: Other exceptions Cervical / Trunk Exceptions: s/p lumbar lurgery  Communication   Communication: No difficulties  Cognition  Arousal/Alertness: Awake/alert Behavior During Therapy: WFL for tasks assessed/performed Overall Cognitive Status: Within Functional Limits for tasks assessed                                 General Comments: Requires increased time to process verbal information specifically when learning new tasks.      General Comments General comments (skin integrity, edema, etc.): Clean/dry dressing at incision    Exercises     Assessment/Plan    PT Assessment Patient needs continued PT services  PT Problem List Decreased strength;Decreased activity tolerance;Decreased balance;Decreased mobility;Decreased knowledge of use of DME;Decreased safety awareness;Decreased knowledge of precautions       PT Treatment Interventions DME instruction;Gait training;Stair training;Functional mobility training;Therapeutic activities;Therapeutic exercise;Balance training;Patient/family education    PT Goals (Current goals can be found in the Care Plan section)  Acute Rehab PT Goals Patient Stated Goal: to go to rehab because I live alone PT Goal Formulation: With patient Time For Goal Achievement: 02/15/21 Potential to Achieve Goals: Good    Frequency Min 3X/week   Barriers to discharge Decreased caregiver support Pt is concerned she will not be able to manage safely at home; Requests a post-acute rehab stay    Co-evaluation               AM-PAC PT "6 Clicks" Mobility  Outcome Measure Help needed turning from your back to your side while in a flat bed without using bedrails?: A Little Help needed moving from lying on your back to sitting on the side of a flat bed without using bedrails?: A Little Help needed moving to and from a bed to a chair (including a wheelchair)?: A Little Help needed standing up from a chair using your arms (e.g., wheelchair or bedside chair)?: A Little Help needed to walk in hospital room?: A Little Help needed climbing 3-5 steps with a railing? : A Lot 6  Click Score: 17    End of Session Equipment Utilized During Treatment: Back brace Activity Tolerance: Patient tolerated treatment well Patient left: with call bell/phone within reach;Other (comment) (in bathroom) Nurse Communication: Mobility status;Other (comment) (Pt in bathroom) PT Visit Diagnosis: Other abnormalities of gait and mobility (R26.89);Pain Pain - part of body:  (Back)    Time: 1001-1019 PT Time Calculation (min) (ACUTE ONLY): 18 min   Charges:   PT Evaluation $PT Eval Low Complexity: 1 Low          Van Clines, PT  Acute Rehabilitation Services Pager [phone] Office [phone]   Levi Aland 02/01/2021, 11:51 AM

## 2021-02-01 NOTE — Progress Notes (Signed)
Physical Therapy Treatment Patient Details Name: [name] [name] MRN: [id number] DOB: Nov 29, 1950 Today's Date: 02/01/2021    History of Present Illness Pt is a 70 yo femal now s/p RIGHT L3-4 TRANSFORAMINAL LUMBAR INTERBODY FUSION.    PT Comments    Continuing work on functional mobility and activity tolerance;  Session focused on bed mobility; Questions solicited and answered; Positioned pt in R semi-sidelying at end of session; needs met   Follow Up Recommendations  SNF;Supervision/Assistance - 24 hour     Equipment Recommendations  Rolling walker with 5" wheels;3in1 (PT)    Recommendations for Other Services       Precautions / Restrictions Precautions Precautions: Back Precaution Booklet Issued: Yes (comment) Precaution Comments: Recalled 2/3 back precaustions; needed cues for no lifting Required Braces or Orthoses: Spinal Brace Spinal Brace: Applied in sitting position    Mobility  Bed Mobility Overal bed mobility: Needs Assistance Bed Mobility: Sit to Sidelying;Rolling Rolling: Supervision       Sit to sidelying: Min assist General bed mobility comments: Cues for technqiue, and close guard to monitor for no twisting; Assist to help LEs into bed    Transfers Overall transfer level: Needs assistance Equipment used: Rolling walker (2 wheeled) Transfers: Sit to/from Stand Sit to Stand: Min guard         General transfer comment: Able to stand from EOB positioned in lowest setting; cues for hand placement.  Ambulation/Gait Ambulation/Gait assistance: Min guard Gait Distance (Feet): 120 Feet Assistive device: Rolling walker (2 wheeled) Gait Pattern/deviations: Step-through pattern;Decreased step length - right;Decreased step length - left Gait velocity: slowed   General Gait Details: Occasional cues for RW proximity, and to bear down on RW with UEs to take pressure off of back, hips, legs; noted upper back and UEs fatigued with progressive amb   Stairs              Wheelchair Mobility    Modified Rankin (Stroke Patients Only)       Balance     Sitting balance-Leahy Scale: Good       Standing balance-Leahy Scale: Fair Standing balance comment: Reliant on BUE on RW with dynamic balance.                            Cognition Arousal/Alertness: Awake/alert Behavior During Therapy: WFL for tasks assessed/performed Overall Cognitive Status: Within Functional Limits for tasks assessed                                 General Comments: Requires increased time to process verbal information specifically when learning new tasks.      Exercises      General Comments General comments (skin integrity, edema, etc.): Discussed positioning options postop; pt chose to end session in R sidelying; bolstered her back with pillow and pillow between her knees      Pertinent Vitals/Pain Pain Assessment: Faces Pain Score: 7  Faces Pain Scale: Hurts little more Pain Location: incisional Pain Descriptors / Indicators: Aching;Sore Pain Intervention(s): Monitored during session    Home Living Family/patient expects to be discharged to:: Skilled nursing facility Living Arrangements: Alone   Type of Home: House Home Access: Stairs to enter   Home Layout: One level Home Equipment: Toilet riser;Cane - single point      Prior Function Level of Independence: Independent          PT  Goals (current goals can now be found in the care plan section) Acute Rehab PT Goals Patient Stated Goal: to go to rehab because I live alone PT Goal Formulation: With patient Time For Goal Achievement: 02/15/21 Potential to Achieve Goals: Good    Frequency     (back)      PT Plan Current plan remains appropriate    Co-evaluation              AM-PAC PT "6 Clicks" Mobility   Outcome Measure  Help needed turning from your back to your side while in a flat bed without using bedrails?: A Little Help needed moving  from lying on your back to sitting on the side of a flat bed without using bedrails?: A Little Help needed moving to and from a bed to a chair (including a wheelchair)?: A Little Help needed standing up from a chair using your arms (e.g., wheelchair or bedside chair)?: A Little Help needed to walk in hospital room?: A Little Help needed climbing 3-5 steps with a railing? : A Lot 6 Click Score: 17    End of Session Equipment Utilized During Treatment: Back brace Activity Tolerance: Patient tolerated treatment well Patient left: in bed;with call bell/phone within reach Nurse Communication: Mobility status PT Visit Diagnosis: Other abnormalities of gait and mobility (R26.89);Pain Pain - part of body:  (Back)     Time: 1040-1050 PT Time Calculation (min) (ACUTE ONLY): 10 min  Charges:  $Therapeutic Activity: 8-22 mins                     Roney Marion, PT  Acute Rehabilitation Services Pager [phone] Office [place] 02/01/2021, 12:29 PM

## 2021-02-01 NOTE — Progress Notes (Signed)
Occupational Therapy Treatment Patient Details Name: [name] [name] MRN: [id number] DOB: 1951/04/16 Today's Date: 02/01/2021    History of present illness Pt is a 70 yo femal now s/p RIGHT L3-4 TRANSFORAMINAL LUMBAR INTERBODY FUSION.   OT comments  OT treatment session with focus on self-care re-education with adherence to back precautions and safety with AD/DME. Patient able to don lumbar brace with supervision A and verbal cues seated EOB. Education provided on use of AE to maximize safety/independence with LB ADLs. Patient able to doff/don footwear with use of reacher and sock-aid with light Min A and min verbal cues. Patient demonstrating functional transfers and short-distance mobility in hospital room with Min guard and use of RW. Patient continues to request d/c to SNF rehab reporting that she lives alone. Based on progress this date, patient could return home with PRN supervision/assist from family if available. Will continue to follow acutely.    Follow Up Recommendations  Home health OT;Supervision/Assistance - 24 hour    Equipment Recommendations  Other (comment) (Needs a rolling walker (patient already has a shower chair, toielt riser and SPC))    Recommendations for Other Services      Precautions / Restrictions Precautions Precautions: Back Precaution Booklet Issued: No Precaution Comments: Patient able to recall 3/3 back precautions; demonstrates good adherence during ADLs. Required Braces or Orthoses: Spinal Brace Spinal Brace: Applied in sitting position Restrictions Weight Bearing Restrictions: No       Mobility Bed Mobility Overal bed mobility: Needs Assistance             General bed mobility comments: Seated EOB upon entry.    Transfers Overall transfer level: Needs assistance Equipment used: Rolling walker (2 wheeled) Transfers: Sit to/from Stand Sit to Stand: Min guard         General transfer comment: Able to stand from EOB positioned in  lowest setting; cues for hand placement.    Balance Overall balance assessment: Needs assistance Sitting-balance support: No upper extremity supported;Feet supported Sitting balance-Leahy Scale: Good     Standing balance support: Bilateral upper extremity supported;During functional activity Standing balance-Leahy Scale: Fair Standing balance comment: Reliant on BUE on RW with dynamic balance.                           ADL either performed or assessed with clinical judgement   ADL Overall ADL's : Needs assistance/impaired                     Lower Body Dressing: Minimal assistance Lower Body Dressing Details (indicate cue type and reason): Able to don LB clothing with Min A and use of AE/DME. Toilet Transfer: Immunologist Details (indicate cue type and reason): Simulated with transfer to recliner with use of RW and Min guard for steadying/safety.         Functional mobility during ADLs: Health and safety inspector     Praxis      Cognition Arousal/Alertness: Awake/alert Behavior During Therapy: WFL for tasks assessed/performed Overall Cognitive Status: Within Functional Limits for tasks assessed                                 General Comments: Requires increased time to process verbal information specifically when learning new tasks.        Exercises  Shoulder Instructions       General Comments Clean/dry dressing at incision    Pertinent Vitals/ Pain       Pain Assessment: 0-10 Pain Score: 6  Pain Location: incisional Pain Descriptors / Indicators: Aching;Sore Pain Intervention(s): Limited activity within patient's tolerance;Monitored during session;Repositioned  Home Living                                          Prior Functioning/Environment              Frequency  Min 2X/week        Progress Toward Goals  OT Goals(current goals can now  be found in the care plan section)  Progress towards OT goals: Progressing toward goals  Acute Rehab OT Goals Patient Stated Goal: to go to rehab because I live alone OT Goal Formulation: With patient Time For Goal Achievement: 02/14/21 Potential to Achieve Goals: Good ADL Goals Pt Will Perform Grooming: with modified independence;standing Pt Will Perform Lower Body Bathing: with modified independence;with adaptive equipment;sit to/from stand Pt Will Perform Lower Body Dressing: with modified independence;with adaptive equipment;sit to/from stand Pt Will Transfer to Toilet: with modified independence;ambulating;bedside commode Pt Will Perform Toileting - Clothing Manipulation and hygiene: with modified independence;sit to/from stand Additional ADL Goal #1: Pt will be able to donn/doff back brace independently Additional ADL Goal #2: Pt will be Mod I in and OOB for basic ADLs (HOB flat, no rail, increased time)  Plan Discharge plan remains appropriate;Discharge plan needs to be updated    Co-evaluation                 AM-PAC OT "6 Clicks" Daily Activity     Outcome Measure   Help from another person eating meals?: None Help from another person taking care of personal grooming?: A Little Help from another person toileting, which includes using toliet, bedpan, or urinal?: A Little Help from another person bathing (including washing, rinsing, drying)?: A Little Help from another person to put on and taking off regular upper body clothing?: A Little Help from another person to put on and taking off regular lower body clothing?: A Little 6 Click Score: 19    End of Session Equipment Utilized During Treatment: Gait belt;Rolling walker  OT Visit Diagnosis: Unsteadiness on feet (R26.81);Muscle weakness (generalized) (M62.81);Pain   Activity Tolerance Patient tolerated treatment well   Patient Left in chair;with call bell/phone within reach   Nurse Communication Mobility  status;Other (comment) (Response to treatment)        Time: 0932-3557 OT Time Calculation (min): 21 min  Charges: OT General Charges $OT Visit: 1 Visit OT Treatments $Self Care/Home Management : 8-22 mins  Coleta Grosshans H. OTR/L Supplemental OT, Department of rehab services [phone]   Adya Wirz R H. 02/01/2021, 8:13 AM

## 2021-02-01 NOTE — NC FL2 (Signed)
[place] MEDICAID FL2 LEVEL OF CARE SCREENING TOOL     IDENTIFICATION  Patient Name: [name] [name] Birthdate: [birth year] Sex: female Admission Date (Current Location): 01/31/2021  [facility] and [place] Number:  Producer, television/film/video and Address:  [facility], [address], [place], [place] [address]      Provider Number: [id number]  Attending Physician Name and Address:  Kerrin Champagne, MD  Relative Name and Phone Number:       Current Level of Care:   Recommended Level of Care: Skilled Nursing Facility Prior Approval Number:    Date Approved/Denied:   PASRR Number: [id number]  Discharge Plan: SNF    Current Diagnoses: Patient Active Problem List   Diagnosis Date Noted   Fusion of spine of lumbar region 01/31/2021   S/P lumbar fusion L4 to Sacrum 06/22/09 10/11/2020   Myalgia and myositis 06/12/2018   Other and unspecified nonspecific immunological findings 04/25/2018   Osteoarthrosis, hand 10/19/2017   Insomnia 09/23/2015   Plantar fascial fibromatosis 09/23/2015   Shortness of breath 04/28/2015   Abnormal myocardial perfusion study    Disturbance of skin sensation 08/05/2014   Chronic kidney disease, stage III (moderate) (HCC) 02/02/2014   Impaired fasting glucose 02/02/2014   Vitamin D deficiency 10/30/2013   Other osteoporosis 07/29/2012   Myocardial infarction (HCC) 12/29/2010   Cardiovascular disease 05/26/2009   KNEE PAIN, LEFT 07/02/2008   HIP PAIN 04/27/2008   OBESITY 02/24/2008   HYPERGLYCEMIA, FASTING 02/10/2008   GERD 03/12/2007   ANXIETY DEPRESSION 08/28/2006   DEGENERATIVE DISC DISEASE 08/28/2006   HYPERLIPIDEMIA 05/29/2006   CARPAL TUNNEL SYNDROME 05/29/2006   HYPERTENSION 05/29/2006   Unspecified constipation 05/29/2006   IBS 05/29/2006   ARTHRITIS 05/29/2006   LOW BACK PAIN 05/29/2006    Orientation RESPIRATION BLADDER Height & Weight     Self, Time, Situation, Place  Normal Continent Weight: 232 lb 3.2 oz  (105.3 kg) Height:  5\' 7"  (170.2 cm)  BEHAVIORAL SYMPTOMS/MOOD NEUROLOGICAL BOWEL NUTRITION STATUS      Continent Diet  AMBULATORY STATUS COMMUNICATION OF NEEDS Skin   Limited Assist Verbally Normal, Surgical wounds (back)                       Personal Care Assistance Level of Assistance  Bathing, Feeding, Dressing Bathing Assistance: Limited assistance Feeding assistance: Limited assistance Dressing Assistance: Limited assistance     Functional Limitations Info  Hearing, Sight, Speech Sight Info: Adequate Hearing Info: Adequate Speech Info: Adequate    SPECIAL CARE FACTORS FREQUENCY  PT (By licensed PT), OT (By licensed OT)     PT Frequency: 5x a week OT Frequency: 5x a week            Contractures Contractures Info: Not present    Additional Factors Info  Code Status, Allergies Code Status Info: Full Allergies Info: NKA           Current Medications (02/01/2021):  This is the current hospital active medication list Current Facility-Administered Medications  Medication Dose Route Frequency Provider Last Rate Last Admin   0.9 %  sodium chloride infusion  250 mL Intravenous Continuous 04/03/2021, MD       0.9 %  sodium chloride infusion   Intravenous Continuous Kerrin Champagne, MD 100 mL/hr at 02/01/21 0841 New Bag at 02/01/21 0841   acetaminophen (TYLENOL) tablet 650 mg  650 mg Oral Q4H PRN 04/03/21, MD  Or   acetaminophen (TYLENOL) suppository 650 mg  650 mg Rectal Q4H PRN Kerrin Champagne, MD       alum & mag hydroxide-simeth (MAALOX/MYLANTA) 200-200-20 MG/5ML suspension 30 mL  30 mL Oral Q6H PRN Kerrin Champagne, MD       amLODipine (NORVASC) tablet 10 mg  10 mg Oral Daily Kerrin Champagne, MD   10 mg at 02/01/21 1149   aspirin EC tablet 81 mg  81 mg Oral Daily Kerrin Champagne, MD   81 mg at 02/01/21 1148   atorvastatin (LIPITOR) tablet 40 mg  40 mg Oral Daily Kerrin Champagne, MD   40 mg at 02/01/21 1150   bisacodyl (DULCOLAX) EC tablet 5 mg  5 mg  Oral Daily PRN Kerrin Champagne, MD       cholecalciferol (VITAMIN D3) tablet 2,000 Units  2,000 Units Oral Daily Kerrin Champagne, MD   2,000 Units at 02/01/21 1148   diclofenac Sodium (VOLTAREN) 1 % topical gel 1 application  1 application Topical Daily Kerrin Champagne, MD   1 application at 02/01/21 1152   docusate sodium (COLACE) capsule 100 mg  100 mg Oral BID Kerrin Champagne, MD   100 mg at 02/01/21 1148   famotidine (PEPCID) tablet 20 mg  20 mg Oral Daily PRN Kerrin Champagne, MD       gabapentin (NEURONTIN) capsule 300 mg  300 mg Oral TID Kerrin Champagne, MD   300 mg at 02/01/21 1150   hydrochlorothiazide (HYDRODIURIL) tablet 25 mg  25 mg Oral Daily Kerrin Champagne, MD   25 mg at 02/01/21 1149   HYDROcodone-acetaminophen (NORCO) 7.5-325 MG per tablet 1 tablet  1 tablet Oral Q6H Kerrin Champagne, MD   1 tablet at 02/01/21 1127   HYDROcodone-acetaminophen (NORCO) 7.5-325 MG per tablet 1 tablet  1 tablet Oral Q4H PRN Kerrin Champagne, MD       HYDROcodone-acetaminophen (NORCO) 7.5-325 MG per tablet 2 tablet  2 tablet Oral Q4H PRN Kerrin Champagne, MD   2 tablet at 01/31/21 1623   insulin aspart (novoLOG) injection 0-15 Units  0-15 Units Subcutaneous TID WC Kerrin Champagne, MD   2 Units at 02/01/21 0651   insulin aspart (novoLOG) injection 4 Units  4 Units Subcutaneous TID WC Kerrin Champagne, MD   4 Units at 02/01/21 0650   lisinopril (ZESTRIL) tablet 40 mg  40 mg Oral Daily Kerrin Champagne, MD   40 mg at 02/01/21 1127   menthol-cetylpyridinium (CEPACOL) lozenge 3 mg  1 lozenge Oral PRN Kerrin Champagne, MD       Or   phenol (CHLORASEPTIC) mouth spray 1 spray  1 spray Mouth/Throat PRN Kerrin Champagne, MD       methocarbamol (ROBAXIN) tablet 500 mg  500 mg Oral Q6H PRN Kerrin Champagne, MD   500 mg at 02/01/21 8546   Or   methocarbamol (ROBAXIN) 500 mg in dextrose 5 % 50 mL IVPB  500 mg Intravenous Q6H PRN Kerrin Champagne, MD       morphine 2 MG/ML injection 1 mg  1 mg Intravenous Q2H PRN Kerrin Champagne, MD        ondansetron [facility]) tablet 4 mg  4 mg Oral Q6H PRN Kerrin Champagne, MD       Or   ondansetron [facility]) injection 4 mg  4 mg Intravenous Q6H PRN Kerrin Champagne, MD  pantoprazole (PROTONIX) EC tablet 40 mg  40 mg Oral Daily Kerrin Champagne, MD   40 mg at 02/01/21 1128   polyethylene glycol (MIRALAX / GLYCOLAX) packet 17 g  17 g Oral Daily PRN Kerrin Champagne, MD       sodium chloride flush (NS) 0.9 % injection 3 mL  3 mL Intravenous Q12H Kerrin Champagne, MD   3 mL at 02/01/21 1150   sodium chloride flush (NS) 0.9 % injection 3 mL  3 mL Intravenous PRN Kerrin Champagne, MD       sodium phosphate (FLEET) 7-19 GM/118ML enema 1 enema  1 enema Rectal Once PRN Kerrin Champagne, MD         Discharge Medications: Please see discharge summary for a list of discharge medications.  Relevant Imaging Results:  Relevant Lab Results:   Additional Information AJO:[id number], covid vaccinated no booster  Jimmy Picket, LCSW

## 2021-02-01 NOTE — Progress Notes (Signed)
     Subjective: 1 Day Post-Op Procedure(s) (LRB): RIGHT L3-4 TRANSFORAMINAL LUMBAR INTERBODY FUSION WITH RODS, SCREWS, CAGE, LOCAL AND ALLOGRAFT BONE GRAFT, VIVIGEN, RIGHT L2-3 LATERAL RECESS DECOMPRESSION steroid injection of right knee (N/A) She is alert and oriented x 4. Her son and grandchild is in room. The foley is out and she is voiding well. PT saw, and care management. She is undergoing SNF placement for short term stay till she is independent in ADLs.  Patient reports pain as moderate.    Objective:   VITALS:  Temp:  [98.2 F (36.8 C)-98.9 F (37.2 C)] 98.4 F (36.9 C) (08/09 0746) Pulse Rate:  [71-87] 71 (08/09 0746) Resp:  [18-20] 18 (08/09 0746) BP: (120-137)/(60-73) 120/60 (08/09 0746) SpO2:  [97 %-100 %] 98 % (08/09 0746)  Neurologically intact ABD soft Neurovascular intact Sensation intact distally Intact pulses distally Dorsiflexion/Plantar flexion intact Incision: dressing C/D/I, no drainage, and dressing changed.   LABS Recent Labs    02/01/21 0416 02/01/21 1230  HGB 8.7* 8.9*  WBC 10.3 12.4*  PLT 152 151   Recent Labs    02/01/21 0416  NA 137  K 3.8  CL 102  CO2 25  BUN 18  CREATININE 1.09*  GLUCOSE 139*   No results for input(s): LABPT, INR in the last 72 hours.   Assessment/Plan: 1 Day Post-Op Procedure(s) (LRB): RIGHT L3-4 TRANSFORAMINAL LUMBAR INTERBODY FUSION WITH RODS, SCREWS, CAGE, LOCAL AND ALLOGRAFT BONE GRAFT, VIVIGEN, RIGHT L2-3 LATERAL RECESS DECOMPRESSION steroid injection of right knee (N/A) Anemia due to blood loss.   Advance diet Up with therapy D/C IV fluids Discharge to SNF  [name] [name] 02/01/2021, 3:22 PM Patient ID: [name] [name], female   DOB: Oct 28, 1950, 70 y.o.   MRN: [id number]

## 2021-02-02 DIAGNOSIS — M48062 Spinal stenosis, lumbar region with neurogenic claudication: Secondary | ICD-10-CM | POA: Diagnosis not present

## 2021-02-02 DIAGNOSIS — M4316 Spondylolisthesis, lumbar region: Secondary | ICD-10-CM | POA: Diagnosis not present

## 2021-02-02 DIAGNOSIS — M4326 Fusion of spine, lumbar region: Secondary | ICD-10-CM | POA: Diagnosis not present

## 2021-02-02 DIAGNOSIS — I2510 Atherosclerotic heart disease of native coronary artery without angina pectoris: Secondary | ICD-10-CM | POA: Diagnosis not present

## 2021-02-02 DIAGNOSIS — I129 Hypertensive chronic kidney disease with stage 1 through stage 4 chronic kidney disease, or unspecified chronic kidney disease: Secondary | ICD-10-CM | POA: Diagnosis not present

## 2021-02-02 DIAGNOSIS — N182 Chronic kidney disease, stage 2 (mild): Secondary | ICD-10-CM | POA: Diagnosis not present

## 2021-02-02 DIAGNOSIS — M4726 Other spondylosis with radiculopathy, lumbar region: Secondary | ICD-10-CM | POA: Diagnosis not present

## 2021-02-02 DIAGNOSIS — Z20822 Contact with and (suspected) exposure to covid-19: Secondary | ICD-10-CM | POA: Diagnosis not present

## 2021-02-02 DIAGNOSIS — E1122 Type 2 diabetes mellitus with diabetic chronic kidney disease: Secondary | ICD-10-CM | POA: Diagnosis not present

## 2021-02-02 LAB — GLUCOSE, CAPILLARY
Glucose-Capillary: 117 mg/dL — ABNORMAL HIGH (ref 70–99)
Glucose-Capillary: 87 mg/dL (ref 70–99)
Glucose-Capillary: 142 mg/dL — ABNORMAL HIGH (ref 70–99)
Glucose-Capillary: 115 mg/dL — ABNORMAL HIGH (ref 70–99)

## 2021-02-02 LAB — SARS CORONAVIRUS 2 (TAT 6-24 HRS): SARS Coronavirus 2: NEGATIVE

## 2021-02-02 MED ORDER — GABAPENTIN 300 MG PO CAPS: 300.0000 mg | ORAL_CAPSULE | Freq: Three times a day (TID) | ORAL | 3 refills | Status: DC

## 2021-02-02 MED ORDER — HYDROCODONE-ACETAMINOPHEN 7.5-325 MG PO TABS: 1.0000 | ORAL_TABLET | ORAL | 0 refills | Status: DC | PRN

## 2021-02-02 MED ORDER — DOCUSATE SODIUM 100 MG PO CAPS: 100.0000 mg | ORAL_CAPSULE | Freq: Two times a day (BID) | ORAL | 0 refills | Status: DC

## 2021-02-02 MED ORDER — METHOCARBAMOL 500 MG PO TABS: 500.0000 mg | ORAL_TABLET | Freq: Four times a day (QID) | ORAL | 1 refills | Status: DC | PRN

## 2021-02-02 MED ORDER — FERROUS GLUCONATE 324 (38 FE) MG PO TABS: 324.0000 mg | ORAL_TABLET | Freq: Two times a day (BID) | ORAL | 0 refills | Status: DC

## 2021-02-02 NOTE — Progress Notes (Signed)
Physical Therapy Treatment Patient Details Name: [name] [name] MRN: [id number] DOB: 04/27/1951 Today's Date: 02/02/2021    History of Present Illness Pt is a 70 yo femal now s/p RIGHT L3-4 TRANSFORAMINAL LUMBAR INTERBODY FUSION.    PT Comments    Continuing work on functional mobility and activity tolerance;  More sore today and slightly less conversational, but still participating well; Overall progressing well; Anticipate continuing good progress at post-acute rehabilitation.    Follow Up Recommendations  SNF;Supervision/Assistance - 24 hour     Equipment Recommendations  Rolling walker with 5" wheels;3in1 (PT)    Recommendations for Other Services       Precautions / Restrictions Precautions Precautions: Back Precaution Booklet Issued: Yes (comment) Precaution Comments: Able to recall 3/3 back prcautions; good adherence with ADLs Required Braces or Orthoses: Spinal Brace Spinal Brace: Applied in sitting position;Applied in standing position Restrictions Weight Bearing Restrictions: No    Mobility  Bed Mobility Overal bed mobility: Needs Assistance Bed Mobility: Rolling;Sidelying to Sit Rolling: Supervision Sidelying to sit: Min guard       General bed mobility comments: Min guard for supine to EOB with HOB elevated; requires increased time/effort.    Transfers Overall transfer level: Needs assistance Equipment used: Rolling walker (2 wheeled) Transfers: Sit to/from Stand Sit to Stand: Min guard         General transfer comment: Good rise with bil UE assist, pushing with both hands on armrests (no cues needed); noted grimace and incr effort when switching hands from armrests to RW; Heavily dependent on UEs to control descent to sit  Ambulation/Gait Ambulation/Gait assistance: Min guard Gait Distance (Feet): 120 Feet Assistive device: Rolling walker (2 wheeled) Gait Pattern/deviations: Step-through pattern;Decreased step length - right;Decreased step  length - left Gait velocity: quite slow   General Gait Details: Occasional cues for RW proximity, and to bear down on RW with UEs to take pressure off of back, hips, legs; noted upper back and UEs fatigued with progressive amb   Stairs             Wheelchair Mobility    Modified Rankin (Stroke Patients Only)       Balance     Sitting balance-Leahy Scale: Good       Standing balance-Leahy Scale: Fair Standing balance comment: Reliant on BUE on RW with dynamic balance.                            Cognition Arousal/Alertness: Awake/alert Behavior During Therapy: WFL for tasks assessed/performed Overall Cognitive Status: Within Functional Limits for tasks assessed                                 General Comments: Moves slowly and take incr time to answer questions      Exercises      General Comments General comments (skin integrity, edema, etc.): More pain and slightly less conversational today than yesterday, but still participating well      Pertinent Vitals/Pain Pain Assessment: 0-10 Pain Score: 6  Pain Location: incisional Pain Descriptors / Indicators: Aching;Sore Pain Intervention(s): Monitored during session    Home Living Family/patient expects to be discharged to:: Skilled nursing facility Living Arrangements: Alone   Type of Home: House Home Access: Stairs to enter   Home Layout: One level Home Equipment: Toilet riser;Cane - single point      Prior Function Level of  Independence: Independent          PT Goals (current goals can now be found in the care plan section) Acute Rehab PT Goals Patient Stated Goal: to go to rehab because I live alone PT Goal Formulation: With patient Time For Goal Achievement: 02/15/21 Potential to Achieve Goals: Good Progress towards PT goals: Progressing toward goals    Frequency    Min 3X/week      PT Plan Current plan remains appropriate    Co-evaluation               AM-PAC PT "6 Clicks" Mobility   Outcome Measure  Help needed turning from your back to your side while in a flat bed without using bedrails?: A Little Help needed moving from lying on your back to sitting on the side of a flat bed without using bedrails?: A Little Help needed moving to and from a bed to a chair (including a wheelchair)?: A Little Help needed standing up from a chair using your arms (e.g., wheelchair or bedside chair)?: A Little Help needed to walk in hospital room?: A Little Help needed climbing 3-5 steps with a railing? : A Lot 6 Click Score: 17    End of Session Equipment Utilized During Treatment: Back brace Activity Tolerance: Patient tolerated treatment well Patient left: with call bell/phone within reach (in bathroom) Nurse Communication: Mobility status;Other (comment) (and ended session with pt in bathroom) PT Visit Diagnosis: Other abnormalities of gait and mobility (R26.89);Pain Pain - part of body:  (Back)     Time: 4975-3005 PT Time Calculation (min) (ACUTE ONLY): 16 min  Charges:  $Gait Training: 8-22 mins                     Van Clines, [place]  Acute Rehabilitation Services Pager [phone] Office [phone]    Levi Aland 02/02/2021, 10:11 AM

## 2021-02-02 NOTE — TOC Progression Note (Signed)
Transition of Care [facility]) - Progression Note    Patient Details  Name: CASSADY STANCZAK MRN: [id number] Date of Birth: 07/19/1950  [facility] CM/SW Contact  Jimmy Picket, [place] Phone Number: 02/02/2021, 2:43 PM  Clinical Narrative:     CSW spoke to pt at bedside and gave bed offers. Pt stated she will go to [facility] if shes doesn't get into [facility].  CSW sopke to admissions coordinator wh stated they do not have any beds until the weekend.   CSW reached out to [facility] who stated they can accept pt when Berkley Harvey is received. CSW started [place].  CSW will follow.   Expected Discharge Plan: Skilled Nursing Facility Barriers to Discharge: Continued Medical Work up, English as a second language teacher  Expected Discharge Plan and Services Expected Discharge Plan: Skilled Nursing Facility       Living arrangements for the past 2 months: Single Family Home Expected Discharge Date: 02/02/21                                     Social Determinants of Health (SDOH) Interventions    Readmission Risk Interventions No flowsheet data found.  Jimmy Picket, Theresia Majors, [place] Clinical Social Worker [phone]

## 2021-02-02 NOTE — Progress Notes (Signed)
     Subjective: 2 Days Post-Op Procedure(s) (LRB): RIGHT L3-4 TRANSFORAMINAL LUMBAR INTERBODY FUSION WITH RODS, SCREWS, CAGE, LOCAL AND ALLOGRAFT BONE GRAFT, VIVIGEN, RIGHT L2-3 LATERAL RECESS DECOMPRESSION steroid injection of right knee (N/A) Awake, alert and oriented x 4. Dressing is curling up at lower end above buttock crease. Voiding without difficulty, trouble wiping her self post void and BM. Legs are NV normal.  No complaints. Awaiting SNF placement, plan to go to SNF near home, [facility] in [place] or [facility] in [place].    Patient reports pain as mild.    Objective:   VITALS:  Temp:  [98.2 F (36.8 C)-99.1 F (37.3 C)] 99.1 F (37.3 C) (08/10 0719) Pulse Rate:  [73-80] 75 (08/10 0719) Resp:  [18-20] 18 (08/10 0719) BP: (112-117)/(56-72) 116/64 (08/10 0719) SpO2:  [96 %-100 %] 96 % (08/10 0719)  Neurologically intact ABD soft Neurovascular intact Sensation intact distally Intact pulses distally Dorsiflexion/Plantar flexion intact Incision: dressing C/D/I and needs tape to lower incision line.    LABS Recent Labs    02/01/21 0416 02/01/21 1230  HGB 8.7* 8.9*  WBC 10.3 12.4*  PLT 152 151   Recent Labs    02/01/21 0416  NA 137  K 3.8  CL 102  CO2 25  BUN 18  CREATININE 1.09*  GLUCOSE 139*   No results for input(s): LABPT, INR in the last 72 hours.   Assessment/Plan: 2 Days Post-Op Procedure(s) (LRB): RIGHT L3-4 TRANSFORAMINAL LUMBAR INTERBODY FUSION WITH RODS, SCREWS, CAGE, LOCAL AND ALLOGRAFT BONE GRAFT, VIVIGEN, RIGHT L2-3 LATERAL RECESS DECOMPRESSION steroid injection of right knee (N/A) Anemia due to blood loss from surgery.  Advance diet Up with therapy D/C IV fluids Discharge to SNF  [name] 02/02/2021, 7:58 AM Patient ID: [name] [name], female   DOB: June 30, 1950, 70 y.o.   MRN: [id number]

## 2021-02-02 NOTE — Discharge Summary (Addendum)
Physician Discharge Summary      Patient ID: [name] [name] MRN: [id number] DOB/AGE: [birth year] 70 y.o.  Admit date: 01/31/2021 Discharge date: 02/07/2021  Admission Diagnoses:  Active Problems:   Acute postoperative anemia due to expected blood loss   Fusion of spine of lumbar region   Spinal stenosis of lumbar region with neurogenic claudication   Spondylolisthesis, lumbar region   Other spondylosis with radiculopathy, lumbar region   Discharge Diagnoses:  Same  Past Medical History:  Diagnosis Date   Arthritis    Carpal tunnel syndrome    Chronic kidney disease (CKD), stage II (mild)    Constipation    Coronary artery disease    NSTEMI 2004, DES to ostial RCA   DDD (degenerative disc disease)    Essential hypertension    GERD (gastroesophageal reflux disease)    Hyperlipidemia    IBS (irritable bowel syndrome)    Low back pain    Myocardial infarction (HCC) 2004   Postmenopausal    Pulmonary nodule    Sleep apnea    "waiting on machine to come".   Type 2 diabetes mellitus (HCC)     Surgeries: Procedure(s): RIGHT L3-4 TRANSFORAMINAL LUMBAR INTERBODY FUSION WITH RODS, SCREWS, CAGE, LOCAL AND ALLOGRAFT BONE GRAFT, VIVIGEN, RIGHT L2-3 LATERAL RECESS DECOMPRESSION steroid injection of right knee on 01/31/2021   Consultants:   Discharged Condition: Improved  Hospital Course: [name] [name] is an 70 y.o. female who was admitted 01/31/2021 with a chief complaint of lumbar spinal stenosis with neurogenic claudication right leg greater than left, above previous fusion with spondylolisthesis L3-4, right lateral recess stenosis L2-3, and found to have a diagnosis of expected diagnosis.  They were brought to the operating room on 01/31/2021 and underwent the above named procedures.    They were given perioperative antibiotics:  Anti-infectives (From admission, onward)    Start     Dose/Rate Route Frequency Ordered Stop   01/31/21 1600  ceFAZolin (ANCEF) IVPB 2g/100 mL premix         2 g 200 mL/hr over 30 Minutes Intravenous Every 8 hours 01/31/21 1447 01/31/21 2359   01/31/21 0600  ceFAZolin (ANCEF) IVPB 2g/100 mL premix        2 g 200 mL/hr over 30 Minutes Intravenous On call to O.R. 01/31/21 0551 01/31/21 1205     Recovery in the PACU uneventful, transferred to Med-Surg Floor 3 C Post PACU. Vital signs remained stable, able to stand and walk the evening of surgery. PT assess on POD#1 and she did well however she has not help at home to assist with ADLs so SNF placement was ordered. FL2 form was signed on POD#1. Hgb was low on POD #1 8.6 and repeated mid day found to be stable at 8.9. Started on ferrous gluconate. VSS and she participated with PT. Care management saw her and she decided for SNF in [place] or [place]. With SNF placement she is stable and ready for discharge.On POD#2 she was scheduled for discharge to SNF tolerating po nourishment and liquid, she is voiding normally. Care management unable to obtain insurance authorization. Patient seen on POD#3 awake, alert and oriented x 4. BS are stable. Pain is controlled with po narcotic hydrocodone and muscle relaxers, methocarbamol.Continues with PT and OT. Continue with SNF placement. POD#4, POD#5, POD#6 she was orthopaedicly stable, repeat Hgb 8.4 on Ferrous gluconate. Constipation, no BMN in nearly one week, Fleets enema. Increased stool softener. Peer to peer was requested by insurance and as of POD #7  the insurance company has not returned call concerning obtaining preauthorization for SNF placement. Patient remains stable. Replaced on oral antihyperglycemic meds. .  They were given sequential compression devices, early ambulation, and baby aspirin for DVT prophylaxis.  They benefited maximally from their hospital stay and there were no complications.    Recent vital signs:  Vitals:   02/06/21 2140 02/07/21 0802  BP: 136/67 132/80  Pulse: 74 73  Resp: 17 16  Temp: 98.5 F (36.9 C) 98.8 F (37.1 C)  SpO2:  99% 100%    Recent laboratory studies:  Results for orders placed or performed during the hospital encounter of 01/31/21  SARS CORONAVIRUS 2 (TAT 6-24 HRS) Nasopharyngeal Nasopharyngeal Swab   Specimen: Nasopharyngeal Swab  Result Value Ref Range   SARS Coronavirus 2 NEGATIVE NEGATIVE  Urinalysis, Routine w reflex microscopic  Result Value Ref Range   Color, Urine YELLOW YELLOW   APPearance CLEAR CLEAR   Specific Gravity, Urine 1.009 1.005 - 1.030   pH 5.0 5.0 - 8.0   Glucose, UA NEGATIVE NEGATIVE mg/dL   Hgb urine dipstick NEGATIVE NEGATIVE   Bilirubin Urine NEGATIVE NEGATIVE   Ketones, ur NEGATIVE NEGATIVE mg/dL   Protein, ur NEGATIVE NEGATIVE mg/dL   Nitrite NEGATIVE NEGATIVE   Leukocytes,Ua NEGATIVE NEGATIVE  Glucose, capillary  Result Value Ref Range   Glucose-Capillary 109 (H) 70 - 99 mg/dL  Glucose, capillary  Result Value Ref Range   Glucose-Capillary 152 (H) 70 - 99 mg/dL  CBC  Result Value Ref Range   WBC 10.3 4.0 - 10.5 K/uL   RBC 2.86 (L) 3.87 - 5.11 MIL/uL   Hemoglobin 8.7 (L) 12.0 - 15.0 g/dL   HCT 16.1 (L) 09.6 - 04.5 %   MCV 92.3 80.0 - 100.0 fL   MCH 30.4 26.0 - 34.0 pg   MCHC 33.0 30.0 - 36.0 g/dL   RDW 40.9 81.1 - 91.4 %   Platelets 152 150 - 400 K/uL   nRBC 0.0 0.0 - 0.2 %  Basic Metabolic Panel  Result Value Ref Range   Sodium 137 135 - 145 mmol/L   Potassium 3.8 3.5 - 5.1 mmol/L   Chloride 102 98 - 111 mmol/L   CO2 25 22 - 32 mmol/L   Glucose, Bld 139 (H) 70 - 99 mg/dL   BUN 18 8 - 23 mg/dL   Creatinine, Ser 7.82 (H) 0.44 - 1.00 mg/dL   Calcium 9.3 8.9 - 95.6 mg/dL   GFR, Estimated 54 (L) >60 mL/min   Anion gap 10 5 - 15  Glucose, capillary  Result Value Ref Range   Glucose-Capillary 186 (H) 70 - 99 mg/dL  Glucose, capillary  Result Value Ref Range   Glucose-Capillary 175 (H) 70 - 99 mg/dL   Comment 1 Notify RN    Comment 2 Document in Chart   Glucose, capillary  Result Value Ref Range   Glucose-Capillary 129 (H) 70 - 99 mg/dL    Comment 1 Notify RN    Comment 2 Document in Chart   CBC with Differential/Platelet  Result Value Ref Range   WBC 12.4 (H) 4.0 - 10.5 K/uL   RBC 2.89 (L) 3.87 - 5.11 MIL/uL   Hemoglobin 8.9 (L) 12.0 - 15.0 g/dL   HCT 21.3 (L) 08.6 - 57.8 %   MCV 93.4 80.0 - 100.0 fL   MCH 30.8 26.0 - 34.0 pg   MCHC 33.0 30.0 - 36.0 g/dL   RDW 46.9 62.9 - 52.8 %   Platelets 151 150 -  400 K/uL   nRBC 0.0 0.0 - 0.2 %   Neutrophils Relative % 80 %   Neutro Abs 10.0 (H) 1.7 - 7.7 K/uL   Lymphocytes Relative 11 %   Lymphs Abs 1.4 0.7 - 4.0 K/uL   Monocytes Relative 8 %   Monocytes Absolute 1.0 0.1 - 1.0 K/uL   Eosinophils Relative 0 %   Eosinophils Absolute 0.0 0.0 - 0.5 K/uL   Basophils Relative 0 %   Basophils Absolute 0.0 0.0 - 0.1 K/uL   Immature Granulocytes 1 %   Abs Immature Granulocytes 0.06 0.00 - 0.07 K/uL  Glucose, capillary  Result Value Ref Range   Glucose-Capillary 106 (H) 70 - 99 mg/dL  Glucose, capillary  Result Value Ref Range   Glucose-Capillary 177 (H) 70 - 99 mg/dL  Glucose, capillary  Result Value Ref Range   Glucose-Capillary 136 (H) 70 - 99 mg/dL   Comment 1 Notify RN    Comment 2 Document in Chart   Glucose, capillary  Result Value Ref Range   Glucose-Capillary 117 (H) 70 - 99 mg/dL   Comment 1 Notify RN    Comment 2 Document in Chart   Glucose, capillary  Result Value Ref Range   Glucose-Capillary 87 70 - 99 mg/dL  Glucose, capillary  Result Value Ref Range   Glucose-Capillary 142 (H) 70 - 99 mg/dL  Glucose, capillary  Result Value Ref Range   Glucose-Capillary 115 (H) 70 - 99 mg/dL   Comment 1 Notify RN    Comment 2 Document in Chart   Glucose, capillary  Result Value Ref Range   Glucose-Capillary 106 (H) 70 - 99 mg/dL   Comment 1 Notify RN    Comment 2 Document in Chart   Glucose, capillary  Result Value Ref Range   Glucose-Capillary 105 (H) 70 - 99 mg/dL   Comment 1 Notify RN    Comment 2 Document in Chart   Glucose, capillary  Result Value Ref  Range   Glucose-Capillary 110 (H) 70 - 99 mg/dL  CBC with Differential/Platelet  Result Value Ref Range   WBC 8.7 4.0 - 10.5 K/uL   RBC 2.78 (L) 3.87 - 5.11 MIL/uL   Hemoglobin 8.4 (L) 12.0 - 15.0 g/dL   HCT 16.1 (L) 09.6 - 04.5 %   MCV 93.9 80.0 - 100.0 fL   MCH 30.2 26.0 - 34.0 pg   MCHC 32.2 30.0 - 36.0 g/dL   RDW 40.9 81.1 - 91.4 %   Platelets 174 150 - 400 K/uL   nRBC 0.0 0.0 - 0.2 %   Neutrophils Relative % 50 %   Neutro Abs 4.4 1.7 - 7.7 K/uL   Lymphocytes Relative 38 %   Lymphs Abs 3.3 0.7 - 4.0 K/uL   Monocytes Relative 8 %   Monocytes Absolute 0.7 0.1 - 1.0 K/uL   Eosinophils Relative 3 %   Eosinophils Absolute 0.3 0.0 - 0.5 K/uL   Basophils Relative 1 %   Basophils Absolute 0.1 0.0 - 0.1 K/uL   Immature Granulocytes 0 %   Abs Immature Granulocytes 0.02 0.00 - 0.07 K/uL  Basic metabolic panel  Result Value Ref Range   Sodium 139 135 - 145 mmol/L   Potassium 3.5 3.5 - 5.1 mmol/L   Chloride 101 98 - 111 mmol/L   CO2 31 22 - 32 mmol/L   Glucose, Bld 110 (H) 70 - 99 mg/dL   BUN 15 8 - 23 mg/dL   Creatinine, Ser 7.82 (H) 0.44 - 1.00  mg/dL   Calcium 9.0 8.9 - 44.0 mg/dL   GFR, Estimated 56 (L) >60 mL/min   Anion gap 7 5 - 15  Glucose, capillary  Result Value Ref Range   Glucose-Capillary 144 (H) 70 - 99 mg/dL  Glucose, capillary  Result Value Ref Range   Glucose-Capillary 128 (H) 70 - 99 mg/dL  Glucose, capillary  Result Value Ref Range   Glucose-Capillary 96 70 - 99 mg/dL  Glucose, capillary  Result Value Ref Range   Glucose-Capillary 129 (H) 70 - 99 mg/dL  Glucose, capillary  Result Value Ref Range   Glucose-Capillary 111 (H) 70 - 99 mg/dL  Glucose, capillary  Result Value Ref Range   Glucose-Capillary 99 70 - 99 mg/dL  Glucose, capillary  Result Value Ref Range   Glucose-Capillary 77 70 - 99 mg/dL  Glucose, capillary  Result Value Ref Range   Glucose-Capillary 136 (H) 70 - 99 mg/dL  Glucose, capillary  Result Value Ref Range   Glucose-Capillary  97 70 - 99 mg/dL  Glucose, capillary  Result Value Ref Range   Glucose-Capillary 108 (H) 70 - 99 mg/dL  Glucose, capillary  Result Value Ref Range   Glucose-Capillary 91 70 - 99 mg/dL   Comment 1 Notify RN   Glucose, capillary  Result Value Ref Range   Glucose-Capillary 99 70 - 99 mg/dL  Glucose, capillary  Result Value Ref Range   Glucose-Capillary 126 (H) 70 - 99 mg/dL  Glucose, capillary  Result Value Ref Range   Glucose-Capillary 87 70 - 99 mg/dL  Glucose, capillary  Result Value Ref Range   Glucose-Capillary 87 70 - 99 mg/dL    Discharge Medications:   Allergies as of 02/07/2021   No Known Allergies      Medication List     STOP taking these medications    meloxicam 15 MG tablet Commonly known as: MOBIC       TAKE these medications    acetaminophen 500 MG tablet Commonly known as: TYLENOL Take 1,000 mg by mouth every 6 (six) hours as needed for moderate pain or headache.   amLODipine 10 MG tablet Commonly known as: NORVASC Take 10 mg by mouth daily.   aspirin 81 MG tablet Take 81 mg by mouth daily.   atorvastatin 40 MG tablet Commonly known as: LIPITOR Take 40 mg by mouth daily.   diclofenac Sodium 1 % Gel Commonly known as: VOLTAREN Apply 1 application topically daily.   diphenhydramine-acetaminophen 25-500 MG Tabs tablet Commonly known as: TYLENOL PM Take 2 tablets by mouth at bedtime as needed (sleep).   docusate sodium 100 MG capsule Commonly known as: COLACE Take 1 capsule (100 mg total) by mouth 2 (two) times daily.   famotidine 20 MG tablet Commonly known as: PEPCID Take 20 mg by mouth daily as needed for heartburn or indigestion.   ferrous gluconate 324 MG tablet Commonly known as: FERGON Take 1 tablet (324 mg total) by mouth 2 (two) times daily with a meal.   gabapentin 300 MG capsule Commonly known as: NEURONTIN Take 1 capsule (300 mg total) by mouth 3 (three) times daily.   hydrochlorothiazide 25 MG tablet Commonly known  as: HYDRODIURIL Take 25 mg by mouth daily.   HYDROcodone-acetaminophen 7.5-325 MG tablet Commonly known as: NORCO Take 1-2 tablets by mouth every 4 (four) hours as needed for moderate pain ((score 4 to 6)).   lisinopril 40 MG tablet Commonly known as: ZESTRIL Take 40 mg by mouth daily.   metFORMIN 500 MG tablet  Commonly known as: GLUCOPHAGE Take 500 mg by mouth 2 (two) times daily.   methocarbamol 500 MG tablet Commonly known as: ROBAXIN Take 1 tablet (500 mg total) by mouth every 6 (six) hours as needed for muscle spasms.   Vitamin D3 50 MCG (2000 UT) Tabs Take 2,000 Units by mouth daily.               Durable Medical Equipment  (From admission, onward)           Start     Ordered   01/31/21 1448  DME Walker rolling  Once       Question:  Patient needs a walker to treat with the following condition  Answer:  Spinal stenosis of lumbosacral region   01/31/21 1447   01/31/21 1448  DME 3 n 1  Once        01/31/21 1447            Diagnostic Studies: DG Chest 2 View  Result Date: 01/27/2021 CLINICAL DATA:  Preoperative chest x-ray. EXAM: CHEST - 2 VIEW COMPARISON:  06/16/2009. FINDINGS: Mediastinum hilar structures normal. Heart size stable. No focal infiltrate. No pleural effusion or pneumothorax. Mild thoracic spine scoliosis and degenerative change again noted. IMPRESSION: No acute cardiopulmonary disease. Electronically Signed   By: Maisie Fus  Register   On: 01/27/2021 04:59   DG Lumbar Spine Complete  Result Date: 01/31/2021 CLINICAL DATA:  Surgery, elective Z41.9 (ICD-10-CM). Additional history provided by technologist: Right L3-4 TLIF and right L2-3 lateral recess decompression for lumbar spondylolisthesis L3-4 above L4 to S1 fusion, and right L2-3 lateral recess stenosis. EXAM: LUMBAR SPINE - COMPLETE 4+ VIEW; DG C-ARM 1-60 MIN COMPARISON:  Lumbar spine MRI 11/01/2020. FINDINGS: AP, oblique and lateral view intraoperative fluoroscopic images of the lumbosacral  spine are submitted, 4 images total. On the provided images, a posterior spinal fusion construct now spans the L3-S1 levels. Interbody device(s) also now present at L3-L4. Redemonstrated interbody devices at L4-L5 and L5-S1. IMPRESSION: Four intraoperative fluoroscopic images of the lumbosacral spine, as described. Correlate with the operative history. Electronically Signed   By: Jackey Loge DO   On: 01/31/2021 12:58   DG C-Arm 1-60 Min  Result Date: 01/31/2021 CLINICAL DATA:  Surgery, elective Z41.9 (ICD-10-CM). Additional history provided by technologist: Right L3-4 TLIF and right L2-3 lateral recess decompression for lumbar spondylolisthesis L3-4 above L4 to S1 fusion, and right L2-3 lateral recess stenosis. EXAM: LUMBAR SPINE - COMPLETE 4+ VIEW; DG C-ARM 1-60 MIN COMPARISON:  Lumbar spine MRI 11/01/2020. FINDINGS: AP, oblique and lateral view intraoperative fluoroscopic images of the lumbosacral spine are submitted, 4 images total. On the provided images, a posterior spinal fusion construct now spans the L3-S1 levels. Interbody device(s) also now present at L3-L4. Redemonstrated interbody devices at L4-L5 and L5-S1. IMPRESSION: Four intraoperative fluoroscopic images of the lumbosacral spine, as described. Correlate with the operative history. Electronically Signed   By: Jackey Loge DO   On: 01/31/2021 12:58    Disposition: Discharge disposition: 03-Skilled Nursing Facility      Discharge Instructions     Call MD / Call 911   Complete by: As directed    If you experience chest pain or shortness of breath, CALL 911 and be transported to the hospital emergency room.  If you develope a fever above 101 F, pus (white drainage) or increased drainage or redness at the wound, or calf pain, call your surgeon's office.   Constipation Prevention   Complete by: As directed  Drink plenty of fluids.  Prune juice may be helpful.  You may use a stool softener, such as Colace (over the counter) 100 mg twice  a day.  Use MiraLax (over the counter) for constipation as needed.   Diet - low sodium heart healthy   Complete by: As directed    Discharge instructions   Complete by: As directed    Call if there is increasing drainage, fever greater than 101.5, severe head aches, and worsening nausea or light sensitivity. If shortness of breath, bloody cough or chest tightness or pain go to an emergency room. No lifting greater than 10 lbs. Avoid bending, stooping and twisting. Use brace when sitting and out of bed even to go to bathroom. Walk in house for first 2 weeks then may start to get out slowly increasing distances up to one mile by 4-6 weeks post op. After 5 days may shower and change dressing following bathing with shower.When bathing remove the brace shower and replace brace before getting out of the shower. If drainage, keep dry dressing and do not bathe the incision, use an moisture impervious dressing. Please call and return for scheduled follow up appointment 2 weeks from the time of surgery.   Driving restrictions   Complete by: As directed    No driving for 6 weeks   Increase activity slowly as tolerated   Complete by: As directed    Lifting restrictions   Complete by: As directed    No lifting for 6 weeks   Post-operative opioid taper instructions:   Complete by: As directed    POST-OPERATIVE OPIOID TAPER INSTRUCTIONS: It is important to wean off of your opioid medication as soon as possible. If you do not need pain medication after your surgery it is ok to stop day one. Opioids include: Codeine, Hydrocodone(Norco, Vicodin), Oxycodone(Percocet, oxycontin) and hydromorphone amongst others.  Long term and even short term use of opiods can cause: Increased pain response Dependence Constipation Depression Respiratory depression And more.  Withdrawal symptoms can include Flu like symptoms Nausea, vomiting And more Techniques to manage these symptoms Hydrate well Eat regular  healthy meals Stay active Use relaxation techniques(deep breathing, meditating, yoga) Do Not substitute Alcohol to help with tapering If you have been on opioids for less than two weeks and do not have pain than it is ok to stop all together.  Plan to wean off of opioids This plan should start within one week post op of your joint replacement. Maintain the same interval or time between taking each dose and first decrease the dose.  Cut the total daily intake of opioids by one tablet each day Next start to increase the time between doses. The last dose that should be eliminated is the evening dose.           Follow-up Information     Kerrin ChampagneNitka, Jurell Basista E, MD Follow up in 2 week(s).   Specialty: Orthopedic Surgery Why: For wound re-check, For suture removal Contact information: [address] [place] [address] [phone]                  Signed: Vira BrownsJames Troy Kanouse 02/07/2021, 12:46 PM

## 2021-02-02 NOTE — Progress Notes (Signed)
Occupational Therapy Treatment Patient Details Name: [name] [name] MRN: [id number] DOB: 08/10/1950 Today's Date: 02/02/2021    History of present illness Pt is a 70 yo femal now s/p RIGHT L3-4 TRANSFORAMINAL LUMBAR INTERBODY FUSION.   OT comments  OT session with focus on self-care re-education with adherence to spinal precautions. Patient reports completing UB bathing/dressing at sink level this a.m. without external assist. Patient also reports bathing front perineal area and buttocks in standing with use of RW endorsing mild unsteadiness. During OT session patient given long-handled sponge to wash legs/feet requiring Min A to dry and lotion feet only. Assist to don TED hose but patient able to don socks with use of sock-aid and reacher with Min A. Patient would benefit from continued acute OT services in prep for safe d/c to next level of care.    Follow Up Recommendations  Home health OT;Supervision/Assistance - 24 hour;SNF    Equipment Recommendations  Other (comment) (Needs a rolling walker (patient already has a shower chair, toielt riser and SPC))    Recommendations for Other Services      Precautions / Restrictions Precautions Precautions: Back Precaution Booklet Issued: Yes (comment) Precaution Comments: Able to recall 3/3 back prcautions; good adherence with ADLs Required Braces or Orthoses: Spinal Brace Spinal Brace: Applied in sitting position;Applied in standing position Restrictions Weight Bearing Restrictions: No       Mobility Bed Mobility Overal bed mobility: Needs Assistance Bed Mobility: Rolling;Sidelying to Sit Rolling: Supervision Sidelying to sit: Min guard       General bed mobility comments: Min guard for supine to EOB with HOB elevated; requires increased time/effort.    Transfers Overall transfer level: Needs assistance Equipment used: Rolling walker (2 wheeled) Transfers: Sit to/from Stand Sit to Stand: Min guard;Supervision          General transfer comment: Sit to stand from slightly elevated EOB with supervision A. Min gaurd from low commode in bathroom with use of grab bar.    Balance     Sitting balance-Leahy Scale: Good       Standing balance-Leahy Scale: Fair Standing balance comment: Reliant on BUE on RW with dynamic balance.                           ADL either performed or assessed with clinical judgement   ADL Overall ADL's : Needs assistance/impaired             Lower Body Bathing: Minimal assistance Lower Body Bathing Details (indicate cue type and reason): Min A with use of AE/DME seated on standard height commode.     Lower Body Dressing: Minimal assistance Lower Body Dressing Details (indicate cue type and reason): Able to don LB clothing with Min A and use of AE/DME. Increased assist to don TED hose. Toilet Transfer: Immunologist Details (indicate cue type and reason): To standard height commode in bathroom with use of grab bar; demos good walker management. Toileting- Architect and Hygiene: Min guard Toileting - Clothing Manipulation Details (indicate cue type and reason): 3/3 toileting tasks with Min guard.     Functional mobility during ADLs: Rolling walker;Supervision/safety;Min guard       Vision       Perception     Praxis      Cognition Arousal/Alertness: Awake/alert Behavior During Therapy: WFL for tasks assessed/performed Overall Cognitive Status: Within Functional Limits for tasks assessed  General Comments: Requires increased time to process verbal information specifically when learning new tasks.        Exercises     Shoulder Instructions       General Comments      Pertinent Vitals/ Pain       Pain Assessment: 0-10 Pain Score: 6  (Increased to 8/10 with LB bathing/dressing) Pain Location: incisional Pain Descriptors / Indicators: Aching;Sore Pain Intervention(s):  Limited activity within patient's tolerance;Monitored during session;Repositioned  Home Living Family/patient expects to be discharged to:: Skilled nursing facility Living Arrangements: Alone   Type of Home: House Home Access: Stairs to enter Secretary/administrator of Steps: 1   Home Layout: One level     Bathroom Shower/Tub: IT trainer: Handicapped height     Home Equipment: Surveyor, minerals - single point          Prior Functioning/Environment Level of Independence: Independent            Frequency  Min 2X/week        Progress Toward Goals  OT Goals(current goals can now be found in the care plan section)  Progress towards OT goals: Progressing toward goals  Acute Rehab OT Goals Patient Stated Goal: to go to rehab because I live alone OT Goal Formulation: With patient Time For Goal Achievement: 02/14/21 Potential to Achieve Goals: Good ADL Goals Pt Will Perform Grooming: with modified independence;standing Pt Will Perform Lower Body Bathing: with modified independence;with adaptive equipment;sit to/from stand Pt Will Perform Lower Body Dressing: with modified independence;with adaptive equipment;sit to/from stand Pt Will Transfer to Toilet: with modified independence;ambulating;bedside commode Pt Will Perform Toileting - Clothing Manipulation and hygiene: with modified independence;sit to/from stand Additional ADL Goal #1: Pt will be able to donn/doff back brace independently Additional ADL Goal #2: Pt will be Mod I in and OOB for basic ADLs (HOB flat, no rail, increased time)  Plan Discharge plan remains appropriate;Discharge plan needs to be updated    Co-evaluation                 AM-PAC OT "6 Clicks" Daily Activity     Outcome Measure   Help from another person eating meals?: None Help from another person taking care of personal grooming?: A Little Help from another person toileting, which includes using  toliet, bedpan, or urinal?: A Little Help from another person bathing (including washing, rinsing, drying)?: A Little Help from another person to put on and taking off regular upper body clothing?: A Little Help from another person to put on and taking off regular lower body clothing?: A Little 6 Click Score: 19    End of Session Equipment Utilized During Treatment: Gait belt;Rolling walker  OT Visit Diagnosis: Unsteadiness on feet (R26.81);Muscle weakness (generalized) (M62.81);Pain   Activity Tolerance Patient tolerated treatment well   Patient Left in chair;with call bell/phone within reach   Nurse Communication Mobility status;Other (comment) (Response to treatment)        Time: 1308-6578 OT Time Calculation (min): 29 min  Charges: OT General Charges $OT Visit: 1 Visit OT Treatments $Self Care/Home Management : 23-37 mins  Van Seymore H. OTR/L Supplemental OT, Department of rehab services [phone]   Joal Eakle R H. 02/02/2021, 8:25 AM

## 2021-02-03 DIAGNOSIS — I129 Hypertensive chronic kidney disease with stage 1 through stage 4 chronic kidney disease, or unspecified chronic kidney disease: Secondary | ICD-10-CM | POA: Diagnosis not present

## 2021-02-03 DIAGNOSIS — M4726 Other spondylosis with radiculopathy, lumbar region: Secondary | ICD-10-CM | POA: Diagnosis not present

## 2021-02-03 DIAGNOSIS — M4316 Spondylolisthesis, lumbar region: Secondary | ICD-10-CM | POA: Diagnosis not present

## 2021-02-03 DIAGNOSIS — M4326 Fusion of spine, lumbar region: Secondary | ICD-10-CM | POA: Diagnosis not present

## 2021-02-03 DIAGNOSIS — N182 Chronic kidney disease, stage 2 (mild): Secondary | ICD-10-CM | POA: Diagnosis not present

## 2021-02-03 DIAGNOSIS — E1122 Type 2 diabetes mellitus with diabetic chronic kidney disease: Secondary | ICD-10-CM | POA: Diagnosis not present

## 2021-02-03 DIAGNOSIS — Z20822 Contact with and (suspected) exposure to covid-19: Secondary | ICD-10-CM | POA: Diagnosis not present

## 2021-02-03 DIAGNOSIS — I2510 Atherosclerotic heart disease of native coronary artery without angina pectoris: Secondary | ICD-10-CM | POA: Diagnosis not present

## 2021-02-03 DIAGNOSIS — M48062 Spinal stenosis, lumbar region with neurogenic claudication: Secondary | ICD-10-CM | POA: Diagnosis not present

## 2021-02-03 LAB — GLUCOSE, CAPILLARY
Glucose-Capillary: 106 mg/dL — ABNORMAL HIGH (ref 70–99)
Glucose-Capillary: 105 mg/dL — ABNORMAL HIGH (ref 70–99)
Glucose-Capillary: 110 mg/dL — ABNORMAL HIGH (ref 70–99)
Glucose-Capillary: 144 mg/dL — ABNORMAL HIGH (ref 70–99)

## 2021-02-03 NOTE — Progress Notes (Signed)
     Subjective: 3 Days Post-Op Procedure(s) (LRB): RIGHT L3-4 TRANSFORAMINAL LUMBAR INTERBODY FUSION WITH RODS, SCREWS, CAGE, LOCAL AND ALLOGRAFT BONE GRAFT, VIVIGEN, RIGHT L2-3 LATERAL RECESS DECOMPRESSION steroid injection of right knee (N/A) Awake, alert and oriented x 4. Awaits SNF placement, no help at home. Voiding without difficulty, unable to wipe for hygiene. PT and OT seeing while in hospital acutely. Ferrous gluconate for anemia.  Patient reports pain as moderate.    Objective:   VITALS:  Temp:  [98.3 F (36.8 C)-99.6 F (37.6 C)] 98.6 F (37 C) (08/11 1728) Pulse Rate:  [71-86] 74 (08/11 1728) Resp:  [16-20] 17 (08/11 1728) BP: (110-132)/(62-89) 132/71 (08/11 1728) SpO2:  [93 %-100 %] 100 % (08/11 1728)  Neurologically intact ABD soft Neurovascular intact Sensation intact distally Intact pulses distally Dorsiflexion/Plantar flexion intact Incision: dressing C/D/I and no drainage No cellulitis present   LABS Recent Labs    02/01/21 0416 02/01/21 1230  HGB 8.7* 8.9*  WBC 10.3 12.4*  PLT 152 151   Recent Labs    02/01/21 0416  NA 137  K 3.8  CL 102  CO2 25  BUN 18  CREATININE 1.09*  GLUCOSE 139*   No results for input(s): LABPT, INR in the last 72 hours.   Assessment/Plan: 3 Days Post-Op Procedure(s) (LRB): RIGHT L3-4 TRANSFORAMINAL LUMBAR INTERBODY FUSION WITH RODS, SCREWS, CAGE, LOCAL AND ALLOGRAFT BONE GRAFT, VIVIGEN, RIGHT L2-3 LATERAL RECESS DECOMPRESSION steroid injection of right knee (N/A) Anemia post op due to expected blood loss.  Advance diet Up with therapy D/C IV fluids Discharge to SNF  [name] [name] 02/03/2021, 5:29 PM Patient ID: [name] [name], female   DOB: 09/30/1950, 70 y.o.   MRN: [id number]

## 2021-02-03 NOTE — Progress Notes (Signed)
Physical Therapy Treatment Patient Details Name: [name] [name] MRN: [id number] DOB: [birth year] Today's Date: 02/03/2021    History of Present Illness Pt is a 70 yo female now s/p RIGHT L3-4 TRANSFORAMINAL LUMBAR INTERBODY FUSION.    PT Comments    Pt ambulatory for hallway distance today, requiring mod safety cues and intermittent rest. Pt progressing well, would still benefit from ST-SNF to maximize safety and mobility prior to d/c home alone. Will continue to follow.     Follow Up Recommendations  SNF;Supervision/Assistance - 24 hour     Equipment Recommendations  Rolling walker with 5" wheels;3in1 (PT)    Recommendations for Other Services       Precautions / Restrictions Precautions Precautions: Back Precaution Booklet Issued: Yes (comment) Precaution Comments: Able to recall 3/3 back prcautions Required Braces or Orthoses: Spinal Brace Spinal Brace: Applied in sitting position;Applied in standing position    Mobility  Bed Mobility Overal bed mobility: Needs Assistance Bed Mobility: Supine to Sit;Sit to Supine     Supine to sit: Min assist Sit to supine: Min assist   General bed mobility comments: min assist for LE management, scooting to/from EOB. Increased time.    Transfers Overall transfer level: Needs assistance Equipment used: Rolling walker (2 wheeled) Transfers: Sit to/from Stand Sit to Stand: Min guard;From elevated surface         General transfer comment: for safety, slow to rise and requires elevated bed height  Ambulation/Gait Ambulation/Gait assistance: Min guard Gait Distance (Feet): 120 Feet (x2) Assistive device: Rolling walker (2 wheeled) Gait Pattern/deviations: Step-through pattern;Decreased stride length;Trunk flexed;Decreased dorsiflexion - right Gait velocity: decr   General Gait Details: Intermittent standing rest breaks, cues for upright posture, placement in RW, increasing R foot clearance.   Stairs              Wheelchair Mobility    Modified Rankin (Stroke Patients Only)       Balance Overall balance assessment: Needs assistance   Sitting balance-Leahy Scale: Good       Standing balance-Leahy Scale: Fair Standing balance comment: Reliant on BUE on RW with dynamic balance                            Cognition Arousal/Alertness: Awake/alert Behavior During Therapy: WFL for tasks assessed/performed Overall Cognitive Status: Within Functional Limits for tasks assessed                                        Exercises      General Comments        Pertinent Vitals/Pain Pain Assessment: Faces Faces Pain Scale: Hurts little more Pain Location: incisional Pain Descriptors / Indicators: Aching;Sore Pain Intervention(s): Limited activity within patient's tolerance;Monitored during session;Repositioned    Home Living                      Prior Function            PT Goals (current goals can now be found in the care plan section) Acute Rehab PT Goals Patient Stated Goal: to go to rehab because I live alone PT Goal Formulation: With patient Time For Goal Achievement: 02/15/21 Potential to Achieve Goals: Good Progress towards PT goals: Progressing toward goals    Frequency    Min 3X/week      PT Plan Current plan remains  appropriate    Co-evaluation              AM-PAC PT "6 Clicks" Mobility   Outcome Measure  Help needed turning from your back to your side while in a flat bed without using bedrails?: A Little Help needed moving from lying on your back to sitting on the side of a flat bed without using bedrails?: A Little Help needed moving to and from a bed to a chair (including a wheelchair)?: A Little Help needed standing up from a chair using your arms (e.g., wheelchair or bedside chair)?: A Little Help needed to walk in hospital room?: A Little Help needed climbing 3-5 steps with a railing? : A Lot 6 Click Score:  17    End of Session Equipment Utilized During Treatment: Back brace Activity Tolerance: Patient tolerated treatment well Patient left: with call bell/phone within reach;in bed Nurse Communication: Mobility status PT Visit Diagnosis: Other abnormalities of gait and mobility (R26.89);Pain Pain - part of body:  (Back)     Time: 6767-2094 PT Time Calculation (min) (ACUTE ONLY): 21 min  Charges:  $Gait Training: 8-22 mins                     Marye Round, PT DPT Acute Rehabilitation Services Pager [phone]  Office [phone]    Tyrone Apple E Christain [place] 02/03/2021, 10:37 AM

## 2021-02-03 NOTE — TOC Progression Note (Addendum)
Transition of Care [facility]) - Progression Note    Patient Details  Name: [name] [name] MRN: [id number] Date of Birth: 1951/05/26  [facility]) CM/SW Contact  Lorri Frederick, LCSW Phone Number: 02/03/2021, 10:15 AM  Clinical Narrative:   CSW confirmed with Elease Hashimoto at [facility] that they do not have any beds available.  Message left with Debbie at [facility] to inquire as to status of insurance authorization.   1155: CSW called [facility]--Debbie in admissions went home sick today, no information on insurance auth.  Nobody covering admissions currently.    1300: CSW spoke to [facility] and was informed that the SNF authorization request is still pending approval.  [id number].  Direct number to auth department: [phone].    Expected Discharge Plan: Skilled Nursing Facility Barriers to Discharge: Continued Medical Work up, English as a second language teacher  Expected Discharge Plan and Services Expected Discharge Plan: Skilled Nursing Facility       Living arrangements for the past 2 months: Single Family Home Expected Discharge Date: 02/02/21                                     Social Determinants of Health (SDOH) Interventions    Readmission Risk Interventions No flowsheet data found.

## 2021-02-03 NOTE — Progress Notes (Signed)
Patient to transfer to 5N11. Report given to RN. Patient in no signs of distress at this time. Will follow up.

## 2021-02-04 DIAGNOSIS — M4326 Fusion of spine, lumbar region: Secondary | ICD-10-CM | POA: Diagnosis not present

## 2021-02-04 DIAGNOSIS — M4726 Other spondylosis with radiculopathy, lumbar region: Secondary | ICD-10-CM

## 2021-02-04 DIAGNOSIS — M48062 Spinal stenosis, lumbar region with neurogenic claudication: Secondary | ICD-10-CM

## 2021-02-04 DIAGNOSIS — I2510 Atherosclerotic heart disease of native coronary artery without angina pectoris: Secondary | ICD-10-CM | POA: Diagnosis not present

## 2021-02-04 DIAGNOSIS — N182 Chronic kidney disease, stage 2 (mild): Secondary | ICD-10-CM | POA: Diagnosis not present

## 2021-02-04 DIAGNOSIS — M4316 Spondylolisthesis, lumbar region: Secondary | ICD-10-CM | POA: Diagnosis not present

## 2021-02-04 DIAGNOSIS — I129 Hypertensive chronic kidney disease with stage 1 through stage 4 chronic kidney disease, or unspecified chronic kidney disease: Secondary | ICD-10-CM | POA: Diagnosis not present

## 2021-02-04 DIAGNOSIS — E1122 Type 2 diabetes mellitus with diabetic chronic kidney disease: Secondary | ICD-10-CM | POA: Diagnosis not present

## 2021-02-04 DIAGNOSIS — Z20822 Contact with and (suspected) exposure to covid-19: Secondary | ICD-10-CM | POA: Diagnosis not present

## 2021-02-04 LAB — CBC WITH DIFFERENTIAL/PLATELET
Abs Immature Granulocytes: 0.02 10*3/uL (ref 0.00–0.07)
Basophils Absolute: 0.1 10*3/uL (ref 0.0–0.1)
Basophils Relative: 1 %
Eosinophils Absolute: 0.3 10*3/uL (ref 0.0–0.5)
Eosinophils Relative: 3 %
HCT: 26.1 % — ABNORMAL LOW (ref 36.0–46.0)
Hemoglobin: 8.4 g/dL — ABNORMAL LOW (ref 12.0–15.0)
Immature Granulocytes: 0 %
Lymphocytes Relative: 38 %
Lymphs Abs: 3.3 10*3/uL (ref 0.7–4.0)
MCH: 30.2 pg (ref 26.0–34.0)
MCHC: 32.2 g/dL (ref 30.0–36.0)
MCV: 93.9 fL (ref 80.0–100.0)
Monocytes Absolute: 0.7 10*3/uL (ref 0.1–1.0)
Monocytes Relative: 8 %
Neutro Abs: 4.4 10*3/uL (ref 1.7–7.7)
Neutrophils Relative %: 50 %
Platelets: 174 10*3/uL (ref 150–400)
RBC: 2.78 MIL/uL — ABNORMAL LOW (ref 3.87–5.11)
RDW: 13.4 % (ref 11.5–15.5)
WBC: 8.7 10*3/uL (ref 4.0–10.5)
nRBC: 0 % (ref 0.0–0.2)

## 2021-02-04 LAB — BASIC METABOLIC PANEL
Anion gap: 7 (ref 5–15)
BUN: 15 mg/dL (ref 8–23)
CO2: 31 mmol/L (ref 22–32)
Calcium: 9 mg/dL (ref 8.9–10.3)
Chloride: 101 mmol/L (ref 98–111)
Creatinine, Ser: 1.06 mg/dL — ABNORMAL HIGH (ref 0.44–1.00)
GFR, Estimated: 56 mL/min — ABNORMAL LOW (ref >60)
Glucose, Bld: 110 mg/dL — ABNORMAL HIGH (ref 70–99)
Potassium: 3.5 mmol/L (ref 3.5–5.1)
Sodium: 139 mmol/L (ref 135–145)

## 2021-02-04 LAB — GLUCOSE, CAPILLARY
Glucose-Capillary: 128 mg/dL — ABNORMAL HIGH (ref 70–99)
Glucose-Capillary: 96 mg/dL (ref 70–99)
Glucose-Capillary: 129 mg/dL — ABNORMAL HIGH (ref 70–99)
Glucose-Capillary: 111 mg/dL — ABNORMAL HIGH (ref 70–99)

## 2021-02-04 NOTE — TOC Progression Note (Signed)
Transition of Care [facility]) - Progression Note    Patient Details  Name: [name] [name] MRN: [id number] Date of Birth: 12-26-1950  Transition of Care [facility]) CM/SW Contact  Lorri Frederick, LCSW Phone Number: 02/04/2021, 8:15 AM  Clinical Narrative:   CSW received phone call from [place] at [facility].  [employer] is offering peer to peer review on this SNF authorization.  Must be completed by 4pm today, Friday by calling [phone], [phone].    Expected Discharge Plan: Skilled Nursing Facility Barriers to Discharge: Continued Medical Work up, English as a second language teacher  Expected Discharge Plan and Services Expected Discharge Plan: Skilled Nursing Facility       Living arrangements for the past 2 months: Single Family Home Expected Discharge Date: 02/04/21                                     Social Determinants of Health (SDOH) Interventions    Readmission Risk Interventions No flowsheet data found.

## 2021-02-04 NOTE — TOC Progression Note (Addendum)
Transition of Care [facility]) - Progression Note    Patient Details  Name: [name] [name] MRN: [id number] Date of Birth: March 03, 1951  Transition of Care [facility]) CM/SW Contact  Ralene Bathe, LCSWA Phone Number: 02/04/2021, 12:44 PM  Clinical Narrative:    08:35-  CSW spoke with the patient's previous CSW, Tammy Sours, and was informed that a peer to peer is needed today by 4pm.  08:42-  CSW contacted Dr. Otelia Sergeant and gave the information needed to complete the peer to peer.    Dr. Otelia Sergeant called for the peer to peer and was informed that he would receive a call back sometime today or Monday.  Expected Discharge Plan: Skilled Nursing Facility Barriers to Discharge: Continued Medical Work up, English as a second language teacher  Expected Discharge Plan and Services Expected Discharge Plan: Skilled Nursing Facility       Living arrangements for the past 2 months: Single Family Home Expected Discharge Date: 02/04/21                                     Social Determinants of Health (SDOH) Interventions    Readmission Risk Interventions No flowsheet data found.

## 2021-02-05 DIAGNOSIS — M4726 Other spondylosis with radiculopathy, lumbar region: Secondary | ICD-10-CM | POA: Diagnosis not present

## 2021-02-05 DIAGNOSIS — M4326 Fusion of spine, lumbar region: Secondary | ICD-10-CM | POA: Diagnosis not present

## 2021-02-05 DIAGNOSIS — M4316 Spondylolisthesis, lumbar region: Secondary | ICD-10-CM | POA: Diagnosis not present

## 2021-02-05 DIAGNOSIS — I129 Hypertensive chronic kidney disease with stage 1 through stage 4 chronic kidney disease, or unspecified chronic kidney disease: Secondary | ICD-10-CM | POA: Diagnosis not present

## 2021-02-05 DIAGNOSIS — M48062 Spinal stenosis, lumbar region with neurogenic claudication: Secondary | ICD-10-CM | POA: Diagnosis not present

## 2021-02-05 DIAGNOSIS — E1122 Type 2 diabetes mellitus with diabetic chronic kidney disease: Secondary | ICD-10-CM | POA: Diagnosis not present

## 2021-02-05 DIAGNOSIS — Z20822 Contact with and (suspected) exposure to covid-19: Secondary | ICD-10-CM | POA: Diagnosis not present

## 2021-02-05 DIAGNOSIS — N182 Chronic kidney disease, stage 2 (mild): Secondary | ICD-10-CM | POA: Diagnosis not present

## 2021-02-05 DIAGNOSIS — I2510 Atherosclerotic heart disease of native coronary artery without angina pectoris: Secondary | ICD-10-CM | POA: Diagnosis not present

## 2021-02-05 LAB — GLUCOSE, CAPILLARY
Glucose-Capillary: 99 mg/dL (ref 70–99)
Glucose-Capillary: 77 mg/dL (ref 70–99)
Glucose-Capillary: 136 mg/dL — ABNORMAL HIGH (ref 70–99)
Glucose-Capillary: 97 mg/dL (ref 70–99)

## 2021-02-05 MED ORDER — METFORMIN HCL 500 MG PO TABS
500.0000 mg | ORAL_TABLET | Freq: Two times a day (BID) | ORAL | Status: DC
  Administered 2021-02-05 – 2021-02-08 (×6): 500 mg via ORAL
  Filled 2021-02-05 (×6): qty 1

## 2021-02-05 NOTE — Progress Notes (Signed)
     Subjective: 5 Days Post-Op Procedure(s) (LRB): RIGHT L3-4 TRANSFORAMINAL LUMBAR INTERBODY FUSION WITH RODS, SCREWS, CAGE, LOCAL AND ALLOGRAFT BONE GRAFT, VIVIGEN, RIGHT L2-3 LATERAL RECESS DECOMPRESSION steroid injection of right knee (N/A) Sleeping, awakens easily, alert and oriented x 4. Voiding well. Reports she normally uses 2-3 ducolax tablets po at a time to get bowels to work Patient reports pain as moderate.    Objective:   VITALS:  Temp:  [98.4 F (36.9 C)-98.9 F (37.2 C)] 98.9 F (37.2 C) (08/13 0750) Pulse Rate:  [70-75] 70 (08/13 0750) Resp:  [16] 16 (08/13 0750) BP: (119-130)/(67-68) 119/68 (08/13 0750) SpO2:  [95 %-98 %] 95 % (08/13 0750)  Neurologically intact ABD soft Neurovascular intact Sensation intact distally Intact pulses distally Dorsiflexion/Plantar flexion intact Incision: dressing C/D/I   LABS Recent Labs    02/04/21 0313  HGB 8.4*  WBC 8.7  PLT 174   Recent Labs    02/04/21 0313  NA 139  K 3.5  CL 101  CO2 31  BUN 15  CREATININE 1.06*  GLUCOSE 110*   No results for input(s): LABPT, INR in the last 72 hours.   Assessment/Plan: 5 Days Post-Op Procedure(s) (LRB): RIGHT L3-4 TRANSFORAMINAL LUMBAR INTERBODY FUSION WITH RODS, SCREWS, CAGE, LOCAL AND ALLOGRAFT BONE GRAFT, VIVIGEN, RIGHT L2-3 LATERAL RECESS DECOMPRESSION steroid injection of right knee (N/A) Anemia due to blood loss  Advance diet Up with therapy D/C IV fluids Discharge to SNF She is awaiting insurance approval. Insurance company was contacted for a peer to peer phone call and I was told that Hospital doctor would call be 5 PM but may be Monday before peer to peer could be done, the peer MD with her insurance Dr. Thana Farr, no call back so I believe peer to peer will occur on Monday. Patient does not believe she is able to perform  ADLs without assistance and lives alone.   [name] 02/05/2021, 11:58 AM Patient ID: [name] [name], female   DOB:  [birth year], 70 y.o.   MRN: [id number]

## 2021-02-05 NOTE — Plan of Care (Signed)
  Problem: Activity: Goal: Ability to avoid complications of mobility impairment will improve Outcome: Progressing   Problem: Pain Management: Goal: Pain level will decrease Outcome: Progressing   

## 2021-02-05 NOTE — Progress Notes (Signed)
Occupational Therapy Treatment Patient Details Name: [name] [name] MRN: [id number] DOB: [birth year] Today's Date: 02/05/2021    History of present illness Pt is a 70 yo female now s/p RIGHT L3-4 TRANSFORAMINAL LUMBAR INTERBODY FUSION.   OT comments  Pt. Seen for skilled OT treatment session.  Remains motivated and agreeable to participation.  Requires cues to maintain back precautions during functional mobility and adl completion.  Reports she is still not feeling confident with her balance during ambulation and standing tasks.  Pt. Continues to state she would want short term snf prior to home.    Follow Up Recommendations  Home health OT;Supervision/Assistance - 24 hour;SNF    Equipment Recommendations  Other (comment)    Recommendations for Other Services      Precautions / Restrictions Precautions Precautions: Back Precaution Comments: Able to recall 3/3 back prcautions Required Braces or Orthoses: Spinal Brace Spinal Brace: Applied in sitting position       Mobility Bed Mobility Overal bed mobility: Needs Assistance Bed Mobility: Rolling;Sidelying to Sit Rolling: Supervision Sidelying to sit: Min guard       General bed mobility comments: cues for log roll tech. not twisting    Transfers Overall transfer level: Needs assistance Equipment used: Rolling walker (2 wheeled) Transfers: Sit to/from [employer] Sit to Stand: Min guard;From elevated surface Stand pivot transfers: Min guard       General transfer comment: for safety, slow to rise and requires elevated bed height    Balance                                           ADL either performed or assessed with clinical judgement   ADL Overall ADL's : Needs assistance/impaired     Grooming: Wash/dry hands;Standing;Minimal assistance           Upper Body Dressing : Set up;Sitting Upper Body Dressing Details (indicate cue type and reason): brace Lower Body  Dressing: Maximal assistance;Sitting/lateral leans Lower Body Dressing Details (indicate cue type and reason): unable to don without a/e. reports she uses her spc to don undergarments but would need sock aide for socks. Toilet Transfer: Designer, multimedia and Hygiene: Min Quarry manager Details (indicate cue type and reason): cues for no twisting     Functional mobility during ADLs: Min guard;Rolling walker General ADL Comments: states she still remains un comfortable with her balance.  requires some cues to maintain not twising during observed adls     Vision       Perception     Praxis      Cognition Arousal/Alertness: Awake/alert Behavior During Therapy: WFL for tasks assessed/performed Overall Cognitive Status: Within Functional Limits for tasks assessed                                 General Comments: says she loves to talk because she is alone often.  very friendly and joking appropriately        Exercises     Shoulder Instructions       General Comments      Pertinent Vitals/ Pain       Pain Assessment: No/denies pain  Home Living  Prior Functioning/Environment              Frequency  Min 2X/week        Progress Toward Goals  OT Goals(current goals can now be found in the care plan section)  Progress towards OT goals: Progressing toward goals     Plan Discharge plan remains appropriate;Discharge plan needs to be updated    Co-evaluation                 AM-PAC OT "6 Clicks" Daily Activity     Outcome Measure   Help from another person eating meals?: None Help from another person taking care of personal grooming?: A Little Help from another person toileting, which includes using toliet, bedpan, or urinal?: A Little Help from another person bathing (including washing, rinsing, drying)?:  A Little Help from another person to put on and taking off regular upper body clothing?: A Little Help from another person to put on and taking off regular lower body clothing?: A Little 6 Click Score: 19    End of Session Equipment Utilized During Treatment: Gait belt;Rolling walker;Back brace  OT Visit Diagnosis: Unsteadiness on feet (R26.81);Muscle weakness (generalized) (M62.81);Pain   Activity Tolerance Patient tolerated treatment well   Patient Left in chair;with call bell/phone within reach   Nurse Communication Other (comment) (spoke with rn as pt. requesting apple juice and neeed to confirm diet allowances)        Time: 2426-8341 OT Time Calculation (min): 13 min  Charges: OT General Charges $OT Visit: 1 Visit OT Treatments $Self Care/Home Management : 8-22 mins  [name] [name], COTA/L Acute Rehabilitation [phone]    [name] [name] 02/05/2021, 2:22 PM

## 2021-02-06 DIAGNOSIS — M4316 Spondylolisthesis, lumbar region: Secondary | ICD-10-CM | POA: Diagnosis not present

## 2021-02-06 DIAGNOSIS — I129 Hypertensive chronic kidney disease with stage 1 through stage 4 chronic kidney disease, or unspecified chronic kidney disease: Secondary | ICD-10-CM | POA: Diagnosis not present

## 2021-02-06 DIAGNOSIS — M4726 Other spondylosis with radiculopathy, lumbar region: Secondary | ICD-10-CM | POA: Diagnosis not present

## 2021-02-06 DIAGNOSIS — N182 Chronic kidney disease, stage 2 (mild): Secondary | ICD-10-CM | POA: Diagnosis not present

## 2021-02-06 DIAGNOSIS — I2510 Atherosclerotic heart disease of native coronary artery without angina pectoris: Secondary | ICD-10-CM | POA: Diagnosis not present

## 2021-02-06 DIAGNOSIS — E1122 Type 2 diabetes mellitus with diabetic chronic kidney disease: Secondary | ICD-10-CM | POA: Diagnosis not present

## 2021-02-06 DIAGNOSIS — M4326 Fusion of spine, lumbar region: Secondary | ICD-10-CM | POA: Diagnosis not present

## 2021-02-06 DIAGNOSIS — Z20822 Contact with and (suspected) exposure to covid-19: Secondary | ICD-10-CM | POA: Diagnosis not present

## 2021-02-06 DIAGNOSIS — M48062 Spinal stenosis, lumbar region with neurogenic claudication: Secondary | ICD-10-CM | POA: Diagnosis not present

## 2021-02-06 LAB — GLUCOSE, CAPILLARY
Glucose-Capillary: 108 mg/dL — ABNORMAL HIGH (ref 70–99)
Glucose-Capillary: 91 mg/dL (ref 70–99)
Glucose-Capillary: 99 mg/dL (ref 70–99)
Glucose-Capillary: 126 mg/dL — ABNORMAL HIGH (ref 70–99)

## 2021-02-06 NOTE — Progress Notes (Addendum)
Physical Therapy Treatment Patient Details Name: [name] [name] MRN: [id number] DOB: [birth year] Today's Date: 02/06/2021    History of Present Illness Pt is a 70 yo female admitted on 01/31/21 with dx of spinal stenosis and LLE radiculopathy after failed conservative measures she has presented for surgery.  Pt s/p R L3-4 Lumbar Fusion on 01/31/21.  NIO:[id number],GHWEXH tunnel syndrome CKD II, CAD, NSTEMI (2004), DDD. Essential HTN, GERD, HLD, IBS, LBP, MI ( 2004), sleep apnea, DM2.    PT Comments    Pt able to progress to gt training with [facility] for hallway distances.  She does present with weakness and instability and continues to require short term rehab to improve function before returning home.  Pt's frequency updated to 5x week per policy based on her diagnosis.  Will inform supervising PT of need for change in recommendations.      Follow Up Recommendations  SNF;Supervision/Assistance - 24 hour     Equipment Recommendations  Rolling walker with 5" wheels;3in1 (PT)    Recommendations for Other Services       Precautions / Restrictions Precautions Precautions: Back Precaution Booklet Issued: Yes (comment) Precaution Comments: Able to recall 3/3 back prcautions Required Braces or Orthoses: Spinal Brace Spinal Brace: Applied in standing position    Mobility  Bed Mobility               General bed mobility comments: Pt seated in bathroom on commode on arrival.    Transfers Overall transfer level: Needs assistance Equipment used: Straight cane Transfers: Sit to/from Stand Sit to Stand: Supervision         General transfer comment: Cues for safety.  Pt slow moving but no LOB noted.  Ambulation/Gait Ambulation/Gait assistance: Supervision Gait Distance (Feet): 80 Feet Assistive device: Straight cane Gait Pattern/deviations: Step-through pattern;Decreased stride length;Trunk flexed;Decreased dorsiflexion - right Gait velocity: decr   General Gait Details: Pt  required height of SPC to be lowered to improve posture.  Mild unsteadiness but no overt LOB.  pt fatigues quickly with short distances.   Stairs             Wheelchair Mobility    Modified Rankin (Stroke Patients Only)       Balance Overall balance assessment: Needs assistance Sitting-balance support: No upper extremity supported;Feet supported Sitting balance-Leahy Scale: Good       Standing balance-Leahy Scale: Fair Standing balance comment: with unilateral support.                            Cognition Arousal/Alertness: Awake/alert Behavior During Therapy: WFL for tasks assessed/performed Overall Cognitive Status: Within Functional Limits for tasks assessed                                        Exercises      General Comments        Pertinent Vitals/Pain Pain Assessment: 0-10 Faces Pain Scale: Hurts little more Pain Location: incisional Pain Descriptors / Indicators: Aching;Sore Pain Intervention(s): Monitored during session;Repositioned    Home Living                      Prior Function            PT Goals (current goals can now be found in the care plan section) Acute Rehab PT Goals Patient Stated Goal: to go to rehab  because I live alone Potential to Achieve Goals: Good Progress towards PT goals: Progressing toward goals    Frequency    Min 5X/week      PT Plan Frequency needs to be updated    Co-evaluation              AM-PAC PT "6 Clicks" Mobility   Outcome Measure  Help needed turning from your back to your side while in a flat bed without using bedrails?: A Little Help needed moving from lying on your back to sitting on the side of a flat bed without using bedrails?: A Little Help needed moving to and from a bed to a chair (including a wheelchair)?: A Little Help needed standing up from a chair using your arms (e.g., wheelchair or bedside chair)?: A Little Help needed to walk in  hospital room?: A Little Help needed climbing 3-5 steps with a railing? : A Lot 6 Click Score: 17    End of Session Equipment Utilized During Treatment: Back brace Activity Tolerance: Patient tolerated treatment well Patient left: with call bell/phone within reach;in bed Nurse Communication: Mobility status PT Visit Diagnosis: Other abnormalities of gait and mobility (R26.89);Pain Pain - part of body:  (back)     Time: 8938-1017 PT Time Calculation (min) (ACUTE ONLY): 16 min  Charges:  $Gait Training: 8-22 mins                     Bonney Leitz , PTA Acute Rehabilitation Services Pager [phone] Office [phone]    Ranell Finelli Artis Delay 02/06/2021, 4:34 PM

## 2021-02-06 NOTE — Plan of Care (Signed)
  Problem: Activity: Goal: Ability to avoid complications of mobility impairment will improve Outcome: Progressing Goal: Ability to tolerate increased activity will improve Outcome: Progressing Goal: Will remain free from falls Outcome: Progressing   Problem: Clinical Measurements: Goal: Ability to maintain clinical measurements within normal limits will improve Outcome: Progressing

## 2021-02-06 NOTE — Progress Notes (Signed)
Patient stable.  Vital stable.  Reports that her pain is well controlled.  She is using her back brace and ambulatory around the room with a cane.  Denies any radicular pain, fevers, chills, night sweats, chest pain, shortness breath, calf pain.  Main complaint is that she does not like the food and she would like to eat some [employer] today.  Family member will bring this in and this is okay.  Postop dressing was observed and shows no gross blood or drainage.  No calf tenderness bilaterally.  Plan for peer to peer tomorrow by Dr. Otelia Sergeant so that patient will have insurance I will have insurance authorization to go to skilled nursing.

## 2021-02-07 DIAGNOSIS — N182 Chronic kidney disease, stage 2 (mild): Secondary | ICD-10-CM | POA: Diagnosis not present

## 2021-02-07 DIAGNOSIS — M4326 Fusion of spine, lumbar region: Secondary | ICD-10-CM | POA: Diagnosis not present

## 2021-02-07 DIAGNOSIS — I2510 Atherosclerotic heart disease of native coronary artery without angina pectoris: Secondary | ICD-10-CM | POA: Diagnosis not present

## 2021-02-07 DIAGNOSIS — Z20822 Contact with and (suspected) exposure to covid-19: Secondary | ICD-10-CM | POA: Diagnosis not present

## 2021-02-07 DIAGNOSIS — M48062 Spinal stenosis, lumbar region with neurogenic claudication: Secondary | ICD-10-CM | POA: Diagnosis not present

## 2021-02-07 DIAGNOSIS — E1122 Type 2 diabetes mellitus with diabetic chronic kidney disease: Secondary | ICD-10-CM | POA: Diagnosis not present

## 2021-02-07 DIAGNOSIS — M4726 Other spondylosis with radiculopathy, lumbar region: Secondary | ICD-10-CM | POA: Diagnosis not present

## 2021-02-07 DIAGNOSIS — I129 Hypertensive chronic kidney disease with stage 1 through stage 4 chronic kidney disease, or unspecified chronic kidney disease: Secondary | ICD-10-CM | POA: Diagnosis not present

## 2021-02-07 DIAGNOSIS — M4316 Spondylolisthesis, lumbar region: Secondary | ICD-10-CM | POA: Diagnosis not present

## 2021-02-07 LAB — GLUCOSE, CAPILLARY
Glucose-Capillary: 87 mg/dL (ref 70–99)
Glucose-Capillary: 87 mg/dL (ref 70–99)
Glucose-Capillary: 110 mg/dL — ABNORMAL HIGH (ref 70–99)
Glucose-Capillary: 114 mg/dL — ABNORMAL HIGH (ref 70–99)

## 2021-02-07 MED ORDER — FLEET ENEMA 7-19 GM/118ML RE ENEM: 1.0000 | ENEMA | Freq: Every day | RECTAL | Status: DC | PRN

## 2021-02-07 MED ORDER — METHOCARBAMOL 500 MG PO TABS
500.0000 mg | ORAL_TABLET | Freq: Four times a day (QID) | ORAL | 1 refills | Status: DC | PRN
Start: 2021-02-07 — End: 2021-12-06

## 2021-02-07 MED ORDER — DOCUSATE SODIUM 100 MG PO CAPS: 100.0000 mg | ORAL_CAPSULE | Freq: Two times a day (BID) | ORAL | 0 refills | Status: DC

## 2021-02-07 MED ORDER — BISACODYL 5 MG PO TBEC: 10.0000 mg | DELAYED_RELEASE_TABLET | Freq: Every day | ORAL | Status: DC | PRN

## 2021-02-07 MED ORDER — FERROUS GLUCONATE 324 (38 FE) MG PO TABS
324.0000 mg | ORAL_TABLET | Freq: Two times a day (BID) | ORAL | 0 refills | Status: DC
Start: 2021-02-07 — End: 2021-03-03

## 2021-02-07 MED ORDER — HYDROCODONE-ACETAMINOPHEN 7.5-325 MG PO TABS: 1.0000 | ORAL_TABLET | ORAL | 0 refills | Status: DC | PRN

## 2021-02-07 NOTE — Progress Notes (Signed)
     Subjective: 7 Days Post-Op Procedure(s) (LRB): RIGHT L3-4 TRANSFORAMINAL LUMBAR INTERBODY FUSION WITH RODS, SCREWS, CAGE, LOCAL AND ALLOGRAFT BONE GRAFT, VIVIGEN, RIGHT L2-3 LATERAL RECESS DECOMPRESSION steroid injection of right knee (N/A) Awake, alert, no BM since surgery one week ago.  Patient reports pain as moderate.    Objective:   VITALS:  Temp:  [98.3 F (36.8 C)-98.8 F (37.1 C)] 98.8 F (37.1 C) (08/15 0802) Pulse Rate:  [69-74] 73 (08/15 0802) Resp:  [16-17] 16 (08/15 0802) BP: (111-136)/(61-80) 132/80 (08/15 0802) SpO2:  [98 %-100 %] 100 % (08/15 0802)  Neurologically intact ABD soft Neurovascular intact Sensation intact distally Intact pulses distally Dorsiflexion/Plantar flexion intact Incision: dressing C/D/I and no drainage   LABS No results for input(s): HGB, WBC, PLT in the last 72 hours. No results for input(s): NA, K, CL, CO2, BUN, CREATININE, GLUCOSE in the last 72 hours. No results for input(s): LABPT, INR in the last 72 hours.   Assessment/Plan: 7 Days Post-Op Procedure(s) (LRB): RIGHT L3-4 TRANSFORAMINAL LUMBAR INTERBODY FUSION WITH RODS, SCREWS, CAGE, LOCAL AND ALLOGRAFT BONE GRAFT, VIVIGEN, RIGHT L2-3 LATERAL RECESS DECOMPRESSION steroid injection of right knee (N/A) Anemia due to  blood loss. Hgb 8.4   Advance diet Up with therapy D/C IV fluids Discharge to SNF  [name] 02/07/2021, 12:14 PM Patient ID: [name] [name], female   DOB: [birth year], 70 y.o.   MRN: [id number]

## 2021-02-07 NOTE — Plan of Care (Signed)
  Problem: Activity: Goal: Ability to avoid complications of mobility impairment will improve Outcome: Progressing Goal: Ability to tolerate increased activity will improve Outcome: Progressing Goal: Will remain free from falls Outcome: Progressing   

## 2021-02-07 NOTE — Progress Notes (Signed)
Patient ID: [name] [name], female   DOB: 01-01-1951, 70 y.o.   MRN: [id number] Insurance, Dr. Thana Farr with [facility] will not approve for SNF but will recommend that [facility] provide assistance at home during  the day when her family is working and not able to help.  She is orthopaedically stable and can go home with assistance during the day and family assisting when they come home from work. [place] normal. Anemia. She has one stair to get into her home.

## 2021-02-07 NOTE — Progress Notes (Signed)
Occupational Therapy Treatment Patient Details Name: [name] [name] MRN: [id number] DOB: 09-01-1950 Today's Date: 02/07/2021    History of present illness Pt is a 70 yo female admitted on 01/31/21 with dx of spinal stenosis and LLE radiculopathy after failed conservative measures she has presented for surgery.  Pt s/p R L3-4 Lumbar Fusion on 01/31/21.  [id number],KVQQVZ tunnel syndrome CKD II, CAD, NSTEMI (2004), DDD. Essential HTN, GERD, HLD, IBS, LBP, MI ( 2004), sleep apnea, DM2.   OT comments  [name] [name] is progressing very well with great safety and awareness of back precautions. Pt was able to complete all BADLs with supervision-min guard A with SPC or external support of the sink. She also completed all functional mobility and transfers with [facility] given supervision A. Pt continues to benefit from OT acutely. D/c recommendation is appropriate.     Follow Up Recommendations  Home health OT;Supervision/Assistance - 24 hour;SNF    Equipment Recommendations  Other (comment) (defer to SNF)       Precautions / Restrictions Precautions Precautions: Back Precaution Booklet Issued: No Precaution Comments: Able to recall 3/3 back prcautions Required Braces or Orthoses: Spinal Brace Spinal Brace: Applied in standing position Restrictions Weight Bearing Restrictions: No       Mobility Bed Mobility Overal bed mobility: Needs Assistance             General bed mobility comments: pt sitting in chair upon arrival    Transfers Overall transfer level: Needs assistance Equipment used: Straight cane Transfers: Sit to/from Stand Sit to Stand: Supervision              Balance Overall balance assessment: Needs assistance Sitting-balance support: No upper extremity supported;Feet supported Sitting balance-Leahy Scale: Good     Standing balance support: Single extremity supported;During functional activity Standing balance-Leahy Scale: Fair           ADL either performed or  assessed with clinical judgement   ADL       Grooming: Wash/dry hands;Wash/dry face;Applying deodorant;Oral care;Supervision/safety;Sitting Grooming Details (indicate cue type and reason): sitting at sink Upper Body Bathing: Set up;Sitting Upper Body Bathing Details (indicate cue type and reason): sitting at sink Lower Body Bathing: Min guard;Cueing for compensatory techniques;Cueing for back precautions;Sit to/from stand Lower Body Bathing Details (indicate cue type and reason): sitting at sink, vc for back precautions for distal LB Upper Body Dressing : Set up;Sitting   Lower Body Dressing: Min guard;Sit to/from stand Lower Body Dressing Details (indicate cue type and reason): vc for compensatory techniques to maintain back precautions Toilet Transfer: Supervision/safety;Regular Toilet;Grab bars [facility])   Toileting- Clothing Manipulation and Hygiene: Supervision/safety;Sit to/from stand Toileting - Clothing Manipulation Details (indicate cue type and reason): maintained back precautions well     Functional mobility during ADLs: Supervision/safety (SPC) General ADL Comments: Pt completed all ADLs while sitting/standing at the sink      Cognition Arousal/Alertness: Awake/alert Behavior During Therapy: WFL for tasks assessed/performed Overall Cognitive Status: Within Functional Limits for tasks assessed                                                General Comments No new concerns    Pertinent Vitals/ Pain       Pain Assessment: Faces Faces Pain Scale: Hurts a little bit Pain Location: incisional Pain Descriptors / Indicators: Aching;Sore Pain Intervention(s): Monitored during session  Frequency  Min 2X/week        Progress Toward Goals  OT Goals(current goals can now be found in the care plan section)  Progress towards OT goals: Progressing toward goals  Acute Rehab OT Goals Patient Stated Goal: to go to rehab because I live alone OT Goal  Formulation: With patient Time For Goal Achievement: 02/14/21 Potential to Achieve Goals: Good ADL Goals Pt Will Perform Grooming: with modified independence;standing Pt Will Perform Lower Body Bathing: with modified independence;with adaptive equipment;sit to/from stand Pt Will Perform Lower Body Dressing: with modified independence;with adaptive equipment;sit to/from stand Pt Will Transfer to Toilet: with modified independence;ambulating;bedside commode Pt Will Perform Toileting - Clothing Manipulation and hygiene: with modified independence;sit to/from stand Additional ADL Goal #1: Pt will be able to donn/doff back brace independently Additional ADL Goal #2: Pt will be Mod I in and OOB for basic ADLs (HOB flat, no rail, increased time)  Plan Discharge plan remains appropriate;Discharge plan needs to be updated       AM-PAC OT "6 Clicks" Daily Activity     Outcome Measure   Help from another person eating meals?: None Help from another person taking care of personal grooming?: A Little Help from another person toileting, which includes using toliet, bedpan, or urinal?: A Little Help from another person bathing (including washing, rinsing, drying)?: A Little Help from another person to put on and taking off regular upper body clothing?: A Little Help from another person to put on and taking off regular lower body clothing?: A Little 6 Click Score: 19    End of Session Equipment Utilized During Treatment: Gait belt (SPC)  OT Visit Diagnosis: Unsteadiness on feet (R26.81);Muscle weakness (generalized) (M62.81);Pain   Activity Tolerance Patient tolerated treatment well   Patient Left in bed;with call bell/phone within reach   Nurse Communication Mobility status        Time: 6237-6283 OT Time Calculation (min): 35 min  Charges: OT General Charges $OT Visit: 1 Visit OT Treatments $Self Care/Home Management : 23-37 mins    [name] [name] 02/07/2021, 9:23 AM

## 2021-02-07 NOTE — TOC Progression Note (Signed)
Transition of Care [facility]) - Progression Note    Patient Details  Name: HAVANAH NELMS MRN: [id number] Date of Birth: [birth year]  [facility] CM/SW Contact  Ralene Bathe, LCSWA Phone Number: 02/07/2021, 4:00 PM  Clinical Narrative:     CSW notified by attending that Insurance denied SNF placement after the peer to peer and recommended contacting a home health agency to assist with patient when family members are working.  RNCM notified.  MD is in agreement with the patient going home with assistance during the day.    Expected Discharge Plan and Services Home with home health       Living arrangements for the past 2 months: Single Family Home Expected Discharge Date: 02/04/21                                     Social Determinants of Health (SDOH) Interventions    Readmission Risk Interventions No flowsheet data found.

## 2021-02-07 NOTE — Progress Notes (Signed)
Physical Therapy Treatment Patient Details Name: [name] [name] MRN: [id number] DOB: Aug 22, 1950 Today's Date: 02/07/2021    History of Present Illness Pt is a 70 yo female admitted on 01/31/21 with dx of spinal stenosis and LLE radiculopathy after failed conservative measures she has presented for surgery.  Pt s/p R L3-4 Lumbar Fusion on 01/31/21.  XQJ:[id number],KGYJEH tunnel syndrome CKD II, CAD, NSTEMI (2004), DDD. Essential HTN, GERD, HLD, IBS, LBP, MI ( 2004), sleep apnea, DM2.    PT Comments    Pt supine in bed sleeping.  Pt agreeable to PT session and focused on bed mobility, brace application with minor cues and progressing gt distance.  Continues to use SPC and mild fatigue noted.  Plan for SNF later this pm.      Follow Up Recommendations  SNF;Supervision/Assistance - 24 hour     Equipment Recommendations  Rolling walker with 5" wheels;3in1 (PT)    Recommendations for Other Services       Precautions / Restrictions Precautions Precautions: Back Precaution Booklet Issued: No Precaution Comments: Able to recall 3/3 back prcautions Required Braces or Orthoses: Spinal Brace Spinal Brace: Applied in standing position;Lumbar corset Restrictions Weight Bearing Restrictions: No    Mobility  Bed Mobility Overal bed mobility: Needs Assistance Bed Mobility: Rolling;Sidelying to Sit Rolling: Supervision Sidelying to sit: Supervision       General bed mobility comments: Increased time and effort and cues to avoid twisting motion when rolling into sidelying.    Transfers Overall transfer level: Needs assistance Equipment used: Straight cane Transfers: Sit to/from Stand Sit to Stand: Supervision         General transfer comment: Cues for safety.  Pt slow moving but no LOB noted.  Ambulation/Gait Ambulation/Gait assistance: Supervision Gait Distance (Feet): 90 Feet Assistive device: Straight cane Gait Pattern/deviations: Step-through pattern;Decreased stride  length;Trunk flexed;Decreased dorsiflexion - right Gait velocity: decr   General Gait Details: Pt with poor foor clearance this session when wearing her bed room slippers ( no back), educated wearing a shoe with a back for hopsital socks would be best to improve foot clearance.   Stairs             Wheelchair Mobility    Modified Rankin (Stroke Patients Only)       Balance Overall balance assessment: Needs assistance Sitting-balance support: No upper extremity supported;Feet supported Sitting balance-Leahy Scale: Good       Standing balance-Leahy Scale: Fair Standing balance comment: with unilateral support.                            Cognition Arousal/Alertness: Awake/alert Behavior During Therapy: WFL for tasks assessed/performed Overall Cognitive Status: Within Functional Limits for tasks assessed                                        Exercises      General Comments        Pertinent Vitals/Pain Pain Assessment: Faces Faces Pain Scale: Hurts even more Pain Location: incisional Pain Descriptors / Indicators: Aching;Sore Pain Intervention(s): Monitored during session;Repositioned    Home Living                      Prior Function            PT Goals (current goals can now be found in the care plan section)  Acute Rehab PT Goals Patient Stated Goal: to go to rehab because I live alone Potential to Achieve Goals: Good Progress towards PT goals: Progressing toward goals    Frequency    Min 5X/week      PT Plan Frequency needs to be updated    Co-evaluation              AM-PAC PT "6 Clicks" Mobility   Outcome Measure  Help needed turning from your back to your side while in a flat bed without using bedrails?: A Little Help needed moving from lying on your back to sitting on the side of a flat bed without using bedrails?: A Little Help needed moving to and from a bed to a chair (including a  wheelchair)?: A Little Help needed standing up from a chair using your arms (e.g., wheelchair or bedside chair)?: A Little Help needed to walk in hospital room?: A Little Help needed climbing 3-5 steps with a railing? : A Lot 6 Click Score: 17    End of Session Equipment Utilized During Treatment: Gait belt;Back brace Activity Tolerance: Patient tolerated treatment well Patient left: with call bell/phone within reach;in bed;with chair alarm set Nurse Communication: Mobility status PT Visit Diagnosis: Other abnormalities of gait and mobility (R26.89);Pain Pain - part of body:  (back)     Time: 4196-2229 PT Time Calculation (min) (ACUTE ONLY): 13 min  Charges:  $Gait Training: 8-22 mins                     Bonney Leitz , PTA Acute Rehabilitation Services Pager [phone] Office [phone]    Tamim Skog Artis Delay 02/07/2021, 1:39 PM

## 2021-02-08 DIAGNOSIS — M4326 Fusion of spine, lumbar region: Secondary | ICD-10-CM | POA: Diagnosis not present

## 2021-02-08 DIAGNOSIS — I129 Hypertensive chronic kidney disease with stage 1 through stage 4 chronic kidney disease, or unspecified chronic kidney disease: Secondary | ICD-10-CM | POA: Diagnosis not present

## 2021-02-08 DIAGNOSIS — I2510 Atherosclerotic heart disease of native coronary artery without angina pectoris: Secondary | ICD-10-CM | POA: Diagnosis not present

## 2021-02-08 DIAGNOSIS — M4726 Other spondylosis with radiculopathy, lumbar region: Secondary | ICD-10-CM | POA: Diagnosis not present

## 2021-02-08 DIAGNOSIS — N182 Chronic kidney disease, stage 2 (mild): Secondary | ICD-10-CM | POA: Diagnosis not present

## 2021-02-08 DIAGNOSIS — Z20822 Contact with and (suspected) exposure to covid-19: Secondary | ICD-10-CM | POA: Diagnosis not present

## 2021-02-08 DIAGNOSIS — M4316 Spondylolisthesis, lumbar region: Secondary | ICD-10-CM | POA: Diagnosis not present

## 2021-02-08 DIAGNOSIS — M48062 Spinal stenosis, lumbar region with neurogenic claudication: Secondary | ICD-10-CM | POA: Diagnosis not present

## 2021-02-08 DIAGNOSIS — E1122 Type 2 diabetes mellitus with diabetic chronic kidney disease: Secondary | ICD-10-CM | POA: Diagnosis not present

## 2021-02-08 LAB — GLUCOSE, CAPILLARY
Glucose-Capillary: 88 mg/dL (ref 70–99)
Glucose-Capillary: 106 mg/dL — ABNORMAL HIGH (ref 70–99)

## 2021-02-08 NOTE — Progress Notes (Deleted)
Pt had difficulty passing BM in the middle of night. Pt encouraged PO liquids, finally had a hard bowel movement. Pt already on PO stool softeners. Was able to get an order for miralax to aid in BM. Pt took medicine and had a bowel movement and stated she still had more, encouraged to finish miralax to aid in relief of self. [name] [name] H.[name] [name] 02/08/21 1:50 AM 

## 2021-02-08 NOTE — Progress Notes (Signed)
     Subjective: 8 Days Post-Op Procedure(s) (LRB): RIGHT L3-4 TRANSFORAMINAL LUMBAR INTERBODY FUSION WITH RODS, SCREWS, CAGE, LOCAL AND ALLOGRAFT BONE GRAFT, VIVIGEN, RIGHT L2-3 LATERAL RECESS DECOMPRESSION steroid injection of right knee (N/A) Awake, alert and oriented x 4. Insurance has denied SNF coverage and patient has been able to demonstrate ability to stand, walk and transition lying to standing. She does not have coverage for SNF and no longer needing to be in an acute facility. She is orthopaedically and medically stable for discharge home. I have explained to her that I believe she will do well with [facility] for assistance and by OT and PT accounts not necessarily requiring a great deal of therapy but assistance.  Meds sent to her pharmacy in [place]. Told that she is discharged home today.  Patient reports pain as moderate.    Objective:   VITALS:  Temp:  [98 F (36.7 C)-98.6 F (37 C)] 98 F (36.7 C) (08/16 0600) Pulse Rate:  [65-74] 65 (08/16 0600) Resp:  [18] 18 (08/16 0600) BP: (129-137)/(65-67) 137/67 (08/16 0600) SpO2:  [97 %-99 %] 97 % (08/16 0600)  Neurologically intact ABD soft Neurovascular intact Sensation intact distally Intact pulses distally Dorsiflexion/Plantar flexion intact Incision: dressing C/D/I and no drainage   LABS No results for input(s): HGB, WBC, PLT in the last 72 hours. No results for input(s): NA, K, CL, CO2, BUN, CREATININE, GLUCOSE in the last 72 hours. No results for input(s): LABPT, INR in the last 72 hours.   Assessment/Plan: 8 Days Post-Op Procedure(s) (LRB): RIGHT L3-4 TRANSFORAMINAL LUMBAR INTERBODY FUSION WITH RODS, SCREWS, CAGE, LOCAL AND ALLOGRAFT BONE GRAFT, VIVIGEN, RIGHT L2-3 LATERAL RECESS DECOMPRESSION steroid injection of right knee (N/A) Anemia due to perioperative blood loss.  Advance diet Up with therapy D/C IV fluids Discharge home with home health  [name] 02/08/2021, 8:10 AM Patient ID: [name] [name], female    DOB: 11-Dec-1950, 70 y.o.   MRN: [id number]

## 2021-02-08 NOTE — Progress Notes (Signed)
Patient refused morning lab draw, states "the doctor told me I was going home, so it wasn't any need in it." This nurse explained the importance of knowing lab values prior to discharge, patient verbalized understanding but continued to refused. Will continue to monitor.

## 2021-02-08 NOTE — Progress Notes (Addendum)
Physical Therapy Treatment Patient Details Name: [name] [name] MRN: [id number] DOB: 1950/10/16 Today's Date: 02/08/2021    History of Present Illness Pt is a 70 yo female admitted on 01/31/21 with dx of spinal stenosis and LLE radiculopathy after failed conservative measures she has presented for surgery.  Pt s/p R L3-4 Lumbar Fusion on 01/31/21.  KPT:WSFKCLEXN,TZGYFV tunnel syndrome CKD II, CAD, NSTEMI (2004), DDD. Essential HTN, GERD, HLD, IBS, LBP, MI ( 2004), sleep apnea, DM2.    PT Comments    Pt seated in recliner.  Pt educated on brace application and able to teach back method to PTA.  Performed progression of gt training and trialed x 1 stair to practice entry into her home.  She remains to benefit from SNF placement as she is home alone and still requires supervision.  However, insurance has denied placement and therefore she will need HHPT.     Follow Up Recommendations  SNF;Supervision/Assistance - 24 hour     Equipment Recommendations  Rolling walker with 5" wheels;3in1 (PT)    Recommendations for Other Services       Precautions / Restrictions Precautions Precautions: Back Precaution Booklet Issued: No Precaution Comments: Able to recall 3/3 back prcautions Required Braces or Orthoses: Spinal Brace Spinal Brace: Applied in standing position;Lumbar corset Restrictions Weight Bearing Restrictions: No    Mobility  Bed Mobility               General bed mobility comments: Seated in recliner this session.    Transfers Overall transfer level: Needs assistance Equipment used: Straight cane Transfers: Sit to/from Stand Sit to Stand: Supervision         General transfer comment: Cues for safety.  Pt slow moving but no LOB noted.  Ambulation/Gait Ambulation/Gait assistance: Supervision Gait Distance (Feet): 120 Feet Assistive device: Straight cane Gait Pattern/deviations: Step-through pattern;Decreased stride length;Trunk flexed;Decreased dorsiflexion -  right     General Gait Details: Used non skid socks today and foot clearance much improved.  Gt speed remains slow and guarded.  She does have a RW at home and we discussed using when she is alone as it provides more assistance and stability.   Stairs Stairs: Yes Stairs assistance: Min assist Stair Management: One rail Left;Forwards;Backwards;With cane Number of Stairs: 1 General stair comments: Forward to go up and backwards to go down cues for single point cane placement.   Wheelchair Mobility    Modified Rankin (Stroke Patients Only)       Balance Overall balance assessment: Needs assistance Sitting-balance support: No upper extremity supported;Feet supported Sitting balance-Leahy Scale: Good       Standing balance-Leahy Scale: Fair                              Cognition Arousal/Alertness: Awake/alert Behavior During Therapy: WFL for tasks assessed/performed Overall Cognitive Status: Within Functional Limits for tasks assessed                                        Exercises      General Comments        Pertinent Vitals/Pain Pain Assessment: Faces Faces Pain Scale: Hurts even more Pain Location: incisional Pain Descriptors / Indicators: Aching;Sore Pain Intervention(s): Monitored during session;Repositioned    Home Living  Prior Function            PT Goals (current goals can now be found in the care plan section) Acute Rehab PT Goals Patient Stated Goal: to go to rehab because I live alone Potential to Achieve Goals: Good Progress towards PT goals: Progressing toward goals    Frequency    Min 5X/week      PT Plan Frequency needs to be updated    Co-evaluation              AM-PAC PT "6 Clicks" Mobility   Outcome Measure  Help needed turning from your back to your side while in a flat bed without using bedrails?: A Little Help needed moving from lying on your back to  sitting on the side of a flat bed without using bedrails?: A Little Help needed moving to and from a bed to a chair (including a wheelchair)?: A Little Help needed standing up from a chair using your arms (e.g., wheelchair or bedside chair)?: A Little Help needed to walk in hospital room?: A Little Help needed climbing 3-5 steps with a railing? : A Lot 6 Click Score: 17    End of Session Equipment Utilized During Treatment: Gait belt;Back brace Activity Tolerance: Patient tolerated treatment well Patient left: with call bell/phone within reach;in bed;with chair alarm set Nurse Communication: Mobility status PT Visit Diagnosis: Other abnormalities of gait and mobility (R26.89);Pain Pain - part of body:  (back)     Time: 1029-1050 PT Time Calculation (min) (ACUTE ONLY): 21 min  Charges:  $Gait Training: 8-22 mins                     Bonney Leitz , PTA Acute Rehabilitation Services Pager [phone] Office [phone]    Lashann Hagg Artis Delay 02/08/2021, 11:08 AM

## 2021-02-08 NOTE — TOC Transition Note (Signed)
Transition of Care [facility]) - CM/SW Discharge Note   Patient Details  Name: [name] [name] MRN: [id number] Date of Birth: 1951-02-26  Transition of Care [facility]) CM/SW Contact:  Epifanio Lesches, RN Phone Number: 02/08/2021, 2:16 PM   Clinical Narrative:    Patient will DC to: home Anticipated DC date:02/08/2021 Family notified: yes Transport by: car      - s/p R L3-4 lumbar fusion, 8/8 Pt with PEER TO PEER insurance denial for SNF placement. Per MD patient ready for DC today. RN, patient, patient's family, and [facility] aware of DC. Pt from home alone , however,states  family to provide assistance and supervision needed @ d/c. Pt without DME needs. Declined rolling walker and 3in1/BSC. Pt without Rx med concerns. Post hospital f/u  noted on AVS. Son to provide transportation to home.  RNCM will sign off for now as intervention is no longer needed. Please consult [place] again if new needs arise.    Final next level of care: Home w Home Health Services Barriers to Discharge: No Barriers Identified   Patient Goals and CMS Choice Patient states their goals for this hospitalization and ongoing recovery are:: TO get stronger CMS Medicare.gov Compare Post Acute Care list provided to:: Patient Choice offered to / list presented to : Patient  Discharge Placement                       Discharge Plan and Services                          HH Arranged: PT, OT, RN [facility] Agency: [facility] Date [facility] Agency Contacted: 02/08/21 Time HH Agency Contacted: [phone] Representative spoke with at [facility] Agency: [facility]  Social Determinants of Health (SDOH) Interventions     Readmission Risk Interventions No flowsheet data found.

## 2021-02-12 DIAGNOSIS — M48061 Spinal stenosis, lumbar region without neurogenic claudication: Secondary | ICD-10-CM | POA: Diagnosis not present

## 2021-02-12 DIAGNOSIS — E1122 Type 2 diabetes mellitus with diabetic chronic kidney disease: Secondary | ICD-10-CM | POA: Diagnosis not present

## 2021-02-12 DIAGNOSIS — G4730 Sleep apnea, unspecified: Secondary | ICD-10-CM | POA: Diagnosis not present

## 2021-02-12 DIAGNOSIS — Z4789 Encounter for other orthopedic aftercare: Secondary | ICD-10-CM | POA: Diagnosis not present

## 2021-02-12 DIAGNOSIS — N182 Chronic kidney disease, stage 2 (mild): Secondary | ICD-10-CM | POA: Diagnosis not present

## 2021-02-12 DIAGNOSIS — I2510 Atherosclerotic heart disease of native coronary artery without angina pectoris: Secondary | ICD-10-CM | POA: Diagnosis not present

## 2021-02-12 DIAGNOSIS — M519 Unspecified thoracic, thoracolumbar and lumbosacral intervertebral disc disorder: Secondary | ICD-10-CM | POA: Diagnosis not present

## 2021-02-12 DIAGNOSIS — E785 Hyperlipidemia, unspecified: Secondary | ICD-10-CM | POA: Diagnosis not present

## 2021-02-12 DIAGNOSIS — I129 Hypertensive chronic kidney disease with stage 1 through stage 4 chronic kidney disease, or unspecified chronic kidney disease: Secondary | ICD-10-CM | POA: Diagnosis not present

## 2021-02-16 DIAGNOSIS — E785 Hyperlipidemia, unspecified: Secondary | ICD-10-CM | POA: Diagnosis not present

## 2021-02-16 DIAGNOSIS — Z4789 Encounter for other orthopedic aftercare: Secondary | ICD-10-CM | POA: Diagnosis not present

## 2021-02-16 DIAGNOSIS — M48061 Spinal stenosis, lumbar region without neurogenic claudication: Secondary | ICD-10-CM | POA: Diagnosis not present

## 2021-02-16 DIAGNOSIS — N182 Chronic kidney disease, stage 2 (mild): Secondary | ICD-10-CM | POA: Diagnosis not present

## 2021-02-16 DIAGNOSIS — I129 Hypertensive chronic kidney disease with stage 1 through stage 4 chronic kidney disease, or unspecified chronic kidney disease: Secondary | ICD-10-CM | POA: Diagnosis not present

## 2021-02-16 DIAGNOSIS — G4730 Sleep apnea, unspecified: Secondary | ICD-10-CM | POA: Diagnosis not present

## 2021-02-16 DIAGNOSIS — E1122 Type 2 diabetes mellitus with diabetic chronic kidney disease: Secondary | ICD-10-CM | POA: Diagnosis not present

## 2021-02-16 DIAGNOSIS — I2510 Atherosclerotic heart disease of native coronary artery without angina pectoris: Secondary | ICD-10-CM | POA: Diagnosis not present

## 2021-02-16 DIAGNOSIS — M519 Unspecified thoracic, thoracolumbar and lumbosacral intervertebral disc disorder: Secondary | ICD-10-CM | POA: Diagnosis not present

## 2021-02-18 DIAGNOSIS — I10 Essential (primary) hypertension: Secondary | ICD-10-CM | POA: Diagnosis not present

## 2021-02-18 DIAGNOSIS — G4733 Obstructive sleep apnea (adult) (pediatric): Secondary | ICD-10-CM | POA: Diagnosis not present

## 2021-02-18 DIAGNOSIS — G4710 Hypersomnia, unspecified: Secondary | ICD-10-CM | POA: Diagnosis not present

## 2021-02-19 DIAGNOSIS — G4730 Sleep apnea, unspecified: Secondary | ICD-10-CM | POA: Diagnosis not present

## 2021-02-19 DIAGNOSIS — N182 Chronic kidney disease, stage 2 (mild): Secondary | ICD-10-CM | POA: Diagnosis not present

## 2021-02-19 DIAGNOSIS — Z4789 Encounter for other orthopedic aftercare: Secondary | ICD-10-CM | POA: Diagnosis not present

## 2021-02-19 DIAGNOSIS — I2510 Atherosclerotic heart disease of native coronary artery without angina pectoris: Secondary | ICD-10-CM | POA: Diagnosis not present

## 2021-02-19 DIAGNOSIS — E1122 Type 2 diabetes mellitus with diabetic chronic kidney disease: Secondary | ICD-10-CM | POA: Diagnosis not present

## 2021-02-19 DIAGNOSIS — M519 Unspecified thoracic, thoracolumbar and lumbosacral intervertebral disc disorder: Secondary | ICD-10-CM | POA: Diagnosis not present

## 2021-02-19 DIAGNOSIS — I129 Hypertensive chronic kidney disease with stage 1 through stage 4 chronic kidney disease, or unspecified chronic kidney disease: Secondary | ICD-10-CM | POA: Diagnosis not present

## 2021-02-19 DIAGNOSIS — E785 Hyperlipidemia, unspecified: Secondary | ICD-10-CM | POA: Diagnosis not present

## 2021-02-19 DIAGNOSIS — M48061 Spinal stenosis, lumbar region without neurogenic claudication: Secondary | ICD-10-CM | POA: Diagnosis not present

## 2021-02-21 DIAGNOSIS — M48061 Spinal stenosis, lumbar region without neurogenic claudication: Secondary | ICD-10-CM | POA: Diagnosis not present

## 2021-02-21 DIAGNOSIS — M519 Unspecified thoracic, thoracolumbar and lumbosacral intervertebral disc disorder: Secondary | ICD-10-CM | POA: Diagnosis not present

## 2021-02-21 DIAGNOSIS — G4730 Sleep apnea, unspecified: Secondary | ICD-10-CM | POA: Diagnosis not present

## 2021-02-21 DIAGNOSIS — I2510 Atherosclerotic heart disease of native coronary artery without angina pectoris: Secondary | ICD-10-CM | POA: Diagnosis not present

## 2021-02-21 DIAGNOSIS — E1122 Type 2 diabetes mellitus with diabetic chronic kidney disease: Secondary | ICD-10-CM | POA: Diagnosis not present

## 2021-02-21 DIAGNOSIS — E785 Hyperlipidemia, unspecified: Secondary | ICD-10-CM | POA: Diagnosis not present

## 2021-02-21 DIAGNOSIS — I129 Hypertensive chronic kidney disease with stage 1 through stage 4 chronic kidney disease, or unspecified chronic kidney disease: Secondary | ICD-10-CM | POA: Diagnosis not present

## 2021-02-21 DIAGNOSIS — Z4789 Encounter for other orthopedic aftercare: Secondary | ICD-10-CM | POA: Diagnosis not present

## 2021-02-21 DIAGNOSIS — N182 Chronic kidney disease, stage 2 (mild): Secondary | ICD-10-CM | POA: Diagnosis not present

## 2021-02-22 DIAGNOSIS — I2510 Atherosclerotic heart disease of native coronary artery without angina pectoris: Secondary | ICD-10-CM | POA: Diagnosis not present

## 2021-02-22 DIAGNOSIS — E1122 Type 2 diabetes mellitus with diabetic chronic kidney disease: Secondary | ICD-10-CM | POA: Diagnosis not present

## 2021-02-22 DIAGNOSIS — I129 Hypertensive chronic kidney disease with stage 1 through stage 4 chronic kidney disease, or unspecified chronic kidney disease: Secondary | ICD-10-CM | POA: Diagnosis not present

## 2021-02-22 DIAGNOSIS — G4733 Obstructive sleep apnea (adult) (pediatric): Secondary | ICD-10-CM | POA: Diagnosis not present

## 2021-02-22 DIAGNOSIS — E785 Hyperlipidemia, unspecified: Secondary | ICD-10-CM | POA: Diagnosis not present

## 2021-02-22 DIAGNOSIS — I10 Essential (primary) hypertension: Secondary | ICD-10-CM | POA: Diagnosis not present

## 2021-02-22 DIAGNOSIS — G4730 Sleep apnea, unspecified: Secondary | ICD-10-CM | POA: Diagnosis not present

## 2021-02-22 DIAGNOSIS — M48061 Spinal stenosis, lumbar region without neurogenic claudication: Secondary | ICD-10-CM | POA: Diagnosis not present

## 2021-02-22 DIAGNOSIS — G4710 Hypersomnia, unspecified: Secondary | ICD-10-CM | POA: Diagnosis not present

## 2021-02-22 DIAGNOSIS — N182 Chronic kidney disease, stage 2 (mild): Secondary | ICD-10-CM | POA: Diagnosis not present

## 2021-02-22 DIAGNOSIS — Z4789 Encounter for other orthopedic aftercare: Secondary | ICD-10-CM | POA: Diagnosis not present

## 2021-02-22 DIAGNOSIS — M519 Unspecified thoracic, thoracolumbar and lumbosacral intervertebral disc disorder: Secondary | ICD-10-CM | POA: Diagnosis not present

## 2021-02-23 ENCOUNTER — Ambulatory Visit: Payer: Self-pay

## 2021-02-23 ENCOUNTER — Encounter: Payer: Self-pay | Admitting: Surgery

## 2021-02-23 ENCOUNTER — Other Ambulatory Visit: Payer: Self-pay

## 2021-02-23 ENCOUNTER — Ambulatory Visit (INDEPENDENT_AMBULATORY_CARE_PROVIDER_SITE_OTHER): Payer: Medicare HMO | Admitting: Surgery

## 2021-02-23 VITALS — Ht 67.0 in | Wt 232.2 lb

## 2021-02-23 DIAGNOSIS — M4326 Fusion of spine, lumbar region: Secondary | ICD-10-CM

## 2021-02-23 DIAGNOSIS — M4316 Spondylolisthesis, lumbar region: Secondary | ICD-10-CM

## 2021-02-23 NOTE — Progress Notes (Signed)
Post-Op Visit Note   Patient: [name] [name]           Date of Birth: June 29, 1950           MRN: [id number] Visit Date: 02/23/2021 PCP: Juliette Alcide, MD   Assessment & Plan:  Chief Complaint:  Chief Complaint  Patient presents with   Lower Back - Routine Post Op  70 year old black female who is about 3 weeks status post L3-4 extension of fusion returns.  States that she is doing very well.  Preop leg pain greatly improved.  States that her knee is also better after intra-articular Marcaine/Depo-Medrol injection that was done in the OR under anesthesia. Visit Diagnoses:  1. Spondylolisthesis of lumbar region   2. Fusion of lumbar spine     Plan: Patient follow Dr. Otelia Sergeant in 4 weeks for recheck.  Continue her lumbar brace.  She had good improvement with right knee intra-articular Marcaine/Depo-Medrol injection in the OR.  Advised her that at some point it may come down her needing definitive treatment with total knee replacement.  Today also discussed further conservative management with viscosupplementation.  We will see how she does.  Follow-Up Instructions: Return in about 4 weeks (around 03/23/2021) for With Dr. Otelia Sergeant for 6-week postop appointment.   Orders:  Orders Placed This Encounter  Procedures   XR Lumbar Spine 2-3 Views   No orders of the defined types were placed in this encounter.   Imaging: No results found.  PMFS History: Patient Active Problem List   Diagnosis Date Noted   Spinal stenosis of lumbar region with neurogenic claudication    Spondylolisthesis, lumbar region    Other spondylosis with radiculopathy, lumbar region    Acute postoperative anemia due to expected blood loss 02/01/2021    Class: Acute   Fusion of spine of lumbar region 01/31/2021   S/P lumbar fusion L4 to Sacrum 06/22/09 10/11/2020   Myalgia and myositis 06/12/2018   Other and unspecified nonspecific immunological findings 04/25/2018   Osteoarthrosis, hand 10/19/2017   Insomnia  09/23/2015   Plantar fascial fibromatosis 09/23/2015   Shortness of breath 04/28/2015   Abnormal myocardial perfusion study    Disturbance of skin sensation 08/05/2014   Chronic kidney disease, stage III (moderate) (HCC) 02/02/2014   Impaired fasting glucose 02/02/2014   Vitamin D deficiency 10/30/2013   Other osteoporosis 07/29/2012   Myocardial infarction (HCC) 12/29/2010   Cardiovascular disease 05/26/2009   KNEE PAIN, LEFT 07/02/2008   HIP PAIN 04/27/2008   OBESITY 02/24/2008   HYPERGLYCEMIA, FASTING 02/10/2008   GERD 03/12/2007   ANXIETY DEPRESSION 08/28/2006   DEGENERATIVE DISC DISEASE 08/28/2006   HYPERLIPIDEMIA 05/29/2006   CARPAL TUNNEL SYNDROME 05/29/2006   HYPERTENSION 05/29/2006   Unspecified constipation 05/29/2006   IBS 05/29/2006   ARTHRITIS 05/29/2006   LOW BACK PAIN 05/29/2006   Past Medical History:  Diagnosis Date   Arthritis    Carpal tunnel syndrome    Chronic kidney disease (CKD), stage II (mild)    Constipation    Coronary artery disease    NSTEMI 2004, DES to ostial RCA   DDD (degenerative disc disease)    Essential hypertension    GERD (gastroesophageal reflux disease)    Hyperlipidemia    IBS (irritable bowel syndrome)    Low back pain    Myocardial infarction (HCC) 2004   Postmenopausal    Pulmonary nodule    Sleep apnea    "waiting on machine to come".   Type 2 diabetes mellitus (HCC)  Family History  Problem Relation Age of Onset   Stroke Mother    Diabetes Mellitus II Mother    Kidney disease Father    Hypertension Brother    Diabetes Mellitus II Brother    Diabetes Mellitus II Sister     Past Surgical History:  Procedure Laterality Date   ABDOMINAL HYSTERECTOMY     CARDIAC CATHETERIZATION N/A 04/28/2015   Procedure: Left Heart Cath and Coronary Angiography;  Surgeon: Tonny Bollman, MD;  Location: [facility] INVASIVE CV LAB;  Service: Cardiovascular;  Laterality: N/A;   CATARACT EXTRACTION W/PHACO Right 07/19/2020   Procedure:  CATARACT EXTRACTION PHACO AND INTRAOCULAR LENS PLACEMENT RIGHT EYE;  Surgeon: Fabio Pierce, MD;  Location: [facility] ORS;  Service: Ophthalmology;  Laterality: Right;  right CDE=12.56   CATARACT EXTRACTION W/PHACO Left 08/09/2020   Procedure: CATARACT EXTRACTION PHACO AND INTRAOCULAR LENS PLACEMENT (IOC);  Surgeon: Fabio Pierce, MD;  Location: [facility] ORS;  Service: Ophthalmology;  Laterality: Left;  CDE: 6.66   Cervical disectomy and fusion     Cholestectomy     COLONOSCOPY N/A 05/07/2019   Procedure: COLONOSCOPY;  Surgeon: Corbin Ade, MD;  Location: [facility] ENDO SUITE;  Service: Endoscopy;  Laterality: N/A;  12:00   Excision of ovarian cyst     L4-5 with pedicle screws and rods     Right shoulder RTC     Social History   Occupational History   Occupation: Psychiatric nurse, Government social research officer  Tobacco Use   Smoking status: Never   Smokeless tobacco: Never  Vaping Use   Vaping Use: Never used  Substance and Sexual Activity   Alcohol use: No    Alcohol/week: 0.0 standard drinks   Drug use: No   Sexual activity: Never   Exam Very pleasant female alert and oriented in no acute distress.  Today staples removed and Steri-Strips applied.  Incision healing well without signs of infection.  Neurologically intact.

## 2021-02-24 DIAGNOSIS — M48061 Spinal stenosis, lumbar region without neurogenic claudication: Secondary | ICD-10-CM | POA: Diagnosis not present

## 2021-02-24 DIAGNOSIS — Z4789 Encounter for other orthopedic aftercare: Secondary | ICD-10-CM | POA: Diagnosis not present

## 2021-02-24 DIAGNOSIS — I2510 Atherosclerotic heart disease of native coronary artery without angina pectoris: Secondary | ICD-10-CM | POA: Diagnosis not present

## 2021-02-24 DIAGNOSIS — E785 Hyperlipidemia, unspecified: Secondary | ICD-10-CM | POA: Diagnosis not present

## 2021-02-24 DIAGNOSIS — G4730 Sleep apnea, unspecified: Secondary | ICD-10-CM | POA: Diagnosis not present

## 2021-02-24 DIAGNOSIS — N182 Chronic kidney disease, stage 2 (mild): Secondary | ICD-10-CM | POA: Diagnosis not present

## 2021-02-24 DIAGNOSIS — M519 Unspecified thoracic, thoracolumbar and lumbosacral intervertebral disc disorder: Secondary | ICD-10-CM | POA: Diagnosis not present

## 2021-02-24 DIAGNOSIS — E1122 Type 2 diabetes mellitus with diabetic chronic kidney disease: Secondary | ICD-10-CM | POA: Diagnosis not present

## 2021-02-24 DIAGNOSIS — I129 Hypertensive chronic kidney disease with stage 1 through stage 4 chronic kidney disease, or unspecified chronic kidney disease: Secondary | ICD-10-CM | POA: Diagnosis not present

## 2021-02-25 DIAGNOSIS — N182 Chronic kidney disease, stage 2 (mild): Secondary | ICD-10-CM | POA: Diagnosis not present

## 2021-02-25 DIAGNOSIS — I129 Hypertensive chronic kidney disease with stage 1 through stage 4 chronic kidney disease, or unspecified chronic kidney disease: Secondary | ICD-10-CM | POA: Diagnosis not present

## 2021-02-25 DIAGNOSIS — E785 Hyperlipidemia, unspecified: Secondary | ICD-10-CM | POA: Diagnosis not present

## 2021-02-25 DIAGNOSIS — G4730 Sleep apnea, unspecified: Secondary | ICD-10-CM | POA: Diagnosis not present

## 2021-02-25 DIAGNOSIS — Z4789 Encounter for other orthopedic aftercare: Secondary | ICD-10-CM | POA: Diagnosis not present

## 2021-02-25 DIAGNOSIS — M519 Unspecified thoracic, thoracolumbar and lumbosacral intervertebral disc disorder: Secondary | ICD-10-CM | POA: Diagnosis not present

## 2021-02-25 DIAGNOSIS — M48061 Spinal stenosis, lumbar region without neurogenic claudication: Secondary | ICD-10-CM | POA: Diagnosis not present

## 2021-02-25 DIAGNOSIS — E1122 Type 2 diabetes mellitus with diabetic chronic kidney disease: Secondary | ICD-10-CM | POA: Diagnosis not present

## 2021-02-25 DIAGNOSIS — I2510 Atherosclerotic heart disease of native coronary artery without angina pectoris: Secondary | ICD-10-CM | POA: Diagnosis not present

## 2021-03-01 DIAGNOSIS — M519 Unspecified thoracic, thoracolumbar and lumbosacral intervertebral disc disorder: Secondary | ICD-10-CM | POA: Diagnosis not present

## 2021-03-01 DIAGNOSIS — E785 Hyperlipidemia, unspecified: Secondary | ICD-10-CM | POA: Diagnosis not present

## 2021-03-01 DIAGNOSIS — I2510 Atherosclerotic heart disease of native coronary artery without angina pectoris: Secondary | ICD-10-CM | POA: Diagnosis not present

## 2021-03-01 DIAGNOSIS — Z4789 Encounter for other orthopedic aftercare: Secondary | ICD-10-CM | POA: Diagnosis not present

## 2021-03-01 DIAGNOSIS — G4730 Sleep apnea, unspecified: Secondary | ICD-10-CM | POA: Diagnosis not present

## 2021-03-01 DIAGNOSIS — I129 Hypertensive chronic kidney disease with stage 1 through stage 4 chronic kidney disease, or unspecified chronic kidney disease: Secondary | ICD-10-CM | POA: Diagnosis not present

## 2021-03-01 DIAGNOSIS — E1122 Type 2 diabetes mellitus with diabetic chronic kidney disease: Secondary | ICD-10-CM | POA: Diagnosis not present

## 2021-03-01 DIAGNOSIS — N182 Chronic kidney disease, stage 2 (mild): Secondary | ICD-10-CM | POA: Diagnosis not present

## 2021-03-01 DIAGNOSIS — M48061 Spinal stenosis, lumbar region without neurogenic claudication: Secondary | ICD-10-CM | POA: Diagnosis not present

## 2021-03-02 ENCOUNTER — Other Ambulatory Visit: Payer: Self-pay | Admitting: Specialist

## 2021-03-03 DIAGNOSIS — E1122 Type 2 diabetes mellitus with diabetic chronic kidney disease: Secondary | ICD-10-CM | POA: Diagnosis not present

## 2021-03-03 DIAGNOSIS — G4730 Sleep apnea, unspecified: Secondary | ICD-10-CM | POA: Diagnosis not present

## 2021-03-03 DIAGNOSIS — I129 Hypertensive chronic kidney disease with stage 1 through stage 4 chronic kidney disease, or unspecified chronic kidney disease: Secondary | ICD-10-CM | POA: Diagnosis not present

## 2021-03-03 DIAGNOSIS — Z4789 Encounter for other orthopedic aftercare: Secondary | ICD-10-CM | POA: Diagnosis not present

## 2021-03-03 DIAGNOSIS — M519 Unspecified thoracic, thoracolumbar and lumbosacral intervertebral disc disorder: Secondary | ICD-10-CM | POA: Diagnosis not present

## 2021-03-03 DIAGNOSIS — I2510 Atherosclerotic heart disease of native coronary artery without angina pectoris: Secondary | ICD-10-CM | POA: Diagnosis not present

## 2021-03-03 DIAGNOSIS — M48061 Spinal stenosis, lumbar region without neurogenic claudication: Secondary | ICD-10-CM | POA: Diagnosis not present

## 2021-03-03 DIAGNOSIS — E785 Hyperlipidemia, unspecified: Secondary | ICD-10-CM | POA: Diagnosis not present

## 2021-03-03 DIAGNOSIS — N182 Chronic kidney disease, stage 2 (mild): Secondary | ICD-10-CM | POA: Diagnosis not present

## 2021-03-08 DIAGNOSIS — I129 Hypertensive chronic kidney disease with stage 1 through stage 4 chronic kidney disease, or unspecified chronic kidney disease: Secondary | ICD-10-CM | POA: Diagnosis not present

## 2021-03-08 DIAGNOSIS — M519 Unspecified thoracic, thoracolumbar and lumbosacral intervertebral disc disorder: Secondary | ICD-10-CM | POA: Diagnosis not present

## 2021-03-08 DIAGNOSIS — M48061 Spinal stenosis, lumbar region without neurogenic claudication: Secondary | ICD-10-CM | POA: Diagnosis not present

## 2021-03-08 DIAGNOSIS — E1122 Type 2 diabetes mellitus with diabetic chronic kidney disease: Secondary | ICD-10-CM | POA: Diagnosis not present

## 2021-03-08 DIAGNOSIS — E785 Hyperlipidemia, unspecified: Secondary | ICD-10-CM | POA: Diagnosis not present

## 2021-03-08 DIAGNOSIS — G4730 Sleep apnea, unspecified: Secondary | ICD-10-CM | POA: Diagnosis not present

## 2021-03-08 DIAGNOSIS — I2510 Atherosclerotic heart disease of native coronary artery without angina pectoris: Secondary | ICD-10-CM | POA: Diagnosis not present

## 2021-03-08 DIAGNOSIS — N182 Chronic kidney disease, stage 2 (mild): Secondary | ICD-10-CM | POA: Diagnosis not present

## 2021-03-08 DIAGNOSIS — Z4789 Encounter for other orthopedic aftercare: Secondary | ICD-10-CM | POA: Diagnosis not present

## 2021-03-09 DIAGNOSIS — M48061 Spinal stenosis, lumbar region without neurogenic claudication: Secondary | ICD-10-CM | POA: Diagnosis not present

## 2021-03-09 DIAGNOSIS — I2510 Atherosclerotic heart disease of native coronary artery without angina pectoris: Secondary | ICD-10-CM | POA: Diagnosis not present

## 2021-03-09 DIAGNOSIS — N182 Chronic kidney disease, stage 2 (mild): Secondary | ICD-10-CM | POA: Diagnosis not present

## 2021-03-09 DIAGNOSIS — E785 Hyperlipidemia, unspecified: Secondary | ICD-10-CM | POA: Diagnosis not present

## 2021-03-09 DIAGNOSIS — E1122 Type 2 diabetes mellitus with diabetic chronic kidney disease: Secondary | ICD-10-CM | POA: Diagnosis not present

## 2021-03-09 DIAGNOSIS — I129 Hypertensive chronic kidney disease with stage 1 through stage 4 chronic kidney disease, or unspecified chronic kidney disease: Secondary | ICD-10-CM | POA: Diagnosis not present

## 2021-03-09 DIAGNOSIS — G4730 Sleep apnea, unspecified: Secondary | ICD-10-CM | POA: Diagnosis not present

## 2021-03-09 DIAGNOSIS — M519 Unspecified thoracic, thoracolumbar and lumbosacral intervertebral disc disorder: Secondary | ICD-10-CM | POA: Diagnosis not present

## 2021-03-09 DIAGNOSIS — Z4789 Encounter for other orthopedic aftercare: Secondary | ICD-10-CM | POA: Diagnosis not present

## 2021-03-10 DIAGNOSIS — E1122 Type 2 diabetes mellitus with diabetic chronic kidney disease: Secondary | ICD-10-CM | POA: Diagnosis not present

## 2021-03-10 DIAGNOSIS — E785 Hyperlipidemia, unspecified: Secondary | ICD-10-CM | POA: Diagnosis not present

## 2021-03-10 DIAGNOSIS — N182 Chronic kidney disease, stage 2 (mild): Secondary | ICD-10-CM | POA: Diagnosis not present

## 2021-03-10 DIAGNOSIS — M48061 Spinal stenosis, lumbar region without neurogenic claudication: Secondary | ICD-10-CM | POA: Diagnosis not present

## 2021-03-10 DIAGNOSIS — I129 Hypertensive chronic kidney disease with stage 1 through stage 4 chronic kidney disease, or unspecified chronic kidney disease: Secondary | ICD-10-CM | POA: Diagnosis not present

## 2021-03-10 DIAGNOSIS — G4730 Sleep apnea, unspecified: Secondary | ICD-10-CM | POA: Diagnosis not present

## 2021-03-10 DIAGNOSIS — Z4789 Encounter for other orthopedic aftercare: Secondary | ICD-10-CM | POA: Diagnosis not present

## 2021-03-10 DIAGNOSIS — I2510 Atherosclerotic heart disease of native coronary artery without angina pectoris: Secondary | ICD-10-CM | POA: Diagnosis not present

## 2021-03-10 DIAGNOSIS — M519 Unspecified thoracic, thoracolumbar and lumbosacral intervertebral disc disorder: Secondary | ICD-10-CM | POA: Diagnosis not present

## 2021-03-11 DIAGNOSIS — E785 Hyperlipidemia, unspecified: Secondary | ICD-10-CM | POA: Diagnosis not present

## 2021-03-11 DIAGNOSIS — M48061 Spinal stenosis, lumbar region without neurogenic claudication: Secondary | ICD-10-CM | POA: Diagnosis not present

## 2021-03-11 DIAGNOSIS — E1122 Type 2 diabetes mellitus with diabetic chronic kidney disease: Secondary | ICD-10-CM | POA: Diagnosis not present

## 2021-03-11 DIAGNOSIS — N182 Chronic kidney disease, stage 2 (mild): Secondary | ICD-10-CM | POA: Diagnosis not present

## 2021-03-11 DIAGNOSIS — M519 Unspecified thoracic, thoracolumbar and lumbosacral intervertebral disc disorder: Secondary | ICD-10-CM | POA: Diagnosis not present

## 2021-03-11 DIAGNOSIS — I2510 Atherosclerotic heart disease of native coronary artery without angina pectoris: Secondary | ICD-10-CM | POA: Diagnosis not present

## 2021-03-11 DIAGNOSIS — G4730 Sleep apnea, unspecified: Secondary | ICD-10-CM | POA: Diagnosis not present

## 2021-03-11 DIAGNOSIS — I129 Hypertensive chronic kidney disease with stage 1 through stage 4 chronic kidney disease, or unspecified chronic kidney disease: Secondary | ICD-10-CM | POA: Diagnosis not present

## 2021-03-11 DIAGNOSIS — Z4789 Encounter for other orthopedic aftercare: Secondary | ICD-10-CM | POA: Diagnosis not present

## 2021-03-14 DIAGNOSIS — E785 Hyperlipidemia, unspecified: Secondary | ICD-10-CM | POA: Diagnosis not present

## 2021-03-14 DIAGNOSIS — N182 Chronic kidney disease, stage 2 (mild): Secondary | ICD-10-CM | POA: Diagnosis not present

## 2021-03-14 DIAGNOSIS — Z4789 Encounter for other orthopedic aftercare: Secondary | ICD-10-CM | POA: Diagnosis not present

## 2021-03-14 DIAGNOSIS — M48061 Spinal stenosis, lumbar region without neurogenic claudication: Secondary | ICD-10-CM | POA: Diagnosis not present

## 2021-03-14 DIAGNOSIS — G4730 Sleep apnea, unspecified: Secondary | ICD-10-CM | POA: Diagnosis not present

## 2021-03-14 DIAGNOSIS — I2510 Atherosclerotic heart disease of native coronary artery without angina pectoris: Secondary | ICD-10-CM | POA: Diagnosis not present

## 2021-03-14 DIAGNOSIS — I129 Hypertensive chronic kidney disease with stage 1 through stage 4 chronic kidney disease, or unspecified chronic kidney disease: Secondary | ICD-10-CM | POA: Diagnosis not present

## 2021-03-14 DIAGNOSIS — E1122 Type 2 diabetes mellitus with diabetic chronic kidney disease: Secondary | ICD-10-CM | POA: Diagnosis not present

## 2021-03-14 DIAGNOSIS — M519 Unspecified thoracic, thoracolumbar and lumbosacral intervertebral disc disorder: Secondary | ICD-10-CM | POA: Diagnosis not present

## 2021-03-16 DIAGNOSIS — I2510 Atherosclerotic heart disease of native coronary artery without angina pectoris: Secondary | ICD-10-CM | POA: Diagnosis not present

## 2021-03-16 DIAGNOSIS — E1122 Type 2 diabetes mellitus with diabetic chronic kidney disease: Secondary | ICD-10-CM | POA: Diagnosis not present

## 2021-03-16 DIAGNOSIS — E785 Hyperlipidemia, unspecified: Secondary | ICD-10-CM | POA: Diagnosis not present

## 2021-03-16 DIAGNOSIS — M48061 Spinal stenosis, lumbar region without neurogenic claudication: Secondary | ICD-10-CM | POA: Diagnosis not present

## 2021-03-16 DIAGNOSIS — N182 Chronic kidney disease, stage 2 (mild): Secondary | ICD-10-CM | POA: Diagnosis not present

## 2021-03-16 DIAGNOSIS — I129 Hypertensive chronic kidney disease with stage 1 through stage 4 chronic kidney disease, or unspecified chronic kidney disease: Secondary | ICD-10-CM | POA: Diagnosis not present

## 2021-03-16 DIAGNOSIS — M519 Unspecified thoracic, thoracolumbar and lumbosacral intervertebral disc disorder: Secondary | ICD-10-CM | POA: Diagnosis not present

## 2021-03-16 DIAGNOSIS — G4730 Sleep apnea, unspecified: Secondary | ICD-10-CM | POA: Diagnosis not present

## 2021-03-16 DIAGNOSIS — Z4789 Encounter for other orthopedic aftercare: Secondary | ICD-10-CM | POA: Diagnosis not present

## 2021-03-21 DIAGNOSIS — G4733 Obstructive sleep apnea (adult) (pediatric): Secondary | ICD-10-CM | POA: Diagnosis not present

## 2021-03-21 DIAGNOSIS — I10 Essential (primary) hypertension: Secondary | ICD-10-CM | POA: Diagnosis not present

## 2021-03-21 DIAGNOSIS — G4710 Hypersomnia, unspecified: Secondary | ICD-10-CM | POA: Diagnosis not present

## 2021-03-23 DIAGNOSIS — N183 Chronic kidney disease, stage 3 unspecified: Secondary | ICD-10-CM | POA: Diagnosis not present

## 2021-03-23 DIAGNOSIS — R7301 Impaired fasting glucose: Secondary | ICD-10-CM | POA: Diagnosis not present

## 2021-03-23 DIAGNOSIS — E782 Mixed hyperlipidemia: Secondary | ICD-10-CM | POA: Diagnosis not present

## 2021-03-23 DIAGNOSIS — E7849 Other hyperlipidemia: Secondary | ICD-10-CM | POA: Diagnosis not present

## 2021-03-24 ENCOUNTER — Other Ambulatory Visit: Payer: Self-pay

## 2021-03-24 ENCOUNTER — Ambulatory Visit: Payer: Self-pay

## 2021-03-24 ENCOUNTER — Ambulatory Visit (INDEPENDENT_AMBULATORY_CARE_PROVIDER_SITE_OTHER): Payer: Medicare HMO | Admitting: Specialist

## 2021-03-24 ENCOUNTER — Encounter: Payer: Self-pay | Admitting: Specialist

## 2021-03-24 VITALS — BP 146/78 | HR 70 | Ht 67.0 in | Wt 232.2 lb

## 2021-03-24 DIAGNOSIS — M4326 Fusion of spine, lumbar region: Secondary | ICD-10-CM | POA: Diagnosis not present

## 2021-03-24 MED ORDER — HYDROCODONE-ACETAMINOPHEN 7.5-325 MG PO TABS: 1.0000 | ORAL_TABLET | ORAL | 0 refills | Status: DC | PRN

## 2021-03-24 NOTE — Progress Notes (Signed)
Post-Op Visit Note   Patient: [name] [name]           Date of Birth: [birth year]           MRN: [id number] Visit Date: 03/24/2021 PCP: Juliette Alcide, MD   Assessment & Plan:7.5 weeks post op right L3-4 TLIF  Chief Complaint:  Chief Complaint  Patient presents with   Lower Back - Routine Post Op    7 weeks, 3days post RIGHT L75-76 TLIF  70 year old female now 7.5 weeks post right L3-4 TLIF for spondylolisthesis. She is moving bowel and bladder is doing well. She is taking iron pills. Had blood work done at [facility] in [place] due to anemia and the results are not available.  She complains of left groin pian and some pain into the left posterior buttock and lower lumbosacral junction..  Visit Diagnoses:  1. Fusion of lumbar spine   Lumbar spine Incision is healed. Legs NV normal. SLR normal  Radiographs done and show good P and A L3-4 TLIF.   Plan: Avoid frequent bending and stooping  No lifting greater than 10 lbs. May use ice or moist heat for pain. Weight loss is of benefit. Hydrocodone for pain and robaxin for muscle relaxation.  Exercise is important to improve your indurance and does allow people to function better inspite of back pain.    Follow-Up Instructions: No follow-ups on file.   Orders:  Orders Placed This Encounter  Procedures   XR Lumbar Spine 2-3 Views   No orders of the defined types were placed in this encounter.   Imaging: No results found.  PMFS History: Patient Active Problem List   Diagnosis Date Noted   Acute postoperative anemia due to expected blood loss 02/01/2021    Priority: 2.    Class: Acute   Spinal stenosis of lumbar region with neurogenic claudication    Spondylolisthesis, lumbar region    Other spondylosis with radiculopathy, lumbar region    Fusion of spine of lumbar region 01/31/2021   S/P lumbar fusion L4 to Sacrum 06/22/09 10/11/2020   Myalgia and myositis 06/12/2018   Other and unspecified nonspecific immunological  findings 04/25/2018   Osteoarthrosis, hand 10/19/2017   Insomnia 09/23/2015   Plantar fascial fibromatosis 09/23/2015   Shortness of breath 04/28/2015   Abnormal myocardial perfusion study    Disturbance of skin sensation 08/05/2014   Chronic kidney disease, stage III (moderate) (HCC) 02/02/2014   Impaired fasting glucose 02/02/2014   Vitamin D deficiency 10/30/2013   Other osteoporosis 07/29/2012   Myocardial infarction (HCC) 12/29/2010   Cardiovascular disease 05/26/2009   KNEE PAIN, LEFT 07/02/2008   HIP PAIN 04/27/2008   OBESITY 02/24/2008   HYPERGLYCEMIA, FASTING 02/10/2008   GERD 03/12/2007   ANXIETY DEPRESSION 08/28/2006   DEGENERATIVE DISC DISEASE 08/28/2006   HYPERLIPIDEMIA 05/29/2006   CARPAL TUNNEL SYNDROME 05/29/2006   HYPERTENSION 05/29/2006   Unspecified constipation 05/29/2006   IBS 05/29/2006   ARTHRITIS 05/29/2006   LOW BACK PAIN 05/29/2006   Past Medical History:  Diagnosis Date   Arthritis    Carpal tunnel syndrome    Chronic kidney disease (CKD), stage II (mild)    Constipation    Coronary artery disease    NSTEMI 2004, DES to ostial RCA   DDD (degenerative disc disease)    Essential hypertension    GERD (gastroesophageal reflux disease)    Hyperlipidemia    IBS (irritable bowel syndrome)    Low back pain    Myocardial infarction (  HCC) 2004   Postmenopausal    Pulmonary nodule    Sleep apnea    "waiting on machine to come".   Type 2 diabetes mellitus (HCC)     Family History  Problem Relation Age of Onset   Stroke Mother    Diabetes Mellitus II Mother    Kidney disease Father    Hypertension Brother    Diabetes Mellitus II Brother    Diabetes Mellitus II Sister     Past Surgical History:  Procedure Laterality Date   ABDOMINAL HYSTERECTOMY     CARDIAC CATHETERIZATION N/A 04/28/2015   Procedure: Left Heart Cath and Coronary Angiography;  Surgeon: Tonny Bollman, MD;  Location: [facility] INVASIVE CV LAB;  Service: Cardiovascular;  Laterality:  N/A;   CATARACT EXTRACTION W/PHACO Right 07/19/2020   Procedure: CATARACT EXTRACTION PHACO AND INTRAOCULAR LENS PLACEMENT RIGHT EYE;  Surgeon: Fabio Pierce, MD;  Location: [facility];  Service: Ophthalmology;  Laterality: Right;  right CDE=12.56   CATARACT EXTRACTION W/PHACO Left 08/09/2020   Procedure: CATARACT EXTRACTION PHACO AND INTRAOCULAR LENS PLACEMENT (IOC);  Surgeon: Fabio Pierce, MD;  Location: [facility];  Service: Ophthalmology;  Laterality: Left;  CDE: 6.66   Cervical disectomy and fusion     Cholestectomy     COLONOSCOPY N/A 05/07/2019   Procedure: COLONOSCOPY;  Surgeon: Corbin Ade, MD;  Location: [facility];  Service: Endoscopy;  Laterality: N/A;  12:00   Excision of ovarian cyst     L4-5 with pedicle screws and rods     Right shoulder RTC     Social History   Occupational History   Occupation: Psychiatric nurse, Government social research officer  Tobacco Use   Smoking status: Never   Smokeless tobacco: Never  Vaping Use   Vaping Use: Never used  Substance and Sexual Activity   Alcohol use: No    Alcohol/week: 0.0 standard drinks   Drug use: No   Sexual activity: Never

## 2021-03-24 NOTE — Patient Instructions (Signed)
Plan: Avoid frequent bending and stooping  No lifting greater than 10 lbs. May use ice or moist heat for pain. Weight loss is of benefit. Hydrocodone for pain and robaxin for muscle relaxation.  Exercise is important to improve your indurance and does allow people to function better inspite of back pain.

## 2021-03-25 DIAGNOSIS — Z4789 Encounter for other orthopedic aftercare: Secondary | ICD-10-CM | POA: Diagnosis not present

## 2021-03-25 DIAGNOSIS — I2510 Atherosclerotic heart disease of native coronary artery without angina pectoris: Secondary | ICD-10-CM | POA: Diagnosis not present

## 2021-03-25 DIAGNOSIS — I129 Hypertensive chronic kidney disease with stage 1 through stage 4 chronic kidney disease, or unspecified chronic kidney disease: Secondary | ICD-10-CM | POA: Diagnosis not present

## 2021-03-25 DIAGNOSIS — M48061 Spinal stenosis, lumbar region without neurogenic claudication: Secondary | ICD-10-CM | POA: Diagnosis not present

## 2021-03-25 DIAGNOSIS — G4730 Sleep apnea, unspecified: Secondary | ICD-10-CM | POA: Diagnosis not present

## 2021-03-25 DIAGNOSIS — E785 Hyperlipidemia, unspecified: Secondary | ICD-10-CM | POA: Diagnosis not present

## 2021-03-25 DIAGNOSIS — I739 Peripheral vascular disease, unspecified: Secondary | ICD-10-CM | POA: Diagnosis not present

## 2021-03-25 DIAGNOSIS — N183 Chronic kidney disease, stage 3 unspecified: Secondary | ICD-10-CM | POA: Diagnosis not present

## 2021-03-25 DIAGNOSIS — E1122 Type 2 diabetes mellitus with diabetic chronic kidney disease: Secondary | ICD-10-CM | POA: Diagnosis not present

## 2021-03-25 DIAGNOSIS — M519 Unspecified thoracic, thoracolumbar and lumbosacral intervertebral disc disorder: Secondary | ICD-10-CM | POA: Diagnosis not present

## 2021-03-25 DIAGNOSIS — N182 Chronic kidney disease, stage 2 (mild): Secondary | ICD-10-CM | POA: Diagnosis not present

## 2021-03-25 DIAGNOSIS — E7849 Other hyperlipidemia: Secondary | ICD-10-CM | POA: Diagnosis not present

## 2021-03-28 DIAGNOSIS — M545 Low back pain, unspecified: Secondary | ICD-10-CM | POA: Diagnosis not present

## 2021-03-28 DIAGNOSIS — D510 Vitamin B12 deficiency anemia due to intrinsic factor deficiency: Secondary | ICD-10-CM | POA: Diagnosis not present

## 2021-03-28 DIAGNOSIS — E7849 Other hyperlipidemia: Secondary | ICD-10-CM | POA: Diagnosis not present

## 2021-03-28 DIAGNOSIS — I2510 Atherosclerotic heart disease of native coronary artery without angina pectoris: Secondary | ICD-10-CM | POA: Diagnosis not present

## 2021-03-28 DIAGNOSIS — M1711 Unilateral primary osteoarthritis, right knee: Secondary | ICD-10-CM | POA: Diagnosis not present

## 2021-03-28 DIAGNOSIS — I10 Essential (primary) hypertension: Secondary | ICD-10-CM | POA: Diagnosis not present

## 2021-03-28 DIAGNOSIS — D649 Anemia, unspecified: Secondary | ICD-10-CM | POA: Diagnosis not present

## 2021-03-28 DIAGNOSIS — Z1389 Encounter for screening for other disorder: Secondary | ICD-10-CM | POA: Diagnosis not present

## 2021-03-28 DIAGNOSIS — N183 Chronic kidney disease, stage 3 unspecified: Secondary | ICD-10-CM | POA: Diagnosis not present

## 2021-03-28 DIAGNOSIS — E782 Mixed hyperlipidemia: Secondary | ICD-10-CM | POA: Diagnosis not present

## 2021-03-28 DIAGNOSIS — Z1331 Encounter for screening for depression: Secondary | ICD-10-CM | POA: Diagnosis not present

## 2021-03-28 DIAGNOSIS — R7301 Impaired fasting glucose: Secondary | ICD-10-CM | POA: Diagnosis not present

## 2021-03-28 DIAGNOSIS — G4733 Obstructive sleep apnea (adult) (pediatric): Secondary | ICD-10-CM | POA: Diagnosis not present

## 2021-03-28 DIAGNOSIS — Z23 Encounter for immunization: Secondary | ICD-10-CM | POA: Diagnosis not present

## 2021-03-28 DIAGNOSIS — Z981 Arthrodesis status: Secondary | ICD-10-CM | POA: Diagnosis not present

## 2021-03-30 ENCOUNTER — Other Ambulatory Visit: Payer: Self-pay | Admitting: Specialist

## 2021-04-20 DIAGNOSIS — G4710 Hypersomnia, unspecified: Secondary | ICD-10-CM | POA: Diagnosis not present

## 2021-04-20 DIAGNOSIS — G4733 Obstructive sleep apnea (adult) (pediatric): Secondary | ICD-10-CM | POA: Diagnosis not present

## 2021-04-20 DIAGNOSIS — I10 Essential (primary) hypertension: Secondary | ICD-10-CM | POA: Diagnosis not present

## 2021-04-29 ENCOUNTER — Ambulatory Visit: Payer: Medicare HMO | Admitting: Gastroenterology

## 2021-04-29 ENCOUNTER — Encounter: Payer: Self-pay | Admitting: Gastroenterology

## 2021-04-29 ENCOUNTER — Other Ambulatory Visit: Payer: Self-pay

## 2021-04-29 ENCOUNTER — Encounter: Payer: Self-pay | Admitting: *Deleted

## 2021-04-29 ENCOUNTER — Telehealth: Payer: Self-pay | Admitting: *Deleted

## 2021-04-29 VITALS — BP 142/74 | HR 78 | Temp 97.1°F | Ht 67.0 in | Wt 226.4 lb

## 2021-04-29 DIAGNOSIS — G8929 Other chronic pain: Secondary | ICD-10-CM | POA: Diagnosis not present

## 2021-04-29 DIAGNOSIS — R1033 Periumbilical pain: Secondary | ICD-10-CM | POA: Insufficient documentation

## 2021-04-29 DIAGNOSIS — R1031 Right lower quadrant pain: Secondary | ICD-10-CM

## 2021-04-29 NOTE — Patient Instructions (Signed)
Please have labs done at least two days before your CT scan. CT scan to evaluate your abdominal pain.

## 2021-04-29 NOTE — Telephone Encounter (Signed)
PA submitted via [employer] with clinicals uploaded. Tracking# [id number]

## 2021-04-29 NOTE — Progress Notes (Signed)
Primary Care Physician: Juliette Alcide, MD  Primary Gastroenterologist:  Roetta Sessions, MD   Chief Complaint  Patient presents with   Abdominal Pain    rlq    HPI: [name] [name] is a 70 y.o. female here for further evaluation of RLQ pain. Patient last seen at time of colonoscopy in 2020.  She had 1 polyp removed from the rectum, hyperplastic.  No future screening/surveillance colonoscopies planned.  Patient presents today with complaints of one year of progressive RLQ pain. Starts at navel and radiates all the way round to the side and down into the groin. Swelling RLQ area when this occurs. Worse when bending over,can be severe. Not worse with meals. Appetite somewhat diminished. No n/v. H/o IBS-C. Had tried some fiber supplements. Senokot helps, takes once per week. Sometimes stool softener. Miralax previously didn't help. Having good BMs after Senokot does not help RLQ pain. Pepcid helps with heartburn. Abdominal pain unaffected by recent back surgery.  In August 2022 she had right L3-L4 transforaminal lumbar interbody fusion with rods, screws, cage, local and allograft bone graft, right L2-L3 lateral recess decompression.  She did have postoperative anemia.  Current Outpatient Medications  Medication Sig Dispense Refill   acetaminophen (TYLENOL) 500 MG tablet Take 1,000 mg by mouth every 6 (six) hours as needed for moderate pain or headache.     amLODipine (NORVASC) 10 MG tablet Take 10 mg by mouth daily.     aspirin 81 MG tablet Take 81 mg by mouth daily.     atorvastatin (LIPITOR) 40 MG tablet Take 40 mg by mouth daily.     Cholecalciferol (VITAMIN D3) 2000 UNITS TABS Take 2,000 Units by mouth daily.     diphenhydramine-acetaminophen (TYLENOL PM) 25-500 MG TABS tablet Take 2 tablets by mouth at bedtime as needed (sleep).     docusate sodium (COLACE) 100 MG capsule Take 1 capsule (100 mg total) by mouth 2 (two) times daily. (Patient taking differently: Take 100 mg by  mouth daily as needed.) 60 capsule 0   famotidine (PEPCID) 20 MG tablet Take 20 mg by mouth daily as needed for heartburn or indigestion.     gabapentin (NEURONTIN) 300 MG capsule Take 1 capsule (300 mg total) by mouth 3 (three) times daily. 90 capsule 3   hydrochlorothiazide (HYDRODIURIL) 25 MG tablet Take 25 mg by mouth daily.     lisinopril (PRINIVIL,ZESTRIL) 40 MG tablet Take 40 mg by mouth daily.     metFORMIN (GLUCOPHAGE) 500 MG tablet Take 500 mg by mouth daily.     methocarbamol (ROBAXIN) 500 MG tablet Take 1 tablet (500 mg total) by mouth every 6 (six) hours as needed for muscle spasms. 40 tablet 1   No current facility-administered medications for this visit.    Allergies as of 04/29/2021   (No Known Allergies)   Past Medical History:  Diagnosis Date   Arthritis    Carpal tunnel syndrome    Chronic kidney disease (CKD), stage II (mild)    Constipation    Coronary artery disease    NSTEMI 2004, DES to ostial RCA   DDD (degenerative disc disease)    Essential hypertension    GERD (gastroesophageal reflux disease)    Hyperlipidemia    IBS (irritable bowel syndrome)    Low back pain    Myocardial infarction (HCC) 2004   Postmenopausal    Pulmonary nodule    Sleep apnea    "waiting on machine to come".   Type  2 diabetes mellitus [facility](HCC)    Past Surgical History:  Procedure Laterality Date   ABDOMINAL HYSTERECTOMY     partial   CARDIAC CATHETERIZATION N/A 04/28/2015   Procedure: Left Heart Cath and Coronary Angiography;  Surgeon: Tonny BollmanMichael Cooper, MD;  [facility];  Service: Cardiovascular;  Laterality: N/A;   CATARACT EXTRACTION W/PHACO Right 07/19/2020   Procedure: CATARACT EXTRACTION PHACO AND INTRAOCULAR LENS PLACEMENT RIGHT EYE;  Surgeon: Fabio PierceWrzosek, James, MD;  Location: [facility] ORS;  Service: Ophthalmology;  Laterality: Right;  right CDE=12.56   CATARACT EXTRACTION W/PHACO Left 08/09/2020   Procedure: CATARACT EXTRACTION PHACO AND INTRAOCULAR LENS  PLACEMENT (IOC);  Surgeon: Fabio PierceWrzosek, James, MD;  Location: [facility];  Service: Ophthalmology;  Laterality: Left;  CDE: 6.66   Cervical disectomy and fusion     Cholestectomy     COLONOSCOPY N/A 05/07/2019   Procedure: COLONOSCOPY;  Surgeon: Corbin Adeourk, Robert M, MD;  Location: [facility];  Service: Endoscopy;  Laterality: N/A;  12:00   Excision of ovarian cyst     L4-5 with pedicle screws and rods     Right shoulder RTC     Family History  Problem Relation Age of Onset   Stroke Mother    Diabetes Mellitus II Mother    Kidney disease Father    Hypertension Brother    Diabetes Mellitus II Brother    Diabetes Mellitus II Sister    Social History   Tobacco Use   Smoking status: Never   Smokeless tobacco: Never  Vaping Use   Vaping Use: Never used  Substance Use Topics   Alcohol use: No    Alcohol/week: 0.0 standard drinks   Drug use: No     ROS:  General: Negative for anorexia, weight loss, fever, chills, fatigue, weakness. ENT: Negative for hoarseness, difficulty swallowing , nasal congestion. CV: Negative for chest pain, angina, palpitations, dyspnea on exertion, peripheral edema.  Respiratory: Negative for dyspnea at rest, dyspnea on exertion, cough, sputum, wheezing.  GI: See history of present illness. GU:  Negative for dysuria, hematuria, urinary incontinence, urinary frequency, nocturnal urination.  Endo: Negative for unusual weight change.    Physical Examination:   BP (!) 142/74   Pulse 78   Temp (!) 97.1 F (36.2 C) (Temporal)   Ht 5\' 7"  (1.702 m)   Wt 226 lb 6.4 oz (102.7 kg)   BMI 35.46 kg/m   General: Well-nourished, well-developed in no acute distress.  Eyes: No icterus. Mouth: masked Lungs: Clear to auscultation bilaterally.  Heart: Regular rate and rhythm, no murmurs rubs or gallops.  Abdomen: Bowel sounds are normal,  nondistended, no hepatosplenomegaly or masses, no abdominal bruits , no rebound or guarding.  Possible supraumbilical hernia, fullness  in this area. She had tenderness in RLQ as well as some in upper abdomin. No rebound or guarding. No masses.  Extremities: No lower extremity edema. No clubbing or deformities. Neuro: Alert and oriented x 4   Skin: Warm and dry, no jaundice.   Psych: Alert and cooperative, normal mood and affect.  Labs:  Lab Results  Component Value Date   CREATININE 1.06 (H) 02/04/2021   BUN 15 02/04/2021   NA 139 02/04/2021   K 3.5 02/04/2021   CL 101 02/04/2021   CO2 31 02/04/2021   Lab Results  Component Value Date   ALT 21 01/26/2021   AST 27 01/26/2021   ALKPHOS 66 01/26/2021   BILITOT 1.0 01/26/2021   Lab Results  Component Value Date  WBC 8.7 02/04/2021   HGB 8.4 (L) 02/04/2021   HCT 26.1 (L) 02/04/2021   MCV 93.9 02/04/2021   PLT 174 02/04/2021   January 26, 2021 hemoglobin 13.8. Imaging Studies: No results found.   Assessment:  Pleasant 70 year old female presenting with 1 year history of progressive abdominal pain.  Pain starts in the periumbilical region and radiates down into the right lower quadrant/right groin/right flank.  Unrelated to meals or bowel movements.  Recent back surgery did not have any effect on her pain.  She states that her surgeon did not feel like her pain was related to her surgery or pinched nerve etc.  On exam there are some question of possible periumbilical hernia with fullness just above the umbilicus, not reducible.  She has tenderness throughout the upper abdomen but more in the right lower quadrant region.  Query symptoms related to hernia versus adhesions.  Less likely appendicitis.  Colonoscopy up-to-date.  Will need further work-up.  Plan: CT A/P with contrast. Update creatinine.

## 2021-04-29 NOTE — Addendum Note (Signed)
Addended by: Armstead Peaks on: 04/29/2021 11:09 AM   Modules accepted: Orders

## 2021-05-04 NOTE — Telephone Encounter (Signed)
PA still pending review.

## 2021-05-05 ENCOUNTER — Other Ambulatory Visit: Payer: Self-pay

## 2021-05-05 ENCOUNTER — Ambulatory Visit: Payer: Medicare HMO | Admitting: Specialist

## 2021-05-05 ENCOUNTER — Telehealth: Payer: Self-pay | Admitting: Internal Medicine

## 2021-05-05 ENCOUNTER — Encounter: Payer: Self-pay | Admitting: Specialist

## 2021-05-05 ENCOUNTER — Ambulatory Visit (INDEPENDENT_AMBULATORY_CARE_PROVIDER_SITE_OTHER): Payer: Medicare HMO

## 2021-05-05 VITALS — BP 144/80 | HR 72 | Ht 67.0 in | Wt 226.0 lb

## 2021-05-05 DIAGNOSIS — M1711 Unilateral primary osteoarthritis, right knee: Secondary | ICD-10-CM

## 2021-05-05 DIAGNOSIS — M4326 Fusion of spine, lumbar region: Secondary | ICD-10-CM | POA: Diagnosis not present

## 2021-05-05 NOTE — Progress Notes (Signed)
Post-Op Visit Note   Patient: [name] [name]           Date of Birth: [birth year]           MRN: [id number] Visit Date: 05/05/2021 PCP: Curlene Labrum, MD   Assessment & Plan: 3 months post extension of lumbar fusion from L4-S1 to L3 appears to be healing well. Incision is healed. No swelling  Motor is normal  Radiographs suggest healing of the interbody fusion.  Chief Complaint:  Chief Complaint  Patient presents with  . Lower Back - Follow-up, Routine Post Op    Says its doing good, but she is having tenderness at the bottom of the incision area   Visit Diagnoses:  1. Fusion of lumbar spine       Procedure Note  Patient: [name] [name]             Date of Birth: 1951/03/15           MRN: [id number]             Visit Date: 05/05/2021  Procedures: Visit Diagnoses:  1. Fusion of lumbar spine   2. Unilateral primary osteoarthritis, right knee     Large Joint Inj: R knee on 05/05/2021 2:29 PM Indications: pain Details: 25 G 1.5 in needle, anterolateral approach  Arthrogram: No  Medications: 40 mg methylPREDNISolone acetate 40 MG/ML; 5 mL bupivacaine 0.5 % Outcome: tolerated well, no immediate complications Procedure, treatment alternatives, risks and benefits explained, specific risks discussed. Consent was given by the patient. Immediately prior to procedure a time out was called to verify the correct patient, procedure, equipment, support staff and site/side marked as required. Patient was prepped and draped in the usual sterile fashion.       Follow-Up Instructions: No follow-ups on file.   Orders:  Orders Placed This Encounter  Procedures  . XR Lumbar Spine 2-3 Views   No orders of the defined types were placed in this encounter.   Imaging: No results found.  PMFS History: Patient Active Problem List   Diagnosis Date Noted  . Acute postoperative anemia due to expected blood loss 02/01/2021    Priority: Medium     Class: Acute  . Chronic RLQ  pain 04/29/2021  . Periumbilical pain [id number]  . Spinal stenosis of lumbar region with neurogenic claudication   . Spondylolisthesis, lumbar region   . Other spondylosis with radiculopathy, lumbar region   . Fusion of spine of lumbar region 01/31/2021  . S/P lumbar fusion L4 to Sacrum 06/22/09 10/11/2020  . Myalgia and myositis 06/12/2018  . Other and unspecified nonspecific immunological findings 04/25/2018  . Osteoarthrosis, hand 10/19/2017  . Insomnia 09/23/2015  . Plantar fascial fibromatosis 09/23/2015  . Shortness of breath 04/28/2015  . Abnormal myocardial perfusion study   . Disturbance of skin sensation 08/05/2014  . Chronic kidney disease, stage III (moderate) ([place]) 02/02/2014  . Impaired fasting glucose 02/02/2014  . Vitamin D deficiency 10/30/2013  . Other osteoporosis 07/29/2012  . Myocardial infarction ([place]) 12/29/2010  . Cardiovascular disease 05/26/2009  . KNEE PAIN, LEFT 07/02/2008  . HIP PAIN 04/27/2008  . OBESITY 02/24/2008  . HYPERGLYCEMIA, FASTING 02/10/2008  . GERD 03/12/2007  . ANXIETY DEPRESSION 08/28/2006  . [place] DISEASE 08/28/2006  . HYPERLIPIDEMIA 05/29/2006  . CARPAL TUNNEL SYNDROME 05/29/2006  . HYPERTENSION 05/29/2006  . Unspecified constipation 05/29/2006  . IBS 05/29/2006  . ARTHRITIS 05/29/2006  . LOW BACK PAIN 05/29/2006   Past Medical History:  Diagnosis  Date  . Arthritis   . Carpal tunnel syndrome   . Chronic kidney disease (CKD), stage II (mild)   . Constipation   . Coronary artery disease    NSTEMI 2004, DES to ostial RCA  . DDD (degenerative disc disease)   . Essential hypertension   . GERD (gastroesophageal reflux disease)   . Hyperlipidemia   . IBS (irritable bowel syndrome)   . Low back pain   . Myocardial infarction (HCC) 2004  . Postmenopausal   . Pulmonary nodule   . Sleep apnea    "waiting on machine to come".  . Type 2 diabetes mellitus (HCC)     Family History  Problem Relation Age of Onset  .  Stroke Mother   . Diabetes Mellitus II Mother   . Kidney disease Father   . Hypertension Brother   . Diabetes Mellitus II Brother   . Diabetes Mellitus II Sister     Past Surgical History:  Procedure Laterality Date  . ABDOMINAL HYSTERECTOMY     partial  . CARDIAC CATHETERIZATION N/A 04/28/2015   Procedure: Left Heart Cath and Coronary Angiography;  Surgeon: Tonny Bollman, MD;  Location: [facility] INVASIVE CV LAB;  Service: Cardiovascular;  Laterality: N/A;  . CATARACT EXTRACTION W/PHACO Right 07/19/2020   Procedure: CATARACT EXTRACTION PHACO AND INTRAOCULAR LENS PLACEMENT RIGHT EYE;  Surgeon: Fabio Pierce, MD;  Location: [facility];  Service: Ophthalmology;  Laterality: Right;  right CDE=12.56  . CATARACT EXTRACTION W/PHACO Left 08/09/2020   Procedure: CATARACT EXTRACTION PHACO AND INTRAOCULAR LENS PLACEMENT (IOC);  Surgeon: Fabio Pierce, MD;  Location: [facility];  Service: Ophthalmology;  Laterality: Left;  CDE: 6.66  . Cervical disectomy and fusion    . Cholestectomy    . COLONOSCOPY N/A 05/07/2019   Procedure: COLONOSCOPY;  Surgeon: Corbin Ade, MD;  Location: [facility];  Service: Endoscopy;  Laterality: N/A;  12:00  . Excision of ovarian cyst    . L4-5 with pedicle screws and rods    . Right shoulder RTC     Social History   Occupational History  . Occupation: Psychiatric nurse, Government social research officer  Tobacco Use  . Smoking status: Never  . Smokeless tobacco: Never  Vaping Use  . Vaping Use: Never used  Substance and Sexual Activity  . Alcohol use: No    Alcohol/week: 0.0 standard drinks  . Drug use: No  . Sexual activity: Never

## 2021-05-05 NOTE — Telephone Encounter (Signed)
Pt has questions about when she needs to have her labs done. [phone]

## 2021-05-05 NOTE — Telephone Encounter (Signed)
Form faxed back to health help for review

## 2021-05-05 NOTE — Telephone Encounter (Signed)
Denial for CT placed on Leslie's desk for review.

## 2021-05-05 NOTE — Telephone Encounter (Signed)
Informed pt to have labs done two days before her scheduled ct scan as ordered by Tana Coast. Pt verbalized understanding.

## 2021-05-05 NOTE — Patient Instructions (Signed)
Plan: Avoid frequent bending and stooping  No lifting greater than 10 lbs. May use ice or moist heat for pain. Weight loss is of benefit. Best medication for lumbar disc disease is arthritis medications like arthritis strength tylenol no more than 4-5 per day. Exercise is important to improve your indurance and does allow people to function better inspite of back pain.   Well padded shoes help. Ice the knee that is suffering from osteoarthritis, only real proven treatments are Weight loss, NSIADs like diclofenac gel and tylenol and exercise. Well padded shoes help. Ice the knee 2-3 times a day 15-20 mins at a time.-3 times a day 15-20 mins at a time. Hot showers in the AM.  Injection with steroid may be of benefit. Hemp CBD capsules, amazon.com 5,000-7,000 mg per bottle, 60 capsules per bottle, take one capsule twice a day. Cane in the left hand to use with left leg weight bearing. Follow-Up Instructions: No follow-ups on file.

## 2021-05-06 NOTE — Telephone Encounter (Signed)
PA approved. Auth# [id number] DOS 06/03/2021-07/03/2021

## 2021-05-16 DIAGNOSIS — M2550 Pain in unspecified joint: Secondary | ICD-10-CM | POA: Diagnosis not present

## 2021-05-16 DIAGNOSIS — Z955 Presence of coronary angioplasty implant and graft: Secondary | ICD-10-CM | POA: Diagnosis not present

## 2021-05-16 DIAGNOSIS — I2510 Atherosclerotic heart disease of native coronary artery without angina pectoris: Secondary | ICD-10-CM | POA: Diagnosis not present

## 2021-05-16 DIAGNOSIS — Z0001 Encounter for general adult medical examination with abnormal findings: Secondary | ICD-10-CM | POA: Diagnosis not present

## 2021-05-16 DIAGNOSIS — I739 Peripheral vascular disease, unspecified: Secondary | ICD-10-CM | POA: Diagnosis not present

## 2021-05-16 DIAGNOSIS — N182 Chronic kidney disease, stage 2 (mild): Secondary | ICD-10-CM | POA: Diagnosis not present

## 2021-05-16 DIAGNOSIS — I10 Essential (primary) hypertension: Secondary | ICD-10-CM | POA: Diagnosis not present

## 2021-05-16 DIAGNOSIS — E7849 Other hyperlipidemia: Secondary | ICD-10-CM | POA: Diagnosis not present

## 2021-05-25 DIAGNOSIS — I739 Peripheral vascular disease, unspecified: Secondary | ICD-10-CM | POA: Diagnosis not present

## 2021-05-25 DIAGNOSIS — E7849 Other hyperlipidemia: Secondary | ICD-10-CM | POA: Diagnosis not present

## 2021-05-25 DIAGNOSIS — N183 Chronic kidney disease, stage 3 unspecified: Secondary | ICD-10-CM | POA: Diagnosis not present

## 2021-05-25 DIAGNOSIS — I129 Hypertensive chronic kidney disease with stage 1 through stage 4 chronic kidney disease, or unspecified chronic kidney disease: Secondary | ICD-10-CM | POA: Diagnosis not present

## 2021-06-01 ENCOUNTER — Other Ambulatory Visit: Payer: Self-pay

## 2021-06-01 ENCOUNTER — Other Ambulatory Visit (HOSPITAL_COMMUNITY)
Admission: RE | Admit: 2021-06-01 | Discharge: 2021-06-01 | Disposition: A | Discharge status: Home / Self Care | Payer: Medicare HMO | Source: Ambulatory Visit | Attending: Gastroenterology | Admitting: Gastroenterology

## 2021-06-01 DIAGNOSIS — R1033 Periumbilical pain: Secondary | ICD-10-CM | POA: Insufficient documentation

## 2021-06-01 DIAGNOSIS — R1031 Right lower quadrant pain: Secondary | ICD-10-CM | POA: Insufficient documentation

## 2021-06-01 LAB — BASIC METABOLIC PANEL
Anion gap: 6 (ref 5–15)
BUN: 13 mg/dL (ref 8–23)
CO2: 27 mmol/L (ref 22–32)
Calcium: 9.6 mg/dL (ref 8.9–10.3)
Chloride: 108 mmol/L (ref 98–111)
Creatinine, Ser: 0.97 mg/dL (ref 0.44–1.00)
GFR, Estimated: >60 mL/min (ref >60)
Glucose, Bld: 98 mg/dL (ref 70–99)
Potassium: 3.6 mmol/L (ref 3.5–5.1)
Sodium: 141 mmol/L (ref 135–145)

## 2021-06-03 ENCOUNTER — Other Ambulatory Visit: Payer: Self-pay

## 2021-06-03 ENCOUNTER — Ambulatory Visit (HOSPITAL_COMMUNITY)
Admission: RE | Admit: 2021-06-03 | Discharge: 2021-06-03 | Disposition: A | Discharge status: Home / Self Care | Payer: Medicare HMO | Source: Ambulatory Visit | Attending: Gastroenterology | Admitting: Gastroenterology

## 2021-06-03 DIAGNOSIS — R1033 Periumbilical pain: Secondary | ICD-10-CM | POA: Diagnosis not present

## 2021-06-03 DIAGNOSIS — R109 Unspecified abdominal pain: Secondary | ICD-10-CM | POA: Diagnosis not present

## 2021-06-03 MED ORDER — IOHEXOL 300 MG/ML  SOLN
100.0000 mL | Freq: Once | INTRAMUSCULAR | Status: AC | PRN
  Administered 2021-06-03: 100 mL via INTRAVENOUS

## 2021-06-13 ENCOUNTER — Other Ambulatory Visit: Payer: Self-pay | Admitting: Gastroenterology

## 2021-06-13 ENCOUNTER — Other Ambulatory Visit: Payer: Self-pay | Admitting: Specialist

## 2021-06-13 ENCOUNTER — Telehealth: Payer: Self-pay | Admitting: Specialist

## 2021-06-13 ENCOUNTER — Telehealth: Payer: Self-pay | Admitting: Internal Medicine

## 2021-06-13 MED ORDER — METHYLPREDNISOLONE 4 MG PO TBPK: ORAL_TABLET | ORAL | 0 refills | Status: DC

## 2021-06-13 MED ORDER — LINACLOTIDE 72 MCG PO CAPS: ORAL_CAPSULE | ORAL | 1 refills | Status: DC

## 2021-06-13 NOTE — Telephone Encounter (Signed)
Pt wanting to speak with Dr. Barbaraann Faster nurse. Would not give any context to the question. The best call back number is [phone].

## 2021-06-13 NOTE — Telephone Encounter (Signed)
Pt said she had a CT done on 12/9 and was calling to see if her results were back. [phone]

## 2021-06-13 NOTE — Telephone Encounter (Signed)
Tammy, I addressed this earlier and apparently did not forward to you. I have now routed. See result note.

## 2021-06-13 NOTE — Telephone Encounter (Signed)
Patient states that her injection is gone and she would like to get and Rx for Predinsone pack

## 2021-06-13 NOTE — Telephone Encounter (Signed)
Noted  

## 2021-06-29 DIAGNOSIS — I10 Essential (primary) hypertension: Secondary | ICD-10-CM | POA: Diagnosis not present

## 2021-06-29 DIAGNOSIS — R202 Paresthesia of skin: Secondary | ICD-10-CM | POA: Diagnosis not present

## 2021-06-29 DIAGNOSIS — M545 Low back pain, unspecified: Secondary | ICD-10-CM | POA: Diagnosis not present

## 2021-06-29 DIAGNOSIS — N183 Chronic kidney disease, stage 3 unspecified: Secondary | ICD-10-CM | POA: Diagnosis not present

## 2021-06-29 DIAGNOSIS — E7849 Other hyperlipidemia: Secondary | ICD-10-CM | POA: Diagnosis not present

## 2021-06-29 DIAGNOSIS — D649 Anemia, unspecified: Secondary | ICD-10-CM | POA: Diagnosis not present

## 2021-06-29 DIAGNOSIS — E782 Mixed hyperlipidemia: Secondary | ICD-10-CM | POA: Diagnosis not present

## 2021-06-29 DIAGNOSIS — Z981 Arthrodesis status: Secondary | ICD-10-CM | POA: Diagnosis not present

## 2021-06-29 DIAGNOSIS — R7301 Impaired fasting glucose: Secondary | ICD-10-CM | POA: Diagnosis not present

## 2021-08-04 ENCOUNTER — Ambulatory Visit: Payer: Medicare HMO | Admitting: Specialist

## 2021-08-04 ENCOUNTER — Encounter: Payer: Self-pay | Admitting: Specialist

## 2021-08-04 ENCOUNTER — Other Ambulatory Visit: Payer: Self-pay

## 2021-08-04 ENCOUNTER — Ambulatory Visit (INDEPENDENT_AMBULATORY_CARE_PROVIDER_SITE_OTHER): Payer: Medicare HMO

## 2021-08-04 VITALS — BP 130/73 | Ht 67.0 in | Wt 226.0 lb

## 2021-08-04 DIAGNOSIS — M4326 Fusion of spine, lumbar region: Secondary | ICD-10-CM | POA: Diagnosis not present

## 2021-08-04 MED ORDER — PREGABALIN 50 MG PO CAPS: 50.0000 mg | ORAL_CAPSULE | Freq: Two times a day (BID) | ORAL | 0 refills | Status: DC

## 2021-08-04 MED ORDER — HYDROCODONE-ACETAMINOPHEN 5-325 MG PO TABS: 1.0000 | ORAL_TABLET | Freq: Four times a day (QID) | ORAL | 0 refills | Status: DC | PRN

## 2021-08-04 NOTE — Addendum Note (Signed)
Addended by: [name] on: 08/04/2021 05:21 PM   Modules accepted: Orders

## 2021-08-04 NOTE — Progress Notes (Signed)
Office Visit Note   Patient: [name] LudwigKathy M [name]           Date of Birth: July 21, 1950           MRN: [id number] Visit Date: 08/04/2021              Requested by: Juliette AlcideBurdine, Steven E, MD [address] [place],  [place] [id number] PCP: Juliette AlcideBurdine, Steven E, MD   Assessment & Plan: Visit Diagnoses:  1. Fusion of lumbar spine     Plan: Avoid frequent bending and stooping  No lifting greater than 10 lbs. May use ice or moist heat for pain. Weight loss is of benefit. Will give Some hydrocodone for more severe pain. Exercise is important to improve your indurance and does allow people to function better inspite of back pain.    Follow-Up Instructions: Return in about 3 months (around 11/01/2021).   Orders:  Orders Placed This Encounter  Procedures   XR Lumbar Spine 2-3 Views   No orders of the defined types were placed in this encounter.     Procedures: No procedures performed   Clinical Data: No additional findings.   Subjective: No chief complaint on file.   71 year old female with recent 01/2021 extension of fusion to L3-4 and hemilaminectomy at L2-3. She is here with mainly lumbosacral central pain or sacral pain in the midline. She is not experiencing any bowel or bladder concern. She has constipation and fibercon helps and she has seen a GI speciallist and is reassured that all is function. There is feelings of heat into the left leg and some pain into the legs.   Review of Systems  Constitutional: Negative.   HENT: Negative.    Eyes: Negative.   Respiratory: Negative.    Cardiovascular: Negative.   Gastrointestinal: Negative.   Endocrine: Negative.   Genitourinary: Negative.   Musculoskeletal: Negative.   Skin: Negative.   Allergic/Immunologic: Negative.   Neurological: Negative.   Hematological: Negative.   Psychiatric/Behavioral: Negative.      Objective: Vital Signs: There were no vitals taken for this visit.  Physical Exam Constitutional:      Appearance: She  is well-developed.  HENT:     Head: Normocephalic and atraumatic.  Eyes:     Pupils: Pupils are equal, round, and reactive to light.  Pulmonary:     Effort: Pulmonary effort is normal.     Breath sounds: Normal breath sounds.  Abdominal:     General: Bowel sounds are normal.     Palpations: Abdomen is soft.  Musculoskeletal:     Cervical back: Normal range of motion and neck supple.     Lumbar back: Negative right straight leg raise test and negative left straight leg raise test.  Skin:    General: Skin is warm and dry.  Neurological:     Mental Status: She is alert and oriented to person, place, and time.  Psychiatric:        Behavior: Behavior normal.        Thought Content: Thought content normal.        Judgment: Judgment normal.    Back Exam   Tenderness  The patient is experiencing tenderness in the lumbar.  Range of Motion  Extension:  abnormal  Flexion:  abnormal  Lateral bend right:  abnormal  Lateral bend left:  abnormal  Rotation right:  abnormal  Rotation left:  abnormal   Muscle Strength  Right Quadriceps:  5/5  Left Quadriceps:  5/5  Right Hamstrings:  5/5  Left Hamstrings:  5/5   Tests  Straight leg raise right: negative Straight leg raise left: negative    Specialty Comments:  No specialty comments available.  Imaging: No results found.   PMFS History: Patient Active Problem List   Diagnosis Date Noted   Acute postoperative anemia due to expected blood loss 02/01/2021    Priority: Medium     Class: Acute   Chronic RLQ pain XX123456   Periumbilical pain XX123456   Spinal stenosis of lumbar region with neurogenic claudication    Spondylolisthesis, lumbar region    Other spondylosis with radiculopathy, lumbar region    Fusion of spine of lumbar region 01/31/2021   S/P lumbar fusion L4 to Sacrum 06/22/09 10/11/2020   Myalgia and myositis 06/12/2018   Other and unspecified nonspecific immunological findings 04/25/2018    Osteoarthrosis, hand 10/19/2017   Insomnia 09/23/2015   Plantar fascial fibromatosis 09/23/2015   Shortness of breath 04/28/2015   Abnormal myocardial perfusion study    Disturbance of skin sensation 08/05/2014   Chronic kidney disease, stage III (moderate) (HCC) 02/02/2014   Impaired fasting glucose 02/02/2014   Vitamin D deficiency 10/30/2013   Other osteoporosis 07/29/2012   Myocardial infarction ([place]) 12/29/2010   Cardiovascular disease 05/26/2009   KNEE PAIN, LEFT 07/02/2008   HIP PAIN 04/27/2008   OBESITY 02/24/2008   HYPERGLYCEMIA, FASTING 02/10/2008   GERD 03/12/2007   ANXIETY DEPRESSION 08/28/2006   DEGENERATIVE [place] DISEASE 08/28/2006   HYPERLIPIDEMIA 05/29/2006   CARPAL TUNNEL SYNDROME 05/29/2006   HYPERTENSION 05/29/2006   Unspecified constipation 05/29/2006   IBS 05/29/2006   ARTHRITIS 05/29/2006   LOW BACK PAIN 05/29/2006   Past Medical History:  Diagnosis Date   Arthritis    Carpal tunnel syndrome    Chronic kidney disease (CKD), stage II (mild)    Constipation    Coronary artery disease    NSTEMI 2004, DES to ostial RCA   DDD (degenerative disc disease)    Essential hypertension    GERD (gastroesophageal reflux disease)    Hyperlipidemia    IBS (irritable bowel syndrome)    Low back pain    Myocardial infarction ([place]) 2004   Postmenopausal    Pulmonary nodule    Sleep apnea    "waiting on machine to come".   Type 2 diabetes mellitus ([place])     Family History  Problem Relation Age of Onset   Stroke Mother    Diabetes Mellitus II Mother    Kidney disease Father    Hypertension Brother    Diabetes Mellitus II Brother    Diabetes Mellitus II Sister     Past Surgical History:  Procedure Laterality Date   ABDOMINAL HYSTERECTOMY     partial   CARDIAC CATHETERIZATION N/A 04/28/2015   Procedure: Left Heart Cath and Coronary Angiography;  Surgeon: Sherren Mocha, MD;  Location: [place] CV LAB;  Service: Cardiovascular;  Laterality: N/A;    CATARACT EXTRACTION W/PHACO Right 07/19/2020   Procedure: CATARACT EXTRACTION PHACO AND INTRAOCULAR LENS PLACEMENT RIGHT EYE;  Surgeon: Baruch Goldmann, MD;  Location: [facility];  Service: Ophthalmology;  Laterality: Right;  right CDE=12.56   CATARACT EXTRACTION W/PHACO Left 08/09/2020   Procedure: CATARACT EXTRACTION PHACO AND INTRAOCULAR LENS PLACEMENT (IOC);  Surgeon: Baruch Goldmann, MD;  Location: [facility];  Service: Ophthalmology;  Laterality: Left;  CDE: 6.66   Cervical disectomy and fusion     Cholestectomy     COLONOSCOPY N/A 05/07/2019   Procedure: COLONOSCOPY;  Surgeon:  Rourk, Cristopher Estimable, MD;  Location: [facility];  Service: Endoscopy;  Laterality: N/A;  12:00   Excision of ovarian cyst     L4-5 with pedicle screws and rods     Right shoulder RTC     Social History   Occupational History   Occupation: Oncologist, Clinical cytogeneticist  Tobacco Use   Smoking status: Never   Smokeless tobacco: Never  Vaping Use   Vaping Use: Never used  Substance and Sexual Activity   Alcohol use: No    Alcohol/week: 0.0 standard drinks   Drug use: No   Sexual activity: Never

## 2021-08-05 ENCOUNTER — Telehealth: Payer: Self-pay | Admitting: Specialist

## 2021-08-05 NOTE — Telephone Encounter (Signed)
Pt called states she needs to speak with christy about her prescriptions. She did not want to have to repeat herself so that's all she gave me.   CB [phone]

## 2021-08-05 NOTE — Telephone Encounter (Signed)
I called patient. Was unable to get an answer. LM for her to [facility]

## 2021-08-08 NOTE — Telephone Encounter (Signed)
Tried calling patient to advise. No answer. LMVM to return call to advise.

## 2021-09-07 DIAGNOSIS — E119 Type 2 diabetes mellitus without complications: Secondary | ICD-10-CM | POA: Diagnosis not present

## 2021-09-07 DIAGNOSIS — Z0100 Encounter for examination of eyes and vision without abnormal findings: Secondary | ICD-10-CM | POA: Diagnosis not present

## 2021-09-07 DIAGNOSIS — H524 Presbyopia: Secondary | ICD-10-CM | POA: Diagnosis not present

## 2021-09-23 DIAGNOSIS — E782 Mixed hyperlipidemia: Secondary | ICD-10-CM | POA: Diagnosis not present

## 2021-09-23 DIAGNOSIS — E7849 Other hyperlipidemia: Secondary | ICD-10-CM | POA: Diagnosis not present

## 2021-09-23 DIAGNOSIS — R7301 Impaired fasting glucose: Secondary | ICD-10-CM | POA: Diagnosis not present

## 2021-09-23 DIAGNOSIS — I10 Essential (primary) hypertension: Secondary | ICD-10-CM | POA: Diagnosis not present

## 2021-09-23 DIAGNOSIS — N182 Chronic kidney disease, stage 2 (mild): Secondary | ICD-10-CM | POA: Diagnosis not present

## 2021-09-27 DIAGNOSIS — I10 Essential (primary) hypertension: Secondary | ICD-10-CM | POA: Diagnosis not present

## 2021-09-27 DIAGNOSIS — Z1231 Encounter for screening mammogram for malignant neoplasm of breast: Secondary | ICD-10-CM | POA: Diagnosis not present

## 2021-09-27 DIAGNOSIS — R202 Paresthesia of skin: Secondary | ICD-10-CM | POA: Diagnosis not present

## 2021-09-27 DIAGNOSIS — Z981 Arthrodesis status: Secondary | ICD-10-CM | POA: Diagnosis not present

## 2021-09-27 DIAGNOSIS — M545 Low back pain, unspecified: Secondary | ICD-10-CM | POA: Diagnosis not present

## 2021-09-27 DIAGNOSIS — E7849 Other hyperlipidemia: Secondary | ICD-10-CM | POA: Diagnosis not present

## 2021-09-27 DIAGNOSIS — N183 Chronic kidney disease, stage 3 unspecified: Secondary | ICD-10-CM | POA: Diagnosis not present

## 2021-09-27 DIAGNOSIS — R7301 Impaired fasting glucose: Secondary | ICD-10-CM | POA: Diagnosis not present

## 2021-09-27 DIAGNOSIS — I2510 Atherosclerotic heart disease of native coronary artery without angina pectoris: Secondary | ICD-10-CM | POA: Diagnosis not present

## 2021-10-03 ENCOUNTER — Encounter: Payer: Self-pay | Admitting: Specialist

## 2021-10-03 ENCOUNTER — Ambulatory Visit: Payer: Medicare HMO | Admitting: Specialist

## 2021-10-03 VITALS — BP 135/79 | HR 70 | Ht 67.0 in | Wt 226.0 lb

## 2021-10-03 DIAGNOSIS — M4326 Fusion of spine, lumbar region: Secondary | ICD-10-CM

## 2021-10-03 DIAGNOSIS — M25461 Effusion, right knee: Secondary | ICD-10-CM

## 2021-10-03 DIAGNOSIS — M1711 Unilateral primary osteoarthritis, right knee: Secondary | ICD-10-CM

## 2021-10-03 DIAGNOSIS — M4316 Spondylolisthesis, lumbar region: Secondary | ICD-10-CM

## 2021-10-03 MED ORDER — HYDROCODONE-ACETAMINOPHEN 10-325 MG PO TABS: 0.5000 | ORAL_TABLET | Freq: Four times a day (QID) | ORAL | 0 refills | Status: DC | PRN

## 2021-10-03 NOTE — Patient Instructions (Signed)
Avoid frequent bending and stooping  ?No lifting greater than 10 lbs. ?May use ice or moist heat for pain. ?Weight loss is of benefit. ?Best medication for lumbar disc disease is arthritis medications like arthritis strength tylenol no more than 4-5 per day. ?Exercise is important to improve your indurance and does allow people to function better inspite of back pain. ?  ?Well padded shoes help. ?Ice the knee that is suffering from osteoarthritis, only real proven treatments are ?Weight loss, NSIADs like diclofenac gel and tylenol and exercise. ?Well padded shoes help. ?Ice the knee 2-3 times a day 15-20 mins at a time.-3 times a day 15-20 mins at a time. Hot showers in the AM.  ?Injection with steroid may be of benefit. ?Hemp CBD capsules, amazon.com 5,000-7,000 mg per bottle, 60 capsules per bottle, take one capsule twice a day. ?Cane in the left hand to use with left leg weight bearing. ?Follow-Up Instructions: No follow-ups on file.   ?

## 2021-10-03 NOTE — Progress Notes (Signed)
? ?Office Visit Note ?  ?Patient: [name] [name]           ?Date of Birth: 28-May-1951           ?MRN: [id number] ?Visit Date: 10/03/2021 ?             ?Requested by: Curlene Labrum, MD ?[address] ?[place],  [place] [address] ?PCP: Curlene Labrum, MD ? ? ?Assessment & Plan: ?Visit Diagnoses:  ?1. Fusion of lumbar spine   ?2. Spondylolisthesis of lumbar region   ?3. Unilateral primary osteoarthritis, right knee   ?4. Effusion, right knee   ? ? ?Plan: Avoid frequent bending and stooping  ?No lifting greater than 10 lbs. ?May use ice or moist heat for pain. ?Weight loss is of benefit. ?Best medication for lumbar disc disease is arthritis medications like arthritis strength tylenol no more than 4-5 per day. ?Exercise is important to improve your indurance and does allow people to function better inspite of back pain. ?  ?Well padded shoes help. ?Ice the knee that is suffering from osteoarthritis, only real proven treatments are ?Weight loss, NSIADs like diclofenac gel and tylenol and exercise. ?Well padded shoes help. ?Ice the knee 2-3 times a day 15-20 mins at a time.-3 times a day 15-20 mins at a time. Hot showers in the AM.  ?Injection with steroid may be of benefit. ?Hemp CBD capsules, amazon[employer] 5,000-7,000 mg per bottle, 60 capsules per bottle, take one capsule twice a day. ?Cane in the left hand to use with left leg weight bearing. ?Follow-Up Instructions: No follow-ups on file.   ? ?Follow-Up Instructions: Return in about 3 months (around 01/02/2022) for Please make her an appointment to see Dr. Ninfa Linden for right Total Knee Replacement.  ? ?Orders:  ?No orders of the defined types were placed in this encounter. ? ?Meds ordered this encounter  ?Medications  ? HYDROcodone-acetaminophen (NORCO) 10-325 MG tablet  ?  Sig: Take 0.5 tablets by mouth every 6 (six) hours as needed.  ?  Dispense:  14 tablet  ?  Refill:  0  ? ? ? ? Procedures: ?No procedures performed ? ? ?Clinical Data: ?No additional  findings. ? ? ?Subjective: ?Chief Complaint  ?Patient presents with  ? Lower Back - Follow-up  ? ? ?71 year old female with history of spondylolisthesis with right greater than left knee arthritis.Right knee with starting pain, night pain and pain with first standing and walking. Has stage 3 CKD and can not take NSAIDs. Saw kidney specialist and upped the amount of gabapentin. She is not taking narcotics but is taking gabapentin and lyrica. Body mass index is 35.4 kg/m?.  ? ? ?Review of Systems  ?Constitutional: Negative.   ?HENT: Negative.    ?Eyes: Negative.   ?Respiratory: Negative.    ?Cardiovascular: Negative.   ?Gastrointestinal: Negative.   ?Endocrine: Negative.   ?Genitourinary: Negative.   ?Musculoskeletal: Negative.   ?Skin: Negative.   ?Allergic/Immunologic: Negative.   ?Neurological: Negative.   ?Hematological: Negative.   ?Psychiatric/Behavioral: Negative.    ? ? ?Objective: ?Vital Signs: BP 135/79 (BP Location: Left Arm, Patient Position: Sitting)   Pulse 70   Ht 5\' 7"  (1.702 m)   Wt 226 lb (102.5 kg)   BMI 35.40 kg/m?  ? ?Physical Exam ?Constitutional:   ?   Appearance: She is well-developed.  ?HENT:  ?   Head: Normocephalic and atraumatic.  ?Eyes:  ?   Pupils: Pupils are equal, round, and reactive to light.  ?Pulmonary:  ?  Effort: Pulmonary effort is normal.  ?   Breath sounds: Normal breath sounds.  ?Abdominal:  ?   General: Bowel sounds are normal.  ?   Palpations: Abdomen is soft.  ?Musculoskeletal:  ?   Cervical back: Normal range of motion and neck supple.  ?   Right knee: Effusion present.  ?   Instability Tests: Medial McMurray test positive and lateral McMurray test positive.  ?Skin: ?   General: Skin is warm and dry.  ?Neurological:  ?   Mental Status: She is alert and oriented to person, place, and time.  ?Psychiatric:     ?   Behavior: Behavior normal.     ?   Thought Content: Thought content normal.     ?   Judgment: Judgment normal.  ? ?Right Knee Exam  ? ?Tenderness  ?The patient  is experiencing tenderness in the medial retinaculum and lateral joint line. ? ?Range of Motion  ?Extension:  -10 abnormal  ?Flexion:  100 abnormal  ? ?Tests  ?McMurray:  Medial - positive Lateral - positive ?Varus: positive  ?Lachman:  Anterior - negative    Posterior - negative ?Drawer:  Anterior - negative    Posterior - negative ?Patellar apprehension: positive ? ?Other  ?Scars: absent ?Sensation: normal ?Pulse: present ?Swelling: moderate ?Effusion: effusion present ? ?Comments:  Effusion right knee. ? ? ?Back Exam  ? ?Tenderness  ?The patient is experiencing tenderness in the lumbar. ? ?Muscle Strength  ?Right Quadriceps:  5/5  ?Left Quadriceps:  5/5  ?Right Hamstrings:  5/5  ?Left Hamstrings:  5/5  ? ?Reflexes  ?Achilles:  0/4 ? ? ? ?Specialty Comments:  ?No specialty comments available. ? ?Imaging: ?No results found. ? ? ?PMFS History: ?Patient Active Problem List  ? Diagnosis Date Noted  ? Acute postoperative anemia due to expected blood loss 02/01/2021  ?  Priority: Medium   ?  Class: Acute  ? Chronic RLQ pain 04/29/2021  ? Periumbilical pain [id number]  ? Spinal stenosis of lumbar region with neurogenic claudication   ? Spondylolisthesis, lumbar region   ? Other spondylosis with radiculopathy, lumbar region   ? Fusion of spine of lumbar region 01/31/2021  ? S/P lumbar fusion L4 to Sacrum 06/22/09 10/11/2020  ? Myalgia and myositis 06/12/2018  ? Other and unspecified nonspecific immunological findings 04/25/2018  ? Osteoarthrosis, hand 10/19/2017  ? Insomnia 09/23/2015  ? Plantar fascial fibromatosis 09/23/2015  ? Shortness of breath 04/28/2015  ? Abnormal myocardial perfusion study   ? Disturbance of skin sensation 08/05/2014  ? Chronic kidney disease, stage III (moderate) ([place]) 02/02/2014  ? Impaired fasting glucose 02/02/2014  ? Vitamin D deficiency 10/30/2013  ? Other osteoporosis 07/29/2012  ? Myocardial infarction ([place]) 12/29/2010  ? Cardiovascular disease 05/26/2009  ? KNEE PAIN, LEFT 07/02/2008  ?  HIP PAIN 04/27/2008  ? OBESITY 02/24/2008  ? HYPERGLYCEMIA, FASTING 02/10/2008  ? GERD 03/12/2007  ? ANXIETY DEPRESSION 08/28/2006  ? [place] DISEASE 08/28/2006  ? HYPERLIPIDEMIA 05/29/2006  ? CARPAL TUNNEL SYNDROME 05/29/2006  ? HYPERTENSION 05/29/2006  ? Unspecified constipation 05/29/2006  ? IBS 05/29/2006  ? ARTHRITIS 05/29/2006  ? LOW BACK PAIN 05/29/2006  ? ?Past Medical History:  ?Diagnosis Date  ? Arthritis   ? Carpal tunnel syndrome   ? Chronic kidney disease (CKD), stage II (mild)   ? Constipation   ? Coronary artery disease   ? NSTEMI 2004, DES to ostial RCA  ? DDD (degenerative disc disease)   ? Essential hypertension   ?  GERD (gastroesophageal reflux disease)   ? Hyperlipidemia   ? IBS (irritable bowel syndrome)   ? Low back pain   ? Myocardial infarction [facility]) 2004  ? Postmenopausal   ? Pulmonary nodule   ? Sleep apnea   ? "waiting on machine to come".  ? Type 2 diabetes mellitus ([place])   ?  ?Family History  ?Problem Relation Age of Onset  ? Stroke Mother   ? Diabetes Mellitus II Mother   ? Kidney disease Father   ? Hypertension Brother   ? Diabetes Mellitus II Brother   ? Diabetes Mellitus II Sister   ?  ?Past Surgical History:  ?Procedure Laterality Date  ? ABDOMINAL HYSTERECTOMY    ? partial  ? CARDIAC CATHETERIZATION N/A 04/28/2015  ? Procedure: Left Heart Cath and Coronary Angiography;  Surgeon: Sherren Mocha, MD;  Location: [facility];  Service: Cardiovascular;  Laterality: N/A;  ? CATARACT EXTRACTION W/PHACO Right 07/19/2020  ? Procedure: CATARACT EXTRACTION PHACO AND INTRAOCULAR LENS PLACEMENT RIGHT EYE;  Surgeon: Baruch Goldmann, MD;  Location: [facility];  Service: Ophthalmology;  Laterality: Right;  right ?CDE=12.56  ? CATARACT EXTRACTION W/PHACO Left 08/09/2020  ? Procedure: CATARACT EXTRACTION PHACO AND INTRAOCULAR LENS PLACEMENT (IOC);  Surgeon: Baruch Goldmann, MD;  Location: [facility];  Service: Ophthalmology;  Laterality: Left;  CDE: 6.66  ? Cervical disectomy and fusion    ?  Cholestectomy    ? COLONOSCOPY N/A 05/07/2019  ? Procedure: COLONOSCOPY;  Surgeon: Daneil Dolin, MD;  Location: [facility];  Service: Endoscopy;  Laterality: N/A;  12:00  ? Excision of ovarian cyst    ? L4-5 with pedicl

## 2021-10-12 ENCOUNTER — Ambulatory Visit (INDEPENDENT_AMBULATORY_CARE_PROVIDER_SITE_OTHER): Payer: Medicare HMO

## 2021-10-12 ENCOUNTER — Ambulatory Visit: Payer: Medicare HMO | Admitting: Orthopaedic Surgery

## 2021-10-12 VITALS — Ht 67.0 in | Wt 226.0 lb

## 2021-10-12 DIAGNOSIS — M25561 Pain in right knee: Secondary | ICD-10-CM

## 2021-10-12 DIAGNOSIS — M1711 Unilateral primary osteoarthritis, right knee: Secondary | ICD-10-CM | POA: Diagnosis not present

## 2021-10-12 DIAGNOSIS — G8929 Other chronic pain: Secondary | ICD-10-CM | POA: Diagnosis not present

## 2021-10-12 NOTE — Progress Notes (Signed)
The patient is a very pleasant 71 year old female who was referred to me by Dr. Otelia Sergeant to evaluate and treat right knee osteoarthritis and to discuss knee replacement surgery with the patient.  He has already been treating her osteoarthritis of her right knee for some time now and she has had injections of steroid which at this point only last for a few days.  She does ambulate with a cane.  She has x-rays that show severe tricompartmental arthritis of the right knee.  At this point her right knee pain is daily and it is detrimentally affecting her mobility, her actives daily living and her quality of life to the point that she does wish to proceed with knee replacement surgery.  I was able to review all of her notes in epic as well as her medications and past medical history and talked her in length in detail about knee replacement surgery.  She has been dealing with worsening pain for well over 12 months now and is failed all forms of conservative treatment.  She is a diabetic but reports good blood glucose control.  She is not on blood thinning medications. ? ?Examination of her right knee does show global tenderness at the medial and lateral joint line and patellofemoral joint.  There is a moderate joint effusion and painful arc of motion of her knee.  It is ligamentously stable. ? ?I did review the x-rays of her right knee with her showing tricompartment arthritis with significant joint space narrowing and osteophytes in all 3 compartments. ? ?I did go over knee replacement surgery with her in detail.  I showed her a knee replacement model and explained in detail what the surgery involves.  I discussed the risks and benefits of surgery in detail and talked her about what to expect from an intraoperative and postoperative course.  She is interested in having this scheduled so we will work on getting this scheduled when available.  All questions and concerns were answered and addressed. ?

## 2021-11-10 ENCOUNTER — Other Ambulatory Visit: Payer: Self-pay

## 2021-11-12 DIAGNOSIS — W1839XA Other fall on same level, initial encounter: Secondary | ICD-10-CM | POA: Diagnosis not present

## 2021-11-12 DIAGNOSIS — M25552 Pain in left hip: Secondary | ICD-10-CM | POA: Diagnosis not present

## 2021-11-12 DIAGNOSIS — R42 Dizziness and giddiness: Secondary | ICD-10-CM | POA: Diagnosis not present

## 2021-11-12 DIAGNOSIS — R531 Weakness: Secondary | ICD-10-CM | POA: Diagnosis not present

## 2021-11-30 ENCOUNTER — Other Ambulatory Visit: Payer: Self-pay | Admitting: Physician Assistant

## 2021-11-30 DIAGNOSIS — M1711 Unilateral primary osteoarthritis, right knee: Secondary | ICD-10-CM

## 2021-12-09 NOTE — Patient Instructions (Signed)
DUE TO COVID-19 ONLY TWO VISITORS  (aged 72 and older)  ARE ALLOWED TO COME WITH YOU AND STAY IN THE WAITING ROOM ONLY DURING PRE OP AND PROCEDURE.   **NO VISITORS ARE ALLOWED IN THE SHORT STAY AREA OR RECOVERY ROOM!!**  IF YOU WILL BE ADMITTED INTO THE HOSPITAL YOU ARE ALLOWED ONLY FOUR SUPPORT PEOPLE DURING VISITATION HOURS ONLY (7 AM -8PM)   The support person(s) must pass our screening, gel in and out, and wear a mask at all times, including in the patient's room. Patients must also wear a mask when staff or their support person are in the room. Visitors GUEST BADGE MUST BE WORN VISIBLY  One adult visitor may remain with you overnight and MUST be in the room by 8 P.M.     Your procedure is scheduled on: 12/16/21   Report to [facility] Main Entrance    Report to admitting at 8:20 AM   Call this number if you have problems the morning of surgery [phone]   Do not eat food :After Midnight.   After Midnight you may have the following liquids until 7:50 AM DAY OF SURGERY  Water Black Coffee (sugar ok, NO MILK/CREAM OR CREAMERS)  Tea (sugar ok, NO MILK/CREAM OR CREAMERS) regular and decaf                             Plain Jell-O (NO RED)                                           Fruit ices (not with fruit pulp, NO RED)                                     Popsicles (NO RED)                                                                  Juice: apple, WHITE grape, WHITE cranberry Sports drinks like Gatorade (NO RED) Clear broth(vegetable,chicken,beef)                The day of surgery:  Drink ONE (1) Pre-Surgery G2 at 7:50 AM the morning of surgery. Drink in one sitting. Do not sip.  This drink was given to you during your hospital  pre-op appointment visit. Nothing else to drink after completing the  Pre-Surgery G2.          If you have questions, please contact your surgeon's office.   FOLLOW BOWEL PREP AND ANY ADDITIONAL PRE OP INSTRUCTIONS YOU RECEIVED FROM  YOUR SURGEON'S OFFICE!!!     Oral Hygiene is also important to reduce your risk of infection.                                    Remember - BRUSH YOUR TEETH THE MORNING OF SURGERY WITH YOUR REGULAR TOOTHPASTE   Do NOT smoke after Midnight   Take these medicines the morning of surgery with A  SIP OF WATER: Tylenol, Amlodipine, Atorvastatin, Famotidine, Gabapentin   DO NOT TAKE ANY ORAL DIABETIC MEDICATIONS DAY OF YOUR SURGERY  How to Manage Your Diabetes Before and After Surgery  Why is it important to control my blood sugar before and after surgery? Improving blood sugar levels before and after surgery helps healing and can limit problems. A way of improving blood sugar control is eating a healthy diet by:  Eating less sugar and carbohydrates  Increasing activity/exercise  Talking with your doctor about reaching your blood sugar goals High blood sugars (greater than 180 mg/dL) can raise your risk of infections and slow your recovery, so you will need to focus on controlling your diabetes during the weeks before surgery. Make sure that the doctor who takes care of your diabetes knows about your planned surgery including the date and location.  How do I manage my blood sugar before surgery? Check your blood sugar at least 4 times a day, starting 2 days before surgery, to make sure that the level is not too high or low. Check your blood sugar the morning of your surgery when you wake up and every 2 hours until you get to the Short Stay unit. If your blood sugar is less than 70 mg/dL, you will need to treat for low blood sugar: Do not take insulin. Treat a low blood sugar (less than 70 mg/dL) with  cup of clear juice (cranberry or apple), 4 glucose tablets, OR glucose gel. Recheck blood sugar in 15 minutes after treatment (to make sure it is greater than 70 mg/dL). If your blood sugar is not greater than 70 mg/dL on recheck, call [phone] for further instructions. Report your blood  sugar to the short stay nurse when you get to Short Stay.  If you are admitted to the hospital after surgery: Your blood sugar will be checked by the staff and you will probably be given insulin after surgery (instead of oral diabetes medicines) to make sure you have good blood sugar levels. The goal for blood sugar control after surgery is 80-180 mg/dL.   WHAT DO I DO ABOUT MY DIABETES MEDICATION?  Do not take oral diabetes medicines (pills) the morning of surgery.  THE DAY BEFORE SURGERY, take Metformin as prescribed.      THE MORNING OF SURGERY, do not take Metformin.   Reviewed and Endorsed by [facility] Patient Education Committee, August 2015   Bring CPAP mask and tubing day of surgery.                              You may not have any metal on your body including hair pins, jewelry, and body piercing             Do not wear make-up, lotions, powders, perfumes, or deodorant  Do not wear nail polish including gel and S&S, artificial/acrylic nails, or any other type of covering on natural nails including finger and toenails. If you have artificial nails, gel coating, etc. that needs to be removed by a nail salon please have this removed prior to surgery or surgery may need to be canceled/ delayed if the surgeon/ anesthesia feels like they are unable to be safely monitored.   Do not shave  48 hours prior to surgery.    Do not bring valuables to the hospital.  IS NOT             RESPONSIBLE   FOR  VALUABLES.   Contacts, dentures or bridgework may not be worn into surgery.   Bring small overnight bag day of surgery.   DO NOT BRING YOUR HOME MEDICATIONS TO THE HOSPITAL. PHARMACY WILL DISPENSE MEDICATIONS LISTED ON YOUR MEDICATION LIST TO YOU DURING YOUR ADMISSION IN THE HOSPITAL!    Special Instructions: Bring a copy of your healthcare power of attorney and living will documents         the day of surgery if you haven't scanned them before.              Please read  over the following fact sheets you were given: IF YOU HAVE QUESTIONS ABOUT YOUR PRE-OP INSTRUCTIONS PLEASE CALL [phone]- [facility] - Preparing for Surgery Before surgery, you can play an important role.  Because skin is not sterile, your skin needs to be as free of germs as possible.  You can reduce the number of germs on your skin by washing with CHG (chlorahexidine gluconate) soap before surgery.  CHG is an antiseptic cleaner which kills germs and bonds with the skin to continue killing germs even after washing. Please DO NOT use if you have an allergy to CHG or antibacterial soaps.  If your skin becomes reddened/irritated stop using the CHG and inform your nurse when you arrive at Short Stay. Do not shave (including legs and underarms) for at least 48 hours prior to the first CHG shower.  You may shave your face/neck.  Please follow these instructions carefully:  1.  Shower with CHG Soap the night before surgery and the  morning of surgery.  2.  If you choose to wash your hair, wash your hair first as usual with your normal  shampoo.  3.  After you shampoo, rinse your hair and body thoroughly to remove the shampoo.                             4.  Use CHG as you would any other liquid soap.  You can apply chg directly to the skin and wash.  Gently with a scrungie or clean washcloth.  5.  Apply the CHG Soap to your body ONLY FROM THE NECK DOWN.   Do   not use on face/ open                           Wound or open sores. Avoid contact with eyes, ears mouth and   genitals (private parts).                       Wash face,  Genitals (private parts) with your normal soap.             6.  Wash thoroughly, paying special attention to the area where your    surgery  will be performed.  7.  Thoroughly rinse your body with warm water from the neck down.  8.  DO NOT shower/wash with your normal soap after using and rinsing off the CHG Soap.                9.  Pat yourself dry with a clean  towel.            10.  Wear clean pajamas.            11.  Place clean sheets on your bed the night of  your first shower and do not  sleep with pets. Day of Surgery : Do not apply any lotions/deodorants the morning of surgery.  Please wear clean clothes to the hospital/surgery center.  FAILURE TO FOLLOW THESE INSTRUCTIONS MAY RESULT IN THE CANCELLATION OF YOUR SURGERY  PATIENT SIGNATURE_________________________________  NURSE SIGNATURE__________________________________  ________________________________________________________________________   [name] [name]  An incentive spirometer is a tool that can help keep your lungs clear and active. This tool measures how well you are filling your lungs with each breath. Taking long deep breaths may help reverse or decrease the chance of developing breathing (pulmonary) problems (especially infection) following: A long period of time when you are unable to move or be active. BEFORE THE PROCEDURE  If the spirometer includes an indicator to show your best effort, your nurse or respiratory therapist will set it to a desired goal. If possible, sit up straight or lean slightly forward. Try not to slouch. Hold the incentive spirometer in an upright position. INSTRUCTIONS FOR USE  Sit on the edge of your bed if possible, or sit up as far as you can in bed or on a chair. Hold the incentive spirometer in an upright position. Breathe out normally. Place the mouthpiece in your mouth and seal your lips tightly around it. Breathe in slowly and as deeply as possible, raising the piston or the ball toward the top of the column. Hold your breath for 3-5 seconds or for as long as possible. Allow the piston or ball to fall to the bottom of the column. Remove the mouthpiece from your mouth and breathe out normally. Rest for a few seconds and repeat Steps 1 through 7 at least 10 times every 1-2 hours when you are awake. Take your time and take a few normal  breaths between deep breaths. The spirometer may include an indicator to show your best effort. Use the indicator as a goal to work toward during each repetition. After each set of 10 deep breaths, practice coughing to be sure your lungs are clear. If you have an incision (the cut made at the time of surgery), support your incision when coughing by placing a pillow or rolled up towels firmly against it. Once you are able to get out of bed, walk around indoors and cough well. You may stop using the incentive spirometer when instructed by your caregiver.  RISKS AND COMPLICATIONS Take your time so you do not get dizzy or light-headed. If you are in pain, you may need to take or ask for pain medication before doing incentive spirometry. It is harder to take a deep breath if you are having pain. AFTER USE Rest and breathe slowly and easily. It can be helpful to keep track of a log of your progress. Your caregiver can provide you with a simple table to help with this. If you are using the spirometer at home, follow these instructions: SEEK MEDICAL CARE IF:  You are having difficultly using the spirometer. You have trouble using the spirometer as often as instructed. Your pain medication is not giving enough relief while using the spirometer. You develop fever of 100.5 F (38.1 C) or higher. SEEK IMMEDIATE MEDICAL CARE IF:  You cough up bloody sputum that had not been present before. You develop fever of 102 F (38.9 C) or greater. You develop worsening pain at or near the incision site. MAKE SURE YOU:  Understand these instructions. Will watch your condition. Will get help right away if you are not doing well or  get worse. Document Released: 10/23/2006 Document Revised: 09/04/2011 Document Reviewed: 12/24/2006 Exit[employer] Patient Information 2014 [employer], [place].   ________________________________________________________________________  WHAT IS A BLOOD TRANSFUSION? Blood Transfusion  Information  A transfusion is the replacement of blood or some of its parts. Blood is made up of multiple cells which provide different functions. Red blood cells carry oxygen and are used for blood loss replacement. White blood cells fight against infection. Platelets control bleeding. Plasma helps clot blood. Other blood products are available for specialized needs, such as hemophilia or other clotting disorders. BEFORE THE TRANSFUSION  Who gives blood for transfusions?  Healthy volunteers who are fully evaluated to make sure their blood is safe. This is blood bank blood. Transfusion therapy is the safest it has ever been in the practice of medicine. Before blood is taken from a donor, a complete history is taken to make sure that person has no history of diseases nor engages in risky social behavior (examples are intravenous drug use or sexual activity with multiple partners). The donor's travel history is screened to minimize risk of transmitting infections, such as malaria. The donated blood is tested for signs of infectious diseases, such as HIV and hepatitis. The blood is then tested to be sure it is compatible with you in order to minimize the chance of a transfusion reaction. If you or a relative donates blood, this is often done in anticipation of surgery and is not appropriate for emergency situations. It takes many days to process the donated blood. RISKS AND COMPLICATIONS Although transfusion therapy is very safe and saves many lives, the main dangers of transfusion include:  Getting an infectious disease. Developing a transfusion reaction. This is an allergic reaction to something in the blood you were given. Every precaution is taken to prevent this. The decision to have a blood transfusion has been considered carefully by your caregiver before blood is given. Blood is not given unless the benefits outweigh the risks. AFTER THE TRANSFUSION Right after receiving a blood transfusion,  you will usually feel much better and more energetic. This is especially true if your red blood cells have gotten low (anemic). The transfusion raises the level of the red blood cells which carry oxygen, and this usually causes an energy increase. The nurse administering the transfusion will monitor you carefully for complications. HOME CARE INSTRUCTIONS  No special instructions are needed after a transfusion. You may find your energy is better. Speak with your caregiver about any limitations on activity for underlying diseases you may have. SEEK MEDICAL CARE IF:  Your condition is not improving after your transfusion. You develop redness or irritation at the intravenous (IV) site. SEEK IMMEDIATE MEDICAL CARE IF:  Any of the following symptoms occur over the next 12 hours: Shaking chills. You have a temperature by mouth above 102 F (38.9 C), not controlled by medicine. Chest, back, or muscle pain. People around you feel you are not acting correctly or are confused. Shortness of breath or difficulty breathing. Dizziness and fainting. You get a rash or develop hives. You have a decrease in urine output. Your urine turns a dark color or changes to pink, red, or brown. Any of the following symptoms occur over the next 10 days: You have a temperature by mouth above 102 F (38.9 C), not controlled by medicine. Shortness of breath. Weakness after normal activity. The white part of the eye turns yellow (jaundice). You have a decrease in the amount of urine or are urinating less often. Your urine  turns a dark color or changes to pink, red, or brown. Document Released: 06/09/2000 Document Revised: 09/04/2011 Document Reviewed: 01/27/2008 [facility] Patient Information 2014 [place], [place].  _______________________________________________________________________

## 2021-12-09 NOTE — Progress Notes (Signed)
COVID Vaccine Completed:  Date of COVID positive in last 90 days:  PCP - Quintin Alto, MD Cardiologist -   Chest x-ray - 01/27/21 Epic EKG - 11/12/21 req Stress Test - 12/28/20 CE ECHO - 03/31/20 Epic Cardiac Cath - 04/28/15 Epic Pacemaker/ICD device last checked: Spinal Cord Stimulator:  Bowel Prep -   Sleep Study -  CPAP -   Fasting Blood Sugar -  Checks Blood Sugar _____ times a day  Blood Thinner Instructions: Aspirin Instructions: ASA 81 Last Dose:  Activity level:  Can go up a flight of stairs and perform activities of daily living without stopping and without symptoms of chest pain or shortness of breath.  Able to exercise without symptoms  Unable to go up a flight of stairs without symptoms of     Anesthesia review: HTN, MI, CAD, CKD, OSA, DM 2, aortic valve replacement  Patient denies shortness of breath, fever, cough and chest pain at PAT appointment  Patient verbalized understanding of instructions that were given to them at the PAT appointment. Patient was also instructed that they will need to review over the PAT instructions again at home before surgery.

## 2021-12-12 ENCOUNTER — Encounter (HOSPITAL_COMMUNITY)
Admission: RE | Admit: 2021-12-12 | Discharge: 2021-12-12 | Disposition: A | Discharge status: Home / Self Care | Payer: Medicare HMO | Source: Ambulatory Visit | Attending: Orthopaedic Surgery | Admitting: Orthopaedic Surgery

## 2021-12-12 ENCOUNTER — Encounter (HOSPITAL_COMMUNITY): Payer: Self-pay

## 2021-12-12 VITALS — BP 147/79 | HR 65 | Temp 98.5°F | Resp 14 | Ht 67.0 in | Wt 217.0 lb

## 2021-12-12 DIAGNOSIS — I2510 Atherosclerotic heart disease of native coronary artery without angina pectoris: Secondary | ICD-10-CM | POA: Insufficient documentation

## 2021-12-12 DIAGNOSIS — K219 Gastro-esophageal reflux disease without esophagitis: Secondary | ICD-10-CM | POA: Insufficient documentation

## 2021-12-12 DIAGNOSIS — N182 Chronic kidney disease, stage 2 (mild): Secondary | ICD-10-CM | POA: Diagnosis not present

## 2021-12-12 DIAGNOSIS — I129 Hypertensive chronic kidney disease with stage 1 through stage 4 chronic kidney disease, or unspecified chronic kidney disease: Secondary | ICD-10-CM | POA: Insufficient documentation

## 2021-12-12 DIAGNOSIS — M1711 Unilateral primary osteoarthritis, right knee: Secondary | ICD-10-CM | POA: Diagnosis not present

## 2021-12-12 DIAGNOSIS — Z01812 Encounter for preprocedural laboratory examination: Secondary | ICD-10-CM | POA: Diagnosis not present

## 2021-12-12 DIAGNOSIS — E1122 Type 2 diabetes mellitus with diabetic chronic kidney disease: Secondary | ICD-10-CM | POA: Insufficient documentation

## 2021-12-12 DIAGNOSIS — Z955 Presence of coronary angioplasty implant and graft: Secondary | ICD-10-CM | POA: Diagnosis not present

## 2021-12-12 DIAGNOSIS — R6889 Other general symptoms and signs: Secondary | ICD-10-CM | POA: Diagnosis not present

## 2021-12-12 DIAGNOSIS — I252 Old myocardial infarction: Secondary | ICD-10-CM | POA: Diagnosis not present

## 2021-12-12 DIAGNOSIS — G4730 Sleep apnea, unspecified: Secondary | ICD-10-CM | POA: Insufficient documentation

## 2021-12-12 DIAGNOSIS — E119 Type 2 diabetes mellitus without complications: Secondary | ICD-10-CM

## 2021-12-12 DIAGNOSIS — Z01818 Encounter for other preprocedural examination: Secondary | ICD-10-CM

## 2021-12-12 HISTORY — DX: Dizziness and giddiness: R42

## 2021-12-12 LAB — BASIC METABOLIC PANEL
Anion gap: 7 (ref 5–15)
BUN: 13 mg/dL (ref 8–23)
CO2: 28 mmol/L (ref 22–32)
Calcium: 10 mg/dL (ref 8.9–10.3)
Chloride: 109 mmol/L (ref 98–111)
Creatinine, Ser: 1.02 mg/dL — ABNORMAL HIGH (ref 0.44–1.00)
GFR, Estimated: 58 mL/min — ABNORMAL LOW (ref >60)
Glucose, Bld: 102 mg/dL — ABNORMAL HIGH (ref 70–99)
Potassium: 4.3 mmol/L (ref 3.5–5.1)
Sodium: 144 mmol/L (ref 135–145)

## 2021-12-12 LAB — HEMOGLOBIN A1C
Hgb A1c MFr Bld: 5.7 % — ABNORMAL HIGH (ref 4.8–5.6)
Mean Plasma Glucose: 116.89 mg/dL

## 2021-12-12 LAB — CBC
HCT: 41.3 % (ref 36.0–46.0)
Hemoglobin: 13.4 g/dL (ref 12.0–15.0)
MCH: 30.6 pg (ref 26.0–34.0)
MCHC: 32.4 g/dL (ref 30.0–36.0)
MCV: 94.3 fL (ref 80.0–100.0)
Platelets: 230 10*3/uL (ref 150–400)
RBC: 4.38 MIL/uL (ref 3.87–5.11)
RDW: 13.5 % (ref 11.5–15.5)
WBC: 4.8 10*3/uL (ref 4.0–10.5)
nRBC: 0 % (ref 0.0–0.2)

## 2021-12-12 LAB — GLUCOSE, CAPILLARY: Glucose-Capillary: 98 mg/dL (ref 70–99)

## 2021-12-12 LAB — SURGICAL PCR SCREEN
MRSA, PCR: NEGATIVE
Staphylococcus aureus: NEGATIVE

## 2021-12-13 NOTE — Progress Notes (Addendum)
Anesthesia Chart Review   Case: [id number] Date/Time: 12/16/21 1036   Procedure: RIGHT TOTAL KNEE ARTHROPLASTY (Right: Knee)   Anesthesia type: Choice   Pre-op diagnosis: OSTEOARTHRITIS / DEGENERATIVE JOINT DISEASE RIGHT KNEE   Location: [facility] ROOM 10 / [facility]   Surgeons: Kathryne Hitch, MD       DISCUSSION:71 y.o. never smoker with h/o GERD, HTN, sleep apnea, CKD Stage II, DM II, CAD (NSTEMI, s/p cutting balloon atherectomy/DES [facility] 01/01/03), s/p aortic valve replacement, right knee OA scheduled for above procedure 12/16/2021 with Dr. Doneen Poisson.   Spinal surgery (ACDF; L3-4 fusion with hardware 01/31/2021, L4-5 fusion 01/08/04; L5-S1 fusion 06/22/09).   Low risk stress test 12/28/2020.  VS: BP (!) 147/79   Pulse 65   Temp 36.9 C (Oral)   Resp 14   Ht 5\' 7"  (1.702 m)   Wt 98.4 kg   SpO2 100%   BMI 33.99 kg/m   PROVIDERS: Burdine, , MD is PCP    LABS: Labs reviewed: Acceptable for surgery. (all labs ordered are listed, but only abnormal results are displayed)  Labs Reviewed  HEMOGLOBIN A1C - Abnormal; Notable for the following components:      Result Value   Hgb A1c MFr Bld 5.7 (*)    All other components within normal limits  BASIC METABOLIC PANEL - Abnormal; Notable for the following components:   Glucose, Bld 102 (*)    Creatinine, Ser 1.02 (*)    GFR, Estimated 58 (*)    All other components within normal limits  SURGICAL PCR SCREEN  CBC  GLUCOSE, CAPILLARY  TYPE AND SCREEN     IMAGES:   EKG: 01/26/2021 Rate 72 bpm  Normal sinus rhythm Cannot rule out Anterior infarct , age undetermined Nonspecific lateral T wave inversion No significant change since last tracing  CV: Cardiac Cath 04/28/2015 1. Widely patent RCA stent with no significant obstruction in that vessel. 2. Widely patent left main, LAD, and left circumflex 3. Normal LV function by echo 4. Normal LVEDP   Suspect noncardiac symptoms.  Echo 04/01/2015 Study Conclusions    - Left ventricle: The cavity size was normal. Wall thickness was    increased in a pattern of mild LVH. Systolic function was    vigorous. The estimated ejection fraction was in the range of 65%    to 70%. Wall motion was normal; there were no regional wall    motion abnormalities. Doppler parameters are consistent with    abnormal left ventricular relaxation (grade 1 diastolic    dysfunction).  - Aortic valve: Trileaflet; mildly calcified leaflets. Left    coronary cusp mobility was mildly restricted.  - Mitral valve: Heavily calcified annulus. There was trivial    regurgitation.  - Left atrium: The atrium was mildly dilated.  - Right atrium: Central venous pressure (est): 3 mm Hg.  - Atrial septum: No defect or patent foramen ovale was identified.  - Tricuspid valve: There was trivial regurgitation.  - Pulmonary arteries: PA peak pressure: 25 mm Hg (S).  - Pericardium, extracardiac: There was no pericardial effusion.  Past Medical History:  Diagnosis Date   Arthritis    Carpal tunnel syndrome    Chronic kidney disease (CKD), stage II (mild)    Constipation    Coronary artery disease    NSTEMI 2004, DES to ostial RCA   DDD (degenerative disc disease)    Essential hypertension    GERD (gastroesophageal reflux disease)    Hyperlipidemia    IBS (  irritable bowel syndrome)    Low back pain    Myocardial infarction [facility]) 2004   Postmenopausal    Pulmonary nodule    Sleep apnea    "waiting on machine to come".   Type 2 diabetes mellitus (HCC)    Vertigo     Past Surgical History:  Procedure Laterality Date   ABDOMINAL HYSTERECTOMY     partial   CARDIAC CATHETERIZATION N/A 04/28/2015   Procedure: Left Heart Cath and Coronary Angiography;  Surgeon: Tonny Bollman, MD;  Location: [facility] INVASIVE CV LAB;  Service: Cardiovascular;  Laterality: N/A;   CATARACT EXTRACTION W/PHACO Right 07/19/2020   Procedure: CATARACT EXTRACTION PHACO AND INTRAOCULAR LENS PLACEMENT RIGHT EYE;   Surgeon: Fabio Pierce, MD;  Location: [facility];  Service: Ophthalmology;  Laterality: Right;  right CDE=12.56   CATARACT EXTRACTION W/PHACO Left 08/09/2020   Procedure: CATARACT EXTRACTION PHACO AND INTRAOCULAR LENS PLACEMENT (IOC);  Surgeon: Fabio Pierce, MD;  Location: [facility];  Service: Ophthalmology;  Laterality: Left;  CDE: 6.66   Cervical disectomy and fusion     Cholestectomy     COLONOSCOPY N/A 05/07/2019   Procedure: COLONOSCOPY;  Surgeon: Corbin Ade, MD;  Location: [facility] SU[facility];  Service: Endoscopy;  Laterality: N/A;  12:00   Excision of ovarian cyst     L4-5 with pedicle screws and rods     x2   MULTIPLE TOOTH EXTRACTIONS     Right shoulder RTC      MEDICATIONS:  acetaminophen (TYLENOL) 500 MG tablet   amLODipine (NORVASC) 10 MG tablet   aspirin 81 MG tablet   atorvastatin (LIPITOR) 40 MG tablet   Cholecalciferol (VITAMIN D) 50 MCG (2000 UT) tablet   diclofenac Sodium (VOLTAREN) 1 % GEL   diphenhydramine-acetaminophen (TYLENOL PM) 25-500 MG TABS tablet   famotidine (PEPCID) 20 MG tablet   gabapentin (NEURONTIN) 300 MG capsule   gabapentin (NEURONTIN) 400 MG capsule   hydrochlorothiazide (HYDRODIURIL) 25 MG tablet   HYDROcodone-acetaminophen (NORCO) 10-325 MG tablet   linaclotide (LINZESS) 72 MCG capsule   lisinopril (PRINIVIL,ZESTRIL) 40 MG tablet   meclizine (ANTIVERT) 12.5 MG tablet   metFORMIN (GLUCOPHAGE) 500 MG tablet   pregabalin (LYRICA) 50 MG capsule   No current facility-administered medications for this encounter.     Jodell Cipro Ward, PA-C [facility] Pre-Surgical Testing [phone]

## 2021-12-14 ENCOUNTER — Telehealth: Payer: Self-pay | Admitting: *Deleted

## 2021-12-14 NOTE — Telephone Encounter (Signed)
Attempted Ortho bundle pre-op call to patient; left VM requesting call back.

## 2021-12-15 ENCOUNTER — Telehealth: Payer: Self-pay | Admitting: *Deleted

## 2021-12-15 NOTE — Telephone Encounter (Signed)
Ortho bundle pre-op call completed. 

## 2021-12-15 NOTE — H&P (Signed)
TOTAL KNEE ADMISSION H&P  Patient is being admitted for right total knee arthroplasty.  Subjective:  Chief Complaint:right knee pain.  HPI: [name] [name], 71 y.o. female, has a history of pain and functional disability in the right knee due to arthritis and has failed non-surgical conservative treatments for greater than 12 weeks to includeNSAID's and/or analgesics, corticosteriod injections, use of assistive devices, weight reduction as appropriate, and activity modification.  Onset of symptoms was gradual, starting 1 years ago with gradually worsening course since that time. The patient noted no past surgery on the right knee(s).  Patient currently rates pain in the right knee(s) at 10 out of 10 with activity. Patient has night pain, worsening of pain with activity and weight bearing, pain that interferes with activities of daily living, pain with passive range of motion, crepitus, and joint swelling.  Patient has evidence of subchondral sclerosis, periarticular osteophytes, and joint space narrowing by imaging studies. There is no active infection.  Patient Active Problem List   Diagnosis Date Noted   Unilateral primary osteoarthritis, right knee 10/12/2021   Chronic RLQ pain 04/29/2021   Periumbilical pain 04/29/2021   Spinal stenosis of lumbar region with neurogenic claudication    Spondylolisthesis, lumbar region    Other spondylosis with radiculopathy, lumbar region    Acute postoperative anemia due to expected blood loss 02/01/2021   Fusion of spine of lumbar region 01/31/2021   S/P lumbar fusion L4 to Sacrum 06/22/09 10/11/2020   Myalgia and myositis 06/12/2018   Other and unspecified nonspecific immunological findings 04/25/2018   Osteoarthrosis, hand 10/19/2017   Insomnia 09/23/2015   Plantar fascial fibromatosis 09/23/2015   Shortness of breath 04/28/2015   Abnormal myocardial perfusion study    Disturbance of skin sensation 08/05/2014   Chronic kidney disease, stage III  (moderate) (HCC) 02/02/2014   Impaired fasting glucose 02/02/2014   Vitamin D deficiency 10/30/2013   Other osteoporosis 07/29/2012   Myocardial infarction (HCC) 12/29/2010   Cardiovascular disease 05/26/2009   KNEE PAIN, LEFT 07/02/2008   HIP PAIN 04/27/2008   OBESITY 02/24/2008   HYPERGLYCEMIA, FASTING 02/10/2008   GERD 03/12/2007   ANXIETY DEPRESSION 08/28/2006   DEGENERATIVE DISC DISEASE 08/28/2006   HYPERLIPIDEMIA 05/29/2006   CARPAL TUNNEL SYNDROME 05/29/2006   HYPERTENSION 05/29/2006   Unspecified constipation 05/29/2006   IBS 05/29/2006   ARTHRITIS 05/29/2006   LOW BACK PAIN 05/29/2006   Past Medical History:  Diagnosis Date   Arthritis    Carpal tunnel syndrome    Chronic kidney disease (CKD), stage II (mild)    Constipation    Coronary artery disease    NSTEMI 2004, DES to ostial RCA   DDD (degenerative disc disease)    Essential hypertension    GERD (gastroesophageal reflux disease)    Hyperlipidemia    IBS (irritable bowel syndrome)    Low back pain    Myocardial infarction (HCC) 2004   Postmenopausal    Pulmonary nodule    Sleep apnea    "waiting on machine to come".   Type 2 diabetes mellitus (HCC)    Vertigo     Past Surgical History:  Procedure Laterality Date   ABDOMINAL HYSTERECTOMY     partial   CARDIAC CATHETERIZATION N/A 04/28/2015   Procedure: Left Heart Cath and Coronary Angiography;  Surgeon: Tonny Bollman, MD;  Location: [facility] INVASIVE CV LAB;  Service: Cardiovascular;  Laterality: N/A;   CATARACT EXTRACTION W/PHACO Right 07/19/2020   Procedure: CATARACT EXTRACTION PHACO AND INTRAOCULAR LENS PLACEMENT RIGHT EYE;  Surgeon: Fabio Pierce, MD;  Location: [facility] ORS;  Service: Ophthalmology;  Laterality: Right;  right CDE=12.56   CATARACT EXTRACTION W/PHACO Left 08/09/2020   Procedure: CATARACT EXTRACTION PHACO AND INTRAOCULAR LENS PLACEMENT (IOC);  Surgeon: Fabio Pierce, MD;  Location: [facility] ORS;  Service: Ophthalmology;  Laterality: Left;  CDE:  6.66   Cervical disectomy and fusion     Cholestectomy     COLONOSCOPY N/A 05/07/2019   Procedure: COLONOSCOPY;  Surgeon: Corbin Ade, MD;  Location: [facility] ENDO SUITE;  Service: Endoscopy;  Laterality: N/A;  12:00   Excision of ovarian cyst     L4-5 with pedicle screws and rods     x2   MULTIPLE TOOTH EXTRACTIONS     Right shoulder RTC      No current facility-administered medications for this encounter.   Current Outpatient Medications  Medication Sig Dispense Refill Last Dose   acetaminophen (TYLENOL) 500 MG tablet Take 1,000 mg by mouth every 6 (six) hours as needed for moderate pain or headache.      amLODipine (NORVASC) 10 MG tablet Take 10 mg by mouth daily.      aspirin 81 MG tablet Take 81 mg by mouth daily.      atorvastatin (LIPITOR) 40 MG tablet Take 40 mg by mouth daily.      diclofenac Sodium (VOLTAREN) 1 % GEL Apply 1 application  topically 4 (four) times daily as needed for pain.      diphenhydramine-acetaminophen (TYLENOL PM) 25-500 MG TABS tablet Take 2 tablets by mouth at bedtime as needed (sleep).      famotidine (PEPCID) 20 MG tablet Take 20 mg by mouth daily as needed for heartburn or indigestion.      gabapentin (NEURONTIN) 400 MG capsule Take 400 mg by mouth 3 (three) times daily.      hydrochlorothiazide (HYDRODIURIL) 25 MG tablet Take 25 mg by mouth daily.      lisinopril (PRINIVIL,ZESTRIL) 40 MG tablet Take 40 mg by mouth daily.      meclizine (ANTIVERT) 12.5 MG tablet Take 12.5 mg by mouth 3 (three) times daily as needed for dizziness.      metFORMIN (GLUCOPHAGE) 500 MG tablet Take 500 mg by mouth daily.      Cholecalciferol (VITAMIN D) 50 MCG (2000 UT) tablet Take 2,000 Units by mouth daily.      gabapentin (NEURONTIN) 300 MG capsule Take 1 capsule (300 mg total) by mouth 3 (three) times daily. (Patient not taking: Reported on 12/06/2021) 90 capsule 3 Not Taking   HYDROcodone-acetaminophen (NORCO) 10-325 MG tablet Take 0.5 tablets by mouth every 6 (six) hours  as needed. (Patient not taking: Reported on 12/06/2021) 14 tablet 0 Completed Course   linaclotide (LINZESS) 72 MCG capsule Take one capsule daily to every other day before breakfast for constipation. (Patient not taking: Reported on 12/06/2021) 30 capsule 1 Not Taking   pregabalin (LYRICA) 50 MG capsule Take 1 capsule (50 mg total) by mouth 2 (two) times daily. (Patient not taking: Reported on 12/06/2021) 60 capsule 0 Not Taking   No Known Allergies  Social History   Tobacco Use   Smoking status: Never   Smokeless tobacco: Never  Substance Use Topics   Alcohol use: No    Alcohol/week: 0.0 standard drinks of alcohol    Family History  Problem Relation Age of Onset   Stroke Mother    Diabetes Mellitus II Mother    Kidney disease Father    Hypertension Brother    Diabetes Mellitus  II Brother    Diabetes Mellitus II Sister      Review of Systems  Objective:  Physical Exam Vitals reviewed.  Constitutional:      Appearance: Normal appearance.  HENT:     Head: Normocephalic and atraumatic.  Eyes:     Extraocular Movements: Extraocular movements intact.     Pupils: Pupils are equal, round, and reactive to light.  Cardiovascular:     Rate and Rhythm: Normal rate.     Pulses: Normal pulses.  Pulmonary:     Effort: Pulmonary effort is normal.  Abdominal:     Palpations: Abdomen is soft.  Musculoskeletal:     Cervical back: Normal range of motion.     Right knee: Effusion, bony tenderness and crepitus present. Decreased range of motion. Tenderness present over the medial joint line, lateral joint line and patellar tendon.  Neurological:     Mental Status: She is alert and oriented to person, place, and time.  Psychiatric:        Behavior: Behavior normal.     Vital signs in last 24 hours:    Labs:   Estimated body mass index is 33.99 kg/m as calculated from the following:   Height as of 12/12/21: 5\' 7"  (1.702 m).   Weight as of 12/12/21: 98.4 kg.   Imaging  Review Plain radiographs demonstrate severe degenerative joint disease of the right knee(s). The overall alignment isneutral. The bone quality appears to be good for age and reported activity level.      Assessment/Plan:  End stage arthritis, right knee   The patient history, physical examination, clinical judgment of the provider and imaging studies are consistent with end stage degenerative joint disease of the right knee(s) and total knee arthroplasty is deemed medically necessary. The treatment options including medical management, injection therapy arthroscopy and arthroplasty were discussed at length. The risks and benefits of total knee arthroplasty were presented and reviewed. The risks due to aseptic loosening, infection, stiffness, patella tracking problems, thromboembolic complications and other imponderables were discussed. The patient acknowledged the explanation, agreed to proceed with the plan and consent was signed. Patient is being admitted for inpatient treatment for surgery, pain control, PT, OT, prophylactic antibiotics, VTE prophylaxis, progressive ambulation and ADL's and discharge planning. The patient is planning to be discharged to skilled nursing facility

## 2021-12-15 NOTE — Care Plan (Signed)
[facility] RNCM call to patient and discussed her upcoming Right total knee arthroplasty with Dr. Magnus Ivan on 12/16/21. She is an Ortho bundle patient through [facility] and is agreeable to case management. She lives alone, but has a son nearby that assists as needed. She states he and his family work and cannot provide care for her at home after discharge. Discussed going to SNF for STR for approximately 1 week post op as she has no other options. She has a RW and toilet riser at home. No other DME needed for this surgery. Reviewed all post op care instructions and will continue to follow for needs.

## 2021-12-16 ENCOUNTER — Inpatient Hospital Stay (HOSPITAL_COMMUNITY)
Admission: AD | Admit: 2021-12-16 | Discharge: 2021-12-20 | DRG: 470 | Disposition: A | Discharge status: Skilled Nursing Facility | Payer: Medicare HMO | Attending: Orthopaedic Surgery | Admitting: Orthopaedic Surgery

## 2021-12-16 ENCOUNTER — Encounter (HOSPITAL_COMMUNITY): Payer: Self-pay | Admitting: Orthopaedic Surgery

## 2021-12-16 ENCOUNTER — Ambulatory Visit (HOSPITAL_COMMUNITY): Payer: Medicare HMO | Admitting: Physician Assistant

## 2021-12-16 ENCOUNTER — Ambulatory Visit (HOSPITAL_COMMUNITY): Payer: Medicare HMO

## 2021-12-16 ENCOUNTER — Ambulatory Visit (HOSPITAL_BASED_OUTPATIENT_CLINIC_OR_DEPARTMENT_OTHER): Payer: Medicare HMO | Admitting: Certified Registered Nurse Anesthetist

## 2021-12-16 ENCOUNTER — Other Ambulatory Visit: Payer: Self-pay

## 2021-12-16 ENCOUNTER — Encounter (HOSPITAL_COMMUNITY)
Admission: AD | Disposition: A | Discharge status: Skilled Nursing Facility | Payer: Self-pay | Source: Home / Self Care | Attending: Orthopaedic Surgery

## 2021-12-16 DIAGNOSIS — G8918 Other acute postprocedural pain: Secondary | ICD-10-CM | POA: Diagnosis not present

## 2021-12-16 DIAGNOSIS — G4730 Sleep apnea, unspecified: Secondary | ICD-10-CM | POA: Diagnosis present

## 2021-12-16 DIAGNOSIS — Z833 Family history of diabetes mellitus: Secondary | ICD-10-CM

## 2021-12-16 DIAGNOSIS — I10 Essential (primary) hypertension: Secondary | ICD-10-CM | POA: Diagnosis not present

## 2021-12-16 DIAGNOSIS — K219 Gastro-esophageal reflux disease without esophagitis: Secondary | ICD-10-CM | POA: Diagnosis present

## 2021-12-16 DIAGNOSIS — E1122 Type 2 diabetes mellitus with diabetic chronic kidney disease: Secondary | ICD-10-CM | POA: Diagnosis present

## 2021-12-16 DIAGNOSIS — Z841 Family history of disorders of kidney and ureter: Secondary | ICD-10-CM

## 2021-12-16 DIAGNOSIS — M1711 Unilateral primary osteoarthritis, right knee: Secondary | ICD-10-CM

## 2021-12-16 DIAGNOSIS — F32A Depression, unspecified: Secondary | ICD-10-CM | POA: Diagnosis present

## 2021-12-16 DIAGNOSIS — Z90710 Acquired absence of both cervix and uterus: Secondary | ICD-10-CM

## 2021-12-16 DIAGNOSIS — F419 Anxiety disorder, unspecified: Secondary | ICD-10-CM | POA: Diagnosis present

## 2021-12-16 DIAGNOSIS — Z79899 Other long term (current) drug therapy: Secondary | ICD-10-CM

## 2021-12-16 DIAGNOSIS — M25461 Effusion, right knee: Secondary | ICD-10-CM | POA: Diagnosis present

## 2021-12-16 DIAGNOSIS — Z96651 Presence of right artificial knee joint: Secondary | ICD-10-CM

## 2021-12-16 DIAGNOSIS — Z8249 Family history of ischemic heart disease and other diseases of the circulatory system: Secondary | ICD-10-CM

## 2021-12-16 DIAGNOSIS — Z6838 Body mass index (BMI) 38.0-38.9, adult: Secondary | ICD-10-CM | POA: Diagnosis not present

## 2021-12-16 DIAGNOSIS — R41841 Cognitive communication deficit: Secondary | ICD-10-CM | POA: Diagnosis not present

## 2021-12-16 DIAGNOSIS — Z7982 Long term (current) use of aspirin: Secondary | ICD-10-CM

## 2021-12-16 DIAGNOSIS — K589 Irritable bowel syndrome without diarrhea: Secondary | ICD-10-CM | POA: Diagnosis present

## 2021-12-16 DIAGNOSIS — N183 Chronic kidney disease, stage 3 unspecified: Secondary | ICD-10-CM

## 2021-12-16 DIAGNOSIS — G8929 Other chronic pain: Secondary | ICD-10-CM | POA: Diagnosis present

## 2021-12-16 DIAGNOSIS — M6281 Muscle weakness (generalized): Secondary | ICD-10-CM | POA: Diagnosis not present

## 2021-12-16 DIAGNOSIS — I129 Hypertensive chronic kidney disease with stage 1 through stage 4 chronic kidney disease, or unspecified chronic kidney disease: Secondary | ICD-10-CM

## 2021-12-16 DIAGNOSIS — I2510 Atherosclerotic heart disease of native coronary artery without angina pectoris: Secondary | ICD-10-CM

## 2021-12-16 DIAGNOSIS — M25561 Pain in right knee: Secondary | ICD-10-CM | POA: Diagnosis present

## 2021-12-16 DIAGNOSIS — N182 Chronic kidney disease, stage 2 (mild): Secondary | ICD-10-CM | POA: Diagnosis present

## 2021-12-16 DIAGNOSIS — D631 Anemia in chronic kidney disease: Secondary | ICD-10-CM | POA: Diagnosis not present

## 2021-12-16 DIAGNOSIS — E1140 Type 2 diabetes mellitus with diabetic neuropathy, unspecified: Secondary | ICD-10-CM | POA: Diagnosis not present

## 2021-12-16 DIAGNOSIS — Z7984 Long term (current) use of oral hypoglycemic drugs: Secondary | ICD-10-CM | POA: Diagnosis not present

## 2021-12-16 DIAGNOSIS — E785 Hyperlipidemia, unspecified: Secondary | ICD-10-CM | POA: Diagnosis present

## 2021-12-16 DIAGNOSIS — G5600 Carpal tunnel syndrome, unspecified upper limb: Secondary | ICD-10-CM | POA: Diagnosis present

## 2021-12-16 DIAGNOSIS — R531 Weakness: Secondary | ICD-10-CM | POA: Diagnosis not present

## 2021-12-16 DIAGNOSIS — M810 Age-related osteoporosis without current pathological fracture: Secondary | ICD-10-CM | POA: Diagnosis present

## 2021-12-16 DIAGNOSIS — I252 Old myocardial infarction: Secondary | ICD-10-CM | POA: Diagnosis not present

## 2021-12-16 DIAGNOSIS — E669 Obesity, unspecified: Secondary | ICD-10-CM | POA: Diagnosis present

## 2021-12-16 DIAGNOSIS — Z961 Presence of intraocular lens: Secondary | ICD-10-CM | POA: Diagnosis present

## 2021-12-16 DIAGNOSIS — Z981 Arthrodesis status: Secondary | ICD-10-CM

## 2021-12-16 DIAGNOSIS — Z471 Aftercare following joint replacement surgery: Secondary | ICD-10-CM | POA: Diagnosis not present

## 2021-12-16 DIAGNOSIS — E559 Vitamin D deficiency, unspecified: Secondary | ICD-10-CM | POA: Diagnosis present

## 2021-12-16 DIAGNOSIS — Z743 Need for continuous supervision: Secondary | ICD-10-CM | POA: Diagnosis not present

## 2021-12-16 DIAGNOSIS — E119 Type 2 diabetes mellitus without complications: Secondary | ICD-10-CM

## 2021-12-16 DIAGNOSIS — Z9842 Cataract extraction status, left eye: Secondary | ICD-10-CM

## 2021-12-16 DIAGNOSIS — M19049 Primary osteoarthritis, unspecified hand: Secondary | ICD-10-CM | POA: Diagnosis present

## 2021-12-16 DIAGNOSIS — R1031 Right lower quadrant pain: Secondary | ICD-10-CM | POA: Diagnosis present

## 2021-12-16 DIAGNOSIS — Z20822 Contact with and (suspected) exposure to covid-19: Secondary | ICD-10-CM | POA: Diagnosis present

## 2021-12-16 DIAGNOSIS — N189 Chronic kidney disease, unspecified: Secondary | ICD-10-CM | POA: Diagnosis not present

## 2021-12-16 DIAGNOSIS — R2689 Other abnormalities of gait and mobility: Secondary | ICD-10-CM | POA: Diagnosis not present

## 2021-12-16 DIAGNOSIS — Z9841 Cataract extraction status, right eye: Secondary | ICD-10-CM

## 2021-12-16 HISTORY — PX: TOTAL KNEE ARTHROPLASTY: SHX125

## 2021-12-16 LAB — TYPE AND SCREEN
ABO/RH(D): B POS
Antibody Screen: NEGATIVE

## 2021-12-16 LAB — GLUCOSE, CAPILLARY
Glucose-Capillary: 99 mg/dL (ref 70–99)
Glucose-Capillary: 124 mg/dL — ABNORMAL HIGH (ref 70–99)

## 2021-12-16 SURGERY — ARTHROPLASTY, KNEE, TOTAL
Anesthesia: Monitor Anesthesia Care | Site: Knee | Laterality: Right

## 2021-12-16 MED ORDER — PHENYLEPHRINE HCL-NACL 20-0.9 MG/250ML-% IV SOLN
INTRAVENOUS | Status: DC | PRN
  Administered 2021-12-16: 20 ug/min via INTRAVENOUS

## 2021-12-16 MED ORDER — HYDROMORPHONE HCL 1 MG/ML IJ SOLN: 0.5000 mg | INTRAMUSCULAR | Status: DC | PRN

## 2021-12-16 MED ORDER — TRANEXAMIC ACID-NACL 1000-0.7 MG/100ML-% IV SOLN
1000.0000 mg | INTRAVENOUS | Status: AC
  Administered 2021-12-16: 1000 mg via INTRAVENOUS
  Filled 2021-12-16: qty 100

## 2021-12-16 MED ORDER — ONDANSETRON HCL 4 MG/2ML IJ SOLN
INTRAMUSCULAR | Status: AC
  Filled 2021-12-16: qty 2

## 2021-12-16 MED ORDER — PROPOFOL 10 MG/ML IV BOLUS
INTRAVENOUS | Status: DC | PRN
  Administered 2021-12-16: 140 mg via INTRAVENOUS

## 2021-12-16 MED ORDER — ONDANSETRON HCL 4 MG/2ML IJ SOLN
INTRAMUSCULAR | Status: DC | PRN
  Administered 2021-12-16: 4 mg via INTRAVENOUS

## 2021-12-16 MED ORDER — ORAL CARE MOUTH RINSE: 15.0000 mL | Freq: Once | OROMUCOSAL | Status: AC

## 2021-12-16 MED ORDER — FENTANYL CITRATE PF 50 MCG/ML IJ SOSY: 25.0000 ug | PREFILLED_SYRINGE | INTRAMUSCULAR | Status: DC | PRN

## 2021-12-16 MED ORDER — MEPERIDINE HCL 50 MG/ML IJ SOLN: 6.2500 mg | INTRAMUSCULAR | Status: DC | PRN

## 2021-12-16 MED ORDER — VITAMIN D 50 MCG (2000 UT) PO TABS: 2000.0000 [IU] | ORAL_TABLET | Freq: Every day | ORAL | Status: DC

## 2021-12-16 MED ORDER — 0.9 % SODIUM CHLORIDE (POUR BTL) OPTIME
TOPICAL | Status: DC | PRN
  Administered 2021-12-16: 1000 mL

## 2021-12-16 MED ORDER — PANTOPRAZOLE SODIUM 40 MG PO TBEC
40.0000 mg | DELAYED_RELEASE_TABLET | Freq: Every day | ORAL | Status: DC
  Administered 2021-12-16 – 2021-12-20 (×5): 40 mg via ORAL
  Filled 2021-12-16 (×5): qty 1

## 2021-12-16 MED ORDER — ONDANSETRON HCL 4 MG/2ML IJ SOLN: 4.0000 mg | Freq: Four times a day (QID) | INTRAMUSCULAR | Status: DC | PRN

## 2021-12-16 MED ORDER — HYDROCHLOROTHIAZIDE 25 MG PO TABS
25.0000 mg | ORAL_TABLET | Freq: Every day | ORAL | Status: DC
  Administered 2021-12-16 – 2021-12-19 (×4): 25 mg via ORAL
  Filled 2021-12-16 (×4): qty 1

## 2021-12-16 MED ORDER — FENTANYL CITRATE (PF) 100 MCG/2ML IJ SOLN
INTRAMUSCULAR | Status: AC
  Filled 2021-12-16: qty 2

## 2021-12-16 MED ORDER — METOCLOPRAMIDE HCL 5 MG PO TABS: 5.0000 mg | ORAL_TABLET | Freq: Three times a day (TID) | ORAL | Status: DC | PRN

## 2021-12-16 MED ORDER — FENTANYL CITRATE PF 50 MCG/ML IJ SOSY
PREFILLED_SYRINGE | INTRAMUSCULAR | Status: AC
  Administered 2021-12-16: 100 ug via INTRAVENOUS
  Filled 2021-12-16: qty 2

## 2021-12-16 MED ORDER — CEFAZOLIN SODIUM-DEXTROSE 1-4 GM/50ML-% IV SOLN
1.0000 g | Freq: Four times a day (QID) | INTRAVENOUS | Status: AC
  Administered 2021-12-16 – 2021-12-17 (×2): 1 g via INTRAVENOUS
  Filled 2021-12-16 (×2): qty 50

## 2021-12-16 MED ORDER — ACETAMINOPHEN 160 MG/5ML PO SOLN: 325.0000 mg | ORAL | Status: DC | PRN

## 2021-12-16 MED ORDER — ATORVASTATIN CALCIUM 40 MG PO TABS
40.0000 mg | ORAL_TABLET | Freq: Every day | ORAL | Status: DC
  Administered 2021-12-16 – 2021-12-20 (×5): 40 mg via ORAL
  Filled 2021-12-16 (×5): qty 1

## 2021-12-16 MED ORDER — DOCUSATE SODIUM 100 MG PO CAPS
100.0000 mg | ORAL_CAPSULE | Freq: Two times a day (BID) | ORAL | Status: DC
  Administered 2021-12-16 – 2021-12-20 (×8): 100 mg via ORAL
  Filled 2021-12-16 (×8): qty 1

## 2021-12-16 MED ORDER — SODIUM CHLORIDE 0.9 % IR SOLN
Status: DC | PRN
  Administered 2021-12-16: 1000 mL

## 2021-12-16 MED ORDER — HYDROMORPHONE HCL 2 MG PO TABS
2.0000 mg | ORAL_TABLET | ORAL | Status: DC | PRN
  Administered 2021-12-17 (×2): 2 mg via ORAL
  Filled 2021-12-16 (×2): qty 1

## 2021-12-16 MED ORDER — CHLORHEXIDINE GLUCONATE 0.12 % MT SOLN
15.0000 mL | Freq: Once | OROMUCOSAL | Status: AC
  Administered 2021-12-16: 15 mL via OROMUCOSAL

## 2021-12-16 MED ORDER — AMLODIPINE BESYLATE 10 MG PO TABS
10.0000 mg | ORAL_TABLET | Freq: Every day | ORAL | Status: DC
  Administered 2021-12-17 – 2021-12-20 (×4): 10 mg via ORAL
  Filled 2021-12-16 (×4): qty 1

## 2021-12-16 MED ORDER — MECLIZINE HCL 25 MG PO TABS
12.5000 mg | ORAL_TABLET | Freq: Three times a day (TID) | ORAL | Status: DC | PRN
Start: 2021-12-16 — End: 2021-12-20

## 2021-12-16 MED ORDER — METOCLOPRAMIDE HCL 5 MG/ML IJ SOLN: 5.0000 mg | Freq: Three times a day (TID) | INTRAMUSCULAR | Status: DC | PRN

## 2021-12-16 MED ORDER — BUPIVACAINE-EPINEPHRINE (PF) 0.25% -1:200000 IJ SOLN
INTRAMUSCULAR | Status: AC
  Filled 2021-12-16: qty 30

## 2021-12-16 MED ORDER — ACETAMINOPHEN 325 MG PO TABS
325.0000 mg | ORAL_TABLET | Freq: Four times a day (QID) | ORAL | Status: DC | PRN
  Administered 2021-12-18 – 2021-12-20 (×5): 650 mg via ORAL
  Filled 2021-12-16 (×5): qty 2

## 2021-12-16 MED ORDER — LIDOCAINE 2% (20 MG/ML) 5 ML SYRINGE
INTRAMUSCULAR | Status: DC | PRN
  Administered 2021-12-16: 100 mg via INTRAVENOUS

## 2021-12-16 MED ORDER — ONDANSETRON HCL 4 MG/2ML IJ SOLN: 4.0000 mg | Freq: Once | INTRAMUSCULAR | Status: DC | PRN

## 2021-12-16 MED ORDER — ONDANSETRON HCL 4 MG PO TABS: 4.0000 mg | ORAL_TABLET | Freq: Four times a day (QID) | ORAL | Status: DC | PRN

## 2021-12-16 MED ORDER — FENTANYL CITRATE (PF) 100 MCG/2ML IJ SOLN
INTRAMUSCULAR | Status: DC | PRN
  Administered 2021-12-16 (×2): 50 ug via INTRAVENOUS
  Administered 2021-12-16 (×2): 25 ug via INTRAVENOUS
  Administered 2021-12-16: 50 ug via INTRAVENOUS

## 2021-12-16 MED ORDER — MENTHOL 3 MG MT LOZG: 1.0000 | LOZENGE | OROMUCOSAL | Status: DC | PRN

## 2021-12-16 MED ORDER — BUPIVACAINE-EPINEPHRINE 0.25% -1:200000 IJ SOLN
INTRAMUSCULAR | Status: DC | PRN
  Administered 2021-12-16: 20 mL

## 2021-12-16 MED ORDER — CEFAZOLIN SODIUM-DEXTROSE 2-4 GM/100ML-% IV SOLN
2.0000 g | INTRAVENOUS | Status: AC
  Administered 2021-12-16: 2 g via INTRAVENOUS
  Filled 2021-12-16: qty 100

## 2021-12-16 MED ORDER — VITAMIN D 25 MCG (1000 UNIT) PO TABS
2000.0000 [IU] | ORAL_TABLET | Freq: Every day | ORAL | Status: DC
  Administered 2021-12-17 – 2021-12-20 (×4): 2000 [IU] via ORAL
  Filled 2021-12-16 (×4): qty 2

## 2021-12-16 MED ORDER — OXYCODONE HCL 5 MG PO TABS: 5.0000 mg | ORAL_TABLET | Freq: Once | ORAL | Status: DC | PRN

## 2021-12-16 MED ORDER — PROPOFOL 1000 MG/100ML IV EMUL
INTRAVENOUS | Status: AC
  Filled 2021-12-16: qty 100

## 2021-12-16 MED ORDER — LISINOPRIL 20 MG PO TABS
40.0000 mg | ORAL_TABLET | Freq: Every day | ORAL | Status: DC
  Administered 2021-12-16 – 2021-12-19 (×4): 40 mg via ORAL
  Filled 2021-12-16 (×4): qty 2

## 2021-12-16 MED ORDER — METFORMIN HCL 500 MG PO TABS
500.0000 mg | ORAL_TABLET | Freq: Every day | ORAL | Status: DC
  Administered 2021-12-17 – 2021-12-20 (×4): 500 mg via ORAL
  Filled 2021-12-16 (×4): qty 1

## 2021-12-16 MED ORDER — ASPIRIN 81 MG PO CHEW
81.0000 mg | CHEWABLE_TABLET | Freq: Two times a day (BID) | ORAL | Status: DC
  Administered 2021-12-16 – 2021-12-20 (×8): 81 mg via ORAL
  Filled 2021-12-16 (×8): qty 1

## 2021-12-16 MED ORDER — MIDAZOLAM HCL 2 MG/2ML IJ SOLN
INTRAMUSCULAR | Status: AC
  Filled 2021-12-16: qty 2

## 2021-12-16 MED ORDER — OXYCODONE HCL 5 MG PO TABS
5.0000 mg | ORAL_TABLET | ORAL | Status: DC | PRN
  Administered 2021-12-16: 5 mg via ORAL
  Administered 2021-12-17 – 2021-12-18 (×5): 10 mg via ORAL
  Administered 2021-12-19 – 2021-12-20 (×2): 5 mg via ORAL
  Filled 2021-12-16: qty 2
  Filled 2021-12-16: qty 1
  Filled 2021-12-16 (×2): qty 2
  Filled 2021-12-16: qty 1
  Filled 2021-12-16: qty 2
  Filled 2021-12-16: qty 1
  Filled 2021-12-16: qty 2
  Filled 2021-12-16: qty 1
  Filled 2021-12-16: qty 2
  Filled 2021-12-16: qty 1

## 2021-12-16 MED ORDER — SODIUM CHLORIDE 0.9 % IV SOLN: INTRAVENOUS | Status: DC

## 2021-12-16 MED ORDER — EPHEDRINE SULFATE-NACL 50-0.9 MG/10ML-% IV SOSY
PREFILLED_SYRINGE | INTRAVENOUS | Status: DC | PRN
  Administered 2021-12-16: 10 mg via INTRAVENOUS

## 2021-12-16 MED ORDER — DIPHENHYDRAMINE HCL 12.5 MG/5ML PO ELIX: 12.5000 mg | ORAL_SOLUTION | ORAL | Status: DC | PRN

## 2021-12-16 MED ORDER — FENTANYL CITRATE PF 50 MCG/ML IJ SOSY: 100.0000 ug | PREFILLED_SYRINGE | Freq: Once | INTRAMUSCULAR | Status: AC

## 2021-12-16 MED ORDER — METHOCARBAMOL 500 MG PO TABS
500.0000 mg | ORAL_TABLET | Freq: Four times a day (QID) | ORAL | Status: DC | PRN
  Administered 2021-12-16 – 2021-12-18 (×4): 500 mg via ORAL
  Filled 2021-12-16 (×4): qty 1

## 2021-12-16 MED ORDER — DEXAMETHASONE SODIUM PHOSPHATE 10 MG/ML IJ SOLN
INTRAMUSCULAR | Status: DC | PRN
  Administered 2021-12-16: 5 mg via INTRAVENOUS

## 2021-12-16 MED ORDER — ACETAMINOPHEN 325 MG PO TABS: 325.0000 mg | ORAL_TABLET | ORAL | Status: DC | PRN

## 2021-12-16 MED ORDER — GABAPENTIN 300 MG PO CAPS
300.0000 mg | ORAL_CAPSULE | Freq: Three times a day (TID) | ORAL | Status: DC
  Administered 2021-12-16 – 2021-12-19 (×8): 300 mg via ORAL
  Filled 2021-12-16 (×9): qty 1

## 2021-12-16 MED ORDER — PHENOL 1.4 % MT LIQD
1.0000 | OROMUCOSAL | Status: DC | PRN
Start: 2021-12-16 — End: 2021-12-20

## 2021-12-16 MED ORDER — ROPIVACAINE HCL 7.5 MG/ML IJ SOLN
INTRAMUSCULAR | Status: DC | PRN
  Administered 2021-12-16: 25 mL via PERINEURAL

## 2021-12-16 MED ORDER — POVIDONE-IODINE 10 % EX SWAB
2.0000 | Freq: Once | CUTANEOUS | Status: AC
  Administered 2021-12-16: 2 via TOPICAL

## 2021-12-16 MED ORDER — DEXAMETHASONE SODIUM PHOSPHATE 10 MG/ML IJ SOLN
INTRAMUSCULAR | Status: AC
  Filled 2021-12-16: qty 1

## 2021-12-16 MED ORDER — METHOCARBAMOL 500 MG IVPB - SIMPLE MED: 500.0000 mg | Freq: Four times a day (QID) | INTRAVENOUS | Status: DC | PRN

## 2021-12-16 MED ORDER — ALUM & MAG HYDROXIDE-SIMETH 200-200-20 MG/5ML PO SUSP: 30.0000 mL | ORAL | Status: DC | PRN

## 2021-12-16 MED ORDER — POLYETHYLENE GLYCOL 3350 17 G PO PACK
17.0000 g | PACK | Freq: Every day | ORAL | Status: DC | PRN
  Administered 2021-12-20: 17 g via ORAL
  Filled 2021-12-16: qty 1

## 2021-12-16 MED ORDER — OXYCODONE HCL 5 MG/5ML PO SOLN: 5.0000 mg | Freq: Once | ORAL | Status: DC | PRN

## 2021-12-16 MED ORDER — LACTATED RINGERS IV SOLN: INTRAVENOUS | Status: DC

## 2021-12-16 MED ORDER — MIDAZOLAM HCL 2 MG/2ML IJ SOLN: 2.0000 mg | Freq: Once | INTRAMUSCULAR | Status: DC

## 2021-12-16 SURGICAL SUPPLY — 64 items
APL SKNCLS STERI-STRIP NONHPOA (GAUZE/BANDAGES/DRESSINGS)
BAG COUNTER SPONGE SURGICOUNT (BAG) ×1 IMPLANT
BAG SPEC THK2 15X12 ZIP CLS (MISCELLANEOUS) ×1
BAG SPNG CNTER NS LX DISP (BAG) ×1
BAG ZIPLOCK 12X15 (MISCELLANEOUS) ×2 IMPLANT
BENZOIN TINCTURE PRP APPL 2/3 (GAUZE/BANDAGES/DRESSINGS) IMPLANT
BLADE SAG 18X100X1.27 (BLADE) ×2 IMPLANT
BLADE SURG SZ10 CARB STEEL (BLADE) ×4 IMPLANT
BNDG ELASTIC 6X5.8 VLCR STR LF (GAUZE/BANDAGES/DRESSINGS) ×3 IMPLANT
BOWL SMART MIX CTS (DISPOSABLE) IMPLANT
BSPLAT TIB 5D E CMNT KN RT (Knees) ×1 IMPLANT
CEMENT BONE R 1X40 (Cement) ×2 IMPLANT
COOLER ICEMAN CLASSIC (MISCELLANEOUS) ×2 IMPLANT
COVER SURGICAL LIGHT HANDLE (MISCELLANEOUS) ×2 IMPLANT
CUFF TOURN SGL QUICK 34 (TOURNIQUET CUFF) ×2
CUFF TRNQT CYL 34X4.125X (TOURNIQUET CUFF) ×1 IMPLANT
DRAPE INCISE IOBAN 66X45 STRL (DRAPES) ×2 IMPLANT
DRAPE U-SHAPE 47X51 STRL (DRAPES) ×2 IMPLANT
DRSG PAD ABDOMINAL 8X10 ST (GAUZE/BANDAGES/DRESSINGS) ×4 IMPLANT
DURAPREP 26ML APPLICATOR (WOUND CARE) ×2 IMPLANT
ELECT BLADE TIP CTD 4 INCH (ELECTRODE) ×2 IMPLANT
ELECT REM PT RETURN 15FT ADLT (MISCELLANEOUS) ×2 IMPLANT
FEMUR CMT CR STD SZ 6 RT KNEE (Joint) ×2 IMPLANT
FEMUR CMTD CR STD SZ 6 RT KNEE (Joint) IMPLANT
GAUZE PAD ABD 8X10 STRL (GAUZE/BANDAGES/DRESSINGS) ×1 IMPLANT
GAUZE SPONGE 4X4 12PLY STRL (GAUZE/BANDAGES/DRESSINGS) ×2 IMPLANT
GAUZE XEROFORM 1X8 LF (GAUZE/BANDAGES/DRESSINGS) ×1 IMPLANT
GLOVE BIO SURGEON STRL SZ7.5 (GLOVE) ×2 IMPLANT
GLOVE BIOGEL PI IND STRL 8 (GLOVE) ×2 IMPLANT
GLOVE BIOGEL PI INDICATOR 8 (GLOVE) ×2
GLOVE ECLIPSE 8.0 STRL XLNG CF (GLOVE) ×2 IMPLANT
GOWN STRL REUS W/ TWL XL LVL3 (GOWN DISPOSABLE) ×2 IMPLANT
GOWN STRL REUS W/TWL XL LVL3 (GOWN DISPOSABLE) ×4
HANDPIECE INTERPULSE COAX TIP (DISPOSABLE) ×2
HDLS TROCR DRIL PIN KNEE 75 (PIN) ×2
HOLDER FOLEY CATH W/STRAP (MISCELLANEOUS) ×1 IMPLANT
IMMOBILIZER KNEE 20 (SOFTGOODS) ×2
IMMOBILIZER KNEE 20 THIGH 36 (SOFTGOODS) ×1 IMPLANT
INSERT TIB ARTISURF SZ6-7 (Insert) ×1 IMPLANT
KIT TURNOVER KIT A (KITS) ×1 IMPLANT
NS IRRIG 1000ML POUR BTL (IV SOLUTION) ×2 IMPLANT
PACK TOTAL KNEE CUSTOM (KITS) ×2 IMPLANT
PAD COLD SHLDR WRAP-ON (PAD) ×2 IMPLANT
PADDING CAST COTTON 6X4 STRL (CAST SUPPLIES) ×3 IMPLANT
PIN DRILL HDLS TROCAR 75 4PK (PIN) IMPLANT
PROTECTOR NERVE ULNAR (MISCELLANEOUS) ×2 IMPLANT
SCREW FEMALE HEX FIX 25X2.5 (ORTHOPEDIC DISPOSABLE SUPPLIES) ×2 IMPLANT
SET HNDPC FAN SPRY TIP SCT (DISPOSABLE) ×1 IMPLANT
SET PAD KNEE POSITIONER (MISCELLANEOUS) ×2 IMPLANT
SPIKE FLUID TRANSFER (MISCELLANEOUS) IMPLANT
STAPLER VISISTAT 35W (STAPLE) IMPLANT
STEM POLY PAT PLY 35M KNEE (Knees) ×1 IMPLANT
STEM TIBIA 5 DEG SZ E R KNEE (Knees) IMPLANT
STRIP CLOSURE SKIN 1/2X4 (GAUZE/BANDAGES/DRESSINGS) IMPLANT
SUT MNCRL AB 4-0 PS2 18 (SUTURE) IMPLANT
SUT VIC AB 0 CT1 27 (SUTURE) ×2
SUT VIC AB 0 CT1 27XBRD ANTBC (SUTURE) ×1 IMPLANT
SUT VIC AB 1 CT1 36 (SUTURE) ×4 IMPLANT
SUT VIC AB 2-0 CT1 27 (SUTURE) ×4
SUT VIC AB 2-0 CT1 TAPERPNT 27 (SUTURE) ×2 IMPLANT
TIBIA STEM 5 DEG SZ E R KNEE (Knees) ×2 IMPLANT
TRAY FOLEY MTR SLVR 16FR STAT (SET/KITS/TRAYS/PACK) IMPLANT
WATER STERILE IRR 1000ML POUR (IV SOLUTION) ×4 IMPLANT
WRAP KNEE MAXI GEL POST OP (GAUZE/BANDAGES/DRESSINGS) ×1 IMPLANT

## 2021-12-16 NOTE — Anesthesia Preprocedure Evaluation (Addendum)
Anesthesia Evaluation  Patient identified by MRN, date of birth, ID band Patient awake    Reviewed: Allergy & Precautions, NPO status , Patient's Chart, lab work & pertinent test results  Airway Mallampati: I  TM Distance: >3 FB Neck ROM: Full    Dental  (+) Edentulous Upper, Edentulous Lower, Dental Advisory Given   Pulmonary sleep apnea ,    breath sounds clear to auscultation       Cardiovascular hypertension, Pt. on medications + CAD and + Past MI   Rhythm:Regular Rate:Normal  EKG: 01/26/2021 Rate 72 bpm  Normal sinus rhythm Cannot rule out Anterior infarct , age undetermined Nonspecific lateral T wave inversion No significant change since last tracing  CV: Cardiac Cath 04/28/2015 1. Widely patent RCA stent with no significant obstruction in that vessel. 2. Widely patent left main, LAD, and left circumflex 3. Normal LV function by echo 4. Normal LVEDP  Suspect noncardiac symptoms.  Echo 04/01/2015 Study Conclusions   - Left ventricle: The cavity size was normal. Wall thickness was  increased in a pattern of mild LVH. Systolic function was  vigorous. The estimated ejection fraction was in the range of 65%  to 70%. Wall motion was normal; there were no regional wall  motion abnormalities. Doppler parameters are consistent with  abnormal left ventricular relaxation (grade 1 diastolic  dysfunction).  - Aortic valve: Trileaflet; mildly calcified leaflets. Left  coronary cusp mobility was mildly restricted.  - Mitral valve: Heavily calcified annulus. There was trivial  regurgitation.    Neuro/Psych PSYCHIATRIC DISORDERS Depression    GI/Hepatic Neg liver ROS, GERD  Medicated,  Endo/Other  diabetes, Type 2, Oral Hypoglycemic Agents  Renal/GU CRFRenal disease     Musculoskeletal  (+) Arthritis , Osteoarthritis,    Abdominal Normal abdominal exam  (+)   Peds  Hematology  (+) Blood  dyscrasia, anemia ,   Anesthesia Other Findings   Reproductive/Obstetrics                            Anesthesia Physical  Anesthesia Plan  ASA: 3  Anesthesia Plan: General   Post-op Pain Management: Minimal or no pain anticipated   Induction: Intravenous  PONV Risk Score and Plan: 3 and Ondansetron, Midazolam and Treatment may vary due to age or medical condition  Airway Management Planned: Natural Airway, Nasal Cannula and Simple Face Mask  Additional Equipment: None  Intra-op Plan:   Post-operative Plan: Extubation in OR  Informed Consent: I have reviewed the patients History and Physical, chart, labs and discussed the procedure including the risks, benefits and alternatives for the proposed anesthesia with the patient or authorized representative who has indicated his/her understanding and acceptance.     Dental advisory given  Plan Discussed with: CRNA and Anesthesiologist  Anesthesia Plan Comments: (PAT note written 01/27/2021 by Shonna Chock, PA-C. DISCUSSION:71 y.o. never smoker with h/o GERD, HTN, sleep apnea, CKD Stage II, DM II, CAD (NSTEMI, s/p cutting balloon atherectomy/DES [facility] 01/01/03), s/p aortic valve replacement, right knee OA scheduled for above procedure 12/16/2021 with Dr. Doneen Poisson.   Spinal surgery (ACDF; L3-4 fusion with hardware 01/31/2021, L4-5 fusion 01/08/04; L5-S1 fusion 06/22/09).   Low risk stress test 12/28/2020. )      Anesthesia Quick Evaluation

## 2021-12-16 NOTE — Interval H&P Note (Signed)
History and Physical Interval Note: The patient understands that she is here today for a right knee replacement to treat her right knee osteoarthritis.  There has been no acute or interval change in her medical status.  See H&P.  The risks and benefits of surgery been explained in detail and informed consent is obtained.  The left operative knee has been marked.  12/16/2021 10:41 AM  [name] [name]  has presented today for surgery, with the diagnosis of OSTEOARTHRITIS / DEGENERATIVE JOINT DISEASE RIGHT KNEE.  The various methods of treatment have been discussed with the patient and family. After consideration of risks, benefits and other options for treatment, the patient has consented to  Procedure(s): RIGHT TOTAL KNEE ARTHROPLASTY (Right) as a surgical intervention.  The patient's history has been reviewed, patient examined, no change in status, stable for surgery.  I have reviewed the patient's chart and labs.  Questions were answered to the patient's satisfaction.     Kathryne Hitch

## 2021-12-16 NOTE — Brief Op Note (Signed)
12/16/2021  1:19 PM  PATIENT:  Kathi Ludwig  71 y.o. female  PRE-OPERATIVE DIAGNOSIS:  OSTEOARTHRITIS / DEGENERATIVE JOINT DISEASE RIGHT KNEE  POST-OPERATIVE DIAGNOSIS:  DEGENERATIVE JOINT DISEASE RIGHT KNEE  PROCEDURE:  Procedure(s): RIGHT TOTAL KNEE ARTHROPLASTY (Right)  SURGEON:  Surgeon(s) and Role:    * Kathryne Hitch, MD - Primary  PHYSICIAN ASSISTANT:  Rexene Edison, PA-C  ANESTHESIA:   local and general  EBL:  25 mL   COUNTS:  YES  TOURNIQUET:   Total Tourniquet Time Documented: Thigh (Right) - 48 minutes Total: Thigh (Right) - 48 minutes   DICTATION: .Other Dictation: Dictation Number [id number]  PATIENT DISPOSITION:  PACU - hemodynamically stable.   Delay start of Pharmacological VTE agent (>24hrs) due to surgical blood loss or risk of bleeding: no

## 2021-12-16 NOTE — Anesthesia Procedure Notes (Addendum)
  Anesthesia Regional Block: Adductor canal block   Pre-Anesthetic Checklist: , timeout performed,  Correct Patient, Correct Site, Correct Laterality,  Correct Procedure, Correct Position, site marked,  Risks and benefits discussed,  Surgical consent,  Pre-op evaluation,  At surgeon's request and post-op pain management  Laterality: Right  Prep: chloraprep       Needles:  Injection technique: Single-shot  Needle Type: Echogenic Stimulator Needle     Needle Length: 5cm  Needle Gauge: 22     Additional Needles:   Procedures:, nerve stimulator,,, ultrasound used (permanent image in chart),,     Nerve Stimulator or Paresthesia:  Response: quadraceps contraction, 0.45 mA  Additional Responses:   Narrative:  Start time: 12/16/2021 11:00 AM End time: 12/16/2021 11:06 AM Injection made incrementally with aspirations every 5 mL.  Performed by: Personally  Anesthesiologist: Bethena Midget, MD  Additional Notes: Functioning IV was confirmed and monitors were applied.  A 50mm 22ga Arrow echogenic stimulator needle was used. Sterile prep and drape,hand hygiene and sterile gloves were used. Ultrasound guidance: relevant anatomy identified, needle position confirmed, local anesthetic spread visualized around nerve(s)., vascular puncture avoided.  Image printed for medical record. Negative aspiration and negative test dose prior to incremental administration of local anesthetic. The patient tolerated the procedure well.

## 2021-12-17 DIAGNOSIS — M25461 Effusion, right knee: Secondary | ICD-10-CM | POA: Diagnosis present

## 2021-12-17 DIAGNOSIS — M810 Age-related osteoporosis without current pathological fracture: Secondary | ICD-10-CM | POA: Diagnosis present

## 2021-12-17 DIAGNOSIS — K219 Gastro-esophageal reflux disease without esophagitis: Secondary | ICD-10-CM | POA: Diagnosis present

## 2021-12-17 DIAGNOSIS — Z841 Family history of disorders of kidney and ureter: Secondary | ICD-10-CM | POA: Diagnosis not present

## 2021-12-17 DIAGNOSIS — F419 Anxiety disorder, unspecified: Secondary | ICD-10-CM | POA: Diagnosis present

## 2021-12-17 DIAGNOSIS — Z981 Arthrodesis status: Secondary | ICD-10-CM | POA: Diagnosis not present

## 2021-12-17 DIAGNOSIS — Z20822 Contact with and (suspected) exposure to covid-19: Secondary | ICD-10-CM | POA: Diagnosis present

## 2021-12-17 DIAGNOSIS — M19049 Primary osteoarthritis, unspecified hand: Secondary | ICD-10-CM | POA: Diagnosis present

## 2021-12-17 DIAGNOSIS — I252 Old myocardial infarction: Secondary | ICD-10-CM | POA: Diagnosis not present

## 2021-12-17 DIAGNOSIS — N182 Chronic kidney disease, stage 2 (mild): Secondary | ICD-10-CM | POA: Diagnosis present

## 2021-12-17 DIAGNOSIS — G5600 Carpal tunnel syndrome, unspecified upper limb: Secondary | ICD-10-CM | POA: Diagnosis present

## 2021-12-17 DIAGNOSIS — Z90710 Acquired absence of both cervix and uterus: Secondary | ICD-10-CM | POA: Diagnosis not present

## 2021-12-17 DIAGNOSIS — E559 Vitamin D deficiency, unspecified: Secondary | ICD-10-CM | POA: Diagnosis present

## 2021-12-17 DIAGNOSIS — E1122 Type 2 diabetes mellitus with diabetic chronic kidney disease: Secondary | ICD-10-CM | POA: Diagnosis present

## 2021-12-17 DIAGNOSIS — E669 Obesity, unspecified: Secondary | ICD-10-CM | POA: Diagnosis present

## 2021-12-17 DIAGNOSIS — I2510 Atherosclerotic heart disease of native coronary artery without angina pectoris: Secondary | ICD-10-CM | POA: Diagnosis present

## 2021-12-17 DIAGNOSIS — E785 Hyperlipidemia, unspecified: Secondary | ICD-10-CM | POA: Diagnosis present

## 2021-12-17 DIAGNOSIS — F32A Depression, unspecified: Secondary | ICD-10-CM | POA: Diagnosis present

## 2021-12-17 DIAGNOSIS — M1711 Unilateral primary osteoarthritis, right knee: Secondary | ICD-10-CM | POA: Diagnosis present

## 2021-12-17 DIAGNOSIS — I129 Hypertensive chronic kidney disease with stage 1 through stage 4 chronic kidney disease, or unspecified chronic kidney disease: Secondary | ICD-10-CM | POA: Diagnosis present

## 2021-12-17 DIAGNOSIS — G4730 Sleep apnea, unspecified: Secondary | ICD-10-CM | POA: Diagnosis present

## 2021-12-17 DIAGNOSIS — Z8249 Family history of ischemic heart disease and other diseases of the circulatory system: Secondary | ICD-10-CM | POA: Diagnosis not present

## 2021-12-17 DIAGNOSIS — M25561 Pain in right knee: Secondary | ICD-10-CM | POA: Diagnosis present

## 2021-12-17 DIAGNOSIS — Z6838 Body mass index (BMI) 38.0-38.9, adult: Secondary | ICD-10-CM | POA: Diagnosis not present

## 2021-12-17 DIAGNOSIS — Z833 Family history of diabetes mellitus: Secondary | ICD-10-CM | POA: Diagnosis not present

## 2021-12-17 LAB — CBC
HCT: 35.8 % — ABNORMAL LOW (ref 36.0–46.0)
Hemoglobin: 11.7 g/dL — ABNORMAL LOW (ref 12.0–15.0)
MCH: 30.8 pg (ref 26.0–34.0)
MCHC: 32.7 g/dL (ref 30.0–36.0)
MCV: 94.2 fL (ref 80.0–100.0)
Platelets: 177 10*3/uL (ref 150–400)
RBC: 3.8 MIL/uL — ABNORMAL LOW (ref 3.87–5.11)
RDW: 13.4 % (ref 11.5–15.5)
WBC: 9 10*3/uL (ref 4.0–10.5)
nRBC: 0 % (ref 0.0–0.2)

## 2021-12-17 NOTE — Progress Notes (Signed)
Subjective: 1 Day Post-Op Procedure(s) (LRB): RIGHT TOTAL KNEE ARTHROPLASTY (Right) Patient reports pain as moderate.  Awake and alert and follows commands appropriately.  Objective: Vital signs in last 24 hours: Temp:  [97.7 F (36.5 C)-98.4 F (36.9 C)] 97.7 F (36.5 C) (06/24 0642) Pulse Rate:  [51-74] 57 (06/24 0642) Resp:  [11-24] 20 (06/24 0642) BP: (114-176)/(64-102) 147/71 (06/24 0642) SpO2:  [93 %-100 %] 98 % (06/24 0642) Weight:  [112.7 kg] 112.7 kg (06/23 1608)  Intake/Output from previous day: 06/23 0701 - 06/24 0700 In: 2348.2 [P.O.:240; I.V.:1858.2; IV Piggyback:250] Out: 825 [Urine:800; Blood:25] Intake/Output this shift: No intake/output data recorded.  Recent Labs    12/17/21 0403  HGB 11.7*   Recent Labs    12/17/21 0403  WBC 9.0  RBC 3.80*  HCT 35.8*  PLT 177   No results for input(s): "NA", "K", "CL", "CO2", "BUN", "CREATININE", "GLUCOSE", "CALCIUM" in the last 72 hours. No results for input(s): "LABPT", "INR" in the last 72 hours.  Sensation intact distally Intact pulses distally Dorsiflexion/Plantar flexion intact Incision: dressing C/D/I Compartment soft   Assessment/Plan: 1 Day Post-Op Procedure(s) (LRB): RIGHT TOTAL KNEE ARTHROPLASTY (Right) Up with therapy Discharge to SNF early next week.      Kathryne Hitch 12/17/2021, 9:00 AM

## 2021-12-17 NOTE — Progress Notes (Signed)
Physical Therapy Treatment Patient Details Name: [name] [name] MRN: [id number] DOB: 1951/01/08 Today's Date: 12/17/2021   History of Present Illness Pt s/p R TKR and with hx of CKD, CAD, DDD< MI, DM, Lumbar fusion, and Vertigo    PT Comments    Pt continues very cooperative and up to ambulate increased (but still limited) distance in hall before transferring to [facility] and then to bed.  Pt positioned for comfort, RN alerted for pain meds and ice packs applied.  Recommendations for follow up therapy are one component of a multi-disciplinary discharge planning process, led by the attending physician.  Recommendations may be updated based on patient status, additional functional criteria and insurance authorization.  Follow Up Recommendations  Skilled nursing-short term rehab (<3 hours/day) Can patient physically be transported by private vehicle: No   Assistance Recommended at Discharge Frequent or constant Supervision/Assistance  Patient can return home with the following A lot of help with walking and/or transfers;A little help with bathing/dressing/bathroom;Assistance with cooking/housework;Assist for transportation;Help with stairs or ramp for entrance   Equipment Recommendations  None recommended by PT    Recommendations for Other Services       Precautions / Restrictions Precautions Precautions: Knee;Fall Required Braces or Orthoses: Knee Immobilizer - Right Knee Immobilizer - Right: Discontinue once straight leg raise with < 10 degree lag Restrictions Weight Bearing Restrictions: No Other Position/Activity Restrictions: WBAT     Mobility  Bed Mobility Overal bed mobility: Needs Assistance Bed Mobility: Sit to Supine     Supine to sit: Min assist, +2 for physical assistance, +2 for safety/equipment, HOB elevated Sit to supine: Min assist, Mod assist, +2 for physical assistance, +2 for safety/equipment   General bed mobility comments: cues for sequence and use of L LE to  self assist    Transfers Overall transfer level: Needs assistance Equipment used: Rolling walker (2 wheels) Transfers: Sit to/from Stand, Bed to chair/wheelchair/BSC Sit to Stand: Mod assist, +2 physical assistance, +2 safety/equipment, From elevated surface   Step pivot transfers: Min assist, Mod assist, +2 physical assistance, +2 safety/equipment, From elevated surface       General transfer comment: cues for LE management and use of UEs to self assist.  physical assist to bring wt up and fwd and to balance in initial standing.  Step pvt BSC to bedside with RW    Ambulation/Gait Ambulation/Gait assistance: Min assist, Mod assist, +2 safety/equipment Gait Distance (Feet): 38 Feet Assistive device: Rolling walker (2 wheels) Gait Pattern/deviations: Step-to pattern, Decreased step length - right, Decreased step length - left, Shuffle, Trunk flexed, Antalgic Gait velocity: decr     General Gait Details: cues for sequence, postgure and position from [employer] Mobility    Modified Rankin (Stroke Patients Only)       Balance Overall balance assessment: Needs assistance Sitting-balance support: No upper extremity supported, Feet supported Sitting balance-Leahy Scale: Fair     Standing balance support: Bilateral upper extremity supported Standing balance-Leahy Scale: Poor                              Cognition Arousal/Alertness: Awake/alert Behavior During Therapy: WFL for tasks assessed/performed Overall Cognitive Status: Within Functional Limits for tasks assessed  Exercises Total Joint Exercises Ankle Circles/Pumps: AROM, Both, 15 reps, Supine Quad Sets: AROM, Both, 10 reps, Supine Heel Slides: AAROM, Right, 10 reps, Supine Straight Leg Raises: AAROM, Right, 10 reps, Supine    General Comments        Pertinent Vitals/Pain Pain Assessment Pain Assessment:  0-10 Pain Score: 8  Pain Location: R knee Pain Descriptors / Indicators: Aching, Grimacing, Sore Pain Intervention(s): Limited activity within patient's tolerance, Monitored during session, Patient requesting pain meds-RN notified, Ice applied    Home Living Family/patient expects to be discharged to:: Skilled nursing facility Living Arrangements: Alone                      Prior Function            PT Goals (current goals can now be found in the care plan section) Acute Rehab PT Goals Patient Stated Goal: Rehab to regain IND prior to dc home alone PT Goal Formulation: With patient Time For Goal Achievement: 12/30/21 Potential to Achieve Goals: Good Progress towards PT goals: Progressing toward goals    Frequency    7X/week      PT Plan Current plan remains appropriate    Co-evaluation              AM-PAC PT "6 Clicks" Mobility   Outcome Measure  Help needed turning from your back to your side while in a flat bed without using bedrails?: A Lot Help needed moving from lying on your back to sitting on the side of a flat bed without using bedrails?: A Lot Help needed moving to and from a bed to a chair (including a wheelchair)?: A Lot Help needed standing up from a chair using your arms (e.g., wheelchair or bedside chair)?: A Lot Help needed to walk in hospital room?: A Lot Help needed climbing 3-5 steps with a railing? : Total 6 Click Score: 11    End of Session Equipment Utilized During Treatment: Gait belt;Right knee immobilizer Activity Tolerance: Patient tolerated treatment well Patient left: with call bell/phone within reach;in bed;with bed alarm set Nurse Communication: Mobility status PT Visit Diagnosis: Difficulty in walking, not elsewhere classified (R26.2)     Time: 7829-5621 PT Time Calculation (min) (ACUTE ONLY): 26 min  Charges:  $Gait Training: 8-22 mins $Therapeutic Exercise: 8-22 mins $Therapeutic Activity: 8-22 mins                      Mauro Kaufmann PT Acute Rehabilitation Services Pager [phone] Office [phone]    Omir Cooprider 12/17/2021, 3:13 PM

## 2021-12-18 NOTE — [place] FL2 (Signed)
[place] MEDICAID FL2 LEVEL OF CARE SCREENING TOOL     IDENTIFICATION  Patient Name: [name] [name] Birthdate: 1951-01-19 Sex: female Admission Date (Current Location): 12/16/2021  [facility] and [place] Number:  Geophysical data processor and Address:  [facility],  [address] [place], [place] [address]      Provider Number: [id number]  Attending Physician Name and Address:  Kathryne Hitch,*  Relative Name and Phone Number:  Rada Hay [phone]  [phone]  [facility] Sister [phone]  [phone]    Current Level of Care: Hospital Recommended Level of Care: Skilled Nursing Facility Prior Approval Number:    Date Approved/Denied:   PASRR Number: [id number]  Discharge Plan: SNF    Current Diagnoses: Patient Active Problem List   Diagnosis Date Noted   Status post total right knee replacement 12/16/2021   Unilateral primary osteoarthritis, right knee 10/12/2021   Chronic RLQ pain 04/29/2021   Periumbilical pain 04/29/2021   Spinal stenosis of lumbar region with neurogenic claudication    Spondylolisthesis, lumbar region    Other spondylosis with radiculopathy, lumbar region    Acute postoperative anemia due to expected blood loss 02/01/2021   Fusion of spine of lumbar region 01/31/2021   S/P lumbar fusion L4 to Sacrum 06/22/09 10/11/2020   Myalgia and myositis 06/12/2018   Other and unspecified nonspecific immunological findings 04/25/2018   Osteoarthrosis, hand 10/19/2017   Insomnia 09/23/2015   Plantar fascial fibromatosis 09/23/2015   Shortness of breath 04/28/2015   Abnormal myocardial perfusion study    Disturbance of skin sensation 08/05/2014   Chronic kidney disease, stage III (moderate) (HCC) 02/02/2014   Impaired fasting glucose 02/02/2014   Vitamin D deficiency 10/30/2013   Other osteoporosis 07/29/2012   Myocardial infarction (HCC) 12/29/2010   Cardiovascular disease 05/26/2009   KNEE PAIN, LEFT 07/02/2008    HIP PAIN 04/27/2008   OBESITY 02/24/2008   HYPERGLYCEMIA, FASTING 02/10/2008   GERD 03/12/2007   ANXIETY DEPRESSION 08/28/2006   DEGENERATIVE DISC DISEASE 08/28/2006   HYPERLIPIDEMIA 05/29/2006   CARPAL TUNNEL SYNDROME 05/29/2006   HYPERTENSION 05/29/2006   Unspecified constipation 05/29/2006   IBS 05/29/2006   ARTHRITIS 05/29/2006   LOW BACK PAIN 05/29/2006    Orientation RESPIRATION BLADDER Height & Weight     Self, Time, Situation, Place  Normal Continent Weight: 248 lb 7.3 oz (112.7 kg) Height:  5\' 7"  (170.2 cm)  BEHAVIORAL SYMPTOMS/MOOD NEUROLOGICAL BOWEL NUTRITION STATUS      Continent Diet (Regular diet)  AMBULATORY STATUS COMMUNICATION OF NEEDS Skin   Limited Assist Verbally Surgical wounds                       Personal Care Assistance Level of Assistance  Bathing, Feeding, Dressing Bathing Assistance: Limited assistance Feeding assistance: Independent Dressing Assistance: Limited assistance     Functional Limitations Info  Sight, Speech, Hearing Sight Info: Adequate Hearing Info: Adequate Speech Info: Adequate    SPECIAL CARE FACTORS FREQUENCY  PT (By licensed PT), OT (By licensed OT)     PT Frequency: Minimum 5x a week OT Frequency: Minimum 5x a week            Contractures Contractures Info: Not present    Additional Factors Info  Code Status, Allergies Code Status Info: Full Allergies Info: No Known Allergies           Current Medications (12/18/2021):  This is the current hospital active medication list Current Facility-Administered Medications  Medication Dose Route Frequency Provider Last  Rate Last Admin   0.9 %  sodium chloride infusion   Intravenous Continuous Kathryne Hitch, MD   Stopped at 12/17/21 0630   acetaminophen (TYLENOL) tablet 325-650 mg  325-650 mg Oral Q6H PRN Kathryne Hitch, MD       alum & mag hydroxide-simeth (MAALOX/MYLANTA) 200-200-20 MG/5ML suspension 30 mL  30 mL Oral Q4H PRN Kathryne Hitch, MD       amLODipine (NORVASC) tablet 10 mg  10 mg Oral Daily Kathryne Hitch, MD   10 mg at 12/18/21 0750   aspirin chewable tablet 81 mg  81 mg Oral BID Kathryne Hitch, MD   81 mg at 12/18/21 0749   atorvastatin (LIPITOR) tablet 40 mg  40 mg Oral Daily Kathryne Hitch, MD   40 mg at 12/18/21 0750   cholecalciferol (VITAMIN D3) tablet 2,000 Units  2,000 Units Oral Daily Kathryne Hitch, MD   2,000 Units at 12/18/21 0749   diphenhydrAMINE (BENADRYL) 12.5 MG/5ML elixir 12.5-25 mg  12.5-25 mg Oral Q4H PRN Kathryne Hitch, MD       docusate sodium (COLACE) capsule 100 mg  100 mg Oral BID Kathryne Hitch, MD   100 mg at 12/18/21 0745   gabapentin (NEURONTIN) capsule 300 mg  300 mg Oral TID Kathryne Hitch, MD   300 mg at 12/18/21 0745   hydrochlorothiazide (HYDRODIURIL) tablet 25 mg  25 mg Oral Daily Kathryne Hitch, MD   25 mg at 12/18/21 0748   HYDROmorphone (DILAUDID) injection 0.5-1 mg  0.5-1 mg Intravenous Q4H PRN Kathryne Hitch, MD       HYDROmorphone (DILAUDID) tablet 2-3 mg  2-3 mg Oral Q4H PRN Kathryne Hitch, MD   2 mg at 12/17/21 2025   lisinopril (ZESTRIL) tablet 40 mg  40 mg Oral Daily Kathryne Hitch, MD   40 mg at 12/18/21 0744   meclizine (ANTIVERT) tablet 12.5 mg  12.5 mg Oral TID PRN Kathryne Hitch, MD       menthol-cetylpyridinium (CEPACOL) lozenge 3 mg  1 lozenge Oral PRN Kathryne Hitch, MD       Or   phenol (CHLORASEPTIC) mouth spray 1 spray  1 spray Mouth/Throat PRN Kathryne Hitch, MD       metFORMIN (GLUCOPHAGE) tablet 500 mg  500 mg Oral Q breakfast Kathryne Hitch, MD   500 mg at 12/18/21 0748   methocarbamol (ROBAXIN) tablet 500 mg  500 mg Oral Q6H PRN Kathryne Hitch, MD   500 mg at 12/18/21 0600   Or   methocarbamol (ROBAXIN) 500 mg in dextrose 5 % 50 mL IVPB  500 mg Intravenous Q6H PRN Kathryne Hitch, MD        metoCLOPramide (REGLAN) tablet 5-10 mg  5-10 mg Oral Q8H PRN Kathryne Hitch, MD       Or   metoCLOPramide (REGLAN) injection 5-10 mg  5-10 mg Intravenous Q8H PRN Kathryne Hitch, MD       ondansetron [facility]) tablet 4 mg  4 mg Oral Q6H PRN Kathryne Hitch, MD       Or   ondansetron [facility]) injection 4 mg  4 mg Intravenous Q6H PRN Kathryne Hitch, MD       oxyCODONE (Oxy IR/ROXICODONE) immediate release tablet 5-10 mg  5-10 mg Oral Q4H PRN Kathryne Hitch, MD   10 mg at 12/18/21 0600   pantoprazole (PROTONIX) EC tablet 40 mg  40 mg Oral  Daily Kathryne Hitch, MD   40 mg at 12/18/21 0744   polyethylene glycol (MIRALAX / GLYCOLAX) packet 17 g  17 g Oral Daily PRN Kathryne Hitch, MD         Discharge Medications: Please see discharge summary for a list of discharge medications.  Relevant Imaging Results:  Relevant Lab Results:   Additional Information SSN [id number]  Darleene Cleaver, LCSW

## 2021-12-18 NOTE — Progress Notes (Signed)
Physical Therapy Treatment Patient Details Name: [name] [name] MRN: [id number] DOB: [birth year] Today's Date: 12/18/2021   History of Present Illness Pt s/p R TKR and with hx of CKD, CAD, DDD< MI, DM, Lumbar fusion, and Vertigo    PT Comments    Therex program performed with pt participating intermittently 2* lethargy - ?med related.  Pt not alert enough to safely attempt OOB activity - RN aware.  Will follow up in pm.   Recommendations for follow up therapy are one component of a multi-disciplinary discharge planning process, led by the attending physician.  Recommendations may be updated based on patient status, additional functional criteria and insurance authorization.  Follow Up Recommendations  Skilled nursing-short term rehab (<3 hours/day) Can patient physically be transported by private vehicle: No   Assistance Recommended at Discharge Frequent or constant Supervision/Assistance  Patient can return home with the following A lot of help with walking and/or transfers;A little help with bathing/dressing/bathroom;Assistance with cooking/housework;Assist for transportation;Help with stairs or ramp for entrance   Equipment Recommendations  None recommended by PT    Recommendations for Other Services       Precautions / Restrictions Precautions Precautions: Knee;Fall Required Braces or Orthoses: Knee Immobilizer - Right Knee Immobilizer - Right: Discontinue once straight leg raise with < 10 degree lag Restrictions Weight Bearing Restrictions: No Other Position/Activity Restrictions: WBAT     Mobility  Bed Mobility               General bed mobility comments: OOB deferred 2* pt lethargy    Transfers                        Ambulation/Gait                   Stairs             Wheelchair Mobility    Modified Rankin (Stroke Patients Only)       Balance                                            Cognition  Arousal/Alertness: Lethargic, Suspect due to medications Behavior During Therapy: Flat affect Overall Cognitive Status: Impaired/Different from baseline Area of Impairment: Following commands, Awareness                       Following Commands: Follows one step commands inconsistently, Follows one step commands with increased time       General Comments: Pt lethargic - ?meds and following cues intermittently        Exercises Total Joint Exercises Ankle Circles/Pumps: Both, 15 reps, Supine, AAROM Quad Sets: Both, 10 reps, Supine, AAROM Heel Slides: AAROM, Right, Supine, 20 reps Straight Leg Raises: AAROM, Right, Supine, 15 reps    General Comments        Pertinent Vitals/Pain Pain Assessment Pain Assessment: Faces Faces Pain Scale: Hurts little more Pain Location: R knee with movement Pain Descriptors / Indicators: Grimacing Pain Intervention(s): Limited activity within patient's tolerance, Monitored during session, Premedicated before session, Ice applied    Home Living                          Prior Function            PT Goals (current goals can now  be found in the care plan section) Acute Rehab PT Goals Patient Stated Goal: Rehab to regain IND prior to dc home alone PT Goal Formulation: With patient Time For Goal Achievement: 12/30/21 Potential to Achieve Goals: Good Progress towards PT goals: Not progressing toward goals - comment (increased lethargy ?meds)    Frequency    7X/week      PT Plan Current plan remains appropriate    Co-evaluation              AM-PAC PT "6 Clicks" Mobility   Outcome Measure  Help needed turning from your back to your side while in a flat bed without using bedrails?: A Lot Help needed moving from lying on your back to sitting on the side of a flat bed without using bedrails?: A Lot Help needed moving to and from a bed to a chair (including a wheelchair)?: A Lot Help needed standing up from a  chair using your arms (e.g., wheelchair or bedside chair)?: A Lot Help needed to walk in hospital room?: Total Help needed climbing 3-5 steps with a railing? : Total 6 Click Score: 10    End of Session Equipment Utilized During Treatment: Gait belt;Right knee immobilizer Activity Tolerance: Patient tolerated treatment well Patient left: with call bell/phone within reach;in bed;with bed alarm set Nurse Communication: Mobility status PT Visit Diagnosis: Difficulty in walking, not elsewhere classified (R26.2)     Time: 1610-9604 PT Time Calculation (min) (ACUTE ONLY): 20 min  Charges:  $Therapeutic Exercise: 8-22 mins                     Mauro Kaufmann PT Acute Rehabilitation Services Pager [phone] Office [phone]    Delynda Sepulveda 12/18/2021, 12:55 PM

## 2021-12-19 ENCOUNTER — Encounter (HOSPITAL_COMMUNITY): Payer: Self-pay | Admitting: Orthopaedic Surgery

## 2021-12-19 MED ORDER — METHOCARBAMOL 500 MG PO TABS: 500.0000 mg | ORAL_TABLET | Freq: Four times a day (QID) | ORAL | 0 refills | Status: DC | PRN

## 2021-12-19 MED ORDER — ASPIRIN 81 MG PO CHEW: 81.0000 mg | CHEWABLE_TABLET | Freq: Two times a day (BID) | ORAL | 0 refills | Status: DC

## 2021-12-19 MED ORDER — OXYCODONE HCL 5 MG PO TABS: 5.0000 mg | ORAL_TABLET | ORAL | 0 refills | Status: DC | PRN

## 2021-12-19 NOTE — TOC Progression Note (Addendum)
Transition of Care [facility]) - Progression Note   Patient Details  Name: [name] [name] MRN: [id number] Date of Birth: [birth year]  Transition of [facility]) CM/SW Contact  Ewing Schlein, LCSW Phone Number: 12/19/2021, 12:42 PM  Clinical Narrative: CSW spoke with Shawna Orleans in admissions at [facility] as the facility is the patient's first choice for rehab. [facility] able to offer bed, but will need to start insurance authorization as patient is not managed by [facility] until July 2. Facility to start insurance authorization today. CSW notified patient of bed offer and that discharge to the facility is pending insurance approval. Son, Samul Dada, also aware of bed acceptance. TOC awaiting insurance approval.  Expected Discharge Plan: Skilled Nursing Facility Barriers to Discharge: Insurance Authorization  Expected Discharge Plan and Services Expected Discharge Plan: Skilled Nursing Facility In-house Referral: Clinical Social Work Post Acute Care Choice: Skilled Nursing Facility Living arrangements for the past 2 months: Single Family Home Expected Discharge Date: 12/19/21               DME Arranged: N/A DME Agency: NA HH Arranged: PT HH Agency: [facility] (referral made to HHPT prior to surgery, but patient does note that she will most likely go to SNF for her STR)  Readmission Risk Interventions     No data to display

## 2021-12-20 DIAGNOSIS — I10 Essential (primary) hypertension: Secondary | ICD-10-CM | POA: Diagnosis not present

## 2021-12-20 DIAGNOSIS — E1165 Type 2 diabetes mellitus with hyperglycemia: Secondary | ICD-10-CM | POA: Diagnosis not present

## 2021-12-20 DIAGNOSIS — Z471 Aftercare following joint replacement surgery: Secondary | ICD-10-CM | POA: Diagnosis not present

## 2021-12-20 DIAGNOSIS — Z743 Need for continuous supervision: Secondary | ICD-10-CM | POA: Diagnosis not present

## 2021-12-20 DIAGNOSIS — N183 Chronic kidney disease, stage 3 unspecified: Secondary | ICD-10-CM | POA: Diagnosis not present

## 2021-12-20 DIAGNOSIS — R531 Weakness: Secondary | ICD-10-CM | POA: Diagnosis not present

## 2021-12-20 DIAGNOSIS — Z96651 Presence of right artificial knee joint: Secondary | ICD-10-CM | POA: Diagnosis not present

## 2021-12-20 DIAGNOSIS — I2510 Atherosclerotic heart disease of native coronary artery without angina pectoris: Secondary | ICD-10-CM | POA: Diagnosis not present

## 2021-12-20 DIAGNOSIS — R41841 Cognitive communication deficit: Secondary | ICD-10-CM | POA: Diagnosis not present

## 2021-12-20 DIAGNOSIS — M1711 Unilateral primary osteoarthritis, right knee: Secondary | ICD-10-CM | POA: Diagnosis not present

## 2021-12-20 DIAGNOSIS — M6281 Muscle weakness (generalized): Secondary | ICD-10-CM | POA: Diagnosis not present

## 2021-12-20 DIAGNOSIS — E1140 Type 2 diabetes mellitus with diabetic neuropathy, unspecified: Secondary | ICD-10-CM | POA: Diagnosis not present

## 2021-12-20 DIAGNOSIS — R2689 Other abnormalities of gait and mobility: Secondary | ICD-10-CM | POA: Diagnosis not present

## 2021-12-20 DIAGNOSIS — M4326 Fusion of spine, lumbar region: Secondary | ICD-10-CM | POA: Diagnosis not present

## 2021-12-20 LAB — SARS CORONAVIRUS 2 BY RT PCR: SARS Coronavirus 2 by RT PCR: NEGATIVE

## 2021-12-20 NOTE — Progress Notes (Signed)
Patient ID: [name] [name], female   DOB: [birth year], 71 y.o.   MRN: [id number] Vitals stable and right operative knee stable.  Awaiting insurance approval for short-term SNF.  Can be discharged today.

## 2021-12-20 NOTE — Plan of Care (Signed)
  Problem: Pain Management: Goal: Pain level will decrease with appropriate interventions Outcome: Progressing   

## 2021-12-21 ENCOUNTER — Telehealth: Payer: Self-pay

## 2021-12-21 NOTE — Telephone Encounter (Signed)
Called and informed.

## 2021-12-21 NOTE — Telephone Encounter (Signed)
Brandy with [facility] called and want to know if pt needs a cpm machine. If so what degree is it suppose to be set to. Please advise  Cb# [phone]

## 2021-12-22 ENCOUNTER — Telehealth: Payer: Self-pay | Admitting: *Deleted

## 2021-12-22 DIAGNOSIS — I10 Essential (primary) hypertension: Secondary | ICD-10-CM | POA: Diagnosis not present

## 2021-12-22 DIAGNOSIS — M1711 Unilateral primary osteoarthritis, right knee: Secondary | ICD-10-CM | POA: Diagnosis not present

## 2021-12-22 DIAGNOSIS — E1165 Type 2 diabetes mellitus with hyperglycemia: Secondary | ICD-10-CM | POA: Diagnosis not present

## 2021-12-22 NOTE — Telephone Encounter (Signed)
Attempted D/C call to patient; no answer and left VM requesting call back.

## 2021-12-29 ENCOUNTER — Encounter: Payer: Self-pay | Admitting: Orthopaedic Surgery

## 2021-12-29 ENCOUNTER — Ambulatory Visit (INDEPENDENT_AMBULATORY_CARE_PROVIDER_SITE_OTHER): Payer: Medicare HMO | Admitting: Orthopaedic Surgery

## 2021-12-29 DIAGNOSIS — Z96651 Presence of right artificial knee joint: Secondary | ICD-10-CM

## 2021-12-29 NOTE — Progress Notes (Signed)
The patient is a 71 year old female who is here for first postoperative visit status post a right total knee arthroplasty.  She has been recovering at a skilled nursing facility.  Her son is with her today.  On examination she has been wearing a knee immobilizer but they have removed it to work on range of motion of her knee.  Her staple line looks good and the staples have been removed and Steri-Strips applied.  There is surprisingly not a lot of swelling with her knee.  Her extension is full and I can flex her to almost 90 degrees.  Her calf is soft.  She has no evidence of foot drop.  It is essential that we stop her knee immobilizer and get her into more aggressive therapy as she transitions out of skilled nursing.  I talked to her son about this.  I will see her back in 4 weeks to see how she is doing overall.

## 2022-01-06 DIAGNOSIS — I10 Essential (primary) hypertension: Secondary | ICD-10-CM | POA: Diagnosis not present

## 2022-01-06 DIAGNOSIS — E1165 Type 2 diabetes mellitus with hyperglycemia: Secondary | ICD-10-CM | POA: Diagnosis not present

## 2022-01-06 DIAGNOSIS — M1711 Unilateral primary osteoarthritis, right knee: Secondary | ICD-10-CM | POA: Diagnosis not present

## 2022-01-12 ENCOUNTER — Ambulatory Visit: Payer: Medicare HMO | Admitting: Specialist

## 2022-01-12 ENCOUNTER — Encounter: Payer: Self-pay | Admitting: Specialist

## 2022-01-12 ENCOUNTER — Ambulatory Visit: Payer: Self-pay

## 2022-01-12 VITALS — BP 103/68 | HR 70 | Ht 67.0 in | Wt 232.0 lb

## 2022-01-12 DIAGNOSIS — Z96651 Presence of right artificial knee joint: Secondary | ICD-10-CM

## 2022-01-12 DIAGNOSIS — M4326 Fusion of spine, lumbar region: Secondary | ICD-10-CM | POA: Diagnosis not present

## 2022-01-12 NOTE — Patient Instructions (Addendum)
Avoid frequent bending and stooping  No lifting greater than 10-15 lbs. May use ice or moist heat for pain. Weight loss is of benefit. Best medication for lumbar disc disease is arthritis medications like motrin, celebrex and naprosyn. Exercise is important to improve your indurance and does allow people to function better inspite of back pain.   

## 2022-01-12 NOTE — Progress Notes (Signed)
Office Visit Note   Patient: [name] [name]           Date of Birth: 03-19-1951           MRN: [id number] Visit Date: 01/12/2022              Requested by: Juliette Alcide, MD [address] [place],  [place] [address] PCP: Juliette Alcide, MD   Assessment & Plan: Nearly one year post op TLIF L3-4 Visit Diagnoses:  1. Fusion of lumbar spine   Right TKR done one month ago, has minimal extension lag with flexion to 80 degrees.  Plan: Avoid frequent bending and stooping  No lifting greater than 10 lbs. May use ice or moist heat for pain. Weight loss is of benefit. Best medication for lumbar disc disease is arthritis medications like motrin, celebrex and naprosyn. Exercise is important to improve your indurance and does allow people to function better inspite of back pain.  Follow-Up Instructions: No follow-ups on file.   Orders:  Orders Placed This Encounter  Procedures  . XR Lumbar Spine 2-3 Views   No orders of the defined types were placed in this encounter.     Procedures: No procedures performed   Clinical Data: No additional findings.   Subjective: Chief Complaint  Patient presents with  . Lower Back - Follow-up    HPI  Review of Systems  Constitutional: Negative.   HENT: Negative.    Eyes: Negative.   Respiratory: Negative.    Cardiovascular: Negative.   Gastrointestinal: Negative.   Endocrine: Negative.   Genitourinary: Negative.   Musculoskeletal: Negative.   Skin: Negative.   Allergic/Immunologic: Negative.   Neurological: Negative.   Hematological: Negative.   Psychiatric/Behavioral: Negative.       Objective: Vital Signs: BP 103/68 (BP Location: Left Arm, Patient Position: Sitting)   Pulse 70   Ht 5\' 7"  (1.702 m)   Wt 232 lb (105.2 kg)   BMI 36.34 kg/m   Physical Exam Constitutional:      Appearance: She is well-developed.  HENT:     Head: Normocephalic and atraumatic.  Eyes:     Pupils: Pupils are equal, round, and reactive to  light.  Pulmonary:     Effort: Pulmonary effort is normal.     Breath sounds: Normal breath sounds.  Abdominal:     General: Bowel sounds are normal.     Palpations: Abdomen is soft.  Musculoskeletal:     Cervical back: Normal range of motion and neck supple.     Lumbar back: Negative right straight leg raise test and negative left straight leg raise test.  Skin:    General: Skin is warm and dry.  Neurological:     Mental Status: She is alert and oriented to person, place, and time.  Psychiatric:        Behavior: Behavior normal.        Thought Content: Thought content normal.        Judgment: Judgment normal.   Back Exam   Tenderness  The patient is experiencing tenderness in the lumbar.  Range of Motion  Extension:  abnormal  Lateral bend right:  abnormal  Lateral bend left:  abnormal  Rotation right:  abnormal  Rotation left:  abnormal   Muscle Strength  Left Quadriceps:  5/5  Right Hamstrings:  5/5  Left Hamstrings:  5/5   Tests  Straight leg raise right: negative Straight leg raise left: negative    Specialty Comments:  No specialty comments available.  Imaging: No results found.   PMFS History: Patient Active Problem List   Diagnosis Date Noted  . Acute postoperative anemia due to expected blood loss 02/01/2021    Priority: Medium     Class: Acute  . Status post total right knee replacement 12/16/2021  . Unilateral primary osteoarthritis, right knee 10/12/2021  . Chronic RLQ pain 04/29/2021  . Periumbilical pain 04/29/2021  . Spinal stenosis of lumbar region with neurogenic claudication   . Spondylolisthesis, lumbar region   . Other spondylosis with radiculopathy, lumbar region   . Fusion of spine of lumbar region 01/31/2021  . S/P lumbar fusion L4 to Sacrum 06/22/09 10/11/2020  . Myalgia and myositis 06/12/2018  . Other and unspecified nonspecific immunological findings 04/25/2018  . Osteoarthrosis, hand 10/19/2017  . Insomnia 09/23/2015  .  Plantar fascial fibromatosis 09/23/2015  . Shortness of breath 04/28/2015  . Abnormal myocardial perfusion study   . Disturbance of skin sensation 08/05/2014  . Chronic kidney disease, stage III (moderate) (HCC) 02/02/2014  . Impaired fasting glucose 02/02/2014  . Vitamin D deficiency 10/30/2013  . Other osteoporosis 07/29/2012  . Myocardial infarction (HCC) 12/29/2010  . Cardiovascular disease 05/26/2009  . KNEE PAIN, LEFT 07/02/2008  . HIP PAIN 04/27/2008  . OBESITY 02/24/2008  . HYPERGLYCEMIA, FASTING 02/10/2008  . GERD 03/12/2007  . ANXIETY DEPRESSION 08/28/2006  . DEGENERATIVE DISC DISEASE 08/28/2006  . HYPERLIPIDEMIA 05/29/2006  . CARPAL TUNNEL SYNDROME 05/29/2006  . HYPERTENSION 05/29/2006  . Unspecified constipation 05/29/2006  . IBS 05/29/2006  . ARTHRITIS 05/29/2006  . LOW BACK PAIN 05/29/2006   Past Medical History:  Diagnosis Date  . Arthritis   . Carpal tunnel syndrome   . Chronic kidney disease (CKD), stage II (mild)   . Constipation   . Coronary artery disease    NSTEMI 2004, DES to ostial RCA  . DDD (degenerative disc disease)   . Essential hypertension   . GERD (gastroesophageal reflux disease)   . Hyperlipidemia   . IBS (irritable bowel syndrome)   . Low back pain   . Myocardial infarction (HCC) 2004  . Postmenopausal   . Pulmonary nodule   . Sleep apnea    "waiting on machine to come".  . Type 2 diabetes mellitus (HCC)   . Vertigo     Family History  Problem Relation Age of Onset  . Stroke Mother   . Diabetes Mellitus II Mother   . Kidney disease Father   . Hypertension Brother   . Diabetes Mellitus II Brother   . Diabetes Mellitus II Sister     Past Surgical History:  Procedure Laterality Date  . ABDOMINAL HYSTERECTOMY     partial  . CARDIAC CATHETERIZATION N/A 04/28/2015   Procedure: Left Heart Cath and Coronary Angiography;  Surgeon: Tonny Bollman, MD;  [facility] INVASIVE CV LAB;  Service: Cardiovascular;  Laterality: N/A;  .  CATARACT EXTRACTION W/PHACO Right 07/19/2020   Procedure: CATARACT EXTRACTION PHACO AND INTRAOCULAR LENS PLACEMENT RIGHT EYE;  Surgeon: Fabio Pierce, MD;  Location: [facility] ORS;  Service: Ophthalmology;  Laterality: Right;  right CDE=12.56  . CATARACT EXTRACTION W/PHACO Left 08/09/2020   Procedure: CATARACT EXTRACTION PHACO AND INTRAOCULAR LENS PLACEMENT (IOC);  Surgeon: Fabio Pierce, MD;  Location: [facility] ORS;  Service: Ophthalmology;  Laterality: Left;  CDE: 6.66  . Cervical disectomy and fusion    . Cholestectomy    . COLONOSCOPY N/A 05/07/2019   Procedure: COLONOSCOPY;  Surgeon: Corbin Ade, MD;  Location: [facility] ENDO  SUITE;  Service: Endoscopy;  Laterality: N/A;  12:00  . Excision of ovarian cyst    . L4-5 with pedicle screws and rods     x2  . MULTIPLE TOOTH EXTRACTIONS    . Right shoulder RTC    . TOTAL KNEE ARTHROPLASTY Right 12/16/2021   Procedure: RIGHT TOTAL KNEE ARTHROPLASTY;  Surgeon: Mcarthur Rossetti, MD;  Location: [facility] ORS;  Service: Orthopedics;  Laterality: Right;   Social History   Occupational History  . Occupation: Oncologist, Clinical cytogeneticist  Tobacco Use  . Smoking status: Never  . Smokeless tobacco: Never  Vaping Use  . Vaping Use: Never used  Substance and Sexual Activity  . Alcohol use: No    Alcohol/week: 0.0 standard drinks of alcohol  . Drug use: No  . Sexual activity: Never

## 2022-01-17 ENCOUNTER — Telehealth: Payer: Self-pay | Admitting: *Deleted

## 2022-01-17 NOTE — Telephone Encounter (Signed)
Ortho bundle 30 day call from SW with [facility] in [place]. Patient to be discharged today. Confirmed that OPPT should be scheduled. She is sending referral to [facility] (Patient's choice) to begin her OPPT rehab. Will continue to follow for needs. Will continue to follow patient.

## 2022-01-24 DIAGNOSIS — M6281 Muscle weakness (generalized): Secondary | ICD-10-CM | POA: Diagnosis not present

## 2022-01-24 DIAGNOSIS — Z471 Aftercare following joint replacement surgery: Secondary | ICD-10-CM | POA: Diagnosis not present

## 2022-01-24 DIAGNOSIS — R2689 Other abnormalities of gait and mobility: Secondary | ICD-10-CM | POA: Diagnosis not present

## 2022-01-24 DIAGNOSIS — Z96651 Presence of right artificial knee joint: Secondary | ICD-10-CM | POA: Diagnosis not present

## 2022-01-26 ENCOUNTER — Telehealth: Payer: Self-pay | Admitting: *Deleted

## 2022-01-26 ENCOUNTER — Ambulatory Visit (INDEPENDENT_AMBULATORY_CARE_PROVIDER_SITE_OTHER): Payer: Medicare HMO | Admitting: Orthopaedic Surgery

## 2022-01-26 ENCOUNTER — Encounter: Payer: Self-pay | Admitting: Orthopaedic Surgery

## 2022-01-26 DIAGNOSIS — Z96651 Presence of right artificial knee joint: Secondary | ICD-10-CM

## 2022-01-26 NOTE — Telephone Encounter (Signed)
Ortho bundle in person meeting completed. 

## 2022-01-26 NOTE — Progress Notes (Signed)
Weeks status post a right total knee arthroplasty.  She is 71 years old.  She has started outpatient physical therapy recently.  She states that they were able to flex her just past 100 degrees.  On my exam today she lacks full extension by maybe 3 degrees and I can flex her to about 100 degrees.  It feels stable overall.  She will continue outpatient physical therapy.  I would like to see her back in 4 weeks with a repeat physical exam.  I do think it is worth obtaining a standing AP and lateral of her right operative knee at that visit.

## 2022-01-30 DIAGNOSIS — R2689 Other abnormalities of gait and mobility: Secondary | ICD-10-CM | POA: Diagnosis not present

## 2022-01-30 DIAGNOSIS — Z471 Aftercare following joint replacement surgery: Secondary | ICD-10-CM | POA: Diagnosis not present

## 2022-01-30 DIAGNOSIS — Z96651 Presence of right artificial knee joint: Secondary | ICD-10-CM | POA: Diagnosis not present

## 2022-01-30 DIAGNOSIS — M6281 Muscle weakness (generalized): Secondary | ICD-10-CM | POA: Diagnosis not present

## 2022-02-01 DIAGNOSIS — N390 Urinary tract infection, site not specified: Secondary | ICD-10-CM | POA: Diagnosis not present

## 2022-02-01 DIAGNOSIS — Z471 Aftercare following joint replacement surgery: Secondary | ICD-10-CM | POA: Diagnosis not present

## 2022-02-01 DIAGNOSIS — M6281 Muscle weakness (generalized): Secondary | ICD-10-CM | POA: Diagnosis not present

## 2022-02-01 DIAGNOSIS — Z6832 Body mass index (BMI) 32.0-32.9, adult: Secondary | ICD-10-CM | POA: Diagnosis not present

## 2022-02-01 DIAGNOSIS — Z96651 Presence of right artificial knee joint: Secondary | ICD-10-CM | POA: Diagnosis not present

## 2022-02-01 DIAGNOSIS — R030 Elevated blood-pressure reading, without diagnosis of hypertension: Secondary | ICD-10-CM | POA: Diagnosis not present

## 2022-02-01 DIAGNOSIS — R2689 Other abnormalities of gait and mobility: Secondary | ICD-10-CM | POA: Diagnosis not present

## 2022-02-06 DIAGNOSIS — Z96651 Presence of right artificial knee joint: Secondary | ICD-10-CM | POA: Diagnosis not present

## 2022-02-06 DIAGNOSIS — M6281 Muscle weakness (generalized): Secondary | ICD-10-CM | POA: Diagnosis not present

## 2022-02-06 DIAGNOSIS — Z471 Aftercare following joint replacement surgery: Secondary | ICD-10-CM | POA: Diagnosis not present

## 2022-02-06 DIAGNOSIS — R2689 Other abnormalities of gait and mobility: Secondary | ICD-10-CM | POA: Diagnosis not present

## 2022-02-09 DIAGNOSIS — Z96651 Presence of right artificial knee joint: Secondary | ICD-10-CM | POA: Diagnosis not present

## 2022-02-09 DIAGNOSIS — R2689 Other abnormalities of gait and mobility: Secondary | ICD-10-CM | POA: Diagnosis not present

## 2022-02-09 DIAGNOSIS — Z471 Aftercare following joint replacement surgery: Secondary | ICD-10-CM | POA: Diagnosis not present

## 2022-02-09 DIAGNOSIS — M6281 Muscle weakness (generalized): Secondary | ICD-10-CM | POA: Diagnosis not present

## 2022-02-13 DIAGNOSIS — R2689 Other abnormalities of gait and mobility: Secondary | ICD-10-CM | POA: Diagnosis not present

## 2022-02-13 DIAGNOSIS — Z96651 Presence of right artificial knee joint: Secondary | ICD-10-CM | POA: Diagnosis not present

## 2022-02-13 DIAGNOSIS — M6281 Muscle weakness (generalized): Secondary | ICD-10-CM | POA: Diagnosis not present

## 2022-02-13 DIAGNOSIS — Z471 Aftercare following joint replacement surgery: Secondary | ICD-10-CM | POA: Diagnosis not present

## 2022-02-15 DIAGNOSIS — R2689 Other abnormalities of gait and mobility: Secondary | ICD-10-CM | POA: Diagnosis not present

## 2022-02-15 DIAGNOSIS — Z96651 Presence of right artificial knee joint: Secondary | ICD-10-CM | POA: Diagnosis not present

## 2022-02-15 DIAGNOSIS — Z471 Aftercare following joint replacement surgery: Secondary | ICD-10-CM | POA: Diagnosis not present

## 2022-02-15 DIAGNOSIS — M6281 Muscle weakness (generalized): Secondary | ICD-10-CM | POA: Diagnosis not present

## 2022-02-16 DIAGNOSIS — R7301 Impaired fasting glucose: Secondary | ICD-10-CM | POA: Diagnosis not present

## 2022-02-16 DIAGNOSIS — M1711 Unilateral primary osteoarthritis, right knee: Secondary | ICD-10-CM | POA: Diagnosis not present

## 2022-02-16 DIAGNOSIS — N183 Chronic kidney disease, stage 3 unspecified: Secondary | ICD-10-CM | POA: Diagnosis not present

## 2022-02-16 DIAGNOSIS — Z6834 Body mass index (BMI) 34.0-34.9, adult: Secondary | ICD-10-CM | POA: Diagnosis not present

## 2022-02-16 DIAGNOSIS — E782 Mixed hyperlipidemia: Secondary | ICD-10-CM | POA: Diagnosis not present

## 2022-02-16 DIAGNOSIS — I10 Essential (primary) hypertension: Secondary | ICD-10-CM | POA: Diagnosis not present

## 2022-02-16 DIAGNOSIS — Z96651 Presence of right artificial knee joint: Secondary | ICD-10-CM | POA: Diagnosis not present

## 2022-02-20 DIAGNOSIS — R2689 Other abnormalities of gait and mobility: Secondary | ICD-10-CM | POA: Diagnosis not present

## 2022-02-20 DIAGNOSIS — Z96651 Presence of right artificial knee joint: Secondary | ICD-10-CM | POA: Diagnosis not present

## 2022-02-20 DIAGNOSIS — M6281 Muscle weakness (generalized): Secondary | ICD-10-CM | POA: Diagnosis not present

## 2022-02-20 DIAGNOSIS — Z471 Aftercare following joint replacement surgery: Secondary | ICD-10-CM | POA: Diagnosis not present

## 2022-02-22 DIAGNOSIS — M6281 Muscle weakness (generalized): Secondary | ICD-10-CM | POA: Diagnosis not present

## 2022-02-22 DIAGNOSIS — Z96651 Presence of right artificial knee joint: Secondary | ICD-10-CM | POA: Diagnosis not present

## 2022-02-22 DIAGNOSIS — R2689 Other abnormalities of gait and mobility: Secondary | ICD-10-CM | POA: Diagnosis not present

## 2022-02-22 DIAGNOSIS — Z471 Aftercare following joint replacement surgery: Secondary | ICD-10-CM | POA: Diagnosis not present

## 2022-02-23 ENCOUNTER — Encounter: Payer: Self-pay | Admitting: Orthopaedic Surgery

## 2022-02-23 ENCOUNTER — Ambulatory Visit (INDEPENDENT_AMBULATORY_CARE_PROVIDER_SITE_OTHER): Payer: Medicare HMO | Admitting: Orthopaedic Surgery

## 2022-02-23 DIAGNOSIS — Z96651 Presence of right artificial knee joint: Secondary | ICD-10-CM

## 2022-02-23 DIAGNOSIS — R6889 Other general symptoms and signs: Secondary | ICD-10-CM | POA: Diagnosis not present

## 2022-02-23 NOTE — Progress Notes (Signed)
The patient is now 10 weeks status post a right total knee arthroplasty.  She states that she is doing well with therapy wants to keep working with her for a few more visits and I agree with that.  On my exam she lacks full extension by just a few degrees.  The knee is swollen to be expected and I can flex her to past 90 degrees.  There is swelling in the foot and ankle.  Her calf is soft.  She needs to continue to aggressively get her knee bending.  She is making good progress.  I would like to see her back in 3 months for her next visit with an AP and lateral of her right knee at that visit.  All questions and concerns were answered and addressed.

## 2022-03-01 DIAGNOSIS — M6281 Muscle weakness (generalized): Secondary | ICD-10-CM | POA: Diagnosis not present

## 2022-03-01 DIAGNOSIS — Z471 Aftercare following joint replacement surgery: Secondary | ICD-10-CM | POA: Diagnosis not present

## 2022-03-01 DIAGNOSIS — Z96651 Presence of right artificial knee joint: Secondary | ICD-10-CM | POA: Diagnosis not present

## 2022-03-01 DIAGNOSIS — R2689 Other abnormalities of gait and mobility: Secondary | ICD-10-CM | POA: Diagnosis not present

## 2022-03-08 DIAGNOSIS — Z96651 Presence of right artificial knee joint: Secondary | ICD-10-CM | POA: Diagnosis not present

## 2022-03-08 DIAGNOSIS — Z471 Aftercare following joint replacement surgery: Secondary | ICD-10-CM | POA: Diagnosis not present

## 2022-03-08 DIAGNOSIS — R2689 Other abnormalities of gait and mobility: Secondary | ICD-10-CM | POA: Diagnosis not present

## 2022-03-08 DIAGNOSIS — M6281 Muscle weakness (generalized): Secondary | ICD-10-CM | POA: Diagnosis not present

## 2022-03-15 DIAGNOSIS — R2689 Other abnormalities of gait and mobility: Secondary | ICD-10-CM | POA: Diagnosis not present

## 2022-03-15 DIAGNOSIS — R7301 Impaired fasting glucose: Secondary | ICD-10-CM | POA: Diagnosis not present

## 2022-03-15 DIAGNOSIS — N183 Chronic kidney disease, stage 3 unspecified: Secondary | ICD-10-CM | POA: Diagnosis not present

## 2022-03-15 DIAGNOSIS — Z6835 Body mass index (BMI) 35.0-35.9, adult: Secondary | ICD-10-CM | POA: Diagnosis not present

## 2022-03-15 DIAGNOSIS — M6281 Muscle weakness (generalized): Secondary | ICD-10-CM | POA: Diagnosis not present

## 2022-03-15 DIAGNOSIS — E7849 Other hyperlipidemia: Secondary | ICD-10-CM | POA: Diagnosis not present

## 2022-03-15 DIAGNOSIS — Z471 Aftercare following joint replacement surgery: Secondary | ICD-10-CM | POA: Diagnosis not present

## 2022-03-15 DIAGNOSIS — Z96651 Presence of right artificial knee joint: Secondary | ICD-10-CM | POA: Diagnosis not present

## 2022-03-15 DIAGNOSIS — R600 Localized edema: Secondary | ICD-10-CM | POA: Diagnosis not present

## 2022-03-20 DIAGNOSIS — R600 Localized edema: Secondary | ICD-10-CM | POA: Diagnosis not present

## 2022-03-20 DIAGNOSIS — I10 Essential (primary) hypertension: Secondary | ICD-10-CM | POA: Diagnosis not present

## 2022-03-20 DIAGNOSIS — R7303 Prediabetes: Secondary | ICD-10-CM | POA: Diagnosis not present

## 2022-03-21 MED ORDER — BUPIVACAINE HCL 0.5 % IJ SOLN
5.0000 mL | INTRAMUSCULAR | Status: AC | PRN
  Administered 2021-05-05: 5 mL via INTRA_ARTICULAR

## 2022-03-21 MED ORDER — METHYLPREDNISOLONE ACETATE 40 MG/ML IJ SUSP
40.0000 mg | INTRAMUSCULAR | Status: AC | PRN
  Administered 2021-05-05: 40 mg via INTRA_ARTICULAR

## 2022-03-22 DIAGNOSIS — R5382 Chronic fatigue, unspecified: Secondary | ICD-10-CM | POA: Diagnosis not present

## 2022-03-22 DIAGNOSIS — N183 Chronic kidney disease, stage 3 unspecified: Secondary | ICD-10-CM | POA: Diagnosis not present

## 2022-03-22 DIAGNOSIS — Z1329 Encounter for screening for other suspected endocrine disorder: Secondary | ICD-10-CM | POA: Diagnosis not present

## 2022-03-22 DIAGNOSIS — R7301 Impaired fasting glucose: Secondary | ICD-10-CM | POA: Diagnosis not present

## 2022-03-22 DIAGNOSIS — I10 Essential (primary) hypertension: Secondary | ICD-10-CM | POA: Diagnosis not present

## 2022-03-22 DIAGNOSIS — Z0001 Encounter for general adult medical examination with abnormal findings: Secondary | ICD-10-CM | POA: Diagnosis not present

## 2022-03-22 DIAGNOSIS — E7849 Other hyperlipidemia: Secondary | ICD-10-CM | POA: Diagnosis not present

## 2022-03-22 DIAGNOSIS — E559 Vitamin D deficiency, unspecified: Secondary | ICD-10-CM | POA: Diagnosis not present

## 2022-03-27 DIAGNOSIS — E7849 Other hyperlipidemia: Secondary | ICD-10-CM | POA: Diagnosis not present

## 2022-03-27 DIAGNOSIS — Z0001 Encounter for general adult medical examination with abnormal findings: Secondary | ICD-10-CM | POA: Diagnosis not present

## 2022-03-27 DIAGNOSIS — Z981 Arthrodesis status: Secondary | ICD-10-CM | POA: Diagnosis not present

## 2022-03-27 DIAGNOSIS — R600 Localized edema: Secondary | ICD-10-CM | POA: Diagnosis not present

## 2022-03-27 DIAGNOSIS — R7301 Impaired fasting glucose: Secondary | ICD-10-CM | POA: Diagnosis not present

## 2022-03-27 DIAGNOSIS — I10 Essential (primary) hypertension: Secondary | ICD-10-CM | POA: Diagnosis not present

## 2022-03-27 DIAGNOSIS — N183 Chronic kidney disease, stage 3 unspecified: Secondary | ICD-10-CM | POA: Diagnosis not present

## 2022-03-27 DIAGNOSIS — I2510 Atherosclerotic heart disease of native coronary artery without angina pectoris: Secondary | ICD-10-CM | POA: Diagnosis not present

## 2022-03-27 DIAGNOSIS — R6889 Other general symptoms and signs: Secondary | ICD-10-CM | POA: Diagnosis not present

## 2022-03-27 DIAGNOSIS — M545 Low back pain, unspecified: Secondary | ICD-10-CM | POA: Diagnosis not present

## 2022-03-28 DIAGNOSIS — Z13820 Encounter for screening for osteoporosis: Secondary | ICD-10-CM | POA: Diagnosis not present

## 2022-03-28 DIAGNOSIS — M810 Age-related osteoporosis without current pathological fracture: Secondary | ICD-10-CM | POA: Diagnosis not present

## 2022-03-29 DIAGNOSIS — Z471 Aftercare following joint replacement surgery: Secondary | ICD-10-CM | POA: Diagnosis not present

## 2022-03-29 DIAGNOSIS — M6281 Muscle weakness (generalized): Secondary | ICD-10-CM | POA: Diagnosis not present

## 2022-03-29 DIAGNOSIS — Z96651 Presence of right artificial knee joint: Secondary | ICD-10-CM | POA: Diagnosis not present

## 2022-03-29 DIAGNOSIS — R2689 Other abnormalities of gait and mobility: Secondary | ICD-10-CM | POA: Diagnosis not present

## 2022-05-08 DIAGNOSIS — J069 Acute upper respiratory infection, unspecified: Secondary | ICD-10-CM | POA: Diagnosis not present

## 2022-05-25 ENCOUNTER — Ambulatory Visit: Payer: Medicare HMO | Admitting: Orthopaedic Surgery

## 2022-05-25 ENCOUNTER — Ambulatory Visit (INDEPENDENT_AMBULATORY_CARE_PROVIDER_SITE_OTHER): Payer: Medicare HMO

## 2022-05-25 DIAGNOSIS — M25562 Pain in left knee: Secondary | ICD-10-CM | POA: Diagnosis not present

## 2022-05-25 DIAGNOSIS — R6889 Other general symptoms and signs: Secondary | ICD-10-CM | POA: Diagnosis not present

## 2022-05-25 DIAGNOSIS — G8929 Other chronic pain: Secondary | ICD-10-CM

## 2022-05-25 DIAGNOSIS — Z96651 Presence of right artificial knee joint: Secondary | ICD-10-CM

## 2022-05-25 NOTE — Progress Notes (Signed)
The patient is now 5 months status post a right total knee arthroplasty.  She is 71 years old and does ambulate with a cane.  She said the right knee is doing well but the left knee is really painful to her.  She has well-documented arthritis of the left knee.  She said that knee has been giving out on her.  She wants to consider knee replacement of that knee sometime next year.  Her right operative knee has almost full flexion and extension.  It feels ligamentously stable.  The left knee has slight varus malalignment and pain throughout the arc of motion of exam.  It does have tricompartment arthritis in it.  2 views of the right knee show well-seated total knee arthroplasty with no complicating features.  Will put her in a hinged knee brace for support for her left knee.  She will continue her cane since she has been falling.  She is already been having therapy coming to her house to help with her balance and coordination.  From my standpoint we will see her back in 3 months to see how she is doing overall but no x-rays are needed.  We can talk about the possibility of a left knee replacement at that visit.

## 2022-07-10 DIAGNOSIS — N183 Chronic kidney disease, stage 3 unspecified: Secondary | ICD-10-CM | POA: Diagnosis not present

## 2022-07-10 DIAGNOSIS — I10 Essential (primary) hypertension: Secondary | ICD-10-CM | POA: Diagnosis not present

## 2022-07-13 DIAGNOSIS — G629 Polyneuropathy, unspecified: Secondary | ICD-10-CM | POA: Diagnosis not present

## 2022-07-13 DIAGNOSIS — R2681 Unsteadiness on feet: Secondary | ICD-10-CM | POA: Diagnosis not present

## 2022-07-13 DIAGNOSIS — R6889 Other general symptoms and signs: Secondary | ICD-10-CM | POA: Diagnosis not present

## 2022-07-13 DIAGNOSIS — N1831 Chronic kidney disease, stage 3a: Secondary | ICD-10-CM | POA: Diagnosis not present

## 2022-07-13 DIAGNOSIS — Z0001 Encounter for general adult medical examination with abnormal findings: Secondary | ICD-10-CM | POA: Diagnosis not present

## 2022-07-13 DIAGNOSIS — I2510 Atherosclerotic heart disease of native coronary artery without angina pectoris: Secondary | ICD-10-CM | POA: Diagnosis not present

## 2022-07-13 DIAGNOSIS — M2550 Pain in unspecified joint: Secondary | ICD-10-CM | POA: Diagnosis not present

## 2022-07-13 DIAGNOSIS — R7301 Impaired fasting glucose: Secondary | ICD-10-CM | POA: Diagnosis not present

## 2022-07-13 DIAGNOSIS — I350 Nonrheumatic aortic (valve) stenosis: Secondary | ICD-10-CM | POA: Diagnosis not present

## 2022-07-13 DIAGNOSIS — E7849 Other hyperlipidemia: Secondary | ICD-10-CM | POA: Diagnosis not present

## 2022-07-24 DIAGNOSIS — M4802 Spinal stenosis, cervical region: Secondary | ICD-10-CM | POA: Diagnosis not present

## 2022-07-24 DIAGNOSIS — R2681 Unsteadiness on feet: Secondary | ICD-10-CM | POA: Diagnosis not present

## 2022-07-24 DIAGNOSIS — R519 Headache, unspecified: Secondary | ICD-10-CM | POA: Diagnosis not present

## 2022-07-24 DIAGNOSIS — I6782 Cerebral ischemia: Secondary | ICD-10-CM | POA: Diagnosis not present

## 2022-07-24 DIAGNOSIS — M47812 Spondylosis without myelopathy or radiculopathy, cervical region: Secondary | ICD-10-CM | POA: Diagnosis not present

## 2022-07-24 DIAGNOSIS — G609 Hereditary and idiopathic neuropathy, unspecified: Secondary | ICD-10-CM | POA: Diagnosis not present

## 2022-07-24 DIAGNOSIS — G9520 Unspecified cord compression: Secondary | ICD-10-CM | POA: Diagnosis not present

## 2022-07-24 DIAGNOSIS — Z8673 Personal history of transient ischemic attack (TIA), and cerebral infarction without residual deficits: Secondary | ICD-10-CM | POA: Diagnosis not present

## 2022-08-24 ENCOUNTER — Ambulatory Visit: Payer: Medicare HMO | Admitting: Orthopaedic Surgery

## 2022-08-24 ENCOUNTER — Encounter: Payer: Self-pay | Admitting: Orthopaedic Surgery

## 2022-08-24 DIAGNOSIS — Z96651 Presence of right artificial knee joint: Secondary | ICD-10-CM | POA: Diagnosis not present

## 2022-08-24 DIAGNOSIS — M1712 Unilateral primary osteoarthritis, left knee: Secondary | ICD-10-CM | POA: Insufficient documentation

## 2022-08-24 DIAGNOSIS — M25562 Pain in left knee: Secondary | ICD-10-CM

## 2022-08-24 DIAGNOSIS — G8929 Other chronic pain: Secondary | ICD-10-CM

## 2022-08-24 DIAGNOSIS — R6889 Other general symptoms and signs: Secondary | ICD-10-CM | POA: Diagnosis not present

## 2022-08-24 NOTE — Progress Notes (Signed)
The patient is a 72 year old female well-known to me.  We replaced her right knee in June of last year and she said that knee is doing great.  She has well-documented severe arthritis in her left knee.  At this point she is interested in a left knee replacement.  She has tried and failed conservative treatment for over a year as a relates to her left knee as well including physical therapy and injections.  She does ambulate still with a cane due to her left knee.  She is is very painful to ambulate with the right knee is doing excellent.  Right knee has excellent range of motion and is stable.  Her left knee has slight varus malalignment with global tenderness.  There is patellofemoral crepitation and pain throughout the arc of motion.  Previous x-rays show significant tricompartment arthritis of both knees.  However the right knee is now been replaced.  Having had the surgery before on the right knee she is fully aware of the risk and benefits of surgery and what to expect an intraoperative and postoperative course.  We will work on getting her scheduled in the near future for a left total knee arthroplasty.  If there are issues before then medically she knows to let us know.

## 2022-09-05 ENCOUNTER — Encounter: Payer: Self-pay | Admitting: Diagnostic Neuroimaging

## 2022-09-05 ENCOUNTER — Ambulatory Visit: Payer: Medicare HMO | Admitting: Diagnostic Neuroimaging

## 2022-09-05 VITALS — BP 198/96 | HR 53 | Ht 67.0 in | Wt 230.0 lb

## 2022-09-05 DIAGNOSIS — M4802 Spinal stenosis, cervical region: Secondary | ICD-10-CM | POA: Diagnosis not present

## 2022-09-05 DIAGNOSIS — R269 Unspecified abnormalities of gait and mobility: Secondary | ICD-10-CM

## 2022-09-05 DIAGNOSIS — M1712 Unilateral primary osteoarthritis, left knee: Secondary | ICD-10-CM | POA: Diagnosis not present

## 2022-09-05 DIAGNOSIS — M48062 Spinal stenosis, lumbar region with neurogenic claudication: Secondary | ICD-10-CM | POA: Diagnosis not present

## 2022-09-05 NOTE — Progress Notes (Signed)
[facility]  PATIENT: [name] [name] DOB: 09-14-1950  REFERRING CLINICIAN: Curlene Labrum, MD HISTORY FROM: patient  REASON FOR VISIT: new consult   HISTORICAL  CHIEF COMPLAINT:  Chief Complaint  Patient presents with   Gait Problem    Rm 6 with son Winferd Humphrey Pt is well, reports she has been having tingling, numbness, burning, pins and needles sensation in L leg for about a year as well as imbalance.     HISTORY OF PRESENT ILLNESS:   72 year old female here for evaluation of gait difficulty.  For past 1 to 2 years patient has had progressive gait and balance difficulty, feeling like she is falling forward.  She is having more difficulty with standing and turning.  Sometimes her legs give out.  She is also noted some numbness and tingling in her bilateral hands.  Has some chronic low back issues, status post lumbar decompression surgery x 2.  Also has had bilateral knee arthritis status post right knee surgery in 2023.  She needs left knee replacement surgery as well.   REVIEW OF SYSTEMS: Full 14 system review of systems performed and negative with exception of: as per HPI.  ALLERGIES: No Known Allergies  HOME MEDICATIONS: Outpatient Medications Prior to Visit  Medication Sig Dispense Refill   albuterol (VENTOLIN HFA) 108 (90 Base) MCG/ACT inhaler Inhale 1 puff into the lungs every 4 (four) hours as needed.     aspirin 81 MG chewable tablet Chew 1 tablet (81 mg total) by mouth 2 (two) times daily. 30 tablet 0   atorvastatin (LIPITOR) 40 MG tablet Take 40 mg by mouth daily.     Cholecalciferol (VITAMIN D) 50 MCG (2000 UT) tablet Take 2,000 Units by mouth daily.     famotidine (PEPCID) 20 MG tablet Take 20 mg by mouth daily as needed for heartburn or indigestion.     gabapentin (NEURONTIN) 800 MG tablet Take 800 mg by mouth 3 (three) times daily.     hydrochlorothiazide (HYDRODIURIL) 25 MG tablet Take 25 mg by mouth daily.      HYDROcodone-acetaminophen (NORCO) 10-325 MG tablet Take 0.5 tablets by mouth every 6 (six) hours as needed. 14 tablet 0   lisinopril (PRINIVIL,ZESTRIL) 40 MG tablet Take 40 mg by mouth daily.     metFORMIN (GLUCOPHAGE) 500 MG tablet Take 500 mg by mouth daily.     acetaminophen (TYLENOL) 500 MG tablet Take 1,000 mg by mouth every 6 (six) hours as needed for moderate pain or headache. (Patient not taking: Reported on 09/05/2022)     amLODipine (NORVASC) 10 MG tablet Take 10 mg by mouth daily. (Patient not taking: Reported on 09/05/2022)     diclofenac Sodium (VOLTAREN) 1 % GEL Apply 1 application  topically 4 (four) times daily as needed for pain. (Patient not taking: Reported on 09/05/2022)     diphenhydramine-acetaminophen (TYLENOL PM) 25-500 MG TABS tablet Take 2 tablets by mouth at bedtime as needed (sleep). (Patient not taking: Reported on 09/05/2022)     gabapentin (NEURONTIN) 300 MG capsule Take 1 capsule (300 mg total) by mouth 3 (three) times daily. (Patient not taking: Reported on 09/05/2022) 90 capsule 3   gabapentin (NEURONTIN) 400 MG capsule Take 400 mg by mouth 3 (three) times daily. (Patient not taking: Reported on 09/05/2022)     meclizine (ANTIVERT) 12.5 MG tablet Take 12.5 mg by mouth 3 (three) times daily as needed for dizziness. (Patient not taking: Reported on 09/05/2022)     methocarbamol (ROBAXIN) 500 MG  tablet Take 1 tablet (500 mg total) by mouth every 6 (six) hours as needed for muscle spasms. (Patient not taking: Reported on 09/05/2022) 20 tablet 0   oxyCODONE (OXY IR/ROXICODONE) 5 MG immediate release tablet Take 1-2 tablets (5-10 mg total) by mouth every 4 (four) hours as needed for moderate pain (pain score 4-6). (Patient not taking: Reported on 09/05/2022) 20 tablet 0   No facility-administered medications prior to visit.    PAST MEDICAL HISTORY: Past Medical History:  Diagnosis Date   Arthritis    Carpal tunnel syndrome    Chronic kidney disease (CKD), stage II (mild)     Constipation    Coronary artery disease    NSTEMI 2004, DES to ostial RCA   DDD (degenerative disc disease)    Essential hypertension    GERD (gastroesophageal reflux disease)    Hyperlipidemia    IBS (irritable bowel syndrome)    Low back pain    Myocardial infarction ([place]) 2004   Postmenopausal    Pulmonary nodule    Sleep apnea    "waiting on machine to come".   Type 2 diabetes mellitus ([place])    Vertigo     PAST SURGICAL HISTORY: Past Surgical History:  Procedure Laterality Date   ABDOMINAL HYSTERECTOMY     partial   CARDIAC CATHETERIZATION N/A 04/28/2015   Procedure: Left Heart Cath and Coronary Angiography;  Surgeon: Sherren Mocha, MD;  Location: [place] [facility];  Service: Cardiovascular;  Laterality: N/A;   CATARACT EXTRACTION W/PHACO Right 07/19/2020   Procedure: CATARACT EXTRACTION PHACO AND INTRAOCULAR LENS PLACEMENT RIGHT EYE;  Surgeon: Baruch Goldmann, MD;  Location: [facility];  Service: Ophthalmology;  Laterality: Right;  right CDE=12.56   CATARACT EXTRACTION W/PHACO Left 08/09/2020   Procedure: CATARACT EXTRACTION PHACO AND INTRAOCULAR LENS PLACEMENT (IOC);  Surgeon: Baruch Goldmann, MD;  Location: [facility];  Service: Ophthalmology;  Laterality: Left;  CDE: 6.66   Cervical disectomy and fusion     Cholestectomy     COLONOSCOPY N/A 05/07/2019   Procedure: COLONOSCOPY;  Surgeon: Daneil Dolin, MD;  Location: [facility];  Service: Endoscopy;  Laterality: N/A;  12:00   Excision of ovarian cyst     L4-5 with pedicle screws and rods     x2   MULTIPLE TOOTH EXTRACTIONS     Right shoulder RTC     TOTAL KNEE ARTHROPLASTY Right 12/16/2021   Procedure: RIGHT TOTAL KNEE ARTHROPLASTY;  Surgeon: Mcarthur Rossetti, MD;  Location: [facility];  Service: Orthopedics;  Laterality: Right;    FAMILY HISTORY: Family History  Problem Relation Age of Onset   Stroke Mother    Diabetes Mellitus II Mother    Kidney disease Father    Hypertension Brother    Diabetes Mellitus II  Brother    Diabetes Mellitus II Sister     SOCIAL HISTORY: Social History   Socioeconomic History   Marital status: Widowed    Spouse name: Not on file   Number of children: Not on file   Years of education: Not on file   Highest education level: Not on file  Occupational History   Occupation: Oncologist, Clinical cytogeneticist  Tobacco Use   Smoking status: Never   Smokeless tobacco: Never  Vaping Use   Vaping Use: Never used  Substance and Sexual Activity   Alcohol use: No    Alcohol/week: 0.0 standard drinks of alcohol   Drug use: No   Sexual activity: Never  Other Topics Concern   Not on  file  Social History Narrative   Married and lives with husband   Disabled due to lumbosacral spine disease   10th grade education      Social Determinants of Health   Financial Resource Strain: Not on file  Food Insecurity: Not on file  Transportation Needs: Not on file  Physical Activity: Not on file  Stress: Not on file  Social Connections: Not on file  Intimate Partner Violence: Not on file     PHYSICAL EXAM  GENERAL EXAM/CONSTITUTIONAL: Vitals:  Vitals:   09/05/22 1118 09/05/22 1123  BP: (!) 201/101 (!) 198/96  Pulse: (!) 55 (!) 53  Weight: 230 lb (104.3 kg)   Height: '5\' 7"'$  (1.702 m)    Body mass index is 36.02 kg/m. Wt Readings from Last 3 Encounters:  09/05/22 230 lb (104.3 kg)  01/12/22 232 lb (105.2 kg)  12/16/21 248 lb 7.3 oz (112.7 kg)   Patient is in no distress; well developed, nourished and groomed; neck is supple  CARDIOVASCULAR: Examination of carotid arteries is normal; no carotid bruits Regular rate and rhythm, no murmurs Examination of peripheral vascular system by observation and palpation is normal  EYES: Ophthalmoscopic exam of optic discs and posterior segments is normal; no papilledema or hemorrhages No results found.  MUSCULOSKELETAL: Gait, strength, tone, movements noted in Neurologic exam below  NEUROLOGIC: MENTAL STATUS:      No  data to display         awake, alert, oriented to person, place and time recent and remote memory intact normal attention and concentration language fluent, comprehension intact, naming intact fund of knowledge appropriate  CRANIAL NERVE:  2nd - no papilledema on fundoscopic exam 2nd, 3rd, 4th, 6th - pupils equal and reactive to light, visual fields full to confrontation, extraocular muscles intact, no nystagmus 5th - facial sensation symmetric 7th - facial strength symmetric 8th - hearing intact 9th - palate elevates symmetrically, uvula midline 11th - shoulder shrug symmetric 12th - tongue protrusion midline MASKED FACIES  MOTOR:  WAXY FLEXIBILITY OF BUE BRADYKINESIA OF BUE AND BLE normal bulk and tone, full strength in the BUE, BLE  SENSORY:  normal and symmetric to light touch, temperature, vibration  COORDINATION:  finger-nose-finger, fine finger movements normal  REFLEXES:  deep tendon reflexes TRACE and symmetric  GAIT/STATION:  narrow based gait; VARUS KNEES     DIAGNOSTIC DATA (LABS, IMAGING, TESTING) - I reviewed patient records, labs, notes, testing and imaging myself where available.  Lab Results  Component Value Date   WBC 9.0 12/17/2021   HGB 11.7 (L) 12/17/2021   HCT 35.8 (L) 12/17/2021   MCV 94.2 12/17/2021   PLT 177 12/17/2021      Component Value Date/Time   NA 144 12/12/2021 1327   K 4.3 12/12/2021 1327   CL 109 12/12/2021 1327   CO2 28 12/12/2021 1327   GLUCOSE 102 (H) 12/12/2021 1327   BUN 13 12/12/2021 1327   CREATININE 1.02 (H) 12/12/2021 1327   CALCIUM 10.0 12/12/2021 1327   PROT 7.3 01/26/2021 1200   ALBUMIN 4.1 01/26/2021 1200   AST 27 01/26/2021 1200   ALT 21 01/26/2021 1200   ALKPHOS 66 01/26/2021 1200   BILITOT 1.0 01/26/2021 1200   GFRNONAA 58 (L) 12/12/2021 1327   GFRAA 57 (L) 04/28/2015 0807   Lab Results  Component Value Date   CHOL 170 12/16/2009   HDL 49 12/16/2009   LDLCALC 84 12/16/2009   TRIG 186  12/16/2009   CHOLHDL 5.2 Ratio 11/30/2008  Lab Results  Component Value Date   HGBA1C 5.7 (H) 12/12/2021   No results found for: "VITAMINB12" Lab Results  Component Value Date   TSH 2.250 12/16/2009    11/01/20 MRI lumbar spine [I reviewed images myself and agree with interpretation. -VRP]  1. L3-4 marked adjacent segment degeneration with advanced spinal and biforaminal stenosis, new from 2008. 2. L1-2 and L2-3 facet osteoarthritis asymmetric to the right. Spurring on the right at L2-3 impinges on the descending right L3 nerve root. 3. Solid arthrodesis at L4-5 and L5-S1. No recurrent impingement at the postoperative levels.  07/24/22 MRI brain [I reviewed images myself and agree with interpretation. -VRP]  1. No evidence of acute intracranial abnormality.  2. Moderate chronic small vessel ischemic changes within the  cerebral white matter and pons.  3. Chronic lacunar infarct within the right thalamus.  4. Incompletely assessed cervical spondylosis. At C3-C4, a disc  bulge/protrusion contributes to apparent severe spinal canal  stenosis with spinal cord flattening. A dedicated cervical spine MRI  is recommended for further evaluation.    ASSESSMENT AND PLAN  72 y.o. year old female here with:   Dx:  1. Gait difficulty   2. Cervical spinal stenosis   3. Spinal stenosis of lumbar region with neurogenic claudication   4. Unilateral primary osteoarthritis, left knee      PLAN:  AKINETIC-RIGID / GAIT DIFF / BALANCE ISSUES (h/o lumbar spinal stenosis s/p decompression x 2; s/ right knee arthroplasty; current left knee severe arthritis; could be related to underlying orthopedic issues; will check testing to rule out other neurologic issues) - ddx: cervical myelopathy, akinetic rigid parkinsonism, neurodegenerative dz, stiff person syndrome, b12 deficiency - check MRI cervical spine (spinal stenosis noted at C3-4 on MRI from 2024; follow-up needed) - check DATscan (eval  for subtle parkinsonism) - check labs  Orders Placed This Encounter  Procedures   NM BRAIN DATSCAN TUMOR LOC INFLAM SPECT 1 DAY   MR CERVICAL SPINE WO CONTRAST   Glutamic acid decarboxylase auto abs   Vitamin B12   Return for pending test results, pending if symptoms worsen or fail to improve.  I reviewed images, labs, notes, records myself. I summarized findings and reviewed with patient, for this high risk condition (possible cervical myelopathy vs parkinsonism) requiring high complexity decision making.    Penni Bombard, MD [id number], [id number] PM Certified in Neurology, Neurophysiology and Neuroimaging  [facility] [address] [place], [place] [address] [phone]

## 2022-09-06 ENCOUNTER — Telehealth: Payer: Self-pay | Admitting: Diagnostic Neuroimaging

## 2022-09-06 NOTE — Telephone Encounter (Signed)
[employer] NPR, sent to [facility] nuclear medicine [phone]

## 2022-09-08 LAB — VITAMIN B12: Vitamin B-12: 183 pg/mL — ABNORMAL LOW (ref 232–1245)

## 2022-09-08 LAB — GLUTAMIC ACID DECARBOXYLASE AUTO ABS: Glutamic Acid Decarb Ab: <5 U/mL (ref 0.0–5.0)

## 2022-09-11 ENCOUNTER — Telehealth: Payer: Self-pay | Admitting: Diagnostic Neuroimaging

## 2022-09-11 NOTE — Telephone Encounter (Signed)
Mcarthur Rossetti Josem Kaufmann: [id number] exp. 09/11/22-10/11/22 for [facility]

## 2022-09-13 DIAGNOSIS — Z7984 Long term (current) use of oral hypoglycemic drugs: Secondary | ICD-10-CM | POA: Diagnosis not present

## 2022-09-13 DIAGNOSIS — H524 Presbyopia: Secondary | ICD-10-CM | POA: Diagnosis not present

## 2022-09-13 DIAGNOSIS — E119 Type 2 diabetes mellitus without complications: Secondary | ICD-10-CM | POA: Diagnosis not present

## 2022-09-13 DIAGNOSIS — Z961 Presence of intraocular lens: Secondary | ICD-10-CM | POA: Diagnosis not present

## 2022-09-28 ENCOUNTER — Telehealth: Payer: Self-pay

## 2022-09-28 ENCOUNTER — Other Ambulatory Visit (HOSPITAL_COMMUNITY): Payer: Medicare HMO

## 2022-09-28 NOTE — Telephone Encounter (Signed)
-----   Message from Penni Bombard, MD sent at 09/28/2022  1:14 PM EDT ----- Low B12; start vitamin B12 1066mcg daily and then PCP follow up of B12. Other lab ok. Follow up MRI scans. -VRP

## 2022-09-28 NOTE — Telephone Encounter (Signed)
Pt returned call, reiterated previous message regarding labs.

## 2022-09-28 NOTE — Telephone Encounter (Signed)
Contacted pt, LVM per DPR, informing her Low B12; start vitamin B12 1046mcg daily and then PCP follow up of B12. Other lab ok. Advised to call office back with concerns. We will be in contact regarding imaging results once completed.

## 2022-09-29 ENCOUNTER — Encounter (HOSPITAL_COMMUNITY)
Admission: RE | Admit: 2022-09-29 | Discharge: 2022-09-29 | Disposition: A | Discharge status: Home / Self Care | Payer: Medicare HMO | Source: Ambulatory Visit | Attending: Diagnostic Neuroimaging | Admitting: Diagnostic Neuroimaging

## 2022-09-29 ENCOUNTER — Ambulatory Visit (HOSPITAL_COMMUNITY)
Admission: RE | Admit: 2022-09-29 | Discharge: 2022-09-29 | Disposition: A | Discharge status: Home / Self Care | Payer: Medicare HMO | Source: Ambulatory Visit | Attending: Diagnostic Neuroimaging | Admitting: Diagnostic Neuroimaging

## 2022-09-29 DIAGNOSIS — R269 Unspecified abnormalities of gait and mobility: Secondary | ICD-10-CM | POA: Diagnosis not present

## 2022-09-29 DIAGNOSIS — M4802 Spinal stenosis, cervical region: Secondary | ICD-10-CM | POA: Diagnosis not present

## 2022-09-29 MED ORDER — POTASSIUM IODIDE (ANTIDOTE) 130 MG PO TABS
ORAL_TABLET | ORAL | Status: AC
  Filled 2022-09-29: qty 1

## 2022-09-29 MED ORDER — POTASSIUM IODIDE (ANTIDOTE) 130 MG PO TABS
130.0000 mg | ORAL_TABLET | Freq: Once | ORAL | Status: AC
  Administered 2022-09-29: 130 mg via ORAL

## 2022-09-29 MED ORDER — IOFLUPANE I 123 185 MBQ/2.5ML IV SOLN
4.5000 | Freq: Once | INTRAVENOUS | Status: AC | PRN
  Administered 2022-09-29: 4.5 via INTRAVENOUS

## 2022-10-02 ENCOUNTER — Telehealth: Payer: Self-pay | Admitting: Diagnostic Neuroimaging

## 2022-10-02 NOTE — Telephone Encounter (Signed)
I called patient x 2; no answer. MRI cervical spine shows severe spinal stenosis with cord signal changes at C3-4. Waiting for final report on MRI. Will refer to Neurosurgery spine surgery for evaluation. -VRP

## 2022-10-02 NOTE — Telephone Encounter (Signed)
Contacted pt, informed her Normal DATscan results. Advised to call the office back with questions or concens as she had none at this time and was appreciative.

## 2022-10-03 ENCOUNTER — Telehealth: Payer: Self-pay | Admitting: Diagnostic Neuroimaging

## 2022-10-03 ENCOUNTER — Other Ambulatory Visit: Payer: Self-pay | Admitting: Diagnostic Neuroimaging

## 2022-10-03 DIAGNOSIS — G9520 Unspecified cord compression: Secondary | ICD-10-CM

## 2022-10-03 NOTE — Progress Notes (Signed)
Orders Placed This Encounter  Procedures   Ambulatory referral to Neurosurgery   Suanne Marker, MD 10/03/2022, 1:37 PM Certified in Neurology, Neurophysiology and Neuroimaging  [facility] [address] [place], [place] [address] [phone]

## 2022-10-03 NOTE — Telephone Encounter (Signed)
Urgent referral faxed to [facility] Neurosurgery (fax# [phone], phone# [phone])

## 2022-10-04 NOTE — Telephone Encounter (Signed)
Contacted pt again, LVM per DPR informing her MD tried to reach out to her regarding MRI C spine. Advised MRI cervical spine shows severe spinal stenosis (narrowing) with cord signal changes at C3-4. MD referred her to Neurosurgery spine surgery for evaluation. Number provided to call back .

## 2022-10-04 NOTE — Telephone Encounter (Signed)
Appointment is 4/15 at 2:30pm with Dr. Jake Samples

## 2022-10-06 IMAGING — MR MR LUMBAR SPINE WO/W CM
6 of 7 series · 32 of 48 positions shown · IV contrast (gadavist)
Comparison: Lumbar spine myelogram 08/09/2006

CLINICAL DATA: Lumbosacral spinal fusion.  Follow-up

EXAM:
MRI LUMBAR SPINE WITHOUT AND WITH CONTRAST
TECHNIQUE: Multiplanar and multiecho pulse sequences of the lumbar spine were
obtained without and with intravenous contrast.
CONTRAST:  7mL GADAVIST GADOBUTROL 1 MMOL/ML IV SOLN

[Series 5: T2 · sagittal · 4.0mm · 0.68mm/px · 4 of 17 slices shown (1 of 2)]
[im 1/17]
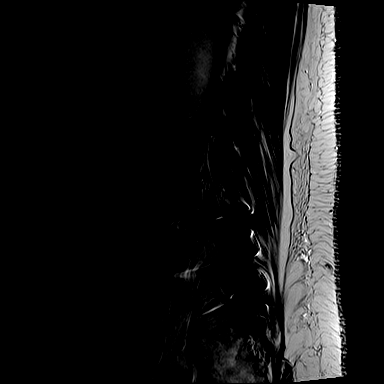
[im 6/17]
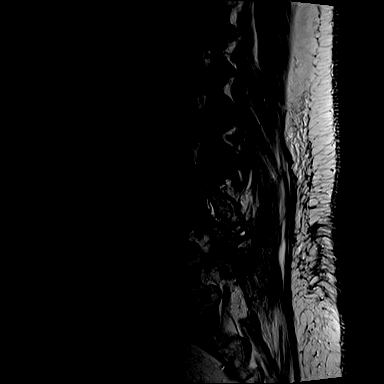
[im 11/17]
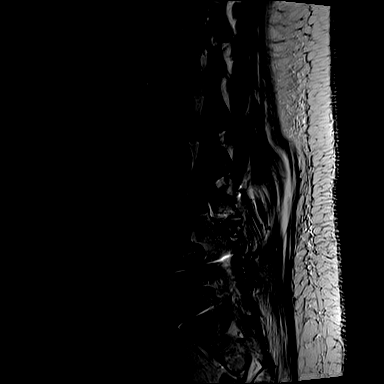
[im 17/17]
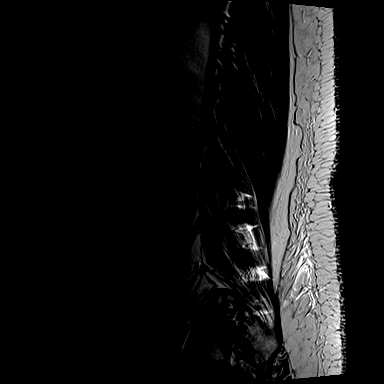

[Series 6: T1 · sagittal · 4.0mm · 0.81mm/px · 4 of 15 slices shown (1 of 3)]
[im 1/15]
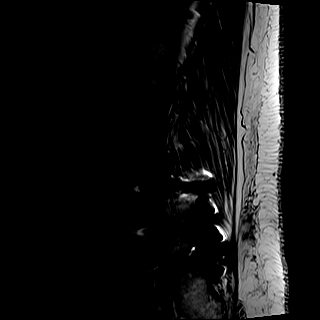
[im 5/15]
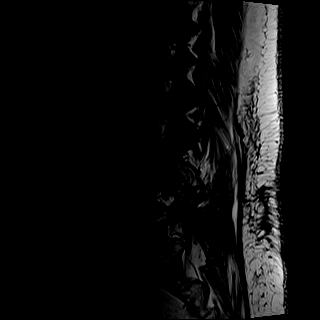
[im 10/15]
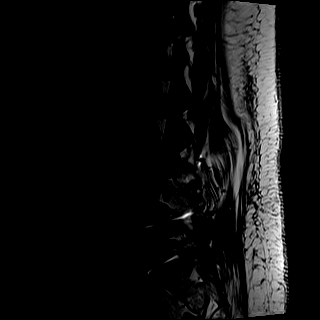
[im 15/15]
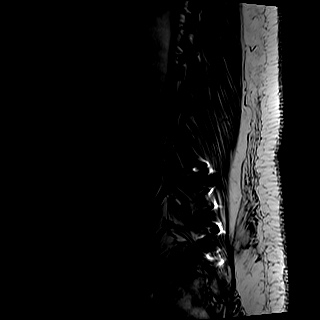

[Series 8: T2 · axial · 4.0mm · 0.70mm/px · z∈[-112,+61]mm · 8 of 31 slices shown (2 of 2)]
[im 1/31]
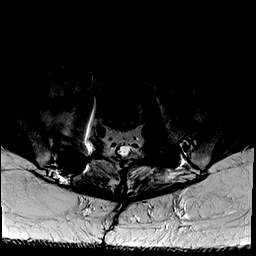
[im 4/31]
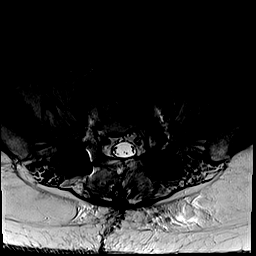
[im 11/31]
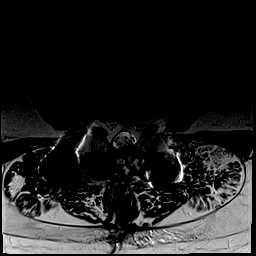
[im 14/31]
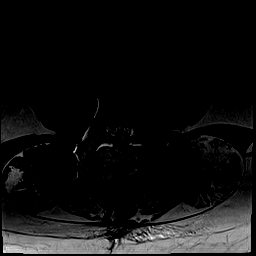
[im 17/31]
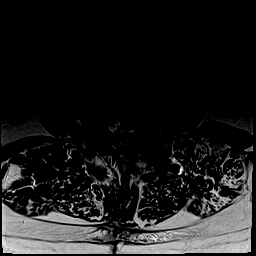
[im 21/31]
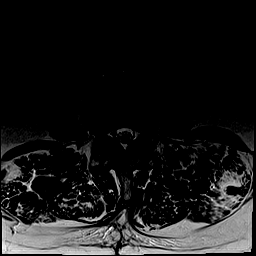
[im 27/31]
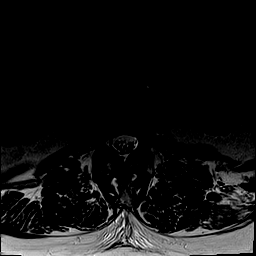
[im 31/31]
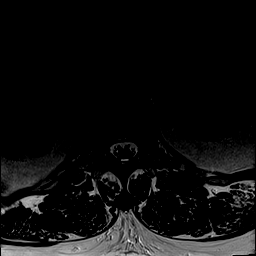

[Series 9: T1 · axial · 4.0mm · 0.35mm/px · z∈[-112,+61]mm · 8 of 31 slices shown (2 of 3)]
[im 1/31]
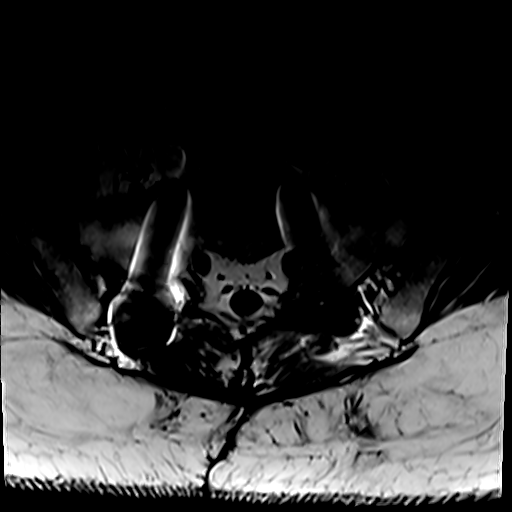
[im 4/31]
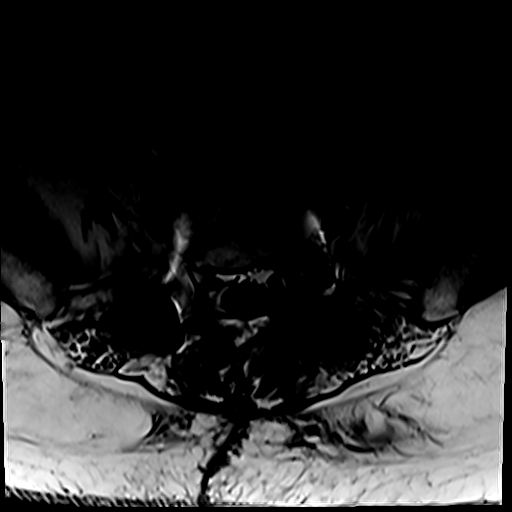
[im 11/31]
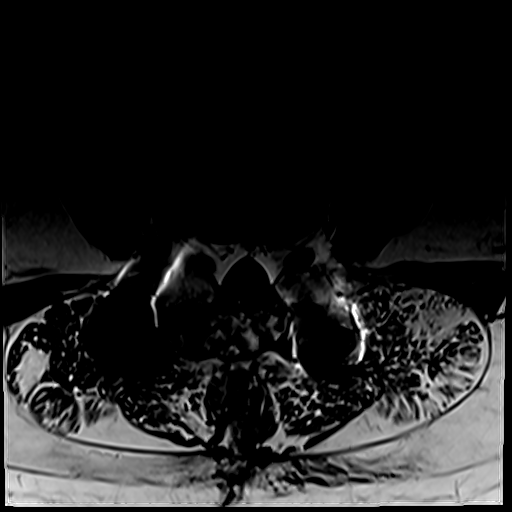
[im 14/31]
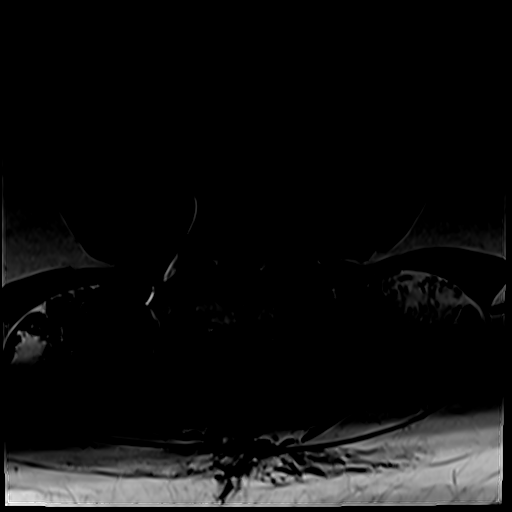
[im 17/31]
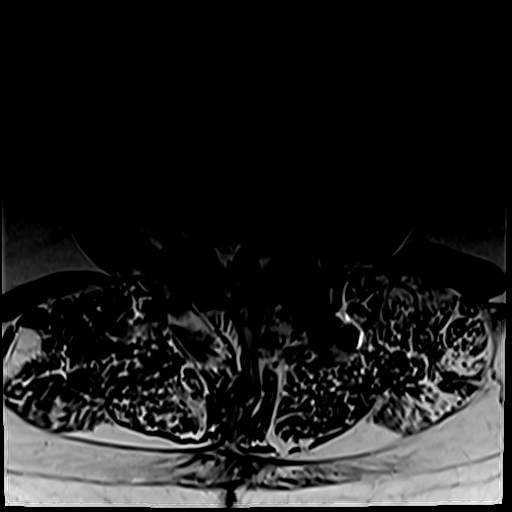
[im 21/31]
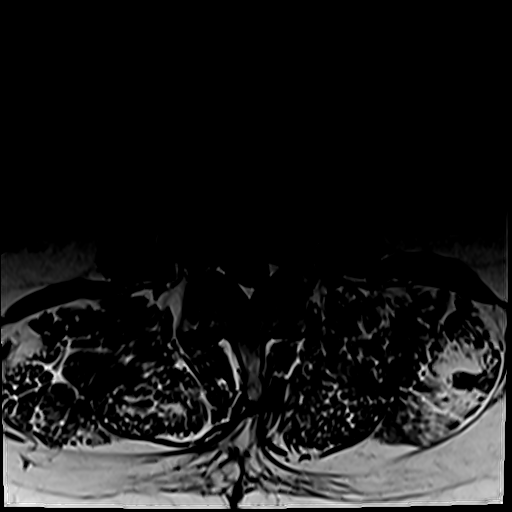
[im 27/31]
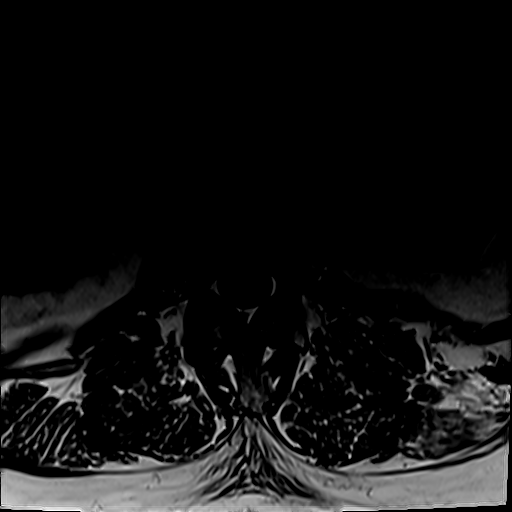
[im 31/31]
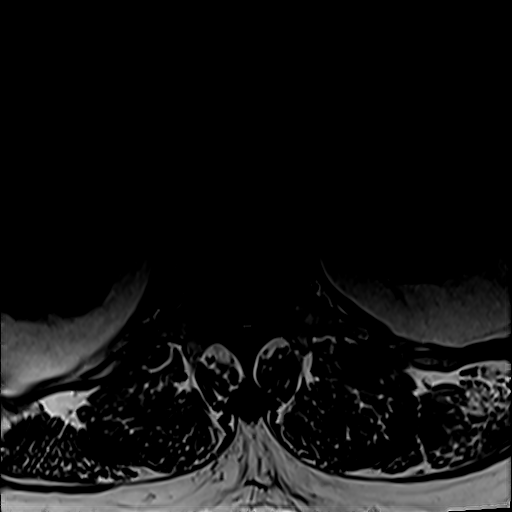

[Series 10: T1 fat-sat post-contrast · sagittal · 4.0mm · 0.90mm/px · 5 of 15 slices shown]
[im 1/15]
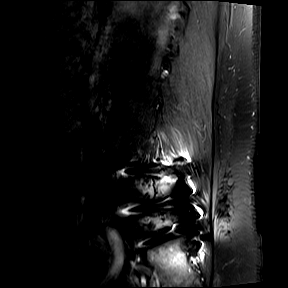
[im 4/15]
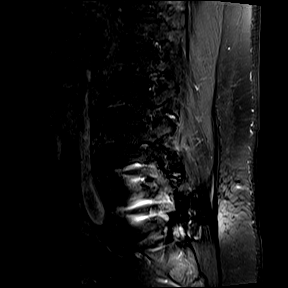
[im 8/15]
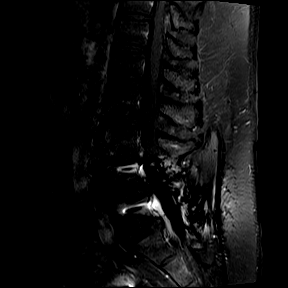
[im 11/15]
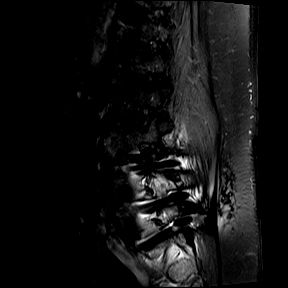
[im 15/15]
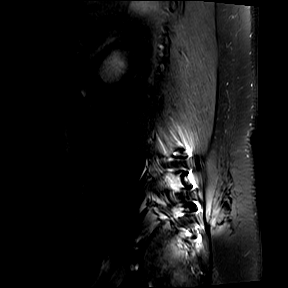

[Series 11: T1 · axial · 4.0mm · 0.35mm/px · z∈[-112,-55]mm · 3 of 31 slices shown (3 of 3)]
[im 1/31]
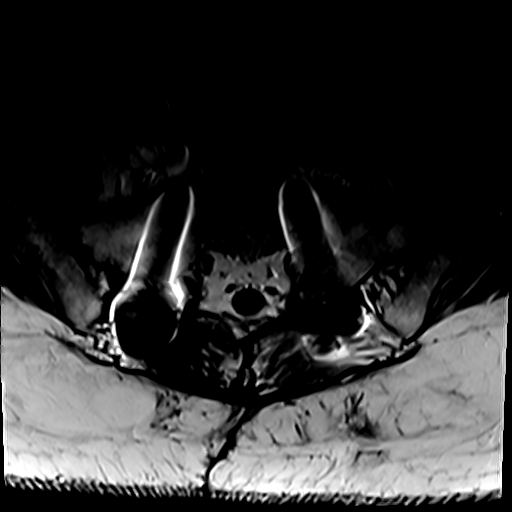
[im 4/31]
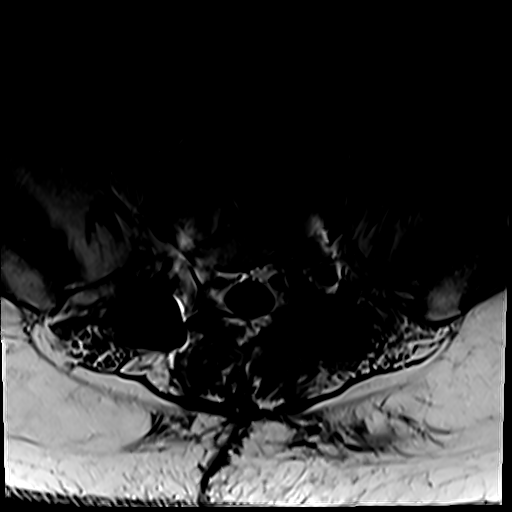
[im 11/31]
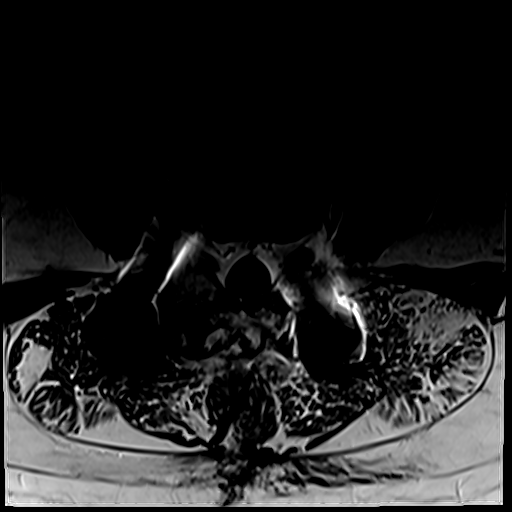

[32 of 48 positions shown; findings below may reference images not displayed]

FINDINGS: Segmentation:  5 lumbar type vertebrae

Alignment:  Physiologic

Vertebrae:  Solid arthrodesis at L4-5 and L5-S1.

Conus medullaris and cauda equina: Conus extends to the L1-2 level.
Conus appears normal. There is cauda equina distortion due to the
degree of severe stenosis

Paraspinal and other soft tissues: Expected postoperative scarring
and fatty muscular atrophy at the levels of L4 and L5 laminectomy.

Disc levels:

L1-L2: Mild facet spurring asymmetric to the right

L2-L3: Degenerative facet spurring asymmetric to the right with spur
impinging on the descending right L3 nerve root. Mild annulus
bulging

L3-L4: Disc narrowing and bulging with broad central protrusion.
Severe facet osteoarthritis with spurring and joint effusions.
Marked spinal stenosis. Biforaminal L3 compression.

L4-L5: Solid arthrodesis.  No recurrent impingement

L5-S1:Solid arthrodesis.  No recurrent impingement
IMPRESSION: 1. L3-4 marked adjacent segment degeneration with advanced spinal
and biforaminal stenosis, new from 9881.
2. L1-2 and L2-3 facet osteoarthritis asymmetric to the right.
Spurring on the right at L2-3 impinges on the descending right L3
nerve root.
3. Solid arthrodesis at L4-5 and L5-S1. No recurrent impingement at
the postoperative levels.

## 2022-10-09 DIAGNOSIS — G959 Disease of spinal cord, unspecified: Secondary | ICD-10-CM | POA: Diagnosis not present

## 2022-10-09 DIAGNOSIS — M4802 Spinal stenosis, cervical region: Secondary | ICD-10-CM | POA: Diagnosis not present

## 2022-10-09 DIAGNOSIS — Z6835 Body mass index (BMI) 35.0-35.9, adult: Secondary | ICD-10-CM | POA: Diagnosis not present

## 2022-10-09 DIAGNOSIS — R6889 Other general symptoms and signs: Secondary | ICD-10-CM | POA: Diagnosis not present

## 2022-10-12 ENCOUNTER — Other Ambulatory Visit (HOSPITAL_COMMUNITY): Payer: Self-pay | Admitting: Neurological Surgery

## 2022-10-12 DIAGNOSIS — G959 Disease of spinal cord, unspecified: Secondary | ICD-10-CM

## 2022-10-16 ENCOUNTER — Other Ambulatory Visit: Payer: Self-pay

## 2022-10-19 ENCOUNTER — Telehealth: Payer: Self-pay

## 2022-10-19 NOTE — Telephone Encounter (Signed)
Call from patient to cancel upcoming left TKA on 11/14/22.  She is having to have cervical spine surgery first.  She will let us know when ready to reschedule.

## 2022-10-31 ENCOUNTER — Ambulatory Visit (HOSPITAL_COMMUNITY)
Admission: RE | Admit: 2022-10-31 | Discharge: 2022-10-31 | Disposition: A | Discharge status: Home / Self Care | Payer: Medicare HMO | Source: Ambulatory Visit | Attending: Neurological Surgery | Admitting: Neurological Surgery

## 2022-10-31 DIAGNOSIS — G959 Disease of spinal cord, unspecified: Secondary | ICD-10-CM | POA: Diagnosis not present

## 2022-10-31 DIAGNOSIS — M5001 Cervical disc disorder with myelopathy,  high cervical region: Secondary | ICD-10-CM | POA: Diagnosis not present

## 2022-11-06 DIAGNOSIS — G959 Disease of spinal cord, unspecified: Secondary | ICD-10-CM | POA: Diagnosis not present

## 2022-11-09 ENCOUNTER — Other Ambulatory Visit: Payer: Self-pay | Admitting: Neurological Surgery

## 2022-11-13 NOTE — Pre-Procedure Instructions (Signed)
Surgical Instructions    Your procedure is scheduled on Thursday, May 30th.  Report to [facility] Main Entrance "A" at 11:45 A.M., then check in with the Admitting office.  Call this number if you have problems the morning of surgery:  [phone]  If you have any questions prior to your surgery date call [phone]: Open Monday-Friday 8am-4pm If you experience any cold or flu symptoms such as cough, fever, chills, shortness of breath, etc. between now and your scheduled surgery, please notify us at the above number.     Remember:  Do not eat or drink after midnight the night before your surgery      Take these medicines the morning of surgery with A SIP OF WATER  atorvastatin (LIPITOR)  gabapentin (NEURONTIN)   If needed: albuterol (VENTOLIN HFA)- bring inhaler with you on day of surgery  famotidine (PEPCID)  HYDROcodone-acetaminophen (NORCO)    Follow your surgeon's instructions on when to stop Aspirin.  If no instructions were given by your surgeon then you will need to call the office to get those instructions.     As of today, STOP taking any Aleve, Naproxen, Ibuprofen, Motrin, Advil, Goody's, BC's, all herbal medications, fish oil, and all vitamins.   WHAT DO I DO ABOUT MY DIABETES MEDICATION?   Do not take metFORMIN (GLUCOPHAGE) the morning of surgery.    HOW TO MANAGE YOUR DIABETES BEFORE AND AFTER SURGERY  Why is it important to control my blood sugar before and after surgery? Improving blood sugar levels before and after surgery helps healing and can limit problems. A way of improving blood sugar control is eating a healthy diet by:  Eating less sugar and carbohydrates  Increasing activity/exercise  Talking with your doctor about reaching your blood sugar goals High blood sugars (greater than 180 mg/dL) can raise your risk of infections and slow your recovery, so you will need to focus on controlling your diabetes during the weeks before surgery. Make sure  that the doctor who takes care of your diabetes knows about your planned surgery including the date and location.  How do I manage my blood sugar before surgery? Check your blood sugar at least 4 times a day, starting 2 days before surgery, to make sure that the level is not too high or low.  Check your blood sugar the morning of your surgery when you wake up and every 2 hours until you get to the Short Stay unit.  If your blood sugar is less than 70 mg/dL, you will need to treat for low blood sugar: Do not take insulin. Treat a low blood sugar (less than 70 mg/dL) with  cup of clear juice (cranberry or apple), 4 glucose tablets, OR glucose gel. Recheck blood sugar in 15 minutes after treatment (to make sure it is greater than 70 mg/dL). If your blood sugar is not greater than 70 mg/dL on recheck, call [phone] for further instructions. Report your blood sugar to the short stay nurse when you get to Short Stay.  If you are admitted to the hospital after surgery: Your blood sugar will be checked by the staff and you will probably be given insulin after surgery (instead of oral diabetes medicines) to make sure you have good blood sugar levels. The goal for blood sugar control after surgery is 80-180 mg/dL.                    Do NOT Smoke (Tobacco/Vaping) for 24 hours prior to your  procedure.  If you use a CPAP at night, you may bring your mask/headgear for your overnight stay.   Contacts, glasses, piercing's, hearing aid's, dentures or partials may not be worn into surgery, please bring cases for these belongings.    For patients admitted to the hospital, discharge time will be determined by your treatment team.   Patients discharged the day of surgery will not be allowed to drive home, and someone needs to stay with them for 24 hours.  SURGICAL WAITING ROOM VISITATION Patients having surgery or a procedure may have no more than 2 support people in the waiting area - these visitors may  rotate.   Children under the age of 89 must have an adult with them who is not the patient. If the patient needs to stay at the hospital during part of their recovery, the visitor guidelines for inpatient rooms apply. Pre-op nurse will coordinate an appropriate time for 1 support person to accompany patient in pre-op.  This support person may not rotate.   Please refer to the [facility] website for the visitor guidelines for Inpatients (after your surgery is over and you are in a regular room).      Pre-operative 5 CHG Bath Instructions   You can play a key role in reducing the risk of infection after surgery. Your skin needs to be as free of germs as possible. You can reduce the number of germs on your skin by washing with CHG (chlorhexidine gluconate) soap before surgery. CHG is an antiseptic soap that kills germs and continues to kill germs even after washing.   DO NOT use if you have an allergy to chlorhexidine/CHG or antibacterial soaps. If your skin becomes reddened or irritated, stop using the CHG and notify one of our RNs at [phone].   Please shower with the CHG soap starting 4 days before surgery using the following schedule:     Please keep in mind the following:  DO NOT shave, including legs and underarms, starting the day of your first shower.   You may shave your face at any point before/day of surgery.  Place clean sheets on your bed the day you start using CHG soap. Use a clean washcloth (not used since being washed) for each shower. DO NOT sleep with pets once you start using the CHG.   CHG Shower Instructions:  If you choose to wash your hair and private area, wash first with your normal shampoo/soap.  After you use shampoo/soap, rinse your hair and body thoroughly to remove shampoo/soap residue.  Turn the water OFF and apply about 3 tablespoons (45 ml) of CHG soap to a CLEAN washcloth.  Apply CHG soap ONLY FROM YOUR NECK DOWN TO YOUR TOES (washing for 3-5  minutes)  DO NOT use CHG soap on face, private areas, open wounds, or sores.  Pay special attention to the area where your surgery is being performed.  If you are having back surgery, having someone wash your back for you may be helpful. Wait 2 minutes after CHG soap is applied, then you may rinse off the CHG soap.  Pat dry with a clean towel  Put on clean clothes/pajamas   If you choose to wear lotion, please use ONLY the CHG-compatible lotions on the back of this paper.     Additional instructions for the day of surgery: DO NOT APPLY any lotions, deodorants, cologne, or perfumes.   Put on clean/comfortable clothes.  Brush your teeth.  Ask your nurse before applying  any prescription medications to the skin. Do not wear jewelry or makeup Do not bring valuables to the hospital. [facility] is not responsible for any belongings or valuables. Do not wear nail polish, gel polish, artificial nails, or any other type of covering on natural nails (fingers and toes) If you have artificial nails or gel coating that need to be removed by a nail salon, please have this removed prior to surgery. Artificial nails or gel coating may interfere with anesthesia's ability to adequately monitor your vital signs.    CHG Compatible Lotions   Aveeno Moisturizing lotion  Cetaphil Moisturizing Cream  Cetaphil Moisturizing Lotion  Clairol Herbal Essence Moisturizing Lotion, Dry Skin  Clairol Herbal Essence Moisturizing Lotion, Extra Dry Skin  Clairol Herbal Essence Moisturizing Lotion, Normal Skin  Curel Age Defying Therapeutic Moisturizing Lotion with Alpha Hydroxy  Curel Extreme Care Body Lotion  Curel Soothing Hands Moisturizing Hand Lotion  Curel Therapeutic Moisturizing Cream, Fragrance-Free  Curel Therapeutic Moisturizing Lotion, Fragrance-Free  Curel Therapeutic Moisturizing Lotion, Original Formula  Eucerin Daily Replenishing Lotion  Eucerin Dry Skin Therapy Plus Alpha Hydroxy Crme  Eucerin Dry  Skin Therapy Plus Alpha Hydroxy Lotion  Eucerin Original Crme  Eucerin Original Lotion  Eucerin Plus Crme Eucerin Plus Lotion  Eucerin TriLipid Replenishing Lotion  Keri Anti-Bacterial Hand Lotion  Keri Deep Conditioning Original Lotion Dry Skin Formula Softly Scented  Keri Deep Conditioning Original Lotion, Fragrance Free Sensitive Skin Formula  Keri Lotion Fast Absorbing Fragrance Free Sensitive Skin Formula  Keri Lotion Fast Absorbing Softly Scented Dry Skin Formula  Keri Original Lotion  Keri Skin Renewal Lotion Keri Silky Smooth Lotion  Keri Silky Smooth Sensitive Skin Lotion  Nivea Body Creamy Conditioning Oil  Nivea Body Extra Enriched Lotion  Nivea Body Original Lotion  Nivea Body Sheer Moisturizing Lotion Nivea Crme  Nivea Skin Firming Lotion  NutraDerm 30 Skin Lotion  NutraDerm Skin Lotion  NutraDerm Therapeutic Skin Cream  NutraDerm Therapeutic Skin Lotion  ProShield Protective Hand Cream  Provon moisturizing lotion        Please read over the following fact sheets that you were given.    If you received a COVID test during your pre-op visit  it is requested that you wear a mask when out in public, stay away from anyone that may not be feeling well and notify your surgeon if you develop symptoms. If you have been in contact with anyone that has tested positive in the last 10 days please notify you surgeon.

## 2022-11-14 ENCOUNTER — Other Ambulatory Visit: Payer: Self-pay

## 2022-11-14 ENCOUNTER — Encounter (HOSPITAL_COMMUNITY)
Admission: RE | Admit: 2022-11-14 | Discharge: 2022-11-14 | Disposition: A | Discharge status: Home / Self Care | Payer: Medicare HMO | Source: Ambulatory Visit | Attending: Neurological Surgery | Admitting: Neurological Surgery

## 2022-11-14 ENCOUNTER — Encounter (HOSPITAL_COMMUNITY): Payer: Self-pay

## 2022-11-14 VITALS — BP 152/80 | HR 59 | Temp 98.4°F | Resp 18 | Ht 67.0 in | Wt 229.9 lb

## 2022-11-14 DIAGNOSIS — R001 Bradycardia, unspecified: Secondary | ICD-10-CM | POA: Insufficient documentation

## 2022-11-14 DIAGNOSIS — I517 Cardiomegaly: Secondary | ICD-10-CM | POA: Insufficient documentation

## 2022-11-14 DIAGNOSIS — E119 Type 2 diabetes mellitus without complications: Secondary | ICD-10-CM

## 2022-11-14 DIAGNOSIS — Z01818 Encounter for other preprocedural examination: Secondary | ICD-10-CM

## 2022-11-14 HISTORY — DX: Polyneuropathy, unspecified: G62.9

## 2022-11-14 LAB — TYPE AND SCREEN
ABO/RH(D): B POS
Antibody Screen: NEGATIVE

## 2022-11-14 LAB — CBC
HCT: 39.5 % (ref 36.0–46.0)
Hemoglobin: 12.9 g/dL (ref 12.0–15.0)
MCH: 30.1 pg (ref 26.0–34.0)
MCHC: 32.7 g/dL (ref 30.0–36.0)
MCV: 92.3 fL (ref 80.0–100.0)
Platelets: 195 10*3/uL (ref 150–400)
RBC: 4.28 MIL/uL (ref 3.87–5.11)
RDW: 13 % (ref 11.5–15.5)
WBC: 3.8 10*3/uL — ABNORMAL LOW (ref 4.0–10.5)
nRBC: 0 % (ref 0.0–0.2)

## 2022-11-14 LAB — BASIC METABOLIC PANEL
Anion gap: 9 (ref 5–15)
BUN: 13 mg/dL (ref 8–23)
CO2: 27 mmol/L (ref 22–32)
Calcium: 9.7 mg/dL (ref 8.9–10.3)
Chloride: 106 mmol/L (ref 98–111)
Creatinine, Ser: 1.18 mg/dL — ABNORMAL HIGH (ref 0.44–1.00)
GFR, Estimated: 49 mL/min — ABNORMAL LOW (ref >60)
Glucose, Bld: 98 mg/dL (ref 70–99)
Potassium: 4.1 mmol/L (ref 3.5–5.1)
Sodium: 142 mmol/L (ref 135–145)

## 2022-11-14 LAB — SURGICAL PCR SCREEN
MRSA, PCR: NEGATIVE
Staphylococcus aureus: NEGATIVE

## 2022-11-14 LAB — HEMOGLOBIN A1C
Hgb A1c MFr Bld: 6.1 % — ABNORMAL HIGH (ref 4.8–5.6)
Mean Plasma Glucose: 128.37 mg/dL

## 2022-11-14 LAB — GLUCOSE, CAPILLARY: Glucose-Capillary: 95 mg/dL (ref 70–99)

## 2022-11-14 NOTE — Progress Notes (Addendum)
PCP - Dr. Margot Ables Cardiologist - Per pt, she used to see one, but hasn't seen one in over a decade  PPM/ICD - Denies Device Orders - n/a Rep Notified - n/a  Chest x-ray - n/a EKG - 11/14/2022 Stress Test - 12/28/2020 ECHO - 03/15/2022 Cardiac Cath - 04/28/2015  Sleep Study - +OSA but unable to tolerate wearing CPAP  Pt states she is borderline DM. She does not have a meter at home and thus does not check her blood sugar. She is not aware of her normal fasting blood sugar range. CBG at pre-op ws 95. She has not had anything to eat or drink today. Just some water to take her medicine this morning. A1c result pending.  Last dose of GLP1 agonist- n/a GLP1 instructions: n/a  Blood Thinner Instructions: n/a Aspirin Instructions: Pt will stop her ASA one week prior to surgery. Her last dose will be May 23rd.  NPO after midnight  COVID TEST- n/a   Anesthesia review: Yes. Cardiac hx including MI with stent placed in 2004. Abnormal EKG review. Last office note requested from PCP.  Patient denies shortness of breath, fever, cough and chest pain at PAT appointment. Pt denies any respiratory illness/infection in the last two months.   All instructions explained to the patient, with a verbal understanding of the material. Patient agrees to go over the instructions while at home for a better understanding. Patient also instructed to self quarantine after being tested for COVID-19. The opportunity to ask questions was provided.

## 2022-11-17 NOTE — Anesthesia Preprocedure Evaluation (Signed)
Anesthesia Evaluation  Patient identified by MRN, date of birth, ID band Patient awake    Reviewed: Allergy & Precautions, NPO status , Patient's Chart, lab work & pertinent test results  Airway Mallampati: I  TM Distance: >3 FB Neck ROM: Full    Dental  (+) Edentulous Upper, Edentulous Lower, Dental Advisory Given   Pulmonary sleep apnea    breath sounds clear to auscultation       Cardiovascular hypertension, Pt. on medications + CAD   Rhythm:Regular Rate:Normal  Nuclear stress test 12/28/20 [facility]): Impression: 1. No reversible ischemia or infarction.  2. Normal left ventricular wall motion.  3. Left ventricular ejection fraction 65%  4. Non invasive risk stratification: Low  Echo: - Left ventricle: The cavity size was normal. Wall thickness was    increased in a pattern of mild LVH. Systolic function was    vigorous. The estimated ejection fraction was in the range of 65%    to 70%. Wall motion was normal; there were no regional wall    motion abnormalities. Doppler parameters are consistent with    abnormal left ventricular relaxation (grade 1 diastolic    dysfunction).  - Aortic valve: Trileaflet; mildly calcified leaflets. Left    coronary cusp mobility was mildly restricted.  - Mitral valve: Heavily calcified annulus. There was trivial    regurgitation.  - Left atrium: The atrium was mildly dilated.  - Right atrium: Central venous pressure (est): 3 mm Hg.  - Atrial septum: No defect or patent foramen ovale was identified.  - Tricuspid valve: There was trivial regurgitation.  - Pulmonary arteries: PA peak pressure: 25 mm Hg (S).  - Pericardium, extracardiac: There was no pericardial effusion.    Neuro/Psych  PSYCHIATRIC DISORDERS  Depression       GI/Hepatic Neg liver ROS,GERD  Medicated,,  Endo/Other  diabetes, Type 2, Oral Hypoglycemic Agents    Renal/GU CRFRenal disease     Musculoskeletal  (+)  Arthritis ,    Abdominal Normal abdominal exam  (+)   Peds  Hematology negative hematology ROS (+)   Anesthesia Other Findings onclusion  1. Widely patent RCA stent with no significant obstruction in that vessel. 2. Widely patent left main, LAD, and left circumflex 3. Normal LV function by echo 4. Normal LVEDP   Suspect noncardiac symptoms.     Reproductive/Obstetrics                              Anesthesia Physical Anesthesia Plan  ASA: 3  Anesthesia Plan: General   Post-op Pain Management:    Induction: Intravenous  PONV Risk Score and Plan: 4 or greater and Ondansetron, Dexamethasone, Midazolam and Treatment may vary due to age or medical condition  Airway Management Planned: Oral ETT  Additional Equipment: None  Intra-op Plan:   Post-operative Plan: Extubation in OR  Informed Consent: I have reviewed the patients History and Physical, chart, labs and discussed the procedure including the risks, benefits and alternatives for the proposed anesthesia with the patient or authorized representative who has indicated his/her understanding and acceptance.     Dental advisory given  Plan Discussed with: CRNA  Anesthesia Plan Comments: (PAT note by Antionette Poles, PA-C: 72 year old female with pertinent history including GERD, HTN, sleep apnea, CKD Stage II, DM II, CAD (NSTEMI, s/p cutting balloon atherectomy/DES [facility] 2004).  Last heart cath November 2016 showed widely patent RCA stent with otherwise no significant disease in the LAD  and left circumflex.  Echo in October 2016 showed EF 65 to 70%, grade 1 DD, no significant valvular abnormalities.  Pt had nuclear stress test ordered by PCP in June of 2022 for preop eval. Test done 12/28/20 at [facility] showed no reversible ischemia or infarction, EF 65%, low risk. Pt did subsequently undergo L#-4 TLIF 01/31/21 and  R TKA 12/16/21 without complication.   Patient states she has not seen cardiology  since 2016.  Chronic medical conditions are followed by her PCP Dr. [facility].  Reviewed last PCP note 07/13/2022, primary concern at that time was balance issues and difficulty with manual dexterity of fingers.  She was ultimately found to have severe cervical spine stenosis.  C-spine MR 09/29/2022 severe cervical spine stenosis at C3-4 and C4-5 with T2 hyperintense signal abnormality in the cervical spinal cord at the C3-C4 level, favored to represent myelomalacia.  Various prior notes in epic have mentioned the patient is s/p aortic valve replacement.  Patient denies this; states she has never had any cardiac intervention other than stent in 2004.  Cardiology notes from 2016 did not mention any history of valve replacement.  PCP notes also did not mention history of aortic valve replacement.  Echocardiogram does not mention history of aortic valve replacement. Clearly I do not think she has had an AVR; this appears to have been a mistake in charting.   NIDDM2, well controlled, A1c 6.1 on preop labs.   Preop labs reviewed, creatinine mildly elevated 1.18 c/w hx of CKD II, otherwise unremarkable.   EKG 11/14/22: Sinus bradycardia. Rate 57. Left ventricular hypertrophy with repolarization abnormality ( R in aVL , Cornell product )  Nuclear Stress 12/28/2020: IMPRESSION: 1. No reversible ischemia or infarction.  2. Normal left ventricular wall motion.  3. Left ventricular ejection fraction 65%  4. Non invasive risk stratification*: Low   Cardiac Cath 04/28/2015 1. Widely patent RCA stent with no significant obstruction in that vessel. 2. Widely patent left main, LAD, and left circumflex 3. Normal LV function by echo 4. Normal LVEDP   Suspect noncardiac symptoms.   Echo 04/01/2015 Study Conclusions   - Left ventricle: The cavity size was normal. Wall thickness was    increased in a pattern of mild LVH. Systolic function was    vigorous. The estimated ejection  fraction was in the range of 65%    to 70%. Wall motion was normal; there were no regional wall    motion abnormalities. Doppler parameters are consistent with    abnormal left ventricular relaxation (grade 1 diastolic    dysfunction).  - Aortic valve: Trileaflet; mildly calcified leaflets. Left    coronary cusp mobility was mildly restricted.  - Mitral valve: Heavily calcified annulus. There was trivial    regurgitation.  - Left atrium: The atrium was mildly dilated.  - Right atrium: Central venous pressure (est): 3 mm Hg.  - Atrial septum: No defect or patent foramen ovale was identified.  - Tricuspid valve: There was trivial regurgitation.  - Pulmonary arteries: PA peak pressure: 25 mm Hg (S).  - Pericardium, extracardiac: There was no pericardial effusion.    )         Anesthesia Quick Evaluation

## 2022-11-17 NOTE — Progress Notes (Signed)
Anesthesia Chart Review:  72 year old female with pertinent history including GERD, HTN, sleep apnea, CKD Stage II, DM II, CAD (NSTEMI, s/p cutting balloon atherectomy/DES [facility] 2004).  Last heart cath November 2016 showed widely patent RCA stent with otherwise no significant disease in the LAD and left circumflex.  Echo in October 2016 showed EF 65 to 70%, grade 1 DD, no significant valvular abnormalities.  Pt had nuclear stress test ordered by PCP in June of 2022 for preop eval. Test done 12/28/20 at [facility] showed no reversible ischemia or infarction, EF 65%, low risk. Pt did subsequently undergo L#-4 TLIF 01/31/21 and  R TKA 12/16/21 without complication.   Patient states she has not seen cardiology since 2016.  Chronic medical conditions are followed by her PCP Dr. [facility] at [facility].  Reviewed last PCP note 07/13/2022, primary concern at that time was balance issues and difficulty with manual dexterity of fingers.  She was ultimately found to have severe cervical spine stenosis.  C-spine MR 09/29/2022 severe cervical spine stenosis at C3-4 and C4-5 with T2 hyperintense signal abnormality in the cervical spinal cord at the C3-C4 level, favored to represent myelomalacia.  Various prior notes in epic have mentioned the patient is s/p aortic valve replacement.  Patient denies this; states she has never had any cardiac intervention other than stent in 2004.  Cardiology notes from 2016 did not mention any history of valve replacement.  PCP notes also did not mention history of aortic valve replacement.  Echocardiogram does not mention history of aortic valve replacement. Clearly I do not think she has had an AVR; this appears to have been a mistake in charting.   NIDDM2, well controlled, A1c 6.1 on preop labs.   Preop labs reviewed, creatinine mildly elevated 1.18 c/w hx of CKD II, otherwise unremarkable.   EKG 11/14/22: Sinus bradycardia. Rate 57. Left ventricular hypertrophy  with repolarization abnormality ( R in aVL , Cornell product )  Nuclear Stress 12/28/2020: IMPRESSION: 1. No reversible ischemia or infarction.  2. Normal left ventricular wall motion.  3. Left ventricular ejection fraction 65%  4. Non invasive risk stratification*: Low   Cardiac Cath 04/28/2015 1. Widely patent RCA stent with no significant obstruction in that vessel. 2. Widely patent left main, LAD, and left circumflex 3. Normal LV function by echo 4. Normal LVEDP   Suspect noncardiac symptoms.   Echo 04/01/2015 Study Conclusions   - Left ventricle: The cavity size was normal. Wall thickness was    increased in a pattern of mild LVH. Systolic function was    vigorous. The estimated ejection fraction was in the range of 65%    to 70%. Wall motion was normal; there were no regional wall    motion abnormalities. Doppler parameters are consistent with    abnormal left ventricular relaxation (grade 1 diastolic    dysfunction).  - Aortic valve: Trileaflet; mildly calcified leaflets. Left    coronary cusp mobility was mildly restricted.  - Mitral valve: Heavily calcified annulus. There was trivial    regurgitation.  - Left atrium: The atrium was mildly dilated.  - Right atrium: Central venous pressure (est): 3 mm Hg.  - Atrial septum: No defect or patent foramen ovale was identified.  - Tricuspid valve: There was trivial regurgitation.  - Pulmonary arteries: PA peak pressure: 25 mm Hg (S).  - Pericardium, extracardiac: There was no pericardial effusion.     Zannie Cove [facility] Short Stay Center/Anesthesiology Phone [phone] 11/17/2022 1:30  PM

## 2022-11-23 ENCOUNTER — Ambulatory Visit (HOSPITAL_BASED_OUTPATIENT_CLINIC_OR_DEPARTMENT_OTHER): Payer: Medicare HMO

## 2022-11-23 ENCOUNTER — Observation Stay (HOSPITAL_COMMUNITY)
Admission: RE | Admit: 2022-11-23 | Discharge: 2022-11-24 | Disposition: A | Discharge status: Home / Self Care | Payer: Medicare HMO | Attending: Neurological Surgery | Admitting: Neurological Surgery

## 2022-11-23 ENCOUNTER — Ambulatory Visit (HOSPITAL_COMMUNITY): Payer: Medicare HMO

## 2022-11-23 ENCOUNTER — Ambulatory Visit (HOSPITAL_COMMUNITY)
Admission: RE | Disposition: A | Discharge status: Home / Self Care | Payer: Self-pay | Source: Home / Self Care | Attending: Neurological Surgery

## 2022-11-23 ENCOUNTER — Other Ambulatory Visit: Payer: Self-pay

## 2022-11-23 ENCOUNTER — Ambulatory Visit (HOSPITAL_COMMUNITY): Payer: Medicare HMO | Admitting: Physician Assistant

## 2022-11-23 DIAGNOSIS — I2510 Atherosclerotic heart disease of native coronary artery without angina pectoris: Secondary | ICD-10-CM | POA: Insufficient documentation

## 2022-11-23 DIAGNOSIS — N183 Chronic kidney disease, stage 3 unspecified: Secondary | ICD-10-CM | POA: Diagnosis not present

## 2022-11-23 DIAGNOSIS — M5001 Cervical disc disorder with myelopathy,  high cervical region: Secondary | ICD-10-CM | POA: Diagnosis not present

## 2022-11-23 DIAGNOSIS — Z981 Arthrodesis status: Secondary | ICD-10-CM | POA: Diagnosis not present

## 2022-11-23 DIAGNOSIS — G9529 Other cord compression: Secondary | ICD-10-CM | POA: Insufficient documentation

## 2022-11-23 DIAGNOSIS — Z7984 Long term (current) use of oral hypoglycemic drugs: Secondary | ICD-10-CM | POA: Insufficient documentation

## 2022-11-23 DIAGNOSIS — Z79899 Other long term (current) drug therapy: Secondary | ICD-10-CM | POA: Diagnosis not present

## 2022-11-23 DIAGNOSIS — Z7982 Long term (current) use of aspirin: Secondary | ICD-10-CM | POA: Diagnosis not present

## 2022-11-23 DIAGNOSIS — Z96651 Presence of right artificial knee joint: Secondary | ICD-10-CM | POA: Insufficient documentation

## 2022-11-23 DIAGNOSIS — E1122 Type 2 diabetes mellitus with diabetic chronic kidney disease: Secondary | ICD-10-CM | POA: Insufficient documentation

## 2022-11-23 DIAGNOSIS — I129 Hypertensive chronic kidney disease with stage 1 through stage 4 chronic kidney disease, or unspecified chronic kidney disease: Secondary | ICD-10-CM | POA: Diagnosis not present

## 2022-11-23 DIAGNOSIS — M4712 Other spondylosis with myelopathy, cervical region: Secondary | ICD-10-CM

## 2022-11-23 DIAGNOSIS — M4322 Fusion of spine, cervical region: Secondary | ICD-10-CM | POA: Diagnosis not present

## 2022-11-23 DIAGNOSIS — I119 Hypertensive heart disease without heart failure: Secondary | ICD-10-CM | POA: Diagnosis not present

## 2022-11-23 DIAGNOSIS — M4802 Spinal stenosis, cervical region: Secondary | ICD-10-CM | POA: Diagnosis not present

## 2022-11-23 DIAGNOSIS — N189 Chronic kidney disease, unspecified: Secondary | ICD-10-CM | POA: Diagnosis not present

## 2022-11-23 DIAGNOSIS — E119 Type 2 diabetes mellitus without complications: Secondary | ICD-10-CM

## 2022-11-23 HISTORY — PX: ANTERIOR CERVICAL DECOMP/DISCECTOMY FUSION: SHX1161

## 2022-11-23 LAB — GLUCOSE, CAPILLARY
Glucose-Capillary: 107 mg/dL — ABNORMAL HIGH (ref 70–99)
Glucose-Capillary: 96 mg/dL (ref 70–99)
Glucose-Capillary: 129 mg/dL — ABNORMAL HIGH (ref 70–99)
Glucose-Capillary: 137 mg/dL — ABNORMAL HIGH (ref 70–99)
Glucose-Capillary: 176 mg/dL — ABNORMAL HIGH (ref 70–99)

## 2022-11-23 SURGERY — ANTERIOR CERVICAL DECOMPRESSION/DISCECTOMY FUSION 2 LEVELS
Anesthesia: General | Site: Spine Cervical

## 2022-11-23 MED ORDER — HYDRALAZINE HCL 20 MG/ML IJ SOLN
INTRAMUSCULAR | Status: AC
  Filled 2022-11-23: qty 1

## 2022-11-23 MED ORDER — SODIUM CHLORIDE 0.9% FLUSH: 3.0000 mL | Freq: Two times a day (BID) | INTRAVENOUS | Status: DC

## 2022-11-23 MED ORDER — FENTANYL CITRATE (PF) 250 MCG/5ML IJ SOLN
INTRAMUSCULAR | Status: AC
  Filled 2022-11-23: qty 5

## 2022-11-23 MED ORDER — FAMOTIDINE 20 MG PO TABS
20.0000 mg | ORAL_TABLET | Freq: Every day | ORAL | Status: DC | PRN
  Administered 2022-11-24: 20 mg via ORAL
  Filled 2022-11-23: qty 1

## 2022-11-23 MED ORDER — HYDROCODONE-ACETAMINOPHEN 5-325 MG PO TABS
2.0000 | ORAL_TABLET | ORAL | Status: DC | PRN
  Administered 2022-11-23 – 2022-11-24 (×4): 2 via ORAL
  Filled 2022-11-23 (×4): qty 2

## 2022-11-23 MED ORDER — ACETAMINOPHEN 10 MG/ML IV SOLN
INTRAVENOUS | Status: DC | PRN
  Administered 2022-11-23: 1000 mg via INTRAVENOUS

## 2022-11-23 MED ORDER — METHOCARBAMOL 1000 MG/10ML IJ SOLN: 500.0000 mg | Freq: Four times a day (QID) | INTRAVENOUS | Status: DC | PRN

## 2022-11-23 MED ORDER — 0.9 % SODIUM CHLORIDE (POUR BTL) OPTIME
TOPICAL | Status: DC | PRN
  Administered 2022-11-23: 1000 mL

## 2022-11-23 MED ORDER — PROPOFOL 1000 MG/100ML IV EMUL
INTRAVENOUS | Status: AC
  Filled 2022-11-23: qty 200

## 2022-11-23 MED ORDER — PHENYLEPHRINE HCL-NACL 20-0.9 MG/250ML-% IV SOLN
INTRAVENOUS | Status: AC
  Filled 2022-11-23: qty 250

## 2022-11-23 MED ORDER — KETAMINE HCL 50 MG/5ML IJ SOSY
PREFILLED_SYRINGE | INTRAMUSCULAR | Status: AC
  Filled 2022-11-23: qty 5

## 2022-11-23 MED ORDER — ONDANSETRON HCL 4 MG/2ML IJ SOLN: 4.0000 mg | Freq: Four times a day (QID) | INTRAMUSCULAR | Status: DC | PRN

## 2022-11-23 MED ORDER — ATORVASTATIN CALCIUM 40 MG PO TABS
40.0000 mg | ORAL_TABLET | Freq: Every day | ORAL | Status: DC
  Administered 2022-11-24: 40 mg via ORAL
  Filled 2022-11-23: qty 1

## 2022-11-23 MED ORDER — LIDOCAINE 2% (20 MG/ML) 5 ML SYRINGE
INTRAMUSCULAR | Status: AC
  Filled 2022-11-23: qty 5

## 2022-11-23 MED ORDER — PHENOL 1.4 % MT LIQD: 1.0000 | OROMUCOSAL | Status: DC | PRN

## 2022-11-23 MED ORDER — METHOCARBAMOL 500 MG PO TABS
500.0000 mg | ORAL_TABLET | Freq: Four times a day (QID) | ORAL | Status: DC | PRN
  Administered 2022-11-23 – 2022-11-24 (×3): 500 mg via ORAL
  Filled 2022-11-23 (×3): qty 1

## 2022-11-23 MED ORDER — FENTANYL CITRATE (PF) 100 MCG/2ML IJ SOLN
25.0000 ug | INTRAMUSCULAR | Status: DC | PRN
  Administered 2022-11-23 (×2): 25 ug via INTRAVENOUS

## 2022-11-23 MED ORDER — ONDANSETRON HCL 4 MG/2ML IJ SOLN: 4.0000 mg | Freq: Once | INTRAMUSCULAR | Status: DC | PRN

## 2022-11-23 MED ORDER — CHLORHEXIDINE GLUCONATE CLOTH 2 % EX PADS: 6.0000 | MEDICATED_PAD | Freq: Once | CUTANEOUS | Status: DC

## 2022-11-23 MED ORDER — FENTANYL CITRATE (PF) 250 MCG/5ML IJ SOLN
INTRAMUSCULAR | Status: DC | PRN
  Administered 2022-11-23: 100 ug via INTRAVENOUS
  Administered 2022-11-23 (×2): 50 ug via INTRAVENOUS

## 2022-11-23 MED ORDER — AMISULPRIDE (ANTIEMETIC) 5 MG/2ML IV SOLN: 10.0000 mg | Freq: Once | INTRAVENOUS | Status: DC | PRN

## 2022-11-23 MED ORDER — KETOROLAC TROMETHAMINE 15 MG/ML IJ SOLN
7.5000 mg | Freq: Four times a day (QID) | INTRAMUSCULAR | Status: DC
  Administered 2022-11-23 – 2022-11-24 (×3): 7.5 mg via INTRAVENOUS
  Filled 2022-11-23 (×3): qty 1

## 2022-11-23 MED ORDER — GABAPENTIN 400 MG PO CAPS
800.0000 mg | ORAL_CAPSULE | Freq: Three times a day (TID) | ORAL | Status: DC
  Administered 2022-11-23 – 2022-11-24 (×2): 800 mg via ORAL
  Filled 2022-11-23 (×2): qty 2

## 2022-11-23 MED ORDER — SODIUM CHLORIDE 0.9 % IV SOLN: 250.0000 mL | INTRAVENOUS | Status: DC

## 2022-11-23 MED ORDER — DEXAMETHASONE SODIUM PHOSPHATE 10 MG/ML IJ SOLN
INTRAMUSCULAR | Status: DC | PRN
  Administered 2022-11-23: 10 mg via INTRAVENOUS

## 2022-11-23 MED ORDER — ONDANSETRON HCL 4 MG/2ML IJ SOLN
INTRAMUSCULAR | Status: AC
  Filled 2022-11-23: qty 2

## 2022-11-23 MED ORDER — HYDRALAZINE HCL 20 MG/ML IJ SOLN
INTRAMUSCULAR | Status: DC | PRN
  Administered 2022-11-23: 5 mg via INTRAVENOUS

## 2022-11-23 MED ORDER — LIDOCAINE 2% (20 MG/ML) 5 ML SYRINGE
INTRAMUSCULAR | Status: DC | PRN
  Administered 2022-11-23: 100 mg via INTRAVENOUS

## 2022-11-23 MED ORDER — ACETAMINOPHEN 650 MG RE SUPP: 650.0000 mg | RECTAL | Status: DC | PRN

## 2022-11-23 MED ORDER — PROPOFOL 500 MG/50ML IV EMUL
INTRAVENOUS | Status: DC | PRN
  Administered 2022-11-23: 25 ug/kg/min via INTRAVENOUS

## 2022-11-23 MED ORDER — CHLORHEXIDINE GLUCONATE 0.12 % MT SOLN
15.0000 mL | Freq: Once | OROMUCOSAL | Status: AC
  Administered 2022-11-23: 15 mL via OROMUCOSAL
  Filled 2022-11-23: qty 15

## 2022-11-23 MED ORDER — METFORMIN HCL 500 MG PO TABS
500.0000 mg | ORAL_TABLET | Freq: Every day | ORAL | Status: DC
  Administered 2022-11-24: 500 mg via ORAL
  Filled 2022-11-23: qty 1

## 2022-11-23 MED ORDER — PROPOFOL 10 MG/ML IV BOLUS
INTRAVENOUS | Status: AC
  Filled 2022-11-23: qty 20

## 2022-11-23 MED ORDER — HYDROCODONE-ACETAMINOPHEN 5-325 MG PO TABS: 1.0000 | ORAL_TABLET | ORAL | Status: DC | PRN

## 2022-11-23 MED ORDER — THROMBIN 5000 UNITS EX SOLR
OROMUCOSAL | Status: DC | PRN
  Administered 2022-11-23: 5 mL via TOPICAL

## 2022-11-23 MED ORDER — ONDANSETRON HCL 4 MG/2ML IJ SOLN
INTRAMUSCULAR | Status: DC | PRN
  Administered 2022-11-23: 4 mg via INTRAVENOUS

## 2022-11-23 MED ORDER — THROMBIN 5000 UNITS EX SOLR
CUTANEOUS | Status: AC
  Filled 2022-11-23: qty 5000

## 2022-11-23 MED ORDER — EPHEDRINE SULFATE-NACL 50-0.9 MG/10ML-% IV SOSY
PREFILLED_SYRINGE | INTRAVENOUS | Status: DC | PRN
  Administered 2022-11-23: 10 mg via INTRAVENOUS

## 2022-11-23 MED ORDER — MENTHOL 3 MG MT LOZG: 1.0000 | LOZENGE | OROMUCOSAL | Status: DC | PRN

## 2022-11-23 MED ORDER — LACTATED RINGERS IV SOLN: INTRAVENOUS | Status: DC

## 2022-11-23 MED ORDER — THROMBIN (RECOMBINANT) 5000 UNITS EX SOLR
CUTANEOUS | Status: DC | PRN
  Administered 2022-11-23: 1 via TOPICAL

## 2022-11-23 MED ORDER — HYDROMORPHONE HCL 1 MG/ML IJ SOLN
0.5000 mg | INTRAMUSCULAR | Status: DC | PRN
  Administered 2022-11-23: 0.5 mg via INTRAVENOUS
  Filled 2022-11-23: qty 0.5

## 2022-11-23 MED ORDER — FLEET ENEMA 7-19 GM/118ML RE ENEM: 1.0000 | ENEMA | Freq: Once | RECTAL | Status: DC | PRN

## 2022-11-23 MED ORDER — DOCUSATE SODIUM 100 MG PO CAPS
100.0000 mg | ORAL_CAPSULE | Freq: Two times a day (BID) | ORAL | Status: DC
  Administered 2022-11-23 – 2022-11-24 (×2): 100 mg via ORAL
  Filled 2022-11-23 (×2): qty 1

## 2022-11-23 MED ORDER — ACETAMINOPHEN 10 MG/ML IV SOLN: 1000.0000 mg | Freq: Once | INTRAVENOUS | Status: DC | PRN

## 2022-11-23 MED ORDER — FENTANYL CITRATE (PF) 100 MCG/2ML IJ SOLN
INTRAMUSCULAR | Status: AC
  Filled 2022-11-23: qty 2

## 2022-11-23 MED ORDER — THROMBIN 5000 UNITS EX SOLR
CUTANEOUS | Status: AC
  Filled 2022-11-23: qty 15000

## 2022-11-23 MED ORDER — HYDROCHLOROTHIAZIDE 25 MG PO TABS
25.0000 mg | ORAL_TABLET | Freq: Every day | ORAL | Status: DC
  Administered 2022-11-24: 25 mg via ORAL
  Filled 2022-11-23: qty 1

## 2022-11-23 MED ORDER — LISINOPRIL 20 MG PO TABS
40.0000 mg | ORAL_TABLET | Freq: Every day | ORAL | Status: DC
  Administered 2022-11-23 – 2022-11-24 (×2): 40 mg via ORAL
  Filled 2022-11-23 (×2): qty 2

## 2022-11-23 MED ORDER — ROCURONIUM BROMIDE 10 MG/ML (PF) SYRINGE
PREFILLED_SYRINGE | INTRAVENOUS | Status: AC
  Filled 2022-11-23: qty 10

## 2022-11-23 MED ORDER — CEFAZOLIN SODIUM-DEXTROSE 2-4 GM/100ML-% IV SOLN
2.0000 g | Freq: Three times a day (TID) | INTRAVENOUS | Status: AC
  Administered 2022-11-23 – 2022-11-24 (×2): 2 g via INTRAVENOUS
  Filled 2022-11-23 (×2): qty 100

## 2022-11-23 MED ORDER — DEXAMETHASONE SODIUM PHOSPHATE 10 MG/ML IJ SOLN
INTRAMUSCULAR | Status: AC
  Filled 2022-11-23: qty 1

## 2022-11-23 MED ORDER — AMLODIPINE BESYLATE 10 MG PO TABS
10.0000 mg | ORAL_TABLET | Freq: Every day | ORAL | Status: DC
  Administered 2022-11-24: 10 mg via ORAL
  Filled 2022-11-23: qty 1

## 2022-11-23 MED ORDER — ROCURONIUM BROMIDE 10 MG/ML (PF) SYRINGE
PREFILLED_SYRINGE | INTRAVENOUS | Status: DC | PRN
  Administered 2022-11-23: 30 mg via INTRAVENOUS
  Administered 2022-11-23: 70 mg via INTRAVENOUS
  Administered 2022-11-23: 30 mg via INTRAVENOUS

## 2022-11-23 MED ORDER — SUGAMMADEX SODIUM 200 MG/2ML IV SOLN
INTRAVENOUS | Status: DC | PRN
  Administered 2022-11-23: 200 mg via INTRAVENOUS

## 2022-11-23 MED ORDER — LABETALOL HCL 5 MG/ML IV SOLN
5.0000 mg | INTRAVENOUS | Status: DC | PRN
  Administered 2022-11-23: 5 mg via INTRAVENOUS

## 2022-11-23 MED ORDER — ACETAMINOPHEN 325 MG PO TABS: 650.0000 mg | ORAL_TABLET | ORAL | Status: DC | PRN

## 2022-11-23 MED ORDER — POLYETHYLENE GLYCOL 3350 17 G PO PACK: 17.0000 g | PACK | Freq: Every day | ORAL | Status: DC | PRN

## 2022-11-23 MED ORDER — ASPIRIN 81 MG PO CHEW: 81.0000 mg | CHEWABLE_TABLET | Freq: Every day | ORAL | Status: DC

## 2022-11-23 MED ORDER — CEFAZOLIN SODIUM-DEXTROSE 2-4 GM/100ML-% IV SOLN
2.0000 g | INTRAVENOUS | Status: AC
  Administered 2022-11-23: 2 g via INTRAVENOUS
  Filled 2022-11-23: qty 100

## 2022-11-23 MED ORDER — HYDROMORPHONE HCL 1 MG/ML IJ SOLN: 0.2500 mg | INTRAMUSCULAR | Status: DC | PRN

## 2022-11-23 MED ORDER — LABETALOL HCL 5 MG/ML IV SOLN
INTRAVENOUS | Status: AC
  Filled 2022-11-23: qty 4

## 2022-11-23 MED ORDER — ORAL CARE MOUTH RINSE: 15.0000 mL | Freq: Once | OROMUCOSAL | Status: AC

## 2022-11-23 MED ORDER — PHENYLEPHRINE HCL-NACL 20-0.9 MG/250ML-% IV SOLN
INTRAVENOUS | Status: DC | PRN
  Administered 2022-11-23: 40 ug/min via INTRAVENOUS

## 2022-11-23 MED ORDER — ONDANSETRON HCL 4 MG PO TABS: 4.0000 mg | ORAL_TABLET | Freq: Four times a day (QID) | ORAL | Status: DC | PRN

## 2022-11-23 MED ORDER — MIDAZOLAM HCL 2 MG/2ML IJ SOLN
INTRAMUSCULAR | Status: DC | PRN
  Administered 2022-11-23: 1 mg via INTRAVENOUS

## 2022-11-23 MED ORDER — TRIAMCINOLONE ACETONIDE 40 MG/ML IJ SUSP
INTRAMUSCULAR | Status: AC
  Filled 2022-11-23: qty 5

## 2022-11-23 MED ORDER — ACETAMINOPHEN 10 MG/ML IV SOLN
INTRAVENOUS | Status: AC
  Filled 2022-11-23: qty 100

## 2022-11-23 MED ORDER — SODIUM CHLORIDE 0.9% FLUSH: 3.0000 mL | INTRAVENOUS | Status: DC | PRN

## 2022-11-23 MED ORDER — PROPOFOL 10 MG/ML IV BOLUS
INTRAVENOUS | Status: DC | PRN
  Administered 2022-11-23: 200 mg via INTRAVENOUS

## 2022-11-23 MED ORDER — MIDAZOLAM HCL 2 MG/2ML IJ SOLN
INTRAMUSCULAR | Status: AC
  Filled 2022-11-23: qty 2

## 2022-11-23 SURGICAL SUPPLY — 58 items
APL SKNCLS STERI-STRIP NONHPOA (GAUZE/BANDAGES/DRESSINGS)
BAG COUNTER SPONGE SURGICOUNT (BAG) ×1 IMPLANT
BAG SPNG CNTER NS LX DISP (BAG) ×1
BENZOIN TINCTURE PRP APPL 2/3 (GAUZE/BANDAGES/DRESSINGS) IMPLANT
BIT DRILL NEURO 2X3.1 SFT TUCH (MISCELLANEOUS) ×1 IMPLANT
BLADE CLIPPER SURG (BLADE) IMPLANT
BUR CARBIDE MATCH 3.0 (BURR) ×1 IMPLANT
CANISTER SUCT 3000ML PPV (MISCELLANEOUS) ×1 IMPLANT
COVER MAYO STAND STRL (DRAPES) ×1 IMPLANT
DRAPE C-ARM 42X72 X-RAY (DRAPES) ×1 IMPLANT
DRAPE HALF SHEET 40X57 (DRAPES) IMPLANT
DRAPE LAPAROTOMY 100X72X124 (DRAPES) ×1 IMPLANT
DRAPE MICROSCOPE SLANT 54X150 (MISCELLANEOUS) ×1 IMPLANT
DRILL NEURO 2X3.1 SOFT TOUCH (MISCELLANEOUS) ×1
DRSG OPSITE POSTOP 4X6 (GAUZE/BANDAGES/DRESSINGS) IMPLANT
DURAPREP 6ML APPLICATOR 50/CS (WOUND CARE) ×1 IMPLANT
ELECT COATED BLADE 2.86 ST (ELECTRODE) ×1 IMPLANT
ELECT REM PT RETURN 9FT ADLT (ELECTROSURGICAL) ×1
ELECTRODE REM PT RTRN 9FT ADLT (ELECTROSURGICAL) ×1 IMPLANT
EVACUATOR 1/8 PVC DRAIN (DRAIN) IMPLANT
GAUZE 4X4 16PLY ~~LOC~~+RFID DBL (SPONGE) IMPLANT
GLOVE BIO SURGEON STRL SZ7 (GLOVE) ×2 IMPLANT
GLOVE BIOGEL PI IND STRL 7.0 (GLOVE) ×1 IMPLANT
GLOVE BIOGEL PI IND STRL 7.5 (GLOVE) ×1 IMPLANT
GLOVE BIOGEL PI IND STRL 8 (GLOVE) ×1 IMPLANT
GLOVE ECLIPSE 8.0 STRL XLNG CF (GLOVE) ×2 IMPLANT
GLOVE EXAM NITRILE LRG STRL (GLOVE) IMPLANT
GLOVE EXAM NITRILE XL STR (GLOVE) IMPLANT
GLOVE EXAM NITRILE XS STR PU (GLOVE) IMPLANT
GOWN STRL REUS W/ TWL LRG LVL3 (GOWN DISPOSABLE) IMPLANT
GOWN STRL REUS W/ TWL XL LVL3 (GOWN DISPOSABLE) ×2 IMPLANT
GOWN STRL REUS W/TWL 2XL LVL3 (GOWN DISPOSABLE) IMPLANT
GOWN STRL REUS W/TWL LRG LVL3 (GOWN DISPOSABLE)
GOWN STRL REUS W/TWL XL LVL3 (GOWN DISPOSABLE) ×2
HEMOSTAT POWDER KIT SURGIFOAM (HEMOSTASIS) ×1 IMPLANT
KIT BASIN OR (CUSTOM PROCEDURE TRAY) ×1 IMPLANT
KIT TURNOVER KIT B (KITS) ×1 IMPLANT
NDL SPNL 18GX3.5 QUINCKE PK (NEEDLE) ×1 IMPLANT
NEEDLE SPNL 18GX3.5 QUINCKE PK (NEEDLE) ×1 IMPLANT
NS IRRIG 1000ML POUR BTL (IV SOLUTION) ×1 IMPLANT
PACK LAMINECTOMY NEURO (CUSTOM PROCEDURE TRAY) ×1 IMPLANT
PAD ARMBOARD 7.5X6 YLW CONV (MISCELLANEOUS) ×3 IMPLANT
PIN DISTRACTION 14MM (PIN) ×2 IMPLANT
PLATE CERV CRVD ZION 34 2L (Plate) IMPLANT
PUTTY BONE 100 VESUVIUS 2.5CC (Putty) IMPLANT
SCREW CERV ST FIXED 16 (Screw) IMPLANT
SCREW CERV ST VARIABLE 16 (Screw) IMPLANT
SOL ELECTROSURG ANTI STICK (MISCELLANEOUS) ×1
SOLUTION ELECTROSURG ANTI STCK (MISCELLANEOUS) ×1 IMPLANT
SPACER LORD CASC 14X12X7 7D (Spacer) IMPLANT
SPONGE INTESTINAL PEANUT (DISPOSABLE) ×1 IMPLANT
SPONGE SURGIFOAM ABS GEL SZ50 (HEMOSTASIS) IMPLANT
STAPLER VISISTAT 35W (STAPLE) IMPLANT
STRIP CLOSURE SKIN 1/2X4 (GAUZE/BANDAGES/DRESSINGS) ×1 IMPLANT
TAPE SURG TRANSPORE 1 IN (GAUZE/BANDAGES/DRESSINGS) ×1 IMPLANT
TOWEL GREEN STERILE (TOWEL DISPOSABLE) ×1 IMPLANT
TOWEL GREEN STERILE FF (TOWEL DISPOSABLE) ×1 IMPLANT
WATER STERILE IRR 1000ML POUR (IV SOLUTION) ×1 IMPLANT

## 2022-11-23 NOTE — Anesthesia Procedure Notes (Signed)
Procedure Name: Intubation Date/Time: 11/23/2022 1:03 PM  Performed by: Alwyn Ren, CRNAPre-anesthesia Checklist: Patient identified, Emergency Drugs available, Suction available and Patient being monitored Patient Re-evaluated:Patient Re-evaluated prior to induction Oxygen Delivery Method: Circle system utilized Preoxygenation: Pre-oxygenation with 100% oxygen Induction Type: IV induction Ventilation: Oral airway inserted - appropriate to patient size and Two handed mask ventilation required Laryngoscope Size: Glidescope and 3 Grade View: Grade I Tube type: Oral Tube size: 7.0 mm Number of attempts: 1 Airway Equipment and Method: Oral airway, Rigid stylet and Video-laryngoscopy Placement Confirmation: ETT inserted through vocal cords under direct vision, positive ETCO2 and breath sounds checked- equal and bilateral Secured at: 22 cm Tube secured with: Tape Dental Injury: Teeth and Oropharynx as per pre-operative assessment  Difficulty Due To: Difficulty was anticipated

## 2022-11-23 NOTE — Transfer of Care (Signed)
Immediate Anesthesia Transfer of Care Note  Patient: [name] [name]  Procedure(s) Performed: Anterior Cervical Decompression Fusion Cervical three-four, Cervical four-five (Spine Cervical)  Patient Location: PACU  Anesthesia Type:General  Level of Consciousness: sedated  Airway & Oxygen Therapy: Patient Spontanous Breathing and Patient connected to face mask oxygen  Post-op Assessment: Report given to RN and Post -op Vital signs reviewed and stable  Post vital signs: Reviewed and stable  Last Vitals:  Vitals Value Taken Time  BP    Temp    Pulse    Resp    SpO2      Last Pain:  Vitals:   11/23/22 1116  TempSrc:   PainSc: 7       Patients Stated Pain Goal: 2 (11/23/22 1116)  Complications:  Encounter Notable Events  Notable Event Outcome Phase Comment  Difficult to intubate - expected  Intraprocedure Filed from anesthesia note documentation.

## 2022-11-23 NOTE — H&P (Signed)
Providing Compassionate, Quality Care - Together  NEUROSURGERY HISTORY & PHYSICAL   [name] [name] is an 72 y.o. female.   Chief Complaint: Balance difficulty HPI: This is a 72 year old female with a history of MI, GERD, hypertension, hyperlipidemia with complaints of worsening balance over the past 1 to 2 years leading to multiple falls.  She also complains of numbness and tingling in her bilateral hands and dropping objects and difficulty with fine motor movement.  MRI of the cervical spine revealed severe canal stenosis due to disc osteophyte complex at C3-4 and C4-5 with cord signal change consistent with myelopathy.  She currently utilizes a cane to ambulate.  She has a history of a C5-6 anterior cervical fusion many many years ago.  Past Medical History:  Diagnosis Date   Arthritis    Carpal tunnel syndrome    Chronic kidney disease (CKD), stage II (mild)    Constipation    Coronary artery disease    NSTEMI 2004, DES to ostial RCA   DDD (degenerative disc disease)    Essential hypertension    GERD (gastroesophageal reflux disease)    Hyperlipidemia    IBS (irritable bowel syndrome)    Low back pain    Myocardial infarction [facility]) 2004   one stent placed   Neuropathy    bilateral hands r/t cervical spine issues   Postmenopausal    Pulmonary nodule    Sleep apnea    "waiting on machine to come".   Type 2 diabetes mellitus (HCC)    Vertigo     Past Surgical History:  Procedure Laterality Date   ABDOMINAL HYSTERECTOMY     partial. Pt was in her 43s.   CARDIAC CATHETERIZATION N/A 04/28/2015   Procedure: Left Heart Cath and Coronary Angiography;  Surgeon: Tonny Bollman, MD;  Location: [facility] INVASIVE CV LAB;  Service: Cardiovascular;  Laterality: N/A;   CATARACT EXTRACTION Bilateral    CATARACT EXTRACTION W/PHACO Right 07/19/2020   Procedure: CATARACT EXTRACTION PHACO AND INTRAOCULAR LENS PLACEMENT RIGHT EYE;  Surgeon: Fabio Pierce, MD;  Location: [facility] ORS;  Service:  Ophthalmology;  Laterality: Right;  right CDE=12.56   CATARACT EXTRACTION W/PHACO Left 08/09/2020   Procedure: CATARACT EXTRACTION PHACO AND INTRAOCULAR LENS PLACEMENT (IOC);  Surgeon: Fabio Pierce, MD;  Location: [facility] ORS;  Service: Ophthalmology;  Laterality: Left;  CDE: 6.66   Cervical disectomy and fusion     Cholestectomy     COLONOSCOPY N/A 05/07/2019   Procedure: COLONOSCOPY;  Surgeon: Corbin Ade, MD;  Location: [facility] ENDO SUITE;  Service: Endoscopy;  Laterality: N/A;  12:00   Excision of ovarian cyst     L4-5 with pedicle screws and rods     x2   MULTIPLE TOOTH EXTRACTIONS     Right shoulder RTC     TOTAL KNEE ARTHROPLASTY Right 12/16/2021   Procedure: RIGHT TOTAL KNEE ARTHROPLASTY;  Surgeon: Kathryne Hitch, MD;  Location: [facility] ORS;  Service: Orthopedics;  Laterality: Right;    Family History  Problem Relation Age of Onset   Stroke Mother    Diabetes Mellitus II Mother    Kidney disease Father    Hypertension Brother    Diabetes Mellitus II Brother    Diabetes Mellitus II Sister    Social History:  reports that she has never smoked. She has never used smokeless tobacco. She reports that she does not drink alcohol and does not use drugs.  Allergies: No Known Allergies  Medications Prior to Admission  Medication Sig Dispense  Refill   amLODipine (NORVASC) 10 MG tablet Take 10 mg by mouth daily.     aspirin 81 MG chewable tablet Chew 1 tablet (81 mg total) by mouth 2 (two) times daily. (Patient taking differently: Chew 81 mg by mouth daily.) 30 tablet 0   atorvastatin (LIPITOR) 40 MG tablet Take 40 mg by mouth daily.     Cholecalciferol (DIALYVITE VITAMIN D 5000) 125 MCG (5000 UT) capsule Take 5,000 Units by mouth daily.     diclofenac Sodium (VOLTAREN) 1 % GEL Apply 1 Application topically 2 (two) times daily.     famotidine (PEPCID) 20 MG tablet Take 20 mg by mouth daily as needed for heartburn or indigestion.     gabapentin (NEURONTIN) 800 MG tablet Take 800 mg  by mouth 3 (three) times daily.     hydrochlorothiazide (HYDRODIURIL) 25 MG tablet Take 25 mg by mouth daily.     lisinopril (PRINIVIL,ZESTRIL) 40 MG tablet Take 40 mg by mouth daily.     metFORMIN (GLUCOPHAGE) 500 MG tablet Take 500 mg by mouth daily.     Polyethyl Glycol-Propyl Glycol (LUBRICATING EYE DROPS OP) Place 1 drop into both eyes daily as needed (dry eyes).     acetaminophen (TYLENOL) 325 MG tablet Take 650 mg by mouth every 6 (six) hours as needed for moderate pain.      Results for orders placed or performed during the hospital encounter of 11/23/22 (from the past 48 hour(s))  Glucose, capillary     Status: Abnormal   Collection Time: 11/23/22 10:43 AM  Result Value Ref Range   Glucose-Capillary 107 (H) 70 - 99 mg/dL    Comment: Glucose reference range applies only to samples taken after fasting for at least 8 hours.   No results found.  ROS All pertinent positives and negatives are listed HPI above  Blood pressure (!) 194/96, pulse 71, temperature 98.9 F (37.2 C), temperature source Oral, resp. rate 18, height 5\' 7"  (1.702 m), weight 104.3 kg, SpO2 98 %. Physical Exam  Awake alert oriented x 3, no acute distress PERRLA Cranial nerves II through XII intact Bilateral upper extremities and lower extremities 4-4+/5 throughout Deep tendon reflexes 3/4 throughout bilateral upper and lower extremities Positive Hoffmann bilaterally  Assessment/Plan 72 year old female with  C3-5 cervical spondylotic myelopathy with cord signal change  -OR today for ACDF C3-5.  We discussed all risks, benefits and expected outcomes as well as alternatives to treatment.  Informed consent was obtained and witnessed.  Thank you for allowing me to participate in this patient's care.  Please do not hesitate to call with questions or concerns.   Monia Pouch, DO Neurosurgeon [facility] Cell: [phone]

## 2022-11-23 NOTE — Op Note (Signed)
Providing Compassionate, Quality Care - Together  Date of service: 11/23/2022  PREOP DIAGNOSIS: Cervical spondylosis with stenosis and myelopathy, C3-4, C4-5  POSTOP DIAGNOSIS: Same  PROCEDURE: 1. Arthrodesis C3-4, C4-5, anterior interbody technique  2. Placement of intervertebral biomechanical device C3-4, C4-5: K2 M [place] titanium interbody 7 mm at both levels 3. Placement of anterior instrumentation consisting of interbody plate and screws -Zion 34 mm plate, 4.0 x 16 mm variable screws bilaterally at C3, C4, 4.0 x 16 mm thick screws bilaterally at C5 4. Discectomy at C3-4, C4-5 for decompression of spinal cord and exiting nerve roots  5. Use of morselized bone allograft  6. Use of intraoperative microscope 7.  Use of autograft, same incision  SURGEON: Dr. Monia Pouch, DO  ASSISTANT: Paticia Stack, PA  ANESTHESIA: General Endotracheal  EBL: 50 cc  SPECIMENS: None  DRAINS: None  COMPLICATIONS: None immediate  CONDITION: Hemodynamically stable to PACU  HISTORY: [name] [name] is a 72 y.o. y.o. female who initially presented to the outpatient clinic with signs and symptoms consistent with cervical myelopathy.  She had progressive worsening gait imbalance, numbness tingling and fine motor difficulties. MRI demonstrated severe stenosis at C3-4 due to disc osteophyte complex with cord signal change, moderate to severe stenosis at C4-5 due to disc osteophyte complex. Treatment options were discussed including observation, surgical intervention in the form of an ACDF C3-5.  Due to her progressive myelopathic symptoms recommended surgical intervention. After all questions were answered, informed consent was obtained and witnessed.  PROCEDURE IN DETAIL: The patient was brought to the operating room and transferred to the operative table. After induction of general anesthesia, the patient was positioned on the operative table in the supine position with all pressure points  meticulously padded. The skin of the neck was then prepped and draped in the usual sterile fashion.  Physician driven timeout was performed.  After timeout was conducted, skin incision was then made sharply with a 10 blade and Bovie electrocautery was used to dissect the subcutaneous tissue until the platysma was identified. The platysma was then divided and undermined. The sternocleidomastoid muscle was then identified and, utilizing natural fascial planes in the neck, the prevertebral fascia was identified and the carotid sheath was retracted laterally and the trachea and esophagus retracted medially. Again using fluoroscopy, the correct disc space was identified. Bovie electrocautery was used to dissect in the subperiosteal plane and elevate the bilateral longus coli muscles. Self-retaining retractors were then placed under the longus coli muscles bilaterally. At this point, the microscope was draped and brought into the field, and the remainder of the case was done under the microscope using microdissecting technique.  ACDF C3-4: Distraction pins were placed in midline above and below the disc space.  The disc  space was placed in distraction.  The disc space was incised sharply and rongeurs were use to initially complete a discectomy. The high-speed drill was then used to complete discectomy until the posterior annulus was identified and removed and the posterior longitudinal ligament was identified. Using microcurettes, the PLL was elevated, and Kerrison rongeurs were used to remove the posterior longitudinal ligament and the ventral thecal sac was identified. Using a combination of curettes and ronguers, complete decompression of the thecal sac and exiting nerve roots at this level was completed, and verified using micro-nerve hook. The disc space was taken out of distraction.  Epidural hemostasis was achieved with Surgifoam.  Having completed our decompression, attention was turned to placement of the  intervertebral device.  Trial spacers were used to select a 7mm graft. This graft was then filled with morcellized allograft, and autograft and inserted under live fluoroscopy.  ACDF C4-5: Distraction pins were placed in midline above and below the disc space.  The disc  space was placed in distraction.  The disc space was incised sharply and rongeurs were use to initially complete a discectomy. The high-speed drill was then used to complete discectomy until the posterior annulus was identified and removed and the posterior longitudinal ligament was identified. Using microcurettes, the PLL was elevated, and Kerrison rongeurs were used to remove the posterior longitudinal ligament and the ventral thecal sac was identified. Using a combination of curettes and ronguers, complete decompression of the thecal sac and exiting nerve roots at this level was completed, and verified using micro-nerve hook. The disc space was taken out of distraction.  Epidural hemostasis was achieved with Surgifoam.  Having completed our decompression, attention was turned to placement of the intervertebral device. Trial spacers were used to select a 7mm graft. This graft was then filled with morcellized allograft, and autograft and inserted under live fluoroscopy.  After placement of the intervertebral device, the above anterior cervical plate was selected, and placed across the interspace. Using a high-speed drill, the cortex of the cervical vertebral bodies was punctured, and screws inserted in the C3, C4, C5 bodies. Final fluoroscopic images in AP and lateral projections were taken to confirm good hardware placement.  The plate was final tightened to the manufacturer's recommendation and the screws were locked in place.  At this point, after all counts were verified to be correct, meticulous hemostasis was secured using a combination of bipolar electrocautery and passive hemostatics.  Skin was closed with staples.  Sterile dressing  was applied.  The patient tolerated the procedure well and was extubated in the room and taken to the postanesthesia care unit in stable condition.

## 2022-11-23 NOTE — Progress Notes (Signed)
2Orthopedic Tech Progress Note Patient Details:  [name] [name] 01/31/1951 [id number]  Ortho Devices Type of Ortho Device: Aspen cervical collar Ortho Device/Splint Location: given to RN for 3C11 at nurse's station Ortho Device/Splint Interventions: Ordered      Docia Furl 11/23/2022, 6:14 PM

## 2022-11-24 ENCOUNTER — Encounter (HOSPITAL_COMMUNITY): Payer: Self-pay | Admitting: Neurological Surgery

## 2022-11-24 DIAGNOSIS — M4712 Other spondylosis with myelopathy, cervical region: Secondary | ICD-10-CM | POA: Diagnosis not present

## 2022-11-24 DIAGNOSIS — I2510 Atherosclerotic heart disease of native coronary artery without angina pectoris: Secondary | ICD-10-CM | POA: Diagnosis not present

## 2022-11-24 DIAGNOSIS — Z96651 Presence of right artificial knee joint: Secondary | ICD-10-CM | POA: Diagnosis not present

## 2022-11-24 DIAGNOSIS — G9529 Other cord compression: Secondary | ICD-10-CM | POA: Diagnosis not present

## 2022-11-24 DIAGNOSIS — M4802 Spinal stenosis, cervical region: Secondary | ICD-10-CM | POA: Diagnosis not present

## 2022-11-24 DIAGNOSIS — Z79899 Other long term (current) drug therapy: Secondary | ICD-10-CM | POA: Diagnosis not present

## 2022-11-24 DIAGNOSIS — Z7982 Long term (current) use of aspirin: Secondary | ICD-10-CM | POA: Diagnosis not present

## 2022-11-24 DIAGNOSIS — N183 Chronic kidney disease, stage 3 unspecified: Secondary | ICD-10-CM | POA: Diagnosis not present

## 2022-11-24 DIAGNOSIS — I129 Hypertensive chronic kidney disease with stage 1 through stage 4 chronic kidney disease, or unspecified chronic kidney disease: Secondary | ICD-10-CM | POA: Diagnosis not present

## 2022-11-24 LAB — GLUCOSE, CAPILLARY
Glucose-Capillary: 108 mg/dL — ABNORMAL HIGH (ref 70–99)
Glucose-Capillary: 113 mg/dL — ABNORMAL HIGH (ref 70–99)

## 2022-11-24 MED ORDER — HYDROCODONE-ACETAMINOPHEN 5-325 MG PO TABS: 1.0000 | ORAL_TABLET | Freq: Four times a day (QID) | ORAL | 0 refills | Status: DC | PRN

## 2022-11-24 MED ORDER — METHOCARBAMOL 500 MG PO TABS: 500.0000 mg | ORAL_TABLET | Freq: Three times a day (TID) | ORAL | 1 refills | Status: DC | PRN

## 2022-11-24 MED FILL — Thrombin For Soln 5000 Unit: CUTANEOUS | Qty: 2 | Status: AC

## 2022-11-24 NOTE — Evaluation (Signed)
Occupational Therapy Evaluation Patient Details Name: [name] [name] MRN: [id number] DOB: Jun 04, 1951 Today's Date: 11/24/2022   History of Present Illness 72 yo M s/p ACDF.  PMH includes: R TKR, CKD, CAD, DDD, DM   Clinical Impression   Patient admitted for the diagnosis above.  PTA she lives alone at home, but has a son that can assist with community mobility and groceries, otherwise she is close to her baseline for mobility and ADL completion.  Education completed and documented in education section of chart, precautions reviewed with good teach back, ADL completed and mobility in the hall at [facility] level.  No further needs in the acute setting and no post acute rehab indicated.        Recommendations for follow up therapy are one component of a multi-disciplinary discharge planning process, led by the attending physician.  Recommendations may be updated based on patient status, additional functional criteria and insurance authorization.   Assistance Recommended at Discharge Set up Supervision/Assistance  Patient can return home with the following Assist for transportation;Assistance with cooking/housework    Functional Status Assessment  Patient has had a recent decline in their functional status and demonstrates the ability to make significant improvements in function in a reasonable and predictable amount of time.  Equipment Recommendations  None recommended by OT    Recommendations for Other Services       Precautions / Restrictions Precautions Precautions: Back Precaution Booklet Issued: Yes (comment) Precaution Comments: reviewed and verbalized understanding Restrictions Weight Bearing Restrictions: No      Mobility Bed Mobility Overal bed mobility: Modified Independent               Patient Response: Cooperative  Transfers Overall transfer level: Modified independent Equipment used: Rolling walker (2 wheels)                      Balance Overall  balance assessment: Mild deficits observed, not formally tested                                         ADL either performed or assessed with clinical judgement   ADL                       Lower Body Dressing: Minimal assistance;Sit to/from stand                       Vision Patient Visual Report: No change from baseline       Perception     Praxis      Pertinent Vitals/Pain Pain Assessment Pain Assessment: Faces Faces Pain Scale: Hurts a little bit Pain Location: Neck Pain Descriptors / Indicators: Tender Pain Intervention(s): Monitored during session     Hand Dominance Right   Extremity/Trunk Assessment Upper Extremity Assessment Upper Extremity Assessment: Overall WFL for tasks assessed   Lower Extremity Assessment Lower Extremity Assessment: Overall WFL for tasks assessed   Cervical / Trunk Assessment Cervical / Trunk Assessment: Neck Surgery   Communication Communication Communication: No difficulties   Cognition Arousal/Alertness: Awake/alert Behavior During Therapy: WFL for tasks assessed/performed Overall Cognitive Status: Within Functional Limits for tasks assessed  General Comments       Exercises     Shoulder Instructions      Home Living Family/patient expects to be discharged to:: Private residence Living Arrangements: Alone Available Help at Discharge: Family;Available PRN/intermittently Type of Home: House Home Access: Stairs to enter [employer] of Steps: 1   Home Layout: One level     Bathroom Shower/Tub: Tub/shower unit;Curtain   Bathroom Toilet: Handicapped height Bathroom Accessibility: Yes How Accessible: Accessible via walker Home Equipment: Rolling Walker (2 wheels);Cane - single point;Toilet riser          Prior Functioning/Environment Prior Level of Function : Independent/Modified Independent;Driving                         OT Problem List: Pain      OT Treatment/Interventions:      OT Goals(Current goals can be found in the care plan section) Acute Rehab OT Goals Patient Stated Goal: Return home OT Goal Formulation: With patient Time For Goal Achievement: 12/01/22 Potential to Achieve Goals: Good  OT Frequency:      Co-evaluation              AM-PAC OT "6 Clicks" Daily Activity     Outcome Measure Help from another person eating meals?: None Help from another person taking care of personal grooming?: None Help from another person toileting, which includes using toliet, bedpan, or urinal?: None Help from another person bathing (including washing, rinsing, drying)?: A Little Help from another person to put on and taking off regular upper body clothing?: None Help from another person to put on and taking off regular lower body clothing?: A Little 6 Click Score: 22   End of Session Equipment Utilized During Treatment: Rolling walker (2 wheels) Nurse Communication: Mobility status  Activity Tolerance: Patient tolerated treatment well Patient left: in bed;with call bell/phone within reach  OT Visit Diagnosis: Unsteadiness on feet (R26.81)                Time: 1610-9604 OT Time Calculation (min): 32 min Charges:  OT General Charges $OT Visit: 1 Visit OT Evaluation $OT Eval Moderate Complexity: 1 Mod OT Treatments $Self Care/Home Management : 8-22 mins  11/24/2022  RP, OTR/L  Acute Rehabilitation Services  Office:  [phone]   Suzanna Obey 11/24/2022, 9:37 AM

## 2022-11-24 NOTE — Progress Notes (Signed)
PT Cancellation Note and Discharge  Patient Details Name: [name] [name] MRN: [id number] DOB: 1950/12/20   Cancelled Treatment:    Reason Eval/Treat Not Completed: PT screened, no needs identified, will sign off. Discussed pt case with OT who reports pt is currently mobilizing without assistance and does not require a formal PT evaluation at this time. PT signing off. If needs change, please reconsult.     Marylynn Pearson 11/24/2022, 11:09 AM  Conni Slipper, PT, DPT Acute Rehabilitation Services Secure Chat Preferred Office: [phone]

## 2022-11-24 NOTE — Discharge Instructions (Signed)
Wound Care Remove dressing in 3 days Leave incision open to air. You may shower. Do not scrub directly on incision.  Do not put any creams, lotions, or ointments on incision. Activity Walk each and every day, increasing distance each day. No lifting greater than 8 lbs.  Avoid excessive neck motion. No driving for 2 weeks; may ride as a passenger locally. Wear neck brace at all times except when showering.  Diet Resume your normal diet.   Call Your Doctor If Any of These Occur Redness, drainage, or swelling at the wound.  Temperature greater than 101 degrees. Severe pain not relieved by pain medication. Increased difficulty swallowing. Incision starts to come apart. Follow Up Appt Call  [phone]) for problems.  If you have any hardware placed in your spine, you will need an x-ray before your appointment.

## 2022-11-24 NOTE — Discharge Summary (Signed)
  Discharge Summary   Date of Admission: 11/23/22   Date of Discharge: 11/24/22   Attending Physician:  [facility] Course: Patient was admitted following an uncomplicated C3-5 ACDF. They were recovered in PACU and transferred to [facility]. Pt to f/u in office in 7-10d for post op visit. Pt is in agreement w/ plan.    Neurologic exam at discharge:  A&O x3 Strength 4/5 x4.  SILTx4.  Incision c/d/I.      Discharge diagnosis: C3-5 cervical spondylotic myelopathy with cord signal change    Patrici Ranks, [facility]

## 2022-11-24 NOTE — Anesthesia Postprocedure Evaluation (Signed)
Anesthesia Post Note  Patient: [name] [name]  Procedure(s) Performed: Anterior Cervical Decompression Fusion Cervical three-four, Cervical four-five (Spine Cervical)     Patient location during evaluation: PACU Anesthesia Type: General Level of consciousness: awake Pain management: pain level controlled Vital Signs Assessment: post-procedure vital signs reviewed and stable Respiratory status: spontaneous breathing, nonlabored ventilation and respiratory function stable Cardiovascular status: blood pressure returned to baseline and stable Postop Assessment: no apparent nausea or vomiting Anesthetic complications: yes   Encounter Notable Events  Notable Event Outcome Phase Comment  Difficult to intubate - expected  Intraprocedure Filed from anesthesia note documentation.    Last Vitals:  Vitals:   11/24/22 0015 11/24/22 0404  BP: (!) 160/80 (!) 174/85  Pulse: 67 65  Resp: 20 18  Temp: 36.8 C 37 C  SpO2: 95% 98%    Last Pain:  Vitals:   11/24/22 0404  TempSrc: Oral  PainSc:                  Catheryn Bacon Marelin Tat

## 2022-11-27 ENCOUNTER — Encounter: Payer: Medicare HMO | Admitting: Orthopaedic Surgery

## 2022-12-04 DIAGNOSIS — G959 Disease of spinal cord, unspecified: Secondary | ICD-10-CM | POA: Diagnosis not present

## 2023-01-05 IMAGING — RF DG LUMBAR SPINE COMPLETE 4+V
1 series · 4 of 4 positions shown · non-contrast
Comparison: Lumbar spine MRI 11/01/2020.

CLINICAL DATA: Surgery, elective FU4.T ([id number]). Additional
history provided by technologist: Right L3-4 TLIF and right L2-3
lateral recess decompression for lumbar spondylolisthesis L3-4 above
L4 to S1 fusion, and right L2-3 lateral recess stenosis.

EXAM:
LUMBAR SPINE - COMPLETE 4+ VIEW; DG C-ARM 1-60 MIN

[Series 1: run · 4 of 4 slices shown]
[im 1/4]
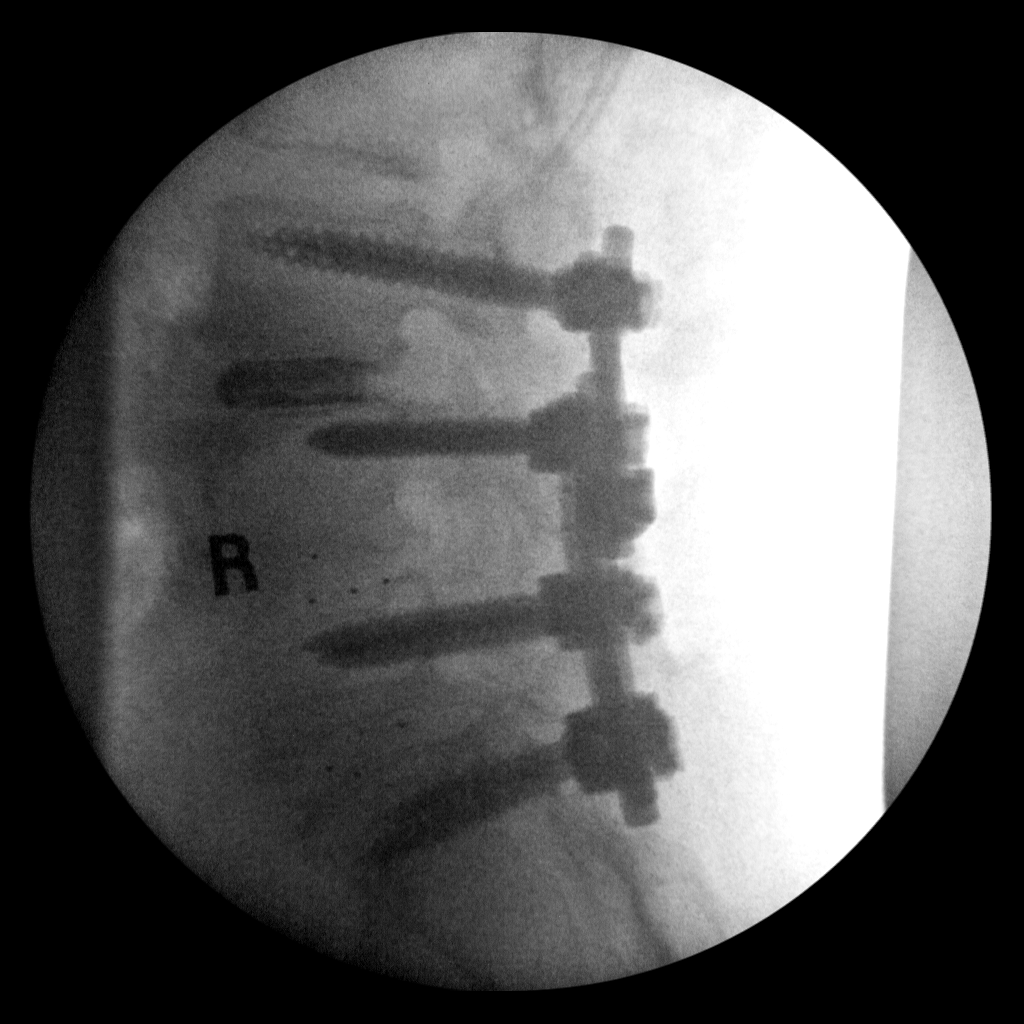
[im 2/4]
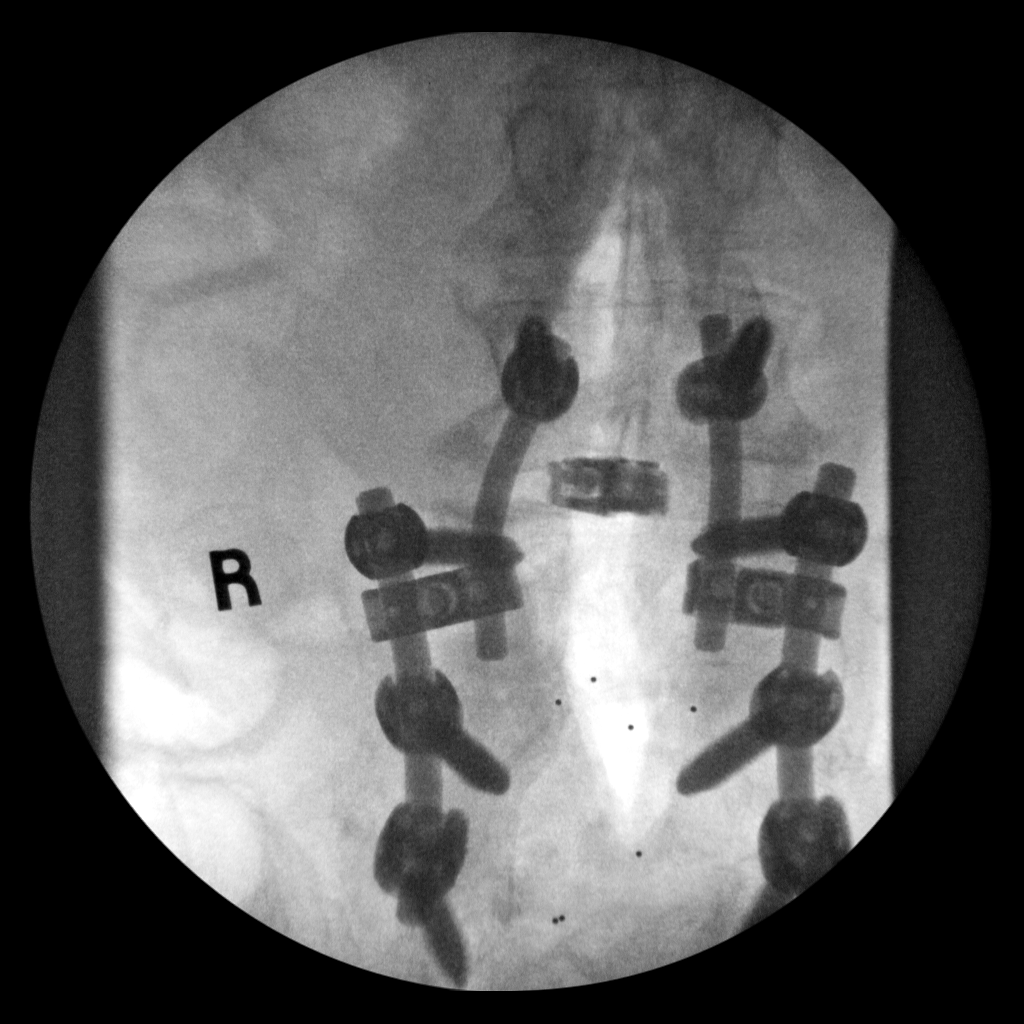
[im 3/4]
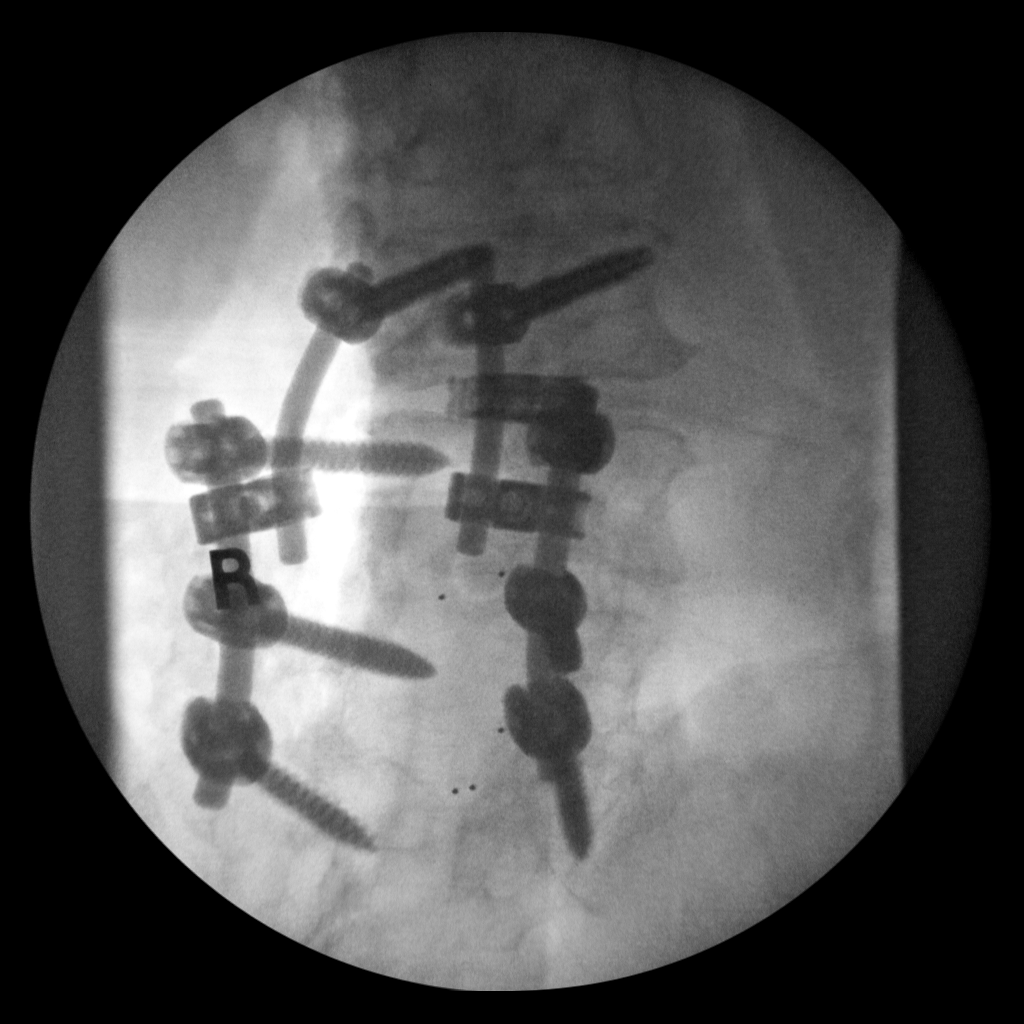
[im 4/4]
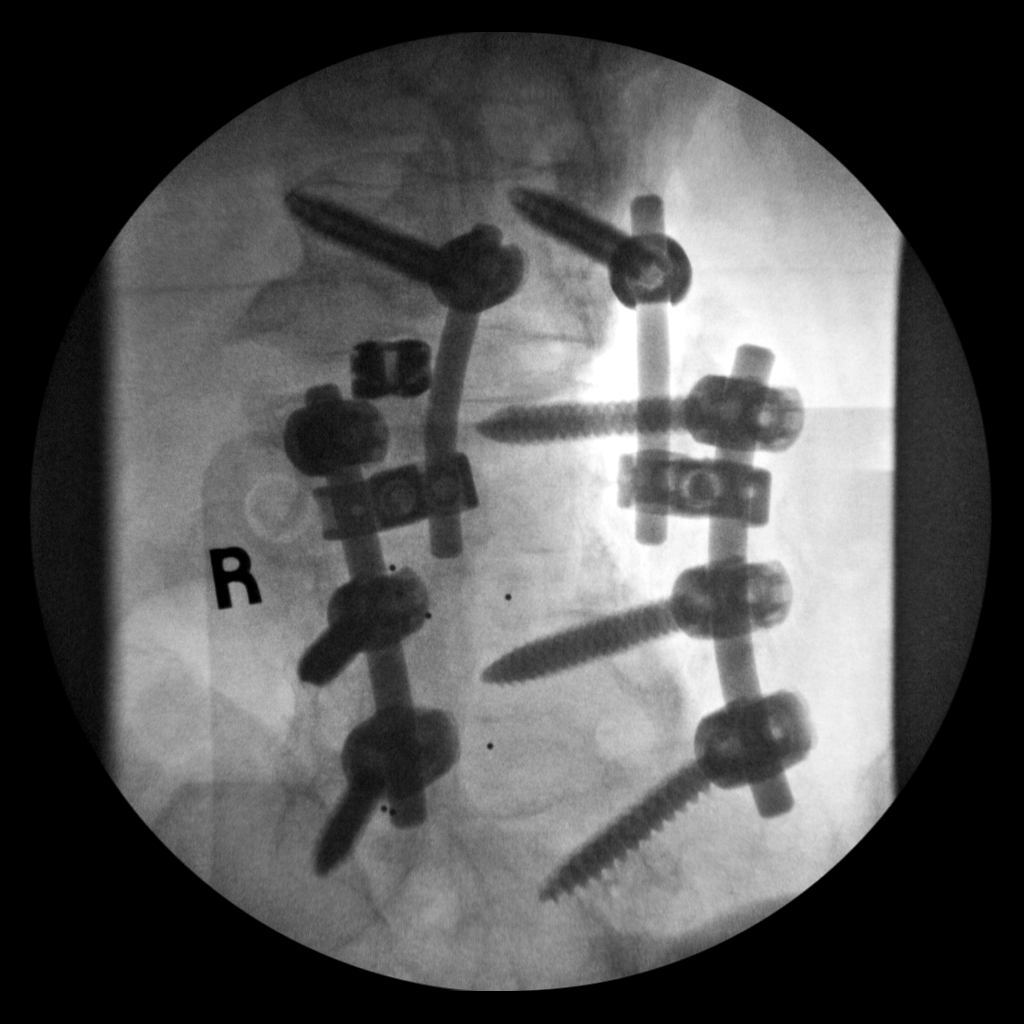

[4 of 4 positions shown; findings below may reference images not displayed]

FINDINGS: AP, oblique and lateral view intraoperative fluoroscopic images of
the lumbosacral spine are submitted, 4 images total. On the provided
images, a posterior spinal fusion construct now spans the L3-S1
levels. Interbody device(s) also now present at L3-L4.
Redemonstrated interbody devices at L4-L5 and L5-S1.
IMPRESSION: Four intraoperative fluoroscopic images of the lumbosacral spine, as
described. Correlate with the operative history.

## 2023-01-12 NOTE — Pre-Procedure Instructions (Signed)
Surgical Instructions    Your procedure is scheduled on January 23, 2023.  Report to [facility] Main Entrance "A" at 11:55 A.M., then check in with the Admitting office.  Call this number if you have problems the morning of surgery:  [phone]   If you have any questions prior to your surgery date call [phone]: Open Monday-Friday 8am-4pm If you experience any cold or flu symptoms such as cough, fever, chills, shortness of breath, etc. between now and your scheduled surgery, please notify us at the above number     Remember:  Do not eat after midnight the night before your surgery  You may drink clear liquids until 10:55AM the morning of your surgery.   Clear liquids allowed are: Water, Non-Citrus Juices (without pulp), Carbonated Beverages, Clear Tea, Black Coffee ONLY (NO MILK, CREAM OR POWDERED CREAMER of any kind), and Gatorade  Patient Instructions  The night before surgery:  No food after midnight. ONLY clear liquids after midnight    The day of surgery (if you have diabetes): Drink ONE (1) 12 oz G2 given to you in your pre admission testing appointment by 10:55 AM the morning of surgery. Drink in one sitting. Do not sip.  This drink was given to you during your hospital  pre-op appointment visit.  Nothing else to drink after completing the  12 oz bottle of G2.         If you have questions, please contact your surgeon's office.     Take these medicines the morning of surgery with A SIP OF WATER:  atorvastatin (LIPITOR)  gabapentin (NEURONTIN)   If needed: acetaminophen (TYLENOL)  albuterol (VENTOLIN HFA) 108 (90 Base) MCG/ACT inhaler - Please bring all inhalers with you the day of surgery.  famotidine (PEPCID)  methocarbamol (ROBAXIN)   Follow your surgeon's instructions on when to stop Aspirin.  If no instructions were given by your surgeon then you will need to call the office to get those instructions.    As of today, STOP taking any diclofenac Sodium  (VOLTAREN) 1 % GEL, Aleve, Naproxen, Ibuprofen, Motrin, Advil, Goody's, BC's, all herbal medications, fish oil, and all vitamins.  WHAT DO I DO ABOUT MY DIABETES MEDICATION?   Do not take oral diabetes medicines (pills) the morning of surgery. DO NOT TAKE Metformin (Glucophage) the day of surgery.   The day of surgery, do not take other diabetes injectables, including Byetta (exenatide), Bydureon (exenatide ER), Victoza (liraglutide), or Trulicity (dulaglutide).  If your CBG is greater than 220 mg/dL, you may take  of your sliding scale (correction) dose of insulin.   HOW TO MANAGE YOUR DIABETES BEFORE AND AFTER SURGERY  Why is it important to control my blood sugar before and after surgery? Improving blood sugar levels before and after surgery helps healing and can limit problems. A way of improving blood sugar control is eating a healthy diet by:  Eating less sugar and carbohydrates  Increasing activity/exercise  Talking with your doctor about reaching your blood sugar goals High blood sugars (greater than 180 mg/dL) can raise your risk of infections and slow your recovery, so you will need to focus on controlling your diabetes during the weeks before surgery. Make sure that the doctor who takes care of your diabetes knows about your planned surgery including the date and location.  How do I manage my blood sugar before surgery? Check your blood sugar at least 4 times a day, starting 2 days before surgery, to make sure that the  level is not too high or low.  Check your blood sugar the morning of your surgery when you wake up and every 2 hours until you get to the Short Stay unit.  If your blood sugar is less than 70 mg/dL, you will need to treat for low blood sugar: Do not take insulin. Treat a low blood sugar (less than 70 mg/dL) with  cup of clear juice (cranberry or apple), 4 glucose tablets, OR glucose gel. Recheck blood sugar in 15 minutes after treatment (to make sure it is  greater than 70 mg/dL). If your blood sugar is not greater than 70 mg/dL on recheck, call [phone] for further instructions. Report your blood sugar to the short stay nurse when you get to Short Stay.  If you are admitted to the hospital after surgery: Your blood sugar will be checked by the staff and you will probably be given insulin after surgery (instead of oral diabetes medicines) to make sure you have good blood sugar levels. The goal for blood sugar control after surgery is 80-180 mg/dL.            [facility] is not responsible for any belongings or valuables.    Do NOT Smoke (Tobacco/Vaping)  24 hours prior to your procedure  If you use a CPAP at night, you may bring your mask for your overnight stay.   Contacts, glasses, hearing aids, dentures or partials may not be worn into surgery, please bring cases for these belongings   For patients admitted to the hospital, discharge time will be determined by your treatment team.   Patients discharged the day of surgery will not be allowed to drive home, and someone needs to stay with them for 24 hours.   SURGICAL WAITING ROOM VISITATION Patients having surgery or a procedure may have no more than 2 support people in the waiting area - these visitors may rotate.   Children under the age of 11 must have an adult with them who is not the patient. If the patient needs to stay at the hospital during part of their recovery, the visitor guidelines for inpatient rooms apply. Pre-op nurse will coordinate an appropriate time for 1 support person to accompany patient in pre-op.  This support person may not rotate.   Please refer to [email or web address] for the visitor guidelines for Inpatients (after your surgery is over and you are in a regular room).    Special instructions:    Oral Hygiene is also important to reduce your risk of infection.  Remember - BRUSH YOUR TEETH THE MORNING OF  SURGERY WITH YOUR REGULAR TOOTHPASTE     Pre-operative 5 CHG Bath Instructions   You can play a key role in reducing the risk of infection after surgery. Your skin needs to be as free of germs as possible. You can reduce the number of germs on your skin by washing with CHG (chlorhexidine gluconate) soap before surgery. CHG is an antiseptic soap that kills germs and continues to kill germs even after washing.   DO NOT use if you have an allergy to chlorhexidine/CHG or antibacterial soaps. If your skin becomes reddened or irritated, stop using the CHG and notify one of our RNs at [phone].   Please shower with the CHG soap starting 4 days before surgery using the following schedule:     Please keep in mind the following:  DO NOT shave, including legs and underarms, starting the day of your first shower.   You may shave  your face at any point before/day of surgery.  Place clean sheets on your bed the day you start using CHG soap. Use a clean washcloth (not used since being washed) for each shower. DO NOT sleep with pets once you start using the CHG.   CHG Shower Instructions:  If you choose to wash your hair and private area, wash first with your normal shampoo/soap.  After you use shampoo/soap, rinse your hair and body thoroughly to remove shampoo/soap residue.  Turn the water OFF and apply about 3 tablespoons (45 ml) of CHG soap to a CLEAN washcloth.  Apply CHG soap ONLY FROM YOUR NECK DOWN TO YOUR TOES (washing for 3-5 minutes)  DO NOT use CHG soap on face, private areas, open wounds, or sores.  Pay special attention to the area where your surgery is being performed.  If you are having back surgery, having someone wash your back for you may be helpful. Wait 2 minutes after CHG soap is applied, then you may rinse off the CHG soap.  Pat dry with a clean towel  Put on clean clothes/pajamas   If you choose to wear lotion, please use ONLY the CHG-compatible lotions on the back of this  paper.     Additional instructions for the day of surgery: DO NOT APPLY any lotions, deodorants, cologne, or perfumes.   Put on clean/comfortable clothes.  Brush your teeth.  Ask your nurse before applying any prescription medications to the skin.      CHG Compatible Lotions   Aveeno Moisturizing lotion  Cetaphil Moisturizing Cream  Cetaphil Moisturizing Lotion  Clairol Herbal Essence Moisturizing Lotion, Dry Skin  Clairol Herbal Essence Moisturizing Lotion, Extra Dry Skin  Clairol Herbal Essence Moisturizing Lotion, Normal Skin  Curel Age Defying Therapeutic Moisturizing Lotion with Alpha Hydroxy  Curel Extreme Care Body Lotion  Curel Soothing Hands Moisturizing Hand Lotion  Curel Therapeutic Moisturizing Cream, Fragrance-Free  Curel Therapeutic Moisturizing Lotion, Fragrance-Free  Curel Therapeutic Moisturizing Lotion, Original Formula  Eucerin Daily Replenishing Lotion  Eucerin Dry Skin Therapy Plus Alpha Hydroxy Crme  Eucerin Dry Skin Therapy Plus Alpha Hydroxy Lotion  Eucerin Original Crme  Eucerin Original Lotion  Eucerin Plus Crme Eucerin Plus Lotion  Eucerin TriLipid Replenishing Lotion  Keri Anti-Bacterial Hand Lotion  Keri Deep Conditioning Original Lotion Dry Skin Formula Softly Scented  Keri Deep Conditioning Original Lotion, Fragrance Free Sensitive Skin Formula  Keri Lotion Fast Absorbing Fragrance Free Sensitive Skin Formula  Keri Lotion Fast Absorbing Softly Scented Dry Skin Formula  Keri Original Lotion  Keri Skin Renewal Lotion Keri Silky Smooth Lotion  Keri Silky Smooth Sensitive Skin Lotion  Nivea Body Creamy Conditioning Oil  Nivea Body Extra Enriched Lotion  Nivea Body Original Lotion  Nivea Body Sheer Moisturizing Lotion Nivea Crme  Nivea Skin Firming Lotion  NutraDerm 30 Skin Lotion  NutraDerm Skin Lotion  NutraDerm Therapeutic Skin Cream  NutraDerm Therapeutic Skin Lotion  ProShield Protective Hand Cream  Provon moisturizing  lotion   Day of Surgery:  Take a shower with CHG soap. Wear Clean/Comfortable clothing the morning of surgery Do not wear jewelry or makeup. Do not wear lotions, powders, perfumes/cologne or deodorant. Do not shave 48 hours prior to surgery.  Men may shave face and neck. Do not bring valuables to the hospital. Do not wear nail polish, gel polish, artificial nails, or any other type of covering on natural nails (fingers and toes) If you have artificial nails or gel coating that need to be removed by a  nail salon, please have this removed prior to surgery. Artificial nails or gel coating may interfere with anesthesia's ability to adequately monitor your vital signs. Remember to brush your teeth WITH YOUR REGULAR TOOTHPASTE.    If you received a COVID test during your pre-op visit, it is requested that you wear a mask when out in public, stay away from anyone that may not be feeling well, and notify your surgeon if you develop symptoms. If you have been in contact with anyone that has tested positive in the last 10 days, please notify your surgeon.    Please read over the following fact sheets that you were given.

## 2023-01-15 ENCOUNTER — Other Ambulatory Visit: Payer: Self-pay

## 2023-01-15 ENCOUNTER — Encounter (HOSPITAL_COMMUNITY)
Admission: RE | Admit: 2023-01-15 | Discharge: 2023-01-15 | Disposition: A | Discharge status: Home / Self Care | Payer: Medicare HMO | Source: Ambulatory Visit | Attending: Orthopaedic Surgery | Admitting: Orthopaedic Surgery

## 2023-01-15 ENCOUNTER — Encounter (HOSPITAL_COMMUNITY): Payer: Self-pay

## 2023-01-15 VITALS — BP 161/77 | HR 54 | Temp 98.3°F | Resp 17 | Ht 67.0 in | Wt 222.0 lb

## 2023-01-15 DIAGNOSIS — M1712 Unilateral primary osteoarthritis, left knee: Secondary | ICD-10-CM | POA: Insufficient documentation

## 2023-01-15 DIAGNOSIS — R7309 Other abnormal glucose: Secondary | ICD-10-CM

## 2023-01-15 DIAGNOSIS — I2510 Atherosclerotic heart disease of native coronary artery without angina pectoris: Secondary | ICD-10-CM | POA: Diagnosis not present

## 2023-01-15 DIAGNOSIS — N182 Chronic kidney disease, stage 2 (mild): Secondary | ICD-10-CM | POA: Diagnosis not present

## 2023-01-15 DIAGNOSIS — E785 Hyperlipidemia, unspecified: Secondary | ICD-10-CM | POA: Insufficient documentation

## 2023-01-15 DIAGNOSIS — Z01812 Encounter for preprocedural laboratory examination: Secondary | ICD-10-CM | POA: Diagnosis not present

## 2023-01-15 DIAGNOSIS — K219 Gastro-esophageal reflux disease without esophagitis: Secondary | ICD-10-CM | POA: Diagnosis not present

## 2023-01-15 DIAGNOSIS — I252 Old myocardial infarction: Secondary | ICD-10-CM | POA: Insufficient documentation

## 2023-01-15 DIAGNOSIS — I1310 Hypertensive heart and chronic kidney disease without heart failure, with stage 1 through stage 4 chronic kidney disease, or unspecified chronic kidney disease: Secondary | ICD-10-CM | POA: Insufficient documentation

## 2023-01-15 DIAGNOSIS — E1122 Type 2 diabetes mellitus with diabetic chronic kidney disease: Secondary | ICD-10-CM | POA: Diagnosis not present

## 2023-01-15 DIAGNOSIS — G4733 Obstructive sleep apnea (adult) (pediatric): Secondary | ICD-10-CM | POA: Insufficient documentation

## 2023-01-15 DIAGNOSIS — Z01818 Encounter for other preprocedural examination: Secondary | ICD-10-CM

## 2023-01-15 LAB — COMPREHENSIVE METABOLIC PANEL
ALT: 13 U/L (ref 0–44)
AST: 21 U/L (ref 15–41)
Albumin: 3.6 g/dL (ref 3.5–5.0)
Alkaline Phosphatase: 76 U/L (ref 38–126)
Anion gap: 6 (ref 5–15)
BUN: 18 mg/dL (ref 8–23)
CO2: 26 mmol/L (ref 22–32)
Calcium: 9.5 mg/dL (ref 8.9–10.3)
Chloride: 109 mmol/L (ref 98–111)
Creatinine, Ser: 1.18 mg/dL — ABNORMAL HIGH (ref 0.44–1.00)
GFR, Estimated: 49 mL/min — ABNORMAL LOW (ref >60)
Glucose, Bld: 92 mg/dL (ref 70–99)
Potassium: 3.7 mmol/L (ref 3.5–5.1)
Sodium: 141 mmol/L (ref 135–145)
Total Bilirubin: 0.3 mg/dL (ref 0.3–1.2)
Total Protein: 6.6 g/dL (ref 6.5–8.1)

## 2023-01-15 LAB — SURGICAL PCR SCREEN
MRSA, PCR: NEGATIVE
Staphylococcus aureus: NEGATIVE

## 2023-01-15 LAB — CBC
HCT: 37.7 % (ref 36.0–46.0)
Hemoglobin: 12.3 g/dL (ref 12.0–15.0)
MCH: 31.1 pg (ref 26.0–34.0)
MCHC: 32.6 g/dL (ref 30.0–36.0)
MCV: 95.2 fL (ref 80.0–100.0)
Platelets: 184 10*3/uL (ref 150–400)
RBC: 3.96 MIL/uL (ref 3.87–5.11)
RDW: 13.5 % (ref 11.5–15.5)
WBC: 3.3 10*3/uL — ABNORMAL LOW (ref 4.0–10.5)
nRBC: 0 % (ref 0.0–0.2)

## 2023-01-15 LAB — GLUCOSE, CAPILLARY: Glucose-Capillary: 92 mg/dL (ref 70–99)

## 2023-01-15 LAB — HEMOGLOBIN A1C
Hgb A1c MFr Bld: 5.9 % — ABNORMAL HIGH (ref 4.8–5.6)
Mean Plasma Glucose: 122.63 mg/dL

## 2023-01-15 NOTE — Progress Notes (Signed)
PCP -  Dr. Quintin Alto Cardiologist - Hasn't been in "years"  Went to one in [place]  PPM/ICD - denies  Chest x-ray - n/a EKG - 11/14/2022 Stress Test - 04/01/2015 ECHO - 04/01/2015 Cardiac Cath - 04/28/2015  Sleep Study - Diagnosed with sleep apnea CPAP - does not use  Type II diabetic.  States she is pre-diabetic, but takes Metformin Fasting Blood Sugar - Does not check her blood sugars.    Checks Blood Sugar : never  Last dose of GLP1 agonist-  denies GLP1 instructions: denies  Blood Thinner Instructions: denies Aspirin Instructions: states last dose 01/14/2023  ERAS Protcol - Yes, fluids until 1055 G2-   COVID TEST- n/a  Anesthesia review: Yes. DM, CAD, HTN, CKD  Patient denies shortness of breath, fever, cough and chest pain at PAT appointment   All instructions explained to the patient, with a verbal understanding of the material. Patient agrees to go over the instructions while at home for a better understanding. Patient also instructed to self quarantine after being tested for COVID-19. The opportunity to ask questions was provided.

## 2023-01-16 ENCOUNTER — Telehealth: Payer: Self-pay | Admitting: *Deleted

## 2023-01-16 NOTE — Telephone Encounter (Signed)
Ortho bundle 1 year call completed for Right total knee arthroplasty and Pre-op call completed during same phone call for upcoming Left total knee arthroplasty on 01/23/23.

## 2023-01-16 NOTE — Telephone Encounter (Signed)
Attempted 1 year post op call for Right total knee replacement as well as Pre-op call for Left total knee replacement coming up on 01/23/23.

## 2023-01-16 NOTE — Care Plan (Signed)
Call back from patient to [facility] to discuss her upcoming Left total knee arthroplasty with Dr. Magnus Ivan on 01/23/23. She is an Ortho bundle patient through [facility] and is agreeable to case management. She lives alone, but her son is close by and will not be returning to work until mid August, so he will be able to assist her after discharge. Anticipate HHPT will be needed after a short hospital stay. Referral made to [facility] after choice provided. Reviewed post op care instructions. Will continue to follow for needs.

## 2023-01-17 ENCOUNTER — Encounter (HOSPITAL_COMMUNITY): Payer: Self-pay | Admitting: Vascular Surgery

## 2023-01-17 NOTE — Progress Notes (Signed)
Anesthesia Chart Review:  Case: [id number] Date/Time: 01/23/23 1544   Procedure: LEFT TOTAL KNEE ARTHROPLASTY (Left: Knee)   Anesthesia type: Spinal   Pre-op diagnosis: osteoarthritis left knee   [facility] ROOM 09 / [facility] OR   Surgeons: Kathryne Hitch, MD       DISCUSSION: Patient is a 72 year old female scheduled for the above procedure. She is s/p C3-5 ACDF 11/23/22 for cervical myelopathy, discharged home on POD #1.  History includes never smoker, CAD (NSTEMI, s/p cutting balloon atherectomy/DES [facility] 01/01/03), HLD, HTN, GERD, pulmonary nodules (stable small right lung nodlues, no further f/u 02/11/08 CT), DM2, CKD (stage 2), OSA (does not use CPAP), spinal surgery (L4-5 fusion 01/08/04; L5-S1 fusion 06/22/09; right L3-4 TLIF 01/31/21; C3-5 ACDF 11/23/22), osteoarthritis (right TKA 12/16/21).  BMI is consistent with obesity.   She has not been followed by cardiology since 2016. Chronic medical conditions are followed by her PCP Dr. Leandrew Koyanagi with [facility]. He ordered a stress test last on 12/28/20 which was non-ischemic, EF 65%. More recently he ordered an echo done on 03/20/22 showing mild LV wall thickness, LVEF 65-70%, grade 1 diastolic dysfunction, mild AS, normal RV size and systolic function.  A1c 5.9%. She is on metformin.  Reported last ASA 01/14/23.   Stress test ~ years ago. Echo within the past year. Recently tolerated ACDF. Anesthesia team to evaluate on the day of surgery. Discussed with anesthesiologist Adonis Huguenin, MD.   VS: BP (!) 161/77   Pulse (!) 54   Temp 36.8 C   Resp 17   Ht 5\' 7"  (1.702 m)   Wt 100.7 kg   SpO2 98%   BMI 34.77 kg/m    PROVIDERS: Burdine, Ananias Pilgrim, MD is PCP ([facility] in [place]) - She is not currently followed by a cardiologist. Last visit seen was on 06/07/15 with Nona Dell, MD. 12/28/20 stress test was ordered through her PCP. Joycelyn Schmid, MD is neurologist. Evaluated for gait problem on 09/05/22.  Work-up included DATscan and c-spine MRI. She was referred to neurosurgery after MRI showed severe cervical spine stenosis at C3-4.  - Dawley, Kendell Bane, DO is neurosurgeon   LABS: Preoperative labs noted. WBC 3.3, previously 3.8 on 11/14/22. (all labs ordered are listed, but only abnormal results are displayed)  Labs Reviewed  HEMOGLOBIN A1C - Abnormal; Notable for the following components:      Result Value   Hgb A1c MFr Bld 5.9 (*)    All other components within normal limits  CBC - Abnormal; Notable for the following components:   WBC 3.3 (*)    All other components within normal limits  COMPREHENSIVE METABOLIC PANEL - Abnormal; Notable for the following components:   Creatinine, Ser 1.18 (*)    GFR, Estimated 49 (*)    All other components within normal limits  SURGICAL PCR SCREEN  GLUCOSE, CAPILLARY    Sleep Study 05/30/20: IMPRESSIONS -  This recording shows severe obstructive sleep apnea syndrome. AutoPAP 8-14 is recommended.   IMAGES: NM Brain DATScan 09/29/22: IMPRESSION: - Ioflupane scan within normal limits. No reduced radiotracer activity in basal ganglia to suggest Parkinson's syndrome pathology. -  Of note, DaTSCAN is not diagnostic of Parkinsonian syndromes, which remains a clinical diagnosis. DaTscan is an adjuvant test to aid in the clinical diagnosis of Parkinsonian syndromes.   EKG: 11/14/22: Sinus bradycardia. Rate 57. Left ventricular hypertrophy with repolarization abnormality ( R in aVL , Cornell product )    CV: Echo 03/20/22 [facility]  CE): Summary  1. The left ventricle is normal in size with mildly increased wall  thickness.  2. The left ventricular systolic function is normal, LVEF is visually  estimated at 65-70%.  3. There is grade I diastolic dysfunction (impaired relaxation).  4. There is mild aortic valve stenosis.    Peak AV transvalvular velocity:  2.1 m/s.    Mean gradient: 10 mmHg.    Doppler velocity index: 0.72.    Estimated aortic valve  area (VTI): 1.5 cm2.    Estimated aortic valve area (velocity): 1.6 cm2.    LVOT diameter: 1.7 cm.    LV stroke volume index: 31.8 ml/m2.    5. The left atrium is moderately dilated in size.    6. The right ventricle is normal in size, with normal systolic function.   Nuclear stress test 12/28/20 [facility] CE): Impression: 1. No reversible ischemia or infarction.  2. Normal left ventricular wall motion.  3. Left ventricular ejection fraction 65%  4. Non invasive risk stratification*: Low  *2012 Appropriate Use Criteria for Coronary Revascularization  Focused Update: J Am Coll Cardiol. 2012;59(9):857-881.  http://content.dementiazones.com.aspx?articleid=1201161    Cardiac cath 04/28/15: Conclusion 1. Widely patent RCA stent with no significant obstruction in that vessel. 2. Widely patent left main, LAD, and left circumflex 3. Normal LV function by echo 4. Normal LVEDP Suspect noncardiac symptoms.  US Carotid 12/21/05: IMPRESSION:  Scattered atherosclerotic plaque formation bilaterally in carotid systems, most  prominent at proximal left ECA where a high-grade stenosis is seen.  This causes significant increase in peak systolic velocity, end-diastolic  velocity, and turbulence and likely accounts for carotid bruit on exam.  No other definite stenoses are identified.   Past Medical History:  Diagnosis Date   Arthritis    Carpal tunnel syndrome    Chronic kidney disease (CKD), stage II (mild)    Constipation    Coronary artery disease    NSTEMI 2004, DES to ostial RCA   DDD (degenerative disc disease)    Essential hypertension    GERD (gastroesophageal reflux disease)    Hyperlipidemia    IBS (irritable bowel syndrome)    Low back pain    Myocardial infarction [facility]) 2004   one stent placed   Neuropathy    bilateral hands r/t cervical spine issues   Postmenopausal    Pulmonary nodule    Sleep apnea    "waiting on machine to come".   Type 2 diabetes mellitus (HCC)     Vertigo     Past Surgical History:  Procedure Laterality Date   ABDOMINAL HYSTERECTOMY     partial. Pt was in her 64s.   ANTERIOR CERVICAL DECOMP/DISCECTOMY FUSION N/A 11/23/2022   Procedure: Anterior Cervical Decompression Fusion Cervical three-four, Cervical four-five;  Surgeon: Dawley, Alan Mulder, DO;  Location: [facility] OR;  Service: Neurosurgery;  Laterality: N/A;   CARDIAC CATHETERIZATION N/A 04/28/2015   Procedure: Left Heart Cath and Coronary Angiography;  Surgeon: Tonny Bollman, MD;  Location: [facility] INVASIVE CV LAB;  Service: Cardiovascular;  Laterality: N/A;   CATARACT EXTRACTION Bilateral    CATARACT EXTRACTION W/PHACO Right 07/19/2020   Procedure: CATARACT EXTRACTION PHACO AND INTRAOCULAR LENS PLACEMENT RIGHT EYE;  Surgeon: Fabio Pierce, MD;  Location: [facility] ORS;  Service: Ophthalmology;  Laterality: Right;  right CDE=12.56   CATARACT EXTRACTION W/PHACO Left 08/09/2020   Procedure: CATARACT EXTRACTION PHACO AND INTRAOCULAR LENS PLACEMENT (IOC);  Surgeon: Fabio Pierce, MD;  Location: [facility] ORS;  Service: Ophthalmology;  Laterality: Left;  CDE: 6.66  Cervical disectomy and fusion     Cholestectomy     COLONOSCOPY N/A 05/07/2019   Procedure: COLONOSCOPY;  Surgeon: Corbin Ade, MD;  Location: [facility] ENDO SUITE;  Service: Endoscopy;  Laterality: N/A;  12:00   Excision of ovarian cyst     L4-5 with pedicle screws and rods     x2   MULTIPLE TOOTH EXTRACTIONS     Right shoulder RTC     TOTAL KNEE ARTHROPLASTY Right 12/16/2021   Procedure: RIGHT TOTAL KNEE ARTHROPLASTY;  Surgeon: Kathryne Hitch, MD;  Location: [facility] ORS;  Service: Orthopedics;  Laterality: Right;    MEDICATIONS:  acetaminophen (TYLENOL) 325 MG tablet   albuterol (VENTOLIN HFA) 108 (90 Base) MCG/ACT inhaler   aspirin 81 MG chewable tablet   atorvastatin (LIPITOR) 40 MG tablet   Cholecalciferol (VITAMIN D3) 50 MCG (2000 UT) capsule   diclofenac Sodium (VOLTAREN) 1 % GEL   diphenhydramine-acetaminophen (TYLENOL PM)  25-500 MG TABS tablet   famotidine (PEPCID) 20 MG tablet   gabapentin (NEURONTIN) 800 MG tablet   hydrochlorothiazide (HYDRODIURIL) 25 MG tablet   lisinopril (PRINIVIL,ZESTRIL) 40 MG tablet   metFORMIN (GLUCOPHAGE) 500 MG tablet   methocarbamol (ROBAXIN) 500 MG tablet   Polyethyl Glycol-Propyl Glycol (LUBRICATING EYE DROPS OP)   No current facility-administered medications for this encounter.    Shonna Chock, PA-C Surgical Short Stay/Anesthesiology [facility] Phone [phone] [facility] Phone [phone] 01/17/2023 2:55 PM

## 2023-01-17 NOTE — Anesthesia Preprocedure Evaluation (Deleted)
Anesthesia Evaluation    Airway        Dental   Pulmonary           Cardiovascular hypertension,      Neuro/Psych    GI/Hepatic   Endo/Other  diabetes    Renal/GU      Musculoskeletal   Abdominal   Peds  Hematology   Anesthesia Other Findings   Reproductive/Obstetrics                             Anesthesia Physical Anesthesia Plan  ASA:   Anesthesia Plan:    Post-op Pain Management:    Induction:   PONV Risk Score and Plan:   Airway Management Planned:   Additional Equipment:   Intra-op Plan:   Post-operative Plan:   Informed Consent:   Plan Discussed with:   Anesthesia Plan Comments: (PAT note written 01/17/2023 by Shonna Chock, PA-C.  )       Anesthesia Quick Evaluation

## 2023-01-23 ENCOUNTER — Ambulatory Visit (HOSPITAL_COMMUNITY): Admission: RE | Admit: 2023-01-23 | Payer: Medicare HMO | Source: Home / Self Care | Admitting: Orthopaedic Surgery

## 2023-01-23 DIAGNOSIS — M1712 Unilateral primary osteoarthritis, left knee: Secondary | ICD-10-CM

## 2023-01-23 DIAGNOSIS — M1711 Unilateral primary osteoarthritis, right knee: Secondary | ICD-10-CM

## 2023-01-23 SURGERY — ARTHROPLASTY, KNEE, TOTAL
Anesthesia: Spinal | Site: Knee | Laterality: Left

## 2023-02-05 ENCOUNTER — Encounter: Payer: Medicare HMO | Admitting: Orthopaedic Surgery

## 2023-02-07 NOTE — Progress Notes (Signed)
COVID Vaccine Completed:  Date of COVID positive in last 90 days:  PCP - Quintin Alto, MD Cardiologist -   Chest x-ray -  EKG - 11/14/22 Epic Stress Test - 12/28/20 [facility] ECHO - 03/20/22 [facility] Cardiac Cath - 04/28/15 Epic Pacemaker/ICD device last checked: Spinal Cord Stimulator:  Bowel Prep -   Sleep Study -  CPAP -   Fasting Blood Sugar - preDM Checks Blood Sugar _____ times a day  Last dose of GLP1 agonist-  N/A GLP1 instructions:  N/A   Last dose of SGLT-2 inhibitors-  N/A SGLT-2 instructions: N/A   Blood Thinner Instructions:  Time Aspirin Instructions: ASA 81 Last Dose:  Activity level:  Can go up a flight of stairs and perform activities of daily living without stopping and without symptoms of chest pain or shortness of breath.  Able to exercise without symptoms  Unable to go up a flight of stairs without symptoms of     Anesthesia review: MI, HTN, DM2, OSA, CKD, CAD  Patient denies shortness of breath, fever, cough and chest pain at PAT appointment  Patient verbalized understanding of instructions that were given to them at the PAT appointment. Patient was also instructed that they will need to review over the PAT instructions again at home before surgery.

## 2023-02-07 NOTE — Patient Instructions (Signed)
SURGICAL WAITING ROOM VISITATION  Patients having surgery or a procedure may have no more than 2 support people in the waiting area - these visitors may rotate.    Children under the age of 77 must have an adult with them who is not the patient.  Due to an increase in RSV and influenza rates and associated hospitalizations, children ages 23 and under may not visit patients in [facility] hospitals.  If the patient needs to stay at the hospital during part of their recovery, the visitor guidelines for inpatient rooms apply. Pre-op nurse will coordinate an appropriate time for 1 support person to accompany patient in pre-op.  This support person may not rotate.    Please refer to the [facility] website for the visitor guidelines for Inpatients (after your surgery is over and you are in a regular room).    Your procedure is scheduled on: 02/13/23   Report to [facility] Main Entrance    Report to admitting at 10:00 AM   Call this number if you have problems the morning of surgery [phone]   Do not eat food :After Midnight.   After Midnight you may have the following liquids until 9:30 AM DAY OF SURGERY  Water Non-Citrus Juices (without pulp, NO RED-Apple, White grape, White cranberry) Black Coffee (NO MILK/CREAM OR CREAMERS, sugar ok)  Clear Tea (NO MILK/CREAM OR CREAMERS, sugar ok) regular and decaf                             Plain Jell-O (NO RED)                                           Fruit ices (not with fruit pulp, NO RED)                                     Popsicles (NO RED)                                                               Sports drinks like Gatorade (NO RED)                 The day of surgery:  Drink ONE (1) Pre-Surgery G2 at 9:30 AM the morning of surgery. Drink in one sitting. Do not sip.  This drink was given to you during your hospital  pre-op appointment visit. Nothing else to drink after completing the  Pre-Surgery G2.          If you  have questions, please contact your surgeon's office.   FOLLOW BOWEL PREP AND ANY ADDITIONAL PRE OP INSTRUCTIONS YOU RECEIVED FROM YOUR SURGEON'S OFFICE!!!     Oral Hygiene is also important to reduce your risk of infection.                                    Remember - BRUSH YOUR TEETH THE MORNING OF SURGERY WITH YOUR REGULAR TOOTHPASTE  DENTURES WILL BE REMOVED  PRIOR TO SURGERY PLEASE DO NOT APPLY "Poly grip" OR ADHESIVES!!!   Stop all vitamins and herbal supplements 7 days before surgery.   Take these medicines the morning of surgery with A SIP OF WATER: Tylenol, Albuterol, Atorvastatin, Famotidine, Gabapentin                               You may not have any metal on your body including hair pins, jewelry, and body piercing             Do not wear make-up, lotions, powders, perfumes, or deodorant  Do not wear nail polish including gel and S&S, artificial/acrylic nails, or any other type of covering on natural nails including finger and toenails. If you have artificial nails, gel coating, etc. that needs to be removed by a nail salon please have this removed prior to surgery or surgery may need to be canceled/ delayed if the surgeon/ anesthesia feels like they are unable to be safely monitored.   Do not shave  48 hours prior to surgery.    Do not bring valuables to the hospital. [facility] IS NOT             RESPONSIBLE   FOR VALUABLES.   Contacts, glasses, dentures or bridgework may not be worn into surgery.   Bring small overnight bag day of surgery.   DO NOT BRING YOUR HOME MEDICATIONS TO THE HOSPITAL. PHARMACY WILL DISPENSE MEDICATIONS LISTED ON YOUR MEDICATION LIST TO YOU DURING YOUR ADMISSION IN THE HOSPITAL!              Please read over the following fact sheets you were given: IF YOU HAVE QUESTIONS ABOUT YOUR PRE-OP INSTRUCTIONS PLEASE CALL [phone]    If you received a COVID test during your pre-op visit  it is requested that you wear a mask when out in  public, stay away from anyone that may not be feeling well and notify your surgeon if you develop symptoms. If you test positive for Covid or have been in contact with anyone that has tested positive in the last 10 days please notify you surgeon.      Pre-operative 5 CHG Bath Instructions   You can play a key role in reducing the risk of infection after surgery. Your skin needs to be as free of germs as possible. You can reduce the number of germs on your skin by washing with CHG (chlorhexidine gluconate) soap before surgery. CHG is an antiseptic soap that kills germs and continues to kill germs even after washing.   DO NOT use if you have an allergy to chlorhexidine/CHG or antibacterial soaps. If your skin becomes reddened or irritated, stop using the CHG and notify one of our RNs at [phone].   Please shower with the CHG soap starting 4 days before surgery using the following schedule:     Please keep in mind the following:  DO NOT shave, including legs and underarms, starting the day of your first shower.   You may shave your face at any point before/day of surgery.  Place clean sheets on your bed the day you start using CHG soap. Use a clean washcloth (not used since being washed) for each shower. DO NOT sleep with pets once you start using the CHG.   CHG Shower Instructions:  If you choose to wash your hair and private area, wash first with your normal shampoo/soap.  After you  use shampoo/soap, rinse your hair and body thoroughly to remove shampoo/soap residue.  Turn the water OFF and apply about 3 tablespoons (45 ml) of CHG soap to a CLEAN washcloth.  Apply CHG soap ONLY FROM YOUR NECK DOWN TO YOUR TOES (washing for 3-5 minutes)  DO NOT use CHG soap on face, private areas, open wounds, or sores.  Pay special attention to the area where your surgery is being performed.  If you are having back surgery, having someone wash your back for you may be helpful. Wait 2 minutes after CHG  soap is applied, then you may rinse off the CHG soap.  Pat dry with a clean towel  Put on clean clothes/pajamas   If you choose to wear lotion, please use ONLY the CHG-compatible lotions on the back of this paper.     Additional instructions for the day of surgery: DO NOT APPLY any lotions, deodorants, cologne, or perfumes.   Put on clean/comfortable clothes.  Brush your teeth.  Ask your nurse before applying any prescription medications to the skin.      CHG Compatible Lotions   Aveeno Moisturizing lotion  Cetaphil Moisturizing Cream  Cetaphil Moisturizing Lotion  Clairol Herbal Essence Moisturizing Lotion, Dry Skin  Clairol Herbal Essence Moisturizing Lotion, Extra Dry Skin  Clairol Herbal Essence Moisturizing Lotion, Normal Skin  Curel Age Defying Therapeutic Moisturizing Lotion with Alpha Hydroxy  Curel Extreme Care Body Lotion  Curel Soothing Hands Moisturizing Hand Lotion  Curel Therapeutic Moisturizing Cream, Fragrance-Free  Curel Therapeutic Moisturizing Lotion, Fragrance-Free  Curel Therapeutic Moisturizing Lotion, Original Formula  Eucerin Daily Replenishing Lotion  Eucerin Dry Skin Therapy Plus Alpha Hydroxy Crme  Eucerin Dry Skin Therapy Plus Alpha Hydroxy Lotion  Eucerin Original Crme  Eucerin Original Lotion  Eucerin Plus Crme Eucerin Plus Lotion  Eucerin TriLipid Replenishing Lotion  Keri Anti-Bacterial Hand Lotion  Keri Deep Conditioning Original Lotion Dry Skin Formula Softly Scented  Keri Deep Conditioning Original Lotion, Fragrance Free Sensitive Skin Formula  Keri Lotion Fast Absorbing Fragrance Free Sensitive Skin Formula  Keri Lotion Fast Absorbing Softly Scented Dry Skin Formula  Keri Original Lotion  Keri Skin Renewal Lotion Keri Silky Smooth Lotion  Keri Silky Smooth Sensitive Skin Lotion  Nivea Body Creamy Conditioning Oil  Nivea Body Extra Enriched Lotion  Nivea Body Original Lotion  Nivea Body Sheer Moisturizing Lotion Nivea Crme  Nivea  Skin Firming Lotion  NutraDerm 30 Skin Lotion  NutraDerm Skin Lotion  NutraDerm Therapeutic Skin Cream  NutraDerm Therapeutic Skin Lotion  ProShield Protective Hand Cream  Provon moisturizing lotion   Incentive Spirometer  An incentive spirometer is a tool that can help keep your lungs clear and active. This tool measures how well you are filling your lungs with each breath. Taking long deep breaths may help reverse or decrease the chance of developing breathing (pulmonary) problems (especially infection) following: A long period of time when you are unable to move or be active. BEFORE THE PROCEDURE  If the spirometer includes an indicator to show your best effort, your nurse or respiratory therapist will set it to a desired goal. If possible, sit up straight or lean slightly forward. Try not to slouch. Hold the incentive spirometer in an upright position. INSTRUCTIONS FOR USE  Sit on the edge of your bed if possible, or sit up as far as you can in bed or on a chair. Hold the incentive spirometer in an upright position. Breathe out normally. Place the mouthpiece in your mouth and seal your  lips tightly around it. Breathe in slowly and as deeply as possible, raising the piston or the ball toward the top of the column. Hold your breath for 3-5 seconds or for as long as possible. Allow the piston or ball to fall to the bottom of the column. Remove the mouthpiece from your mouth and breathe out normally. Rest for a few seconds and repeat Steps 1 through 7 at least 10 times every 1-2 hours when you are awake. Take your time and take a few normal breaths between deep breaths. The spirometer may include an indicator to show your best effort. Use the indicator as a goal to work toward during each repetition. After each set of 10 deep breaths, practice coughing to be sure your lungs are clear. If you have an incision (the cut made at the time of surgery), support your incision when coughing by  placing a pillow or rolled up towels firmly against it. Once you are able to get out of bed, walk around indoors and cough well. You may stop using the incentive spirometer when instructed by your caregiver.  RISKS AND COMPLICATIONS Take your time so you do not get dizzy or light-headed. If you are in pain, you may need to take or ask for pain medication before doing incentive spirometry. It is harder to take a deep breath if you are having pain. AFTER USE Rest and breathe slowly and easily. It can be helpful to keep track of a log of your progress. Your caregiver can provide you with a simple table to help with this. If you are using the spirometer at home, follow these instructions: SEEK MEDICAL CARE IF:  You are having difficultly using the spirometer. You have trouble using the spirometer as often as instructed. Your pain medication is not giving enough relief while using the spirometer. You develop fever of 100.5 F (38.1 C) or higher. SEEK IMMEDIATE MEDICAL CARE IF:  You cough up bloody sputum that had not been present before. You develop fever of 102 F (38.9 C) or greater. You develop worsening pain at or near the incision site. MAKE SURE YOU:  Understand these instructions. Will watch your condition. Will get help right away if you are not doing well or get worse. Document Released: 10/23/2006 Document Revised: 09/04/2011 Document Reviewed: 12/24/2006 [facility] Patient Information 2014 [place], [place].   ________________________________________________________________________

## 2023-02-08 ENCOUNTER — Encounter (HOSPITAL_COMMUNITY)
Admission: RE | Admit: 2023-02-08 | Discharge: 2023-02-08 | Disposition: A | Discharge status: Home / Self Care | Payer: Medicare HMO | Source: Ambulatory Visit | Attending: Orthopaedic Surgery | Admitting: Orthopaedic Surgery

## 2023-02-08 ENCOUNTER — Encounter (HOSPITAL_COMMUNITY): Payer: Self-pay

## 2023-02-08 ENCOUNTER — Other Ambulatory Visit: Payer: Self-pay

## 2023-02-08 VITALS — BP 168/89 | HR 58 | Temp 98.6°F | Resp 16 | Ht 67.0 in | Wt 220.0 lb

## 2023-02-08 DIAGNOSIS — Z01818 Encounter for other preprocedural examination: Secondary | ICD-10-CM | POA: Diagnosis not present

## 2023-02-08 HISTORY — DX: Pneumonia, unspecified organism: J18.9

## 2023-02-08 LAB — SURGICAL PCR SCREEN
MRSA, PCR: NEGATIVE
Staphylococcus aureus: NEGATIVE

## 2023-02-09 NOTE — Progress Notes (Signed)
Patient called stating her PCP has started her on Metoprolol 25mg  daily. Added to medication lise and instructed that she can take this DOS.

## 2023-02-12 NOTE — H&P (Signed)
TOTAL KNEE ADMISSION H&P  Patient is being admitted for left total knee arthroplasty.  Subjective:  Chief Complaint:left knee pain.  HPI: [name] [name], 72 y.o. female, has a history of pain and functional disability in the left knee due to arthritis and has failed non-surgical conservative treatments for greater than 12 weeks to includeNSAID's and/or analgesics, corticosteriod injections, viscosupplementation injections, flexibility and strengthening excercises, use of assistive devices, weight reduction as appropriate, and activity modification.  Onset of symptoms was gradual, starting 2 years ago with gradually worsening course since that time. The patient noted no past surgery on the left knee(s).  Patient currently rates pain in the left knee(s) at 10 out of 10 with activity. Patient has night pain, worsening of pain with activity and weight bearing, pain that interferes with activities of daily living, pain with passive range of motion, crepitus, and joint swelling.  Patient has evidence of subchondral sclerosis, periarticular osteophytes, and joint space narrowing by imaging studies. There is no active infection.  Patient Active Problem List   Diagnosis Date Noted   Cervical spinal stenosis 11/23/2022   Unilateral primary osteoarthritis, left knee 08/24/2022   Status post total right knee replacement 12/16/2021   Chronic RLQ pain 04/29/2021   Periumbilical pain 04/29/2021   Spinal stenosis of lumbar region with neurogenic claudication    Spondylolisthesis, lumbar region    Other spondylosis with radiculopathy, lumbar region    Acute postoperative anemia due to expected blood loss 02/01/2021   Fusion of spine of lumbar region 01/31/2021   S/P lumbar fusion L4 to Sacrum 06/22/09 10/11/2020   Myalgia and myositis 06/12/2018   Other and unspecified nonspecific immunological findings 04/25/2018   Osteoarthrosis, hand 10/19/2017   Insomnia 09/23/2015   Plantar fascial fibromatosis  09/23/2015   Shortness of breath 04/28/2015   Abnormal myocardial perfusion study    Disturbance of skin sensation 08/05/2014   Chronic kidney disease, stage III (moderate) (HCC) 02/02/2014   Impaired fasting glucose 02/02/2014   Vitamin D deficiency 10/30/2013   Other osteoporosis 07/29/2012   Myocardial infarction (HCC) 12/29/2010   Cardiovascular disease 05/26/2009   KNEE PAIN, LEFT 07/02/2008   HIP PAIN 04/27/2008   OBESITY 02/24/2008   HYPERGLYCEMIA, FASTING 02/10/2008   GERD 03/12/2007   ANXIETY DEPRESSION 08/28/2006   DEGENERATIVE DISC DISEASE 08/28/2006   HYPERLIPIDEMIA 05/29/2006   CARPAL TUNNEL SYNDROME 05/29/2006   HYPERTENSION 05/29/2006   Unspecified constipation 05/29/2006   IBS 05/29/2006   ARTHRITIS 05/29/2006   LOW BACK PAIN 05/29/2006   Past Medical History:  Diagnosis Date   Arthritis    Carpal tunnel syndrome    Chronic kidney disease (CKD), stage II (mild)    Constipation    Coronary artery disease    NSTEMI 2004, DES to ostial RCA   DDD (degenerative disc disease)    Essential hypertension    GERD (gastroesophageal reflux disease)    Hyperlipidemia    IBS (irritable bowel syndrome)    Low back pain    Myocardial infarction (HCC) 2004   one stent placed   Neuropathy    bilateral hands r/t cervical spine issues   Pneumonia    Postmenopausal    Pulmonary nodule    Sleep apnea    "waiting on machine to come".   Type 2 diabetes mellitus (HCC)    Vertigo     Past Surgical History:  Procedure Laterality Date   ABDOMINAL HYSTERECTOMY     partial. Pt was in her 57s.   ANTERIOR CERVICAL DECOMP/DISCECTOMY  FUSION N/A 11/23/2022   Procedure: Anterior Cervical Decompression Fusion Cervical three-four, Cervical four-five;  Surgeon: Dawley, Alan Mulder, DO;  Location: [facility] OR;  Service: Neurosurgery;  Laterality: N/A;   CARDIAC CATHETERIZATION N/A 04/28/2015   Procedure: Left Heart Cath and Coronary Angiography;  Surgeon: Tonny Bollman, MD;  Location: [facility]  INVASIVE CV LAB;  Service: Cardiovascular;  Laterality: N/A;   CATARACT EXTRACTION Bilateral    CATARACT EXTRACTION W/PHACO Right 07/19/2020   Procedure: CATARACT EXTRACTION PHACO AND INTRAOCULAR LENS PLACEMENT RIGHT EYE;  Surgeon: Fabio Pierce, MD;  Location: [facility] ORS;  Service: Ophthalmology;  Laterality: Right;  right CDE=12.56   CATARACT EXTRACTION W/PHACO Left 08/09/2020   Procedure: CATARACT EXTRACTION PHACO AND INTRAOCULAR LENS PLACEMENT (IOC);  Surgeon: Fabio Pierce, MD;  Location: [facility] ORS;  Service: Ophthalmology;  Laterality: Left;  CDE: 6.66   Cervical disectomy and fusion     Cholestectomy     COLONOSCOPY N/A 05/07/2019   Procedure: COLONOSCOPY;  Surgeon: Corbin Ade, MD;  Location: [facility] ENDO SUITE;  Service: Endoscopy;  Laterality: N/A;  12:00   Excision of ovarian cyst     L4-5 with pedicle screws and rods     x2   MULTIPLE TOOTH EXTRACTIONS     Right shoulder RTC     TOTAL KNEE ARTHROPLASTY Right 12/16/2021   Procedure: RIGHT TOTAL KNEE ARTHROPLASTY;  Surgeon: Kathryne Hitch, MD;  Location: [facility] ORS;  Service: Orthopedics;  Laterality: Right;    No current facility-administered medications for this encounter.   Current Outpatient Medications  Medication Sig Dispense Refill Last Dose   acetaminophen (TYLENOL) 500 MG tablet Take 1,000 mg by mouth every 6 (six) hours as needed for moderate pain.      albuterol (VENTOLIN HFA) 108 (90 Base) MCG/ACT inhaler Inhale 2 puffs into the lungs every 6 (six) hours as needed for wheezing or shortness of breath.      aspirin 81 MG chewable tablet Chew 1 tablet (81 mg total) by mouth daily.      atorvastatin (LIPITOR) 40 MG tablet Take 40 mg by mouth daily.      cholecalciferol (VITAMIN D3) 25 MCG (1000 UNIT) tablet Take 1,000 Units by mouth daily.      famotidine (PEPCID) 20 MG tablet Take 20 mg by mouth daily as needed for heartburn or indigestion.      gabapentin (NEURONTIN) 800 MG tablet Take 800 mg by mouth 3 (three) times  daily.      hydrochlorothiazide (HYDRODIURIL) 25 MG tablet Take 25 mg by mouth daily.      lisinopril (PRINIVIL,ZESTRIL) 40 MG tablet Take 40 mg by mouth daily.      metFORMIN (GLUCOPHAGE) 500 MG tablet Take 500 mg by mouth daily.      Polyethyl Glycol-Propyl Glycol (LUBRICATING EYE DROPS OP) Place 1 drop into both eyes daily as needed (dry eyes).      metoprolol tartrate (LOPRESSOR) 25 MG tablet Take 25 mg by mouth 2 (two) times daily. Take for 14 days      Allergies  Allergen Reactions   Baclofen     Upset stomach, excessive fatigue, loss of balance    Methocarbamol     Upset stomach, excessive fatigue, loss of balance     Social History   Tobacco Use   Smoking status: Never   Smokeless tobacco: Never  Substance Use Topics   Alcohol use: No    Alcohol/week: 0.0 standard drinks of alcohol    Family History  Problem Relation Age of Onset  Stroke Mother    Diabetes Mellitus II Mother    Kidney disease Father    Hypertension Brother    Diabetes Mellitus II Brother    Diabetes Mellitus II Sister      Review of Systems  Objective:  Physical Exam  Vital signs in last 24 hours:    Labs:   Estimated body mass index is 34.46 kg/m as calculated from the following:   Height as of 02/08/23: 5\' 7"  (1.702 m).   Weight as of 02/08/23: 99.8 kg.   Imaging Review Plain radiographs demonstrate severe degenerative joint disease of the left knee(s). The overall alignment ismild varus. The bone quality appears to be good for age and reported activity level.      Assessment/Plan:  End stage arthritis, left knee   The patient history, physical examination, clinical judgment of the provider and imaging studies are consistent with end stage degenerative joint disease of the left knee(s) and total knee arthroplasty is deemed medically necessary. The treatment options including medical management, injection therapy arthroscopy and arthroplasty were discussed at length. The risks and  benefits of total knee arthroplasty were presented and reviewed. The risks due to aseptic loosening, infection, stiffness, patella tracking problems, thromboembolic complications and other imponderables were discussed. The patient acknowledged the explanation, agreed to proceed with the plan and consent was signed. Patient is being admitted for inpatient treatment for surgery, pain control, PT, OT, prophylactic antibiotics, VTE prophylaxis, progressive ambulation and ADL's and discharge planning. The patient is planning to be discharged home with home health services

## 2023-02-13 ENCOUNTER — Other Ambulatory Visit: Payer: Self-pay

## 2023-02-13 ENCOUNTER — Encounter (HOSPITAL_COMMUNITY)
Admission: RE | Disposition: A | Discharge status: Skilled Nursing Facility | Payer: Self-pay | Source: Ambulatory Visit | Attending: Orthopaedic Surgery

## 2023-02-13 ENCOUNTER — Encounter (HOSPITAL_COMMUNITY): Payer: Self-pay | Admitting: Orthopaedic Surgery

## 2023-02-13 ENCOUNTER — Observation Stay (HOSPITAL_COMMUNITY)
Admission: RE | Admit: 2023-02-13 | Discharge: 2023-02-16 | Disposition: A | Discharge status: Skilled Nursing Facility | Payer: Medicare HMO | Source: Ambulatory Visit | Attending: Orthopaedic Surgery | Admitting: Orthopaedic Surgery

## 2023-02-13 ENCOUNTER — Ambulatory Visit (HOSPITAL_BASED_OUTPATIENT_CLINIC_OR_DEPARTMENT_OTHER): Payer: Medicare HMO | Admitting: Anesthesiology

## 2023-02-13 ENCOUNTER — Ambulatory Visit (HOSPITAL_COMMUNITY): Payer: Medicare HMO | Admitting: Physician Assistant

## 2023-02-13 ENCOUNTER — Observation Stay (HOSPITAL_COMMUNITY): Payer: Medicare HMO

## 2023-02-13 DIAGNOSIS — M1712 Unilateral primary osteoarthritis, left knee: Secondary | ICD-10-CM | POA: Diagnosis not present

## 2023-02-13 DIAGNOSIS — Z6834 Body mass index (BMI) 34.0-34.9, adult: Secondary | ICD-10-CM | POA: Diagnosis not present

## 2023-02-13 DIAGNOSIS — Z96652 Presence of left artificial knee joint: Secondary | ICD-10-CM | POA: Diagnosis not present

## 2023-02-13 DIAGNOSIS — I2510 Atherosclerotic heart disease of native coronary artery without angina pectoris: Secondary | ICD-10-CM | POA: Diagnosis not present

## 2023-02-13 DIAGNOSIS — Z96651 Presence of right artificial knee joint: Secondary | ICD-10-CM | POA: Insufficient documentation

## 2023-02-13 DIAGNOSIS — E785 Hyperlipidemia, unspecified: Secondary | ICD-10-CM | POA: Diagnosis not present

## 2023-02-13 DIAGNOSIS — N183 Chronic kidney disease, stage 3 unspecified: Secondary | ICD-10-CM | POA: Diagnosis not present

## 2023-02-13 DIAGNOSIS — Z79899 Other long term (current) drug therapy: Secondary | ICD-10-CM | POA: Insufficient documentation

## 2023-02-13 DIAGNOSIS — Z7982 Long term (current) use of aspirin: Secondary | ICD-10-CM | POA: Diagnosis not present

## 2023-02-13 DIAGNOSIS — Z7984 Long term (current) use of oral hypoglycemic drugs: Secondary | ICD-10-CM | POA: Diagnosis not present

## 2023-02-13 DIAGNOSIS — Z471 Aftercare following joint replacement surgery: Secondary | ICD-10-CM | POA: Diagnosis not present

## 2023-02-13 DIAGNOSIS — E1122 Type 2 diabetes mellitus with diabetic chronic kidney disease: Secondary | ICD-10-CM | POA: Insufficient documentation

## 2023-02-13 DIAGNOSIS — I10 Essential (primary) hypertension: Secondary | ICD-10-CM

## 2023-02-13 DIAGNOSIS — M25462 Effusion, left knee: Secondary | ICD-10-CM | POA: Diagnosis not present

## 2023-02-13 DIAGNOSIS — I129 Hypertensive chronic kidney disease with stage 1 through stage 4 chronic kidney disease, or unspecified chronic kidney disease: Secondary | ICD-10-CM | POA: Diagnosis not present

## 2023-02-13 DIAGNOSIS — G8918 Other acute postprocedural pain: Secondary | ICD-10-CM | POA: Diagnosis not present

## 2023-02-13 DIAGNOSIS — E669 Obesity, unspecified: Secondary | ICD-10-CM | POA: Diagnosis not present

## 2023-02-13 DIAGNOSIS — R7309 Other abnormal glucose: Secondary | ICD-10-CM

## 2023-02-13 HISTORY — PX: TOTAL KNEE ARTHROPLASTY: SHX125

## 2023-02-13 LAB — GLUCOSE, CAPILLARY
Glucose-Capillary: 91 mg/dL (ref 70–99)
Glucose-Capillary: 110 mg/dL — ABNORMAL HIGH (ref 70–99)

## 2023-02-13 SURGERY — ARTHROPLASTY, KNEE, TOTAL
Anesthesia: General | Site: Knee | Laterality: Left

## 2023-02-13 MED ORDER — CEFAZOLIN SODIUM-DEXTROSE 2-4 GM/100ML-% IV SOLN
2.0000 g | INTRAVENOUS | Status: AC
  Administered 2023-02-13: 2 g via INTRAVENOUS
  Filled 2023-02-13: qty 100

## 2023-02-13 MED ORDER — LACTATED RINGERS IV SOLN: INTRAVENOUS | Status: DC

## 2023-02-13 MED ORDER — EPHEDRINE SULFATE (PRESSORS) 50 MG/ML IJ SOLN
INTRAMUSCULAR | Status: DC | PRN
  Administered 2023-02-13 (×3): 5 mg via INTRAVENOUS

## 2023-02-13 MED ORDER — SUGAMMADEX SODIUM 200 MG/2ML IV SOLN
INTRAVENOUS | Status: DC | PRN
  Administered 2023-02-13: 400 mg via INTRAVENOUS

## 2023-02-13 MED ORDER — DROPERIDOL 2.5 MG/ML IJ SOLN: 0.6250 mg | Freq: Once | INTRAMUSCULAR | Status: DC | PRN

## 2023-02-13 MED ORDER — ORAL CARE MOUTH RINSE: 15.0000 mL | Freq: Once | OROMUCOSAL | Status: AC

## 2023-02-13 MED ORDER — ASPIRIN 81 MG PO CHEW
81.0000 mg | CHEWABLE_TABLET | Freq: Two times a day (BID) | ORAL | Status: DC
  Administered 2023-02-13 – 2023-02-16 (×6): 81 mg via ORAL
  Filled 2023-02-13 (×6): qty 1

## 2023-02-13 MED ORDER — SODIUM CHLORIDE 0.9 % IR SOLN
Status: DC | PRN
  Administered 2023-02-13: 1000 mL

## 2023-02-13 MED ORDER — CEFAZOLIN SODIUM-DEXTROSE 1-4 GM/50ML-% IV SOLN
1.0000 g | Freq: Four times a day (QID) | INTRAVENOUS | Status: AC
  Administered 2023-02-13 (×2): 1 g via INTRAVENOUS
  Filled 2023-02-13 (×2): qty 50

## 2023-02-13 MED ORDER — FENTANYL CITRATE PF 50 MCG/ML IJ SOSY
100.0000 ug | PREFILLED_SYRINGE | Freq: Once | INTRAMUSCULAR | Status: AC
  Administered 2023-02-13: 50 ug via INTRAVENOUS
  Filled 2023-02-13 (×2): qty 2

## 2023-02-13 MED ORDER — ONDANSETRON HCL 4 MG PO TABS: 4.0000 mg | ORAL_TABLET | Freq: Four times a day (QID) | ORAL | Status: DC | PRN

## 2023-02-13 MED ORDER — SODIUM CHLORIDE 0.9 % IV SOLN: INTRAVENOUS | Status: DC

## 2023-02-13 MED ORDER — ATORVASTATIN CALCIUM 40 MG PO TABS
40.0000 mg | ORAL_TABLET | Freq: Every day | ORAL | Status: DC
  Administered 2023-02-14 – 2023-02-16 (×3): 40 mg via ORAL
  Filled 2023-02-13 (×3): qty 1

## 2023-02-13 MED ORDER — MENTHOL 3 MG MT LOZG: 1.0000 | LOZENGE | OROMUCOSAL | Status: DC | PRN

## 2023-02-13 MED ORDER — STERILE WATER FOR IRRIGATION IR SOLN
Status: DC | PRN
  Administered 2023-02-13: 2000 mL

## 2023-02-13 MED ORDER — HYDROMORPHONE HCL 1 MG/ML IJ SOLN
INTRAMUSCULAR | Status: AC
  Filled 2023-02-13: qty 1

## 2023-02-13 MED ORDER — ONDANSETRON HCL 4 MG/2ML IJ SOLN: 4.0000 mg | Freq: Four times a day (QID) | INTRAMUSCULAR | Status: DC | PRN

## 2023-02-13 MED ORDER — METOCLOPRAMIDE HCL 5 MG PO TABS: 5.0000 mg | ORAL_TABLET | Freq: Three times a day (TID) | ORAL | Status: DC | PRN

## 2023-02-13 MED ORDER — ONDANSETRON HCL 4 MG/2ML IJ SOLN
INTRAMUSCULAR | Status: DC | PRN
Start: 2023-02-13 — End: 2023-02-13
  Administered 2023-02-13: 4 mg via INTRAVENOUS

## 2023-02-13 MED ORDER — ROCURONIUM BROMIDE 100 MG/10ML IV SOLN
INTRAVENOUS | Status: DC | PRN
  Administered 2023-02-13: 60 mg via INTRAVENOUS

## 2023-02-13 MED ORDER — ROCURONIUM BROMIDE 10 MG/ML (PF) SYRINGE
PREFILLED_SYRINGE | INTRAVENOUS | Status: AC
  Filled 2023-02-13: qty 10

## 2023-02-13 MED ORDER — PHENOL 1.4 % MT LIQD: 1.0000 | OROMUCOSAL | Status: DC | PRN

## 2023-02-13 MED ORDER — HYDROMORPHONE HCL 1 MG/ML IJ SOLN
0.2500 mg | INTRAMUSCULAR | Status: DC | PRN
  Administered 2023-02-13: 0.5 mg via INTRAVENOUS
  Administered 2023-02-13: 0.25 mg via INTRAVENOUS
  Administered 2023-02-13: 0.5 mg via INTRAVENOUS

## 2023-02-13 MED ORDER — PANTOPRAZOLE SODIUM 40 MG PO TBEC
40.0000 mg | DELAYED_RELEASE_TABLET | Freq: Every day | ORAL | Status: DC
  Administered 2023-02-13 – 2023-02-16 (×4): 40 mg via ORAL
  Filled 2023-02-13 (×4): qty 1

## 2023-02-13 MED ORDER — OXYCODONE HCL 5 MG PO TABS: 5.0000 mg | ORAL_TABLET | Freq: Once | ORAL | Status: DC | PRN

## 2023-02-13 MED ORDER — OXYCODONE HCL 5 MG PO TABS
10.0000 mg | ORAL_TABLET | ORAL | Status: DC | PRN
  Administered 2023-02-15: 10 mg via ORAL
  Filled 2023-02-13 (×3): qty 2

## 2023-02-13 MED ORDER — BUPIVACAINE-EPINEPHRINE 0.25% -1:200000 IJ SOLN
INTRAMUSCULAR | Status: DC | PRN
  Administered 2023-02-13: 30 mL

## 2023-02-13 MED ORDER — ACETAMINOPHEN 500 MG PO TABS: 1000.0000 mg | ORAL_TABLET | Freq: Once | ORAL | Status: DC

## 2023-02-13 MED ORDER — FENTANYL CITRATE (PF) 100 MCG/2ML IJ SOLN
INTRAMUSCULAR | Status: DC | PRN
  Administered 2023-02-13: 100 ug via INTRAVENOUS

## 2023-02-13 MED ORDER — ONDANSETRON HCL 4 MG/2ML IJ SOLN
INTRAMUSCULAR | Status: AC
  Filled 2023-02-13: qty 2

## 2023-02-13 MED ORDER — CHLORHEXIDINE GLUCONATE 0.12 % MT SOLN
15.0000 mL | Freq: Once | OROMUCOSAL | Status: AC
  Administered 2023-02-13: 15 mL via OROMUCOSAL

## 2023-02-13 MED ORDER — GABAPENTIN 400 MG PO CAPS
800.0000 mg | ORAL_CAPSULE | Freq: Three times a day (TID) | ORAL | Status: DC
  Administered 2023-02-13 – 2023-02-14 (×4): 800 mg via ORAL
  Filled 2023-02-13 (×4): qty 2

## 2023-02-13 MED ORDER — DOCUSATE SODIUM 100 MG PO CAPS
100.0000 mg | ORAL_CAPSULE | Freq: Two times a day (BID) | ORAL | Status: DC
  Administered 2023-02-13 – 2023-02-16 (×6): 100 mg via ORAL
  Filled 2023-02-13 (×6): qty 1

## 2023-02-13 MED ORDER — OXYCODONE HCL 5 MG/5ML PO SOLN: 5.0000 mg | Freq: Once | ORAL | Status: DC | PRN

## 2023-02-13 MED ORDER — GLYCOPYRROLATE 0.2 MG/ML IJ SOLN
INTRAMUSCULAR | Status: AC
  Filled 2023-02-13: qty 1

## 2023-02-13 MED ORDER — MIDAZOLAM HCL 2 MG/2ML IJ SOLN: 2.0000 mg | Freq: Once | INTRAMUSCULAR | Status: DC

## 2023-02-13 MED ORDER — LISINOPRIL 20 MG PO TABS
40.0000 mg | ORAL_TABLET | Freq: Every day | ORAL | Status: DC
  Administered 2023-02-13 – 2023-02-16 (×4): 40 mg via ORAL
  Filled 2023-02-13 (×4): qty 2

## 2023-02-13 MED ORDER — HYDROCHLOROTHIAZIDE 25 MG PO TABS
25.0000 mg | ORAL_TABLET | Freq: Every day | ORAL | Status: DC
  Administered 2023-02-13 – 2023-02-16 (×4): 25 mg via ORAL
  Filled 2023-02-13 (×4): qty 1

## 2023-02-13 MED ORDER — HYDROMORPHONE HCL 1 MG/ML IJ SOLN
INTRAMUSCULAR | Status: AC
  Administered 2023-02-13: 0.25 mg via INTRAVENOUS
  Filled 2023-02-13: qty 1

## 2023-02-13 MED ORDER — PHENYLEPHRINE HCL-NACL 20-0.9 MG/250ML-% IV SOLN
INTRAVENOUS | Status: DC | PRN
  Administered 2023-02-13: 40 ug/min via INTRAVENOUS

## 2023-02-13 MED ORDER — DEXAMETHASONE SODIUM PHOSPHATE 4 MG/ML IJ SOLN
INTRAMUSCULAR | Status: DC | PRN
  Administered 2023-02-13: 5 mg via PERINEURAL

## 2023-02-13 MED ORDER — 0.9 % SODIUM CHLORIDE (POUR BTL) OPTIME
TOPICAL | Status: DC | PRN
  Administered 2023-02-13: 1000 mL

## 2023-02-13 MED ORDER — POVIDONE-IODINE 10 % EX SWAB: 2.0000 | Freq: Once | CUTANEOUS | Status: DC

## 2023-02-13 MED ORDER — FENTANYL CITRATE (PF) 100 MCG/2ML IJ SOLN
INTRAMUSCULAR | Status: AC
  Filled 2023-02-13: qty 2

## 2023-02-13 MED ORDER — PROPOFOL 10 MG/ML IV BOLUS
INTRAVENOUS | Status: DC | PRN
  Administered 2023-02-13: 100 mg via INTRAVENOUS

## 2023-02-13 MED ORDER — GLYCOPYRROLATE 0.2 MG/ML IJ SOLN
INTRAMUSCULAR | Status: DC | PRN
  Administered 2023-02-13: .2 mg via INTRAVENOUS

## 2023-02-13 MED ORDER — METFORMIN HCL 500 MG PO TABS
500.0000 mg | ORAL_TABLET | Freq: Every day | ORAL | Status: DC
  Administered 2023-02-14 – 2023-02-16 (×3): 500 mg via ORAL
  Filled 2023-02-13 (×3): qty 1

## 2023-02-13 MED ORDER — VITAMIN D 25 MCG (1000 UNIT) PO TABS
1000.0000 [IU] | ORAL_TABLET | Freq: Every day | ORAL | Status: DC
  Administered 2023-02-13 – 2023-02-16 (×4): 1000 [IU] via ORAL
  Filled 2023-02-13 (×4): qty 1

## 2023-02-13 MED ORDER — TIZANIDINE HCL 4 MG PO TABS: 2.0000 mg | ORAL_TABLET | Freq: Four times a day (QID) | ORAL | Status: DC | PRN

## 2023-02-13 MED ORDER — DIPHENHYDRAMINE HCL 12.5 MG/5ML PO ELIX: 12.5000 mg | ORAL_SOLUTION | ORAL | Status: DC | PRN

## 2023-02-13 MED ORDER — ACETAMINOPHEN 325 MG PO TABS: 325.0000 mg | ORAL_TABLET | Freq: Four times a day (QID) | ORAL | Status: DC | PRN

## 2023-02-13 MED ORDER — BUPIVACAINE-EPINEPHRINE 0.25% -1:200000 IJ SOLN
INTRAMUSCULAR | Status: AC
  Filled 2023-02-13: qty 1

## 2023-02-13 MED ORDER — PHENYLEPHRINE HCL (PRESSORS) 10 MG/ML IV SOLN
INTRAVENOUS | Status: DC | PRN
Start: 2023-02-13 — End: 2023-02-13
  Administered 2023-02-13: 100 ug via INTRAVENOUS
  Administered 2023-02-13: 80 ug via INTRAVENOUS

## 2023-02-13 MED ORDER — METOCLOPRAMIDE HCL 5 MG/ML IJ SOLN: 5.0000 mg | Freq: Three times a day (TID) | INTRAMUSCULAR | Status: DC | PRN

## 2023-02-13 MED ORDER — CLONIDINE HCL (ANALGESIA) 100 MCG/ML EP SOLN
EPIDURAL | Status: DC | PRN
Start: 2023-02-13 — End: 2023-02-13
  Administered 2023-02-13: 80 ug

## 2023-02-13 MED ORDER — TRANEXAMIC ACID-NACL 1000-0.7 MG/100ML-% IV SOLN
1000.0000 mg | INTRAVENOUS | Status: AC
  Administered 2023-02-13: 1000 mg via INTRAVENOUS
  Filled 2023-02-13: qty 100

## 2023-02-13 MED ORDER — METOPROLOL TARTRATE 25 MG PO TABS
25.0000 mg | ORAL_TABLET | Freq: Two times a day (BID) | ORAL | Status: DC
  Administered 2023-02-13 – 2023-02-16 (×6): 25 mg via ORAL
  Filled 2023-02-13 (×6): qty 1

## 2023-02-13 MED ORDER — PROPOFOL 1000 MG/100ML IV EMUL
INTRAVENOUS | Status: AC
  Filled 2023-02-13: qty 100

## 2023-02-13 MED ORDER — ALUM & MAG HYDROXIDE-SIMETH 200-200-20 MG/5ML PO SUSP: 30.0000 mL | ORAL | Status: DC | PRN

## 2023-02-13 MED ORDER — ROPIVACAINE HCL 5 MG/ML IJ SOLN
INTRAMUSCULAR | Status: DC | PRN
  Administered 2023-02-13: 30 mL via PERINEURAL

## 2023-02-13 MED ORDER — HYDROMORPHONE HCL 1 MG/ML IJ SOLN: 0.5000 mg | INTRAMUSCULAR | Status: DC | PRN

## 2023-02-13 MED ORDER — INSULIN ASPART 100 UNIT/ML IJ SOLN: 0.0000 [IU] | INTRAMUSCULAR | Status: DC | PRN

## 2023-02-13 MED ORDER — OXYCODONE HCL 5 MG PO TABS
5.0000 mg | ORAL_TABLET | ORAL | Status: DC | PRN
  Administered 2023-02-13 – 2023-02-16 (×7): 10 mg via ORAL
  Filled 2023-02-13 (×5): qty 2

## 2023-02-13 MED ORDER — ALBUTEROL SULFATE (2.5 MG/3ML) 0.083% IN NEBU: 3.0000 mL | INHALATION_SOLUTION | Freq: Four times a day (QID) | RESPIRATORY_TRACT | Status: DC | PRN

## 2023-02-13 SURGICAL SUPPLY — 61 items
APL SKNCLS STERI-STRIP NONHPOA (GAUZE/BANDAGES/DRESSINGS)
BAG COUNTER SPONGE SURGICOUNT (BAG) IMPLANT
BAG SPEC THK2 15X12 ZIP CLS (MISCELLANEOUS) ×1
BAG SPNG CNTER NS LX DISP (BAG)
BAG ZIPLOCK 12X15 (MISCELLANEOUS) ×1 IMPLANT
BENZOIN TINCTURE PRP APPL 2/3 (GAUZE/BANDAGES/DRESSINGS) IMPLANT
BLADE SAG 18X100X1.27 (BLADE) ×1 IMPLANT
BLADE SURG SZ10 CARB STEEL (BLADE) ×2 IMPLANT
BNDG CMPR 6 X 5 YARDS HK CLSR (GAUZE/BANDAGES/DRESSINGS) ×2
BNDG ELASTIC 6INX 5YD STR LF (GAUZE/BANDAGES/DRESSINGS) ×2 IMPLANT
BOWL SMART MIX CTS (DISPOSABLE) IMPLANT
BSPLAT TIB 5D E CMNT STM LT (Knees) ×1 IMPLANT
CEMENT BONE R 1X40 (Cement) IMPLANT
CEMENT BONE SIMPLEX SPEEDSET (Cement) IMPLANT
COOLER ICEMAN CLASSIC (MISCELLANEOUS) ×1 IMPLANT
COVER SURGICAL LIGHT HANDLE (MISCELLANEOUS) ×1 IMPLANT
CUFF TOURN SGL QUICK 34 (TOURNIQUET CUFF) ×1
CUFF TRNQT CYL 34X4.125X (TOURNIQUET CUFF) ×1 IMPLANT
DRAPE INCISE IOBAN 66X45 STRL (DRAPES) ×1 IMPLANT
DRAPE U-SHAPE 47X51 STRL (DRAPES) ×1 IMPLANT
DURAPREP 26ML APPLICATOR (WOUND CARE) ×1 IMPLANT
ELECT BLADE TIP CTD 4 INCH (ELECTRODE) ×1 IMPLANT
ELECT REM PT RETURN 15FT ADLT (MISCELLANEOUS) ×1 IMPLANT
FEMORAL KNEE COMP SZ 8 STND LT (Knees) ×1 IMPLANT
FEMORAL KNEE COMP SZ 8STD LT (Knees) IMPLANT
GAUZE PAD ABD 8X10 STRL (GAUZE/BANDAGES/DRESSINGS) ×2 IMPLANT
GAUZE SPONGE 4X4 12PLY STRL (GAUZE/BANDAGES/DRESSINGS) ×1 IMPLANT
GAUZE XEROFORM 1X8 LF (GAUZE/BANDAGES/DRESSINGS) IMPLANT
GLOVE BIO SURGEON STRL SZ7.5 (GLOVE) ×1 IMPLANT
GLOVE BIOGEL PI IND STRL 8 (GLOVE) ×2 IMPLANT
GLOVE ECLIPSE 8.0 STRL XLNG CF (GLOVE) ×1 IMPLANT
GOWN STRL REUS W/ TWL XL LVL3 (GOWN DISPOSABLE) ×2 IMPLANT
GOWN STRL REUS W/TWL XL LVL3 (GOWN DISPOSABLE) ×2
HANDPIECE INTERPULSE COAX TIP (DISPOSABLE) ×1
HOLDER FOLEY CATH W/STRAP (MISCELLANEOUS) IMPLANT
IMMOBILIZER KNEE 20 (SOFTGOODS) ×1
IMMOBILIZER KNEE 20 THIGH 36 (SOFTGOODS) ×1 IMPLANT
KIT TURNOVER KIT A (KITS) IMPLANT
NS IRRIG 1000ML POUR BTL (IV SOLUTION) ×1 IMPLANT
PACK TOTAL KNEE CUSTOM (KITS) ×1 IMPLANT
PAD COLD SHLDR WRAP-ON (PAD) ×1 IMPLANT
PADDING CAST COTTON 6X4 STRL (CAST SUPPLIES) ×2 IMPLANT
PIN DRILL HDLS TROCAR 75 4PK (PIN) IMPLANT
PROTECTOR NERVE ULNAR (MISCELLANEOUS) ×1 IMPLANT
SET HNDPC FAN SPRY TIP SCT (DISPOSABLE) ×1 IMPLANT
SET PAD KNEE POSITIONER (MISCELLANEOUS) ×1 IMPLANT
SPIKE FLUID TRANSFER (MISCELLANEOUS) IMPLANT
STAPLER VISISTAT 35W (STAPLE) IMPLANT
STEM POLY PAT PLY 35M KNEE (Knees) IMPLANT
STEM TIBIA 5 DEG SZ E L KNEE (Knees) IMPLANT
STEM TIBIAL 10 8-11 EF POLY LT (Joint) IMPLANT
STRIP CLOSURE SKIN 1/2X4 (GAUZE/BANDAGES/DRESSINGS) IMPLANT
SUT MNCRL AB 4-0 PS2 18 (SUTURE) IMPLANT
SUT VIC AB 0 CT1 27 (SUTURE) ×1
SUT VIC AB 0 CT1 27XBRD ANTBC (SUTURE) ×1 IMPLANT
SUT VIC AB 1 CT1 36 (SUTURE) ×2 IMPLANT
SUT VIC AB 2-0 CT1 27 (SUTURE) ×2
SUT VIC AB 2-0 CT1 TAPERPNT 27 (SUTURE) ×2 IMPLANT
TIBIA STEM 5 DEG SZ E L KNEE (Knees) ×1 IMPLANT
TRAY FOLEY MTR SLVR 16FR STAT (SET/KITS/TRAYS/PACK) IMPLANT
WATER STERILE IRR 1000ML POUR (IV SOLUTION) ×2 IMPLANT

## 2023-02-13 NOTE — Evaluation (Signed)
Physical Therapy Evaluation Patient Details Name: [name] [name] MRN: [id number] DOB: Apr 21, 1951 Today's Date: 02/13/2023  History of Present Illness  Pt is 72 yo female s/p L TKA on 02/13/23.  Pt with hx including but not limited to R TKA 6/23, ACDF 5/24, arthritis, DDD, CAD, MI, LBP, neuropathy, DM, vertigo, sleep apnea  Clinical Impression  Pt admitted with above diagnosis. At baseline, pt is independent with a cane.  She lives alone.  Her initial plan was to have surgery in June/July so that son could assist while off for the summer but surgery was delayed and son is back at work.  Pt reports plan is to go to rehab at d/c prior to home - states she discussed with Dr. Magnus Ivan.  Today, pt requiring min-mod A for transfers.  She ambulated 15' slowly with RW and min A.  Does have difficulty with terminal knee extension but was able to weight bear on L LE.  Pt expected to progress well with therapy. Pt currently with functional limitations due to the deficits listed below (see PT Problem List). Pt will benefit from acute skilled PT to increase their independence and safety with mobility to allow discharge.           If plan is discharge home, recommend the following: A little help with walking and/or transfers;A little help with bathing/dressing/bathroom;Assistance with cooking/housework;Help with stairs or ramp for entrance   Can travel by private vehicle        Equipment Recommendations None recommended by PT  Recommendations for Other Services       Functional Status Assessment Patient has had a recent decline in their functional status and demonstrates the ability to make significant improvements in function in a reasonable and predictable amount of time.     Precautions / Restrictions Precautions Precautions: Fall;Knee Required Braces or Orthoses: Knee Immobilizer - Left Knee Immobilizer - Left: Discontinue once straight leg raise with < 10 degree lag Restrictions Weight Bearing  Restrictions: Yes LLE Weight Bearing: Weight bearing as tolerated      Mobility  Bed Mobility Overal bed mobility: Needs Assistance Bed Mobility: Supine to Sit     Supine to sit: Min assist          Transfers Overall transfer level: Needs assistance Equipment used: Rolling walker (2 wheels) Transfers: Sit to/from Stand Sit to Stand: Mod assist, Min assist           General transfer comment: Cues for hand placement and L LE management; mod A from bed and min A from [facility]    Ambulation/Gait Ambulation/Gait assistance: Min assist Gait Distance (Feet): 12 Feet Assistive device: Rolling walker (2 wheels) Gait Pattern/deviations: Step-to pattern, Decreased stride length, Knee flexed in stance - left, Decreased weight shift to left Gait velocity: decreased     General Gait Details: Cues for sequencing and trying to straighten L knee as able.  Fatigued easily  Acupuncturist Bed    Modified Rankin (Stroke Patients Only)       Balance Overall balance assessment: Needs assistance Sitting-balance support: No upper extremity supported Sitting balance-Leahy Scale: Good     Standing balance support: Bilateral upper extremity supported, Reliant on assistive device for balance Standing balance-Leahy Scale: Poor  Pertinent Vitals/Pain Pain Assessment Pain Assessment: 0-10 Pain Score: 3  Pain Location: L knee Pain Descriptors / Indicators: Discomfort, Sore Pain Intervention(s): Limited activity within patient's tolerance, Monitored during session, Premedicated before session, Repositioned, Ice applied    Home Living Family/patient expects to be discharged to:: Skilled nursing facility (From home alone) Living Arrangements: Alone Available Help at Discharge: Family;Available PRN/intermittently Type of Home: House Home Access: Stairs to enter   Entrance Stairs-Number of Steps: 1    Home Layout: One level Home Equipment: Agricultural consultant (2 wheels);Cane - single point;Toilet riser Additional Comments: Reports initial plan to have surgery in July when son off for summer (works in school system) but since delayed does not have support so planning SNF    Prior Function Prior Level of Function : Independent/Modified Independent             Mobility Comments: Pt able to ambulate in community with increased time; recently using cane ADLs Comments: independent with adls and iadls     Extremity/Trunk Assessment   Upper Extremity Assessment Upper Extremity Assessment: Overall WFL for tasks assessed    Lower Extremity Assessment Lower Extremity Assessment: LLE deficits/detail;RLE deficits/detail RLE Deficits / Details: ROM WFL; MMT 5/5 LLE Deficits / Details: Expected post op changes; ROM ankle/hip WFL, knee 8 to 60 degrees; MMT: ankle 5/5, hip and knee 3/5 not further tested    Cervical / Trunk Assessment Cervical / Trunk Assessment: Normal Cervical / Trunk Exceptions: Recent ACDF 5/24  Communication      Cognition Arousal: Alert Behavior During Therapy: [facility] for tasks assessed/performed Overall Cognitive Status: Within Functional Limits for tasks assessed                                          General Comments      Exercises     Assessment/Plan    PT Assessment Patient needs continued PT services  PT Problem List Decreased strength;Pain;Decreased range of motion;Decreased activity tolerance;Decreased balance;Decreased mobility;Decreased knowledge of precautions;Decreased knowledge of use of DME       PT Treatment Interventions DME instruction;Therapeutic exercise;Gait training;Balance training;Stair training;Modalities;Functional mobility training;Therapeutic activities;Patient/family education    PT Goals (Current goals can be found in the Care Plan section)  Acute Rehab PT Goals Patient Stated Goal: return home after rehab PT  Goal Formulation: With patient/family Time For Goal Achievement: 02/27/23 Potential to Achieve Goals: Good    Frequency 7X/week     Co-evaluation               AM-PAC PT "6 Clicks" Mobility  Outcome Measure Help needed turning from your back to your side while in a flat bed without using bedrails?: A Little Help needed moving from lying on your back to sitting on the side of a flat bed without using bedrails?: A Little Help needed moving to and from a bed to a chair (including a wheelchair)?: A Little Help needed standing up from a chair using your arms (e.g., wheelchair or bedside chair)?: A Lot Help needed to walk in hospital room?: A Lot Help needed climbing 3-5 steps with a railing? : Total 6 Click Score: 14    End of Session Equipment Utilized During Treatment: Gait belt Activity Tolerance: Patient limited by lethargy Patient left: with chair alarm set;in chair;with call bell/phone within reach (ice cuff resumed) Nurse Communication: Mobility status PT Visit Diagnosis: Other abnormalities of gait and  mobility (R26.89);Muscle weakness (generalized) (M62.81)    Time: 3710-6269 PT Time Calculation (min) (ACUTE ONLY): 32 min   Charges:   PT Evaluation $PT Eval Low Complexity: 1 Low PT Treatments $Gait Training: 8-22 mins PT General Charges $$ ACUTE PT VISIT: 1 Visit         Anise Salvo, PT Acute Rehab [facility] [phone]   Rayetta Humphrey 02/13/2023, 5:57 PM

## 2023-02-13 NOTE — Anesthesia Procedure Notes (Signed)
Procedure Name: Intubation Date/Time: 02/13/2023 12:27 PM  Performed by: Ahmed Prima, CRNAPre-anesthesia Checklist: Patient identified, Emergency Drugs available, Suction available and Patient being monitored Patient Re-evaluated:Patient Re-evaluated prior to induction Oxygen Delivery Method: Circle system utilized Preoxygenation: Pre-oxygenation with 100% oxygen Induction Type: IV induction Ventilation: Oral airway inserted - appropriate to patient size and Two handed mask ventilation required Laryngoscope Size: 3, Glidescope and Mac Grade View: Grade I Tube type: Oral Tube size: 7.0 mm Number of attempts: 1 Airway Equipment and Method: Stylet and Oral airway Placement Confirmation: ETT inserted through vocal cords under direct vision, positive ETCO2 and breath sounds checked- equal and bilateral Tube secured with: Tape Dental Injury: Teeth and Oropharynx as per pre-operative assessment

## 2023-02-13 NOTE — Interval H&P Note (Signed)
History and Physical Interval Note: The patient understands that she is here today for a left knee replacement to treat her severe left knee arthritis.  There has been no acute or interval change in her medical status.  The risks and benefits of surgery been discussed in detail and informed consent has been obtained.  The left operative knee has been marked.  02/13/2023 11:25 AM  Kathi Ludwig  has presented today for surgery, with the diagnosis of osteoarthritis left knee.  The various methods of treatment have been discussed with the patient and family. After consideration of risks, benefits and other options for treatment, the patient has consented to  Procedure(s): LEFT TOTAL KNEE ARTHROPLASTY (Left) as a surgical intervention.  The patient's history has been reviewed, patient examined, no change in status, stable for surgery.  I have reviewed the patient's chart and labs.  Questions were answered to the patient's satisfaction.     Kathryne Hitch

## 2023-02-13 NOTE — Anesthesia Preprocedure Evaluation (Addendum)
Anesthesia Evaluation  Patient identified by MRN, date of birth, ID band Patient awake    Reviewed: Allergy & Precautions, NPO status , Patient's Chart, lab work & pertinent test results  Airway Mallampati: III  TM Distance: >3 FB Neck ROM: Full    Dental  (+) Edentulous Upper, Edentulous Lower, Dental Advisory Given   Pulmonary shortness of breath, sleep apnea , pneumonia   breath sounds clear to auscultation       Cardiovascular hypertension, Pt. on medications + CAD and + Past MI  + Valvular Problems/Murmurs AS  Rhythm:Regular Rate:Normal + Systolic murmurs Echo 03/20/22 [facility]): Summary  1. The left ventricle is normal in size with mildly increased wall thickness.  2. The left ventricular systolic function is normal, LVEF is visually estimated at 65-70%.  3. There is grade I diastolic dysfunction (impaired relaxation).  4. There is mild aortic valve stenosis.    Peak AV transvalvular velocity:  2.1 m/s.    Mean gradient: 10 mmHg.    Doppler velocity index: 0.72.    Estimated aortic valve area (VTI): 1.5 cm2.    Estimated aortic valve area (velocity): 1.6 cm2.    LVOT diameter: 1.7 cm.    LV stroke volume index: 31.8 ml/m2.    5. The left atrium is moderately dilated in size.    6. The right ventricle is normal in size, with normal systolic function.    Nuclear stress test 12/28/20 [facility]): Impression: 1. No reversible ischemia or infarction.  2. Normal left ventricular wall motion.  3. Left ventricular ejection fraction 65%  4. Non invasive risk stratification*: Low  *2012 Appropriate Use Criteria for Coronary Revascularization  Focused Update: J Am Coll Cardiol. 2012;59(9):857-881.  http://content.dementiazones.com.aspx?[email or web address]     Echo: - Left ventricle: The cavity size was normal. Wall thickness was  increased in a pattern of mild LVH. Systolic function was  vigorous. The estimated ejection  fraction was in the range of 65%  to 70%. Wall motion was normal; there were no regional wall  motion abnormalities. Doppler parameters are consistent with  abnormal left ventricular relaxation (grade 1 diastolic  dysfunction).  - Aortic valve: Trileaflet; mildly calcified leaflets. Left  coronary cusp mobility was mildly restricted.  - Mitral valve: Heavily calcified annulus. There was trivial  regurgitation.  - Left atrium: The atrium was mildly dilated.  - Right atrium: Central venous pressure (est): 3 mm Hg.  - Atrial septum: No defect or patent foramen ovale was identified.  - Tricuspid valve: There was trivial regurgitation.  - Pulmonary arteries: PA peak pressure: 25 mm Hg (S).  - Pericardium, extracardiac: There was no pericardial effusion.    Neuro/Psych  PSYCHIATRIC DISORDERS  Depression       GI/Hepatic Neg liver ROS,GERD  Medicated,,  Endo/Other  diabetes, Type 2, Oral Hypoglycemic Agents    Renal/GU CRFRenal disease     Musculoskeletal  (+) Arthritis ,    Abdominal Normal abdominal exam  (+)   Peds  Hematology  (+) Blood dyscrasia, anemia   Anesthesia Other Findings    Reproductive/Obstetrics                             Anesthesia Physical Anesthesia Plan  ASA: 4  Anesthesia Plan: General   Post-op Pain Management: Tylenol PO (pre-op)* and Regional block*   Induction: Intravenous  PONV Risk Score and Plan: 4 or greater and Ondansetron, Dexamethasone, Midazolam and Treatment may vary due to  age or medical condition  Airway Management Planned: Oral ETT and Video Laryngoscope Planned  Additional Equipment: None  Intra-op Plan:   Post-operative Plan: Extubation in OR  Informed Consent: I have reviewed the patients History and Physical, chart, labs and discussed the procedure including the risks, benefits and alternatives for the proposed anesthesia with the patient or authorized representative who has indicated  his/her understanding and acceptance.     Dental advisory given  Plan Discussed with: CRNA  Anesthesia Plan Comments: (PAT note by Antionette Poles, PA-C [id number]/7533 72 year old female with pertinent history including GERD, HTN, sleep apnea, CKD Stage II, DM II, CAD (NSTEMI, s/p cutting balloon atherectomy/DES [facility] 2004).  Last heart cath November 2016 showed widely patent RCA stent with otherwise no significant disease in the LAD and left circumflex.  Echo in October 2016 showed EF 65 to 70%, grade 1 DD, no significant valvular abnormalities.  Pt had nuclear stress test ordered by PCP in June of 2022 for preop eval. Test done 12/28/20 at [facility] showed no reversible ischemia or infarction, EF 65%, low risk. Pt did subsequently undergo L#-4 TLIF 01/31/21 and  R TKA 12/16/21 without complication.   Patient states she has not seen cardiology since 2016.  Chronic medical conditions are followed by her PCP Dr. [facility].  Reviewed last PCP note 07/13/2022, primary concern at that time was balance issues and difficulty with manual dexterity of fingers.  She was ultimately found to have severe cervical spine stenosis.  C-spine MR 09/29/2022 severe cervical spine stenosis at C3-4 and C4-5 with T2 hyperintense signal abnormality in the cervical spinal cord at the C3-C4 level, favored to represent myelomalacia.  Various prior notes in epic have mentioned the patient is s/p aortic valve replacement.  Patient denies this; states she has never had any cardiac intervention other than stent in 2004.  Cardiology notes from 2016 did not mention any history of valve replacement.  PCP notes also did not mention history of aortic valve replacement.  Echocardiogram does not mention history of aortic valve replacement. Clearly I do not think she has had an AVR; this appears to have been a mistake in charting.   NIDDM2, well controlled, A1c 6.1 on preop labs.   Preop labs reviewed, creatinine  mildly elevated 1.18 c/w hx of CKD II, otherwise unremarkable.   EKG 11/14/22: Sinus bradycardia. Rate 57. Left ventricular hypertrophy with repolarization abnormality ( R in aVL , Cornell product )  Nuclear Stress 12/28/2020: IMPRESSION: 1. No reversible ischemia or infarction.  2. Normal left ventricular wall motion.  3. Left ventricular ejection fraction 65%  4. Non invasive risk stratification*: Low   Cardiac Cath 04/28/2015 1. Widely patent RCA stent with no significant obstruction in that vessel. 2. Widely patent left main, LAD, and left circumflex 3. Normal LV function by echo 4. Normal LVEDP  Suspect noncardiac symptoms.  Echo 04/01/2015 Study Conclusions   - Left ventricle: The cavity size was normal. Wall thickness was  increased in a pattern of mild LVH. Systolic function was  vigorous. The estimated ejection fraction was in the range of 65%  to 70%. Wall motion was normal; there were no regional wall  motion abnormalities. Doppler parameters are consistent with  abnormal left ventricular relaxation (grade 1 diastolic  dysfunction).  - Aortic valve: Trileaflet; mildly calcified leaflets. Left  coronary cusp mobility was mildly restricted.  - Mitral valve: Heavily calcified annulus. There was trivial  regurgitation.  - Left atrium: The atrium  was mildly dilated.  - Right atrium: Central venous pressure (est): 3 mm Hg.  - Atrial septum: No defect or patent foramen ovale was identified.  - Tricuspid valve: There was trivial regurgitation.  - Pulmonary arteries: PA peak pressure: 25 mm Hg (S).  - Pericardium, extracardiac: There was no pericardial effusion.    )        Anesthesia Quick Evaluation

## 2023-02-13 NOTE — Op Note (Signed)
Operative Note  Date of operation: 02/13/2023 Operative diagnosis: Left knee primary osteoarthritis Postoperative diagnosis: Same  Procedure: Left cemented total knee arthroplasty  Implants: Biomet/Zimmer persona cemented knee system Implant Name Type Inv. Item Serial No. Manufacturer Lot No. LRB No. Used Action  CEMENT BONE R 1X40 - [id number] Cement CEMENT BONE R 1X40  ZIMMER RECON(ORTH,TRAU,BIO,SG) [id number] Left 2 Implanted  STEM POLY PAT PLY 44M KNEE - [id number] Knees STEM POLY PAT PLY 44M KNEE  ZIMMER RECON(ORTH,TRAU,BIO,SG) [id number] Left 1 Implanted  BSPLAT TIB 5D E CMNT STM LT - [id number] Knees BSPLAT TIB 5D E CMNT STM LT  ZIMMER RECON(ORTH,TRAU,BIO,SG) [id number] Left 1 Implanted  FEMORAL KNEE COMP SZ 8 STND LT - [id number] Knees FEMORAL KNEE COMP SZ 8 STND LT  ZIMMER RECON(ORTH,TRAU,BIO,SG) [id number] Left 1 Implanted  STEM TIBIAL 10 8-11 EF POLY LT - [id number] Joint STEM TIBIAL 10 8-11 EF POLY LT  ZIMMER RECON(ORTH,TRAU,BIO,SG) [id number] Left 1 Implanted   Surgeon: Vanita Panda. Magnus Ivan, MD Assistant: Rexene Edison, PA-C  Anesthesia: #1 left lower extremity adductor canal block, #2 General, #3 local Tourniquet time: Under 1 hour EBL: Less than 100 cc Antibiotics: IV Ancef Complications: None  Indications: The patient is a 72 year old female with worsening arthritis involving her left knee.  It become debilitating and is detrimentally affecting her mobility, her quality of life and her actives of daily living.  We have actually replaced her right knee successfully before and has done well.  At this point given the continued pain she has with her left knee combined with the better conservative treatment and severe arthritis, if she wishes to proceed with a total knee arthroplasty on the left side.  Having had this before she is a fully aware of the risk of acute blood loss anemia, nerve or vessel injury, fracture, infection, DVT, implant failure and wound healing issues.  She  understands how her goals are hopefully increased mobility, improve quality of life and decrease pain.  Procedure description: After informed consent was obtained and the appropriate left knee was marked, anesthesia obtained a left lower extremity adductor canal block in the holding room.  The patient was then brought to the operating room and placed on the operating table where general anesthesia was obtained.  A nonsterile drain was placed around her upper left thigh and her left thigh, knee, leg and ankle were prepped and draped with DuraPrep and sterile drapes including a sterile stockinette.  A timeout was called and she identifies correct patient correct left knee.  An Esmarch was then used to wrap out the leg and the tourniquet was inflated to 300 mm of pressure.  With the knee extended a direct midline incision was made over the patella and carried proximally distally.  Dissection was carried down to the knee joint and a medial parapatellar arthrotomy was made.  A moderate joint effusion was encountered.  We then flexed the knee to remove remnants of the ACL as well as medial lateral meniscus.  Osteophytes removed from all 3 compartments.  We did find significant cartilage wear.  We then started on the proximal tibia aspect of things.  We used a proximal tibia extramedullary cutting guide for making her proximal tibia cut correction for varus and valgus and 5 degree slope.  We made this cut without difficulty taking 2 mm off the low side but we did back down to more millimeters.  We then used a intramedullary cutting guide for distal femoral cut setting this for left knee at 5 degrees  externally rotated and a 10 mm distal femoral cut.  We made that cut did also back to down to more millimeters.  With a 10 mm extension block to the knee extended her extension was full.  We removed more remnants of the meniscus from the back of the knee and flex the knee again.  We placed a femoral sizing guide based off the  epicondylar axis and based off of this we chose a size 9 femur.  We put a 4-in-1 cutting block for a size 8 femur and made her anterior and posterior cuts provide chamfer cuts.  We then backed the tibia and chose a size E tibial tray setting the rotation of the tibial tubercle and the femur.  This was for the left knee.  We made our keel punch and drill hole for this.  We then trialed our tibial tray followed by our size 8 left standard CR femur.  We placed a 10 mm medial congruent left polythene insert.  We are pleased with range of motion and stability of the knee without insert and components.  We then made a patella cut and drilled 3 holes for size 35 patella down.  Again with all trial instrumentation the knee we are pleased with range of motion and stability.  All transportation was then removed and we irrigated knee with normal saline solution using pulsatile lavage.  Local with Marcaine and epinephrine was then placed around the arthrotomy.  With the left knee flexed we then mixed our cement and cemented our Biomet/Zimmer tibial tray for a left knee size E followed by cementing our size 8 left CR standard femur.  We placed our 10 mm thickness left medial congruent polyethylene insert and cemented our 35 mm patella.  We then held the knee fully extended and compressed while the cement hardened.  Excess cement debris was removed from the knee.  Once the cemented hardened the tourniquet was let down and hemostasis was obtained with electrocautery.  The arthrotomy was then closed with interrupted #1 Vicryl suture followed by 0 Vicryl close deep tissue and 2-0 Vicryl to close subcutaneous tissue.  The skin was closed with staples.  Well-padded sterile dressings applied.  Patient was taken to the recovery room in stable condition.  Rexene Edison, PA-C did assist during the entire case and beginning the end his assistance of medical necessary and crucial for soft tissue management and retraction, helping guide implant  placement and a layered closure of the wound.

## 2023-02-13 NOTE — Anesthesia Procedure Notes (Signed)
Anesthesia Regional Block: Adductor canal block   Pre-Anesthetic Checklist: , timeout performed,  Correct Patient, Correct Site, Correct Laterality,  Correct Procedure, Correct Position, site marked,  Risks and benefits discussed,  Surgical consent,  Pre-op evaluation,  At surgeon's request and post-op pain management  Laterality: Lower and Left  Prep: chloraprep       Needles:  Injection technique: Single-shot  Needle Type: Stimiplex     Needle Length: 9cm  Needle Gauge: 21     Additional Needles:   Procedures:,,,, ultrasound used (permanent image in chart),,    Narrative:  Start time: 02/13/2023 11:34 AM End time: 02/13/2023 11:54 AM Injection made incrementally with aspirations every 5 mL.  Performed by: Personally  Anesthesiologist: Lewie Loron, MD  Additional Notes: BP cuff, EKG monitors applied. Sedation begun. Artery and nerve location verified with ultrasound. Anesthetic injected incrementally (5ml), slowly, and after negative aspirations under direct u/s guidance. Good fascial/perineural spread. Tolerated well.

## 2023-02-13 NOTE — Plan of Care (Signed)
  Problem: Education: Goal: Knowledge of the prescribed therapeutic regimen will improve Outcome: Progressing   Problem: Activity: Goal: Range of joint motion will improve Outcome: Progressing   Problem: Pain Management: Goal: Pain level will decrease with appropriate interventions Outcome: Progressing   Problem: Education: Goal: Knowledge of General Education information will improve Description: Including pain rating scale, medication(s)/side effects and non-pharmacologic comfort measures Outcome: Progressing   Problem: Clinical Measurements: Goal: Ability to maintain clinical measurements within normal limits will improve Outcome: Progressing   Problem: Safety: Goal: Ability to remain free from injury will improve Outcome: Progressing

## 2023-02-13 NOTE — Transfer of Care (Signed)
Immediate Anesthesia Transfer of Care Note  Patient: [name] [name]  Procedure(s) Performed: LEFT TOTAL KNEE ARTHROPLASTY (Left: Knee)  Patient Location: PACU  Anesthesia Type:General  Level of Consciousness: drowsy  Airway & Oxygen Therapy: Breathing spontaneously with a non-rebreather.  Post-op Assessment: Report given to RN and Post -op Vital signs reviewed and stable  Post vital signs: Reviewed; similar to pre-operative VS.  Last Vitals:  Vitals Value Taken Time  BP 206/103 02/13/23 1407  Temp    Pulse 84 02/13/23 1410  Resp 15 02/13/23 1410  SpO2 98 % 02/13/23 1410  Vitals shown include unfiled device data.  Last Pain:  Vitals:   02/13/23 1200  TempSrc:   PainSc: 0-No pain         Complications: No notable events documented.

## 2023-02-14 ENCOUNTER — Encounter (HOSPITAL_COMMUNITY): Payer: Self-pay | Admitting: Orthopaedic Surgery

## 2023-02-14 DIAGNOSIS — I129 Hypertensive chronic kidney disease with stage 1 through stage 4 chronic kidney disease, or unspecified chronic kidney disease: Secondary | ICD-10-CM | POA: Diagnosis not present

## 2023-02-14 DIAGNOSIS — N183 Chronic kidney disease, stage 3 unspecified: Secondary | ICD-10-CM | POA: Diagnosis not present

## 2023-02-14 DIAGNOSIS — Z96651 Presence of right artificial knee joint: Secondary | ICD-10-CM | POA: Diagnosis not present

## 2023-02-14 DIAGNOSIS — E1122 Type 2 diabetes mellitus with diabetic chronic kidney disease: Secondary | ICD-10-CM | POA: Diagnosis not present

## 2023-02-14 DIAGNOSIS — Z7984 Long term (current) use of oral hypoglycemic drugs: Secondary | ICD-10-CM | POA: Diagnosis not present

## 2023-02-14 DIAGNOSIS — Z7982 Long term (current) use of aspirin: Secondary | ICD-10-CM | POA: Diagnosis not present

## 2023-02-14 DIAGNOSIS — I2510 Atherosclerotic heart disease of native coronary artery without angina pectoris: Secondary | ICD-10-CM | POA: Diagnosis not present

## 2023-02-14 DIAGNOSIS — M1712 Unilateral primary osteoarthritis, left knee: Secondary | ICD-10-CM | POA: Diagnosis not present

## 2023-02-14 DIAGNOSIS — Z79899 Other long term (current) drug therapy: Secondary | ICD-10-CM | POA: Diagnosis not present

## 2023-02-14 LAB — BASIC METABOLIC PANEL
Anion gap: 10 (ref 5–15)
BUN: 23 mg/dL (ref 8–23)
CO2: 23 mmol/L (ref 22–32)
Calcium: 9.2 mg/dL (ref 8.9–10.3)
Chloride: 105 mmol/L (ref 98–111)
Creatinine, Ser: 1.03 mg/dL — ABNORMAL HIGH (ref 0.44–1.00)
GFR, Estimated: 57 mL/min — ABNORMAL LOW (ref >60)
Glucose, Bld: 126 mg/dL — ABNORMAL HIGH (ref 70–99)
Potassium: 3.9 mmol/L (ref 3.5–5.1)
Sodium: 138 mmol/L (ref 135–145)

## 2023-02-14 LAB — CBC
HCT: 33.2 % — ABNORMAL LOW (ref 36.0–46.0)
Hemoglobin: 10.6 g/dL — ABNORMAL LOW (ref 12.0–15.0)
MCH: 29.9 pg (ref 26.0–34.0)
MCHC: 31.9 g/dL (ref 30.0–36.0)
MCV: 93.8 fL (ref 80.0–100.0)
Platelets: 172 10*3/uL (ref 150–400)
RBC: 3.54 MIL/uL — ABNORMAL LOW (ref 3.87–5.11)
RDW: 13.2 % (ref 11.5–15.5)
WBC: 5.9 10*3/uL (ref 4.0–10.5)
nRBC: 0 % (ref 0.0–0.2)

## 2023-02-14 NOTE — Plan of Care (Signed)
  Problem: Education: Goal: Knowledge of the prescribed therapeutic regimen will improve Outcome: Progressing Goal: Individualized Educational Video(s) Outcome: Progressing   Problem: Activity: Goal: Ability to avoid complications of mobility impairment will improve Outcome: Progressing   Problem: Pain Management: Goal: Pain level will decrease with appropriate interventions Outcome: Progressing

## 2023-02-14 NOTE — NC FL2 (Signed)
[place] MEDICAID FL2 LEVEL OF CARE FORM     IDENTIFICATION  Patient Name: [name] [name] Birthdate: [birth year] Sex: female Admission Date (Current Location): 02/13/2023  [facility] and [place] Number:  Producer, television/film/video and Address:  [facility],  [address] [place], [place] [address]      Provider Number: [id number]  Attending Physician Name and Address:  Kathryne Hitch  Relative Name and Phone Number:  son, Samul Dada @ [phone]    Current Level of Care: Hospital Recommended Level of Care: Skilled Nursing Facility Prior Approval Number:    Date Approved/Denied:   PASRR Number: [id number]  Discharge Plan: SNF    Current Diagnoses: Patient Active Problem List   Diagnosis Date Noted   Status post total left knee replacement 02/13/2023   Cervical spinal stenosis 11/23/2022   Unilateral primary osteoarthritis, left knee 08/24/2022   Status post total right knee replacement 12/16/2021   Chronic RLQ pain 04/29/2021   Periumbilical pain 04/29/2021   Spinal stenosis of lumbar region with neurogenic claudication    Spondylolisthesis, lumbar region    Other spondylosis with radiculopathy, lumbar region    Acute postoperative anemia due to expected blood loss 02/01/2021   Fusion of spine of lumbar region 01/31/2021   S/P lumbar fusion L4 to Sacrum 06/22/09 10/11/2020   Myalgia and myositis 06/12/2018   Other and unspecified nonspecific immunological findings 04/25/2018   Osteoarthrosis, hand 10/19/2017   Insomnia 09/23/2015   Plantar fascial fibromatosis 09/23/2015   Shortness of breath 04/28/2015   Abnormal myocardial perfusion study    Disturbance of skin sensation 08/05/2014   Chronic kidney disease, stage III (moderate) (HCC) 02/02/2014   Impaired fasting glucose 02/02/2014   Vitamin D deficiency 10/30/2013   Other osteoporosis 07/29/2012   Myocardial infarction (HCC) 12/29/2010   Cardiovascular disease 05/26/2009   KNEE PAIN,  LEFT 07/02/2008   HIP PAIN 04/27/2008   OBESITY 02/24/2008   HYPERGLYCEMIA, FASTING 02/10/2008   GERD 03/12/2007   ANXIETY DEPRESSION 08/28/2006   DEGENERATIVE DISC DISEASE 08/28/2006   HYPERLIPIDEMIA 05/29/2006   CARPAL TUNNEL SYNDROME 05/29/2006   HYPERTENSION 05/29/2006   Unspecified constipation 05/29/2006   IBS 05/29/2006   ARTHRITIS 05/29/2006   LOW BACK PAIN 05/29/2006    Orientation RESPIRATION BLADDER Height & Weight     Self, Time, Situation, Place  Normal Continent, External catheter (currently with purewick) Weight: 220 lb (99.8 kg) Height:  5\' 7"  (170.2 cm)  BEHAVIORAL SYMPTOMS/MOOD NEUROLOGICAL BOWEL NUTRITION STATUS      Continent Diet (regular)  AMBULATORY STATUS COMMUNICATION OF NEEDS Skin   Limited Assist Verbally Other (Comment) (surgical incision only)                       Personal Care Assistance Level of Assistance  Bathing, Dressing Bathing Assistance: Limited assistance   Dressing Assistance: Limited assistance     Functional Limitations Info  Sight, Hearing, Speech Sight Info: Adequate Hearing Info: Adequate Speech Info: Adequate    SPECIAL CARE FACTORS FREQUENCY  PT (By licensed PT), OT (By licensed OT)     PT Frequency: 5x/wk OT Frequency: 5x/wk            Contractures Contractures Info: Not present    Additional Factors Info  Code Status, Allergies Code Status Info: Full Allergies Info: Baclofen, Methocarbamol           Current Medications (02/14/2023):  This is the current hospital active medication list Current Facility-Administered Medications  Medication Dose  Route Frequency Provider Last Rate Last Admin   0.9 %  sodium chloride infusion   Intravenous Continuous Kathryne Hitch, MD 75 mL/hr at 02/13/23 1825 New Bag at 02/13/23 1825   acetaminophen (TYLENOL) tablet 325-650 mg  325-650 mg Oral Q6H PRN Kathryne Hitch, MD       albuterol (PROVENTIL) (2.5 MG/3ML) 0.083% nebulizer solution 3 mL  3 mL  Inhalation Q6H PRN Kathryne Hitch, MD       alum & mag hydroxide-simeth (MAALOX/MYLANTA) 200-200-20 MG/5ML suspension 30 mL  30 mL Oral Q4H PRN Kathryne Hitch, MD       aspirin chewable tablet 81 mg  81 mg Oral BID Kathryne Hitch, MD   81 mg at 02/14/23 0814   atorvastatin (LIPITOR) tablet 40 mg  40 mg Oral Daily Kathryne Hitch, MD   40 mg at 02/14/23 0813   cholecalciferol (VITAMIN D3) 25 MCG (1000 UNIT) tablet 1,000 Units  1,000 Units Oral Daily Kathryne Hitch, MD   1,000 Units at 02/14/23 0814   diphenhydrAMINE (BENADRYL) 12.5 MG/5ML elixir 12.5-25 mg  12.5-25 mg Oral Q4H PRN Kathryne Hitch, MD       docusate sodium (COLACE) capsule 100 mg  100 mg Oral BID Kathryne Hitch, MD   100 mg at 02/14/23 5638   gabapentin (NEURONTIN) capsule 800 mg  800 mg Oral TID Kathryne Hitch, MD   800 mg at 02/14/23 0817   hydrochlorothiazide (HYDRODIURIL) tablet 25 mg  25 mg Oral Daily Kathryne Hitch, MD   25 mg at 02/14/23 0813   HYDROmorphone (DILAUDID) injection 0.5-1 mg  0.5-1 mg Intravenous Q4H PRN Kathryne Hitch, MD       lisinopril (ZESTRIL) tablet 40 mg  40 mg Oral Daily Kathryne Hitch, MD   40 mg at 02/14/23 7564   menthol-cetylpyridinium (CEPACOL) lozenge 3 mg  1 lozenge Oral PRN Kathryne Hitch, MD       Or   phenol (CHLORASEPTIC) mouth spray 1 spray  1 spray Mouth/Throat PRN Kathryne Hitch, MD       metFORMIN (GLUCOPHAGE) tablet 500 mg  500 mg Oral Q breakfast Kathryne Hitch, MD   500 mg at 02/14/23 0813   metoCLOPramide (REGLAN) tablet 5-10 mg  5-10 mg Oral Q8H PRN Kathryne Hitch, MD       Or   metoCLOPramide (REGLAN) injection 5-10 mg  5-10 mg Intravenous Q8H PRN Kathryne Hitch, MD       metoprolol tartrate (LOPRESSOR) tablet 25 mg  25 mg Oral BID Kathryne Hitch, MD   25 mg at 02/14/23 0814   ondansetron (ZOFRAN) tablet 4 mg  4 mg Oral Q6H PRN  Kathryne Hitch, MD       Or   ondansetron [facility]) injection 4 mg  4 mg Intravenous Q6H PRN Kathryne Hitch, MD       oxyCODONE (Oxy IR/ROXICODONE) immediate release tablet 10-15 mg  10-15 mg Oral Q4H PRN Kathryne Hitch, MD       oxyCODONE (Oxy IR/ROXICODONE) immediate release tablet 5-10 mg  5-10 mg Oral Q4H PRN Kathryne Hitch, MD   10 mg at 02/14/23 0814   pantoprazole (PROTONIX) EC tablet 40 mg  40 mg Oral Daily Kathryne Hitch, MD   40 mg at 02/14/23 0814   tiZANidine (ZANAFLEX) tablet 2 mg  2 mg Oral Q6H PRN Kathryne Hitch, MD  Discharge Medications: Please see discharge summary for a list of discharge medications.  Relevant Imaging Results:  Relevant Lab Results:   Additional Information SSN [id number]  Amada Jupiter, LCSW

## 2023-02-14 NOTE — Discharge Instructions (Signed)

## 2023-02-14 NOTE — Progress Notes (Signed)
Subjective: 1 Day Post-Op Procedure(s) (LRB): LEFT TOTAL KNEE ARTHROPLASTY (Left) Patient reports pain as moderate.    Objective: Vital signs in last 24 hours: Temp:  [97.6 F (36.4 C)-98.7 F (37.1 C)] 98.2 F (36.8 C) (08/21 0447) Pulse Rate:  [51-82] 51 (08/21 0447) Resp:  [10-20] 17 (08/21 0447) BP: (123-206)/(66-111) 123/66 (08/21 0447) SpO2:  [92 %-100 %] 99 % (08/21 0447) Weight:  [99.8 kg] 99.8 kg (08/20 1015)  Intake/Output from previous day: 08/20 0701 - 08/21 0700 In: 3089 [P.O.:897; I.V.:1942; IV Piggyback:250] Out: 525 [Urine:500; Blood:25] Intake/Output this shift: No intake/output data recorded.  Recent Labs    02/14/23 0333  HGB 10.6*   Recent Labs    02/14/23 0333  WBC 5.9  RBC 3.54*  HCT 33.2*  PLT 172   Recent Labs    02/14/23 0333  NA 138  K 3.9  CL 105  CO2 23  BUN 23  CREATININE 1.03*  GLUCOSE 126*  CALCIUM 9.2   No results for input(s): "LABPT", "INR" in the last 72 hours.  Sensation intact distally Intact pulses distally Dorsiflexion/Plantar flexion intact Incision: dressing C/D/I Compartment soft   Assessment/Plan: 1 Day Post-Op Procedure(s) (LRB): LEFT TOTAL KNEE ARTHROPLASTY (Left) Up with therapy  Just like her last total knee arthroplasty, she will need short-term skilled nursing placement following this hospitalization.  A consultation has been put into the transitional care team.  Later today we can switch her to an inpatient admission.    Kathryne Hitch 02/14/2023, 7:45 AM

## 2023-02-14 NOTE — Care Management Obs Status (Signed)
MEDICARE OBSERVATION STATUS NOTIFICATION   Patient Details  Name: [name] [name] MRN: [id number] Date of Birth: Jul 11, 1950   Medicare Observation Status Notification Given:  Yes    Amada Jupiter, LCSW 02/14/2023, 3:01 PM

## 2023-02-14 NOTE — Progress Notes (Signed)
Physical Therapy Treatment Patient Details Name: [name] [name] MRN: [id number] DOB: 1950/12/08 Today's Date: 02/14/2023   History of Present Illness Pt is 72 yo female s/p L TKA on 02/13/23.  Pt with hx including but not limited to R TKA 6/23, ACDF 5/24, arthritis, DDD, CAD, MI, LBP, neuropathy, DM, vertigo, sleep apnea    PT Comments  Pt with decline in mobility this afternoon - potentially due to lethargy/pain meds.  She was lethargic and required difficult max x 2 squat pivot to get back to the bed.  Pt lethargic and appearing weak.  All VSS on RA.  She had pain meds ~2 hr prior to this session (am was ~4 hr prior).  Question if lethargy/weakness related to pain meds but pt also still had c/o significant pain.  While lethargic - do not recommend OOB transfers (may need to use purewick or bed pan until more alert) . Will cont PT POC.    If plan is discharge home, recommend the following: Two people to help with walking and/or transfers;Two people to help with bathing/dressing/bathroom   Can travel by private vehicle     No  Equipment Recommendations  None recommended by PT    Recommendations for Other Services       Precautions / Restrictions Precautions Precautions: Fall;Knee Required Braces or Orthoses: Knee Immobilizer - Left Knee Immobilizer - Left: Discontinue once straight leg raise with < 10 degree lag Restrictions LLE Weight Bearing: Weight bearing as tolerated     Mobility  Bed Mobility Overal bed mobility: Needs Assistance Bed Mobility: Sit to Supine     Supine to sit: Mod assist Sit to supine: Max assist, +2 for physical assistance   General bed mobility comments: increased time and cues    Transfers Overall transfer level: Needs assistance Equipment used: Rolling walker (2 wheels) Transfers: Sit to/from Stand, Bed to chair/wheelchair/BSC Sit to Stand: Max assist, +2 physical assistance     Squat pivot transfers: Max assist, +2 physical assistance      General transfer comment: Performed from chair with max x 2: pt having difficulty leaning forward and very slow rise to standing with max x 2 and cues to push with R LE.  Pt stood and immediately returned to sitting. Set room up for transfer to bsc then transfer back to bed but pt lethargic and still having difficulty even leaning forward.  Questioned potential orthostatic hypotension but BP 126/63, HR 60 bpm, O2 sats 95%. Pt did not have strength to perform twice.  Removed BSC for stand pivot back to bed.  REquired max x 2 again to partially stand, slow to rise, difficulty leaning forward and max x 2 squad pivot to bed.  Pt stating "wait, wait" but knees buckling and required max x 2 for the pivot       Stairs             Wheelchair Mobility     Tilt Bed    Modified Rankin (Stroke Patients Only)       Balance Overall balance assessment: Needs assistance Sitting-balance support: Bilateral upper extremity supported Sitting balance-Leahy Scale: Poor     Standing balance support: Bilateral upper extremity supported, Reliant on assistive device for balance Standing balance-Leahy Scale: Zero Standing balance comment: max x 2 partial stand                            Cognition Arousal: Lethargic, Suspect due to medications Behavior  During Therapy: WFL for tasks assessed/performed Overall Cognitive Status: Within Functional Limits for tasks assessed                                          Exercises Total Joint Exercises Ankle Circles/Pumps: AROM, Both, 10 reps, Supine Quad Sets: AROM, Both, 10 reps, Supine Knee Flexion: AAROM, Left, 5 reps, Seated  Other Exercises Other Exercises: provided assist and limited motion due to pain    General Comments        Pertinent Vitals/Pain Pain Assessment Pain Assessment: 0-10 Pain Score: 8  Pain Location: L knee Pain Descriptors / Indicators: Discomfort, Sore Pain Intervention(s): Limited activity  within patient's tolerance, Monitored during session, Premedicated before session, Repositioned, Ice applied    Home Living                          Prior Function            PT Goals (current goals can now be found in the care plan section) Progress towards PT goals: Not progressing toward goals - comment (limited by lethargy)    Frequency    7X/week      PT Plan      Co-evaluation              AM-PAC PT "6 Clicks" Mobility   Outcome Measure  Help needed turning from your back to your side while in a flat bed without using bedrails?: Total Help needed moving from lying on your back to sitting on the side of a flat bed without using bedrails?: Total Help needed moving to and from a bed to a chair (including a wheelchair)?: Total Help needed standing up from a chair using your arms (e.g., wheelchair or bedside chair)?: Total Help needed to walk in hospital room?: Total Help needed climbing 3-5 steps with a railing? : Total 6 Click Score: 6    End of Session Equipment Utilized During Treatment: Gait belt Activity Tolerance: Patient limited by pain;Patient limited by lethargy Patient left: with call bell/phone within reach;in bed;with bed alarm set;with SCD's reapplied (ice cuff in place)  Nurse Communication: Mobility status;Other (comment) (Pt with decreased mobility requiring max x 2 for squat pivot and unable pivot to [facility] - will need purewick or bed pan.  Potentially due to medications - had oxycodone 10 around 12:30 (2 hr prior to session), this morning had been closer to 4 hr prior.)  PT Visit Diagnosis: Other abnormalities of gait and mobility (R26.89);Muscle weakness (generalized) (M62.81)     Time: 6578-4696 PT Time Calculation (min) (ACUTE ONLY): 30 min  Charges:    $Gait Training: 8-22 mins $Therapeutic Exercise: 8-22 mins $Therapeutic Activity: 8-22 mins PT General Charges $$ ACUTE PT VISIT: 1 Visit                     [name] [name],  PT Acute Rehab [facility] [phone]    Rayetta Humphrey 02/14/2023, 3:08 PM

## 2023-02-14 NOTE — TOC Initial Note (Signed)
Transition of Care [facility]) - Initial/Assessment Note    Patient Details  Name: [name] [name] MRN: [id number] Date of Birth: 1951/06/05  [facility] CM/SW Contact:    Amada Jupiter, LCSW Phone Number: 02/14/2023, 10:49 AM  Clinical Narrative:                  Met with pt this morning to review dc planning needs.  Pt reports that she does not have needed support in the home (son is working days) and has discussed concerns with MD and PT.  PT has recommended short term SNF as she has done in the past.  Pt and MD in agreement with this plan.  Will begin SNF bed search in [facility] area.  Expected Discharge Plan: Skilled Nursing Facility Barriers to Discharge: SNF Pending bed offer, Insurance Authorization   Patient Goals and CMS Choice Patient states their goals for this hospitalization and ongoing recovery are:: return home following SNF rehab          Expected Discharge Plan and Services In-house Referral: Clinical Social Work   Post Acute Care Choice: Skilled Nursing Facility Living arrangements for the past 2 months: Single Family Home                 DME Arranged: N/A DME Agency: NA                  Prior Living Arrangements/Services Living arrangements for the past 2 months: Single Family Home Lives with:: Adult Children Patient language and need for interpreter reviewed:: Yes Do you feel safe going back to the place where you live?: Yes      Need for Family Participation in Patient Care: Yes (Comment) Care giver support system in place?: No (comment)      Activities of Daily Living Home Assistive Devices/Equipment: Cane (specify quad or straight), Walker (specify type), Eyeglasses, CPAP, Bedside commode/3-in-1, Dentures (specify type) ADL Screening (condition at time of admission) Patient's cognitive ability adequate to safely complete daily activities?: No Is the patient deaf or have difficulty hearing?: No Does the patient have  difficulty seeing, even when wearing glasses/contacts?: No Does the patient have difficulty concentrating, remembering, or making decisions?: No Patient able to express need for assistance with ADLs?: Yes Does the patient have difficulty dressing or bathing?: Yes Independently performs ADLs?: No Communication: Independent Dressing (OT): Needs assistance Is this a change from baseline?: Change from baseline, expected to last >3 days Grooming: Independent Feeding: Independent Bathing: Needs assistance Is this a change from baseline?: Change from baseline, expected to last >3 days Toileting: Needs assistance Is this a change from baseline?: Change from baseline, expected to last >3days In/Out Bed: Needs assistance Is this a change from baseline?: Change from baseline, expected to last >3 days Walks in Home: Needs assistance Is this a change from baseline?: Change from baseline, expected to last >3 days Does the patient have difficulty walking or climbing stairs?: Yes Weakness of Legs: Both Weakness of Arms/Hands: None  Permission Sought/Granted Permission sought to share information with : Family Supports, Magazine features editor Permission granted to share information with : Yes, Verbal Permission Granted  Share Information with NAME: son, Samul Dada @ [phone]  Permission granted to share info w AGENCY: SNFs        Emotional Assessment Appearance:: Appears stated age Attitude/Demeanor/Rapport: Gracious, Engaged Affect (typically observed): Accepting Orientation: : Oriented to Self, Oriented to Place, Oriented to  Time, Oriented to Situation Alcohol / Substance Use:  Not Applicable Psych Involvement: No (comment)  Admission diagnosis:  Status post total left knee replacement [Z96.652] Patient Active Problem List   Diagnosis Date Noted   Status post total left knee replacement 02/13/2023   Cervical spinal stenosis 11/23/2022   Unilateral primary  osteoarthritis, left knee 08/24/2022   Status post total right knee replacement 12/16/2021   Chronic RLQ pain 04/29/2021   Periumbilical pain 04/29/2021   Spinal stenosis of lumbar region with neurogenic claudication    Spondylolisthesis, lumbar region    Other spondylosis with radiculopathy, lumbar region    Acute postoperative anemia due to expected blood loss 02/01/2021    Class: Acute   Fusion of spine of lumbar region 01/31/2021   S/P lumbar fusion L4 to Sacrum 06/22/09 10/11/2020   Myalgia and myositis 06/12/2018   Other and unspecified nonspecific immunological findings 04/25/2018   Osteoarthrosis, hand 10/19/2017   Insomnia 09/23/2015   Plantar fascial fibromatosis 09/23/2015   Shortness of breath 04/28/2015   Abnormal myocardial perfusion study    Disturbance of skin sensation 08/05/2014   Chronic kidney disease, stage III (moderate) (HCC) 02/02/2014   Impaired fasting glucose 02/02/2014   Vitamin D deficiency 10/30/2013   Other osteoporosis 07/29/2012   Myocardial infarction (HCC) 12/29/2010   Cardiovascular disease 05/26/2009   KNEE PAIN, LEFT 07/02/2008   HIP PAIN 04/27/2008   OBESITY 02/24/2008   HYPERGLYCEMIA, FASTING 02/10/2008   GERD 03/12/2007   ANXIETY DEPRESSION 08/28/2006   DEGENERATIVE DISC DISEASE 08/28/2006   HYPERLIPIDEMIA 05/29/2006   CARPAL TUNNEL SYNDROME 05/29/2006   HYPERTENSION 05/29/2006   Unspecified constipation 05/29/2006   IBS 05/29/2006   ARTHRITIS 05/29/2006   LOW BACK PAIN 05/29/2006   PCP:  Juliette Alcide, MD Pharmacy:   [id number] - [place], [place] - [address] [place] [address] Phone: [phone] Fax: [phone]  [id number] - [place], [place] - [address] [place] [address] Phone: [phone] Fax: [phone]  [facility] - [place], [place] - [address] [place] [address] Phone: [phone] Fax:  [phone]     Social Determinants of Health (SDOH) Social History: SDOH Screenings   Food Insecurity: No Food Insecurity (02/13/2023)  Housing: Low Risk  (02/13/2023)  Transportation Needs: No Transportation Needs (02/13/2023)  Utilities: Not At Risk (02/13/2023)  Tobacco Use: Low Risk  (02/13/2023)   SDOH Interventions:     Readmission Risk Interventions     No data to display

## 2023-02-14 NOTE — Progress Notes (Signed)
Physical Therapy Treatment Patient Details Name: [name] [name] MRN: [id number] DOB: 01-18-1951 Today's Date: 02/14/2023   History of Present Illness Pt is 72 yo female s/p L TKA on 02/13/23.  Pt with hx including but not limited to R TKA 6/23, ACDF 5/24, arthritis, DDD, CAD, MI, LBP, neuropathy, DM, vertigo, sleep apnea    PT Comments  Pt with slow progress limited by pain.  Required up to mod A for transfers.  She ambulated 10'x2 with chair follow.  Plan is for pt to d/c SNF at d/c due to limited support at home (son working)- Dr. Magnus Ivan is aware.    If plan is discharge home, recommend the following: A little help with walking and/or transfers;A little help with bathing/dressing/bathroom;Assistance with cooking/housework;Help with stairs or ramp for entrance   Can travel by private vehicle     No  Equipment Recommendations  None recommended by PT    Recommendations for Other Services       Precautions / Restrictions Precautions Precautions: Fall;Knee Required Braces or Orthoses: Knee Immobilizer - Left Knee Immobilizer - Left: Discontinue once straight leg raise with < 10 degree lag Restrictions LLE Weight Bearing: Weight bearing as tolerated     Mobility  Bed Mobility Overal bed mobility: Needs Assistance Bed Mobility: Supine to Sit     Supine to sit: Mod assist     General bed mobility comments: increased time and cues    Transfers Overall transfer level: Needs assistance Equipment used: Rolling walker (2 wheels) Transfers: Sit to/from Stand Sit to Stand: Mod assist           General transfer comment: Cues for hand placement and L LE management; mod A from bed and recliner, slow transitions    Ambulation/Gait Ambulation/Gait assistance: Min assist Gait Distance (Feet): 10 Feet (10'x2) Assistive device: Rolling walker (2 wheels) Gait Pattern/deviations: Step-to pattern, Decreased stride length, Knee flexed in stance - left, Decreased weight shift to  left Gait velocity: decreased     General Gait Details: Cue for RW proxmity and sequencing; fatigued easily, chair follow   Stairs             Wheelchair Mobility     Tilt Bed    Modified Rankin (Stroke Patients Only)       Balance Overall balance assessment: Needs assistance Sitting-balance support: No upper extremity supported Sitting balance-Leahy Scale: Good     Standing balance support: Bilateral upper extremity supported, Reliant on assistive device for balance Standing balance-Leahy Scale: Poor                              Cognition Arousal: Alert Behavior During Therapy: WFL for tasks assessed/performed Overall Cognitive Status: Within Functional Limits for tasks assessed                                          Exercises Total Joint Exercises Ankle Circles/Pumps: AROM, Both, 10 reps, Supine Quad Sets: AROM, Both, 10 reps, Supine Long Arc Quad: AAROM, Left, 5 reps, Seated Knee Flexion: AAROM, Left, 5 reps, Seated Goniometric ROM: L knee 5 to 60 Other Exercises Other Exercises: provided assist and limited motion due to pain    General Comments        Pertinent Vitals/Pain Pain Assessment Pain Assessment: 0-10 Pain Score: 8  Pain Location: L knee Pain Descriptors /  Indicators: Discomfort, Sore Pain Intervention(s): Limited activity within patient's tolerance, Monitored during session, Premedicated before session, Repositioned, RN gave pain meds during session, Ice applied    Home Living                          Prior Function            PT Goals (current goals can now be found in the care plan section) Progress towards PT goals: Progressing toward goals    Frequency    7X/week      PT Plan      Co-evaluation              AM-PAC PT "6 Clicks" Mobility   Outcome Measure  Help needed turning from your back to your side while in a flat bed without using bedrails?: A Little Help  needed moving from lying on your back to sitting on the side of a flat bed without using bedrails?: A Lot Help needed moving to and from a bed to a chair (including a wheelchair)?: A Lot Help needed standing up from a chair using your arms (e.g., wheelchair or bedside chair)?: A Lot Help needed to walk in hospital room?: Total Help needed climbing 3-5 steps with a railing? : Total 6 Click Score: 11    End of Session Equipment Utilized During Treatment: Gait belt Activity Tolerance: Patient limited by pain Patient left: with chair alarm set;in chair;with call bell/phone within reach Nurse Communication: Mobility status PT Visit Diagnosis: Other abnormalities of gait and mobility (R26.89);Muscle weakness (generalized) (M62.81)     Time: 1207-1239 PT Time Calculation (min) (ACUTE ONLY): 32 min  Charges:    $Gait Training: 8-22 mins $Therapeutic Exercise: 8-22 mins PT General Charges $$ ACUTE PT VISIT: 1 Visit                     Anise Salvo, PT Acute Rehab [facility] [phone]    Rayetta Humphrey 02/14/2023, 1:04 PM

## 2023-02-15 DIAGNOSIS — Z96651 Presence of right artificial knee joint: Secondary | ICD-10-CM | POA: Diagnosis not present

## 2023-02-15 DIAGNOSIS — I2510 Atherosclerotic heart disease of native coronary artery without angina pectoris: Secondary | ICD-10-CM | POA: Diagnosis not present

## 2023-02-15 DIAGNOSIS — Z7982 Long term (current) use of aspirin: Secondary | ICD-10-CM | POA: Diagnosis not present

## 2023-02-15 DIAGNOSIS — M1712 Unilateral primary osteoarthritis, left knee: Secondary | ICD-10-CM | POA: Diagnosis not present

## 2023-02-15 DIAGNOSIS — Z79899 Other long term (current) drug therapy: Secondary | ICD-10-CM | POA: Diagnosis not present

## 2023-02-15 DIAGNOSIS — N183 Chronic kidney disease, stage 3 unspecified: Secondary | ICD-10-CM | POA: Diagnosis not present

## 2023-02-15 DIAGNOSIS — Z7984 Long term (current) use of oral hypoglycemic drugs: Secondary | ICD-10-CM | POA: Diagnosis not present

## 2023-02-15 DIAGNOSIS — E1122 Type 2 diabetes mellitus with diabetic chronic kidney disease: Secondary | ICD-10-CM | POA: Diagnosis not present

## 2023-02-15 DIAGNOSIS — I129 Hypertensive chronic kidney disease with stage 1 through stage 4 chronic kidney disease, or unspecified chronic kidney disease: Secondary | ICD-10-CM | POA: Diagnosis not present

## 2023-02-15 MED ORDER — OXYCODONE HCL 5 MG PO TABS: 5.0000 mg | ORAL_TABLET | Freq: Four times a day (QID) | ORAL | 0 refills | Status: DC | PRN

## 2023-02-15 MED ORDER — ASPIRIN 81 MG PO CHEW: 81.0000 mg | CHEWABLE_TABLET | Freq: Two times a day (BID) | ORAL | 0 refills | Status: DC

## 2023-02-15 MED ORDER — GABAPENTIN 400 MG PO CAPS
800.0000 mg | ORAL_CAPSULE | Freq: Two times a day (BID) | ORAL | Status: DC
  Administered 2023-02-15 – 2023-02-16 (×3): 800 mg via ORAL
  Filled 2023-02-15 (×3): qty 2

## 2023-02-15 NOTE — Plan of Care (Signed)
  Problem: Activity: Goal: Range of joint motion will improve Outcome: Progressing   Problem: Pain Management: Goal: Pain level will decrease with appropriate interventions Outcome: Progressing   Problem: Safety: Goal: Ability to remain free from injury will improve Outcome: Progressing   

## 2023-02-15 NOTE — Plan of Care (Signed)
  Problem: Education: Goal: Knowledge of the prescribed therapeutic regimen will improve Outcome: Progressing   Problem: Pain Management: Goal: Pain level will decrease with appropriate interventions Outcome: Progressing   Problem: Activity: Goal: Ability to avoid complications of mobility impairment will improve Outcome: Progressing   

## 2023-02-15 NOTE — TOC Progression Note (Signed)
Transition of Care [facility]) - Progression Note    Patient Details  Name: ANNALYCE BUCIO MRN: [id number] Date of Birth: 1950-12-22  Transition of Care [facility] CM/SW Contact  Amada Jupiter, LCSW Phone Number: 02/15/2023, 10:43 AM  Clinical Narrative:     Have reviewed SNF bed offers with pt and she has accepted bed at [facility] who can admit pt tomorrow.  Will begin insurance authorization.  MD and RN aware.  Expected Discharge Plan: Skilled Nursing Facility Barriers to Discharge: SNF Pending bed offer, Insurance Authorization  Expected Discharge Plan and Services In-house Referral: Clinical Social Work   Post Acute Care Choice: Skilled Nursing Facility Living arrangements for the past 2 months: Single Family Home                 DME Arranged: N/A DME Agency: NA                   Social Determinants of Health (SDOH) Interventions SDOH Screenings   Food Insecurity: No Food Insecurity (02/13/2023)  Housing: Low Risk  (02/13/2023)  Transportation Needs: No Transportation Needs (02/13/2023)  Utilities: Not At Risk (02/13/2023)  Tobacco Use: Low Risk  (02/13/2023)    Readmission Risk Interventions     No data to display

## 2023-02-15 NOTE — Progress Notes (Signed)
Patient ID: [name] [name], female   DOB: 1950/08/19, 72 y.o.   MRN: [id number] There has been no acute change in the patient's medical status.  She does get drowsy with Neurontin.  She is on 800 mg 3 times a day and has been on this long-term.  From the standpoint of her surgery, her left operative knee is stable.  The incision is clean and dry.  Calf is soft.  She is alert this morning.  We will change her Neurontin to just twice daily while she is on narcotics.  She is awaiting short-term skilled nursing placement.  When a bed is available she can be discharged.

## 2023-02-15 NOTE — Anesthesia Postprocedure Evaluation (Addendum)
Anesthesia Post Note  Patient: [name] [name]  Procedure(s) Performed: LEFT TOTAL KNEE ARTHROPLASTY (Left: Knee)     Patient location during evaluation: PACU Anesthesia Type: General Level of consciousness: sedated and patient cooperative Pain management: pain level controlled Vital Signs Assessment: post-procedure vital signs reviewed and stable Respiratory status: spontaneous breathing Cardiovascular status: stable Anesthetic complications: no   No notable events documented.  Last Vitals:  Vitals:   02/14/23 2231 02/15/23 0540  BP: (!) 147/81 (!) 144/68  Pulse: 86 77  Resp: 16 17  Temp: 37.3 C 37.5 C  SpO2: 95% 96%    Last Pain:  Vitals:   02/15/23 0647  TempSrc:   PainSc: 7                  Kalla Watson    Pt vomiting prior spinal

## 2023-02-15 NOTE — Progress Notes (Signed)
Physical Therapy Treatment Patient Details Name: [name] [name] MRN: [id number] DOB: January 03, 1951 Today's Date: 02/15/2023   History of Present Illness Pt is 72 yo female s/p L TKA on 02/13/23.  Pt with hx including but not limited to R TKA 6/23, ACDF 5/24, arthritis, DDD, CAD, MI, LBP, neuropathy, DM, vertigo, sleep apnea    PT Comments  Pt continues rousable but requiring increased stimulus to remain awake and to follow cues for therex program.. OOB deferred 2* safety issues.    If plan is discharge home, recommend the following: Two people to help with walking and/or transfers;Two people to help with bathing/dressing/bathroom   Can travel by private vehicle     No  Equipment Recommendations  None recommended by PT    Recommendations for Other Services       Precautions / Restrictions Precautions Precautions: Fall;Knee Required Braces or Orthoses: Knee Immobilizer - Left Knee Immobilizer - Left: Discontinue once straight leg raise with < 10 degree lag Restrictions Weight Bearing Restrictions: No LLE Weight Bearing: Weight bearing as tolerated     Mobility  Bed Mobility               General bed mobility comments: Deferred 2* lethargy    Transfers                        Ambulation/Gait                   Stairs             Wheelchair Mobility     Tilt Bed    Modified Rankin (Stroke Patients Only)       Balance                                            Cognition Arousal: Lethargic, Suspect due to medications Behavior During Therapy: Flat affect, WFL for tasks assessed/performed Overall Cognitive Status: No family/caregiver present to determine baseline cognitive functioning                                          Exercises Total Joint Exercises Ankle Circles/Pumps: Both, 10 reps, Supine, AAROM, AROM Quad Sets: AROM, Both, 10 reps, Supine Heel Slides: AAROM, Left, 15 reps, Supine Hip  ABduction/ADduction: AAROM, Left, 10 reps, Other reps (comment) Straight Leg Raises: AAROM, Left, 15 reps, Supine Other Exercises Other Exercises: provided assist and limited motion due to pain    General Comments        Pertinent Vitals/Pain Pain Assessment Pain Assessment: 0-10 Pain Score: 8  Pain Location: L knee Pain Descriptors / Indicators: Discomfort, Sore, Grimacing, Guarding Pain Intervention(s): Limited activity within patient's tolerance, Monitored during session, Ice applied ([facility] [employer] by ongoing lethargy)    Home Living                          Prior Function            PT Goals (current goals can now be found in the care plan section) Acute Rehab PT Goals Patient Stated Goal: return home after rehab PT Goal Formulation: With patient/family Time For Goal Achievement: 02/27/23 Potential to Achieve Goals: Good Progress towards PT goals: Not progressing  toward goals - comment (limited by lethargy)    Frequency    7X/week      PT Plan      Co-evaluation              AM-PAC PT "6 Clicks" Mobility   Outcome Measure  Help needed turning from your back to your side while in a flat bed without using bedrails?: Total Help needed moving from lying on your back to sitting on the side of a flat bed without using bedrails?: Total Help needed moving to and from a bed to a chair (including a wheelchair)?: Total Help needed standing up from a chair using your arms (e.g., wheelchair or bedside chair)?: Total Help needed to walk in hospital room?: Total Help needed climbing 3-5 steps with a railing? : Total 6 Click Score: 6    End of Session Equipment Utilized During Treatment: Gait belt Activity Tolerance: Patient limited by pain;Patient limited by lethargy Patient left: with call bell/phone within reach;in bed;with bed alarm set;with SCD's reapplied Nurse Communication: Mobility status;Other (comment) PT Visit Diagnosis: Other abnormalities  of gait and mobility (R26.89);Muscle weakness (generalized) (M62.81)     Time: 1610-9604 PT Time Calculation (min) (ACUTE ONLY): 15 min  Charges:    $Therapeutic Exercise: 8-22 mins PT General Charges $$ ACUTE PT VISIT: 1 Visit                     Mauro Kaufmann PT Acute Rehabilitation Services Pager [phone] Office [phone]    [facility] 02/15/2023, 12:38 PM

## 2023-02-16 DIAGNOSIS — Z4789 Encounter for other orthopedic aftercare: Secondary | ICD-10-CM | POA: Diagnosis not present

## 2023-02-16 DIAGNOSIS — D631 Anemia in chronic kidney disease: Secondary | ICD-10-CM | POA: Diagnosis not present

## 2023-02-16 DIAGNOSIS — I9789 Other postprocedural complications and disorders of the circulatory system, not elsewhere classified: Secondary | ICD-10-CM | POA: Diagnosis not present

## 2023-02-16 DIAGNOSIS — Z7982 Long term (current) use of aspirin: Secondary | ICD-10-CM | POA: Diagnosis not present

## 2023-02-16 DIAGNOSIS — F4321 Adjustment disorder with depressed mood: Secondary | ICD-10-CM | POA: Diagnosis not present

## 2023-02-16 DIAGNOSIS — M1712 Unilateral primary osteoarthritis, left knee: Secondary | ICD-10-CM | POA: Diagnosis not present

## 2023-02-16 DIAGNOSIS — Z471 Aftercare following joint replacement surgery: Secondary | ICD-10-CM | POA: Diagnosis not present

## 2023-02-16 DIAGNOSIS — E039 Hypothyroidism, unspecified: Secondary | ICD-10-CM | POA: Diagnosis not present

## 2023-02-16 DIAGNOSIS — E119 Type 2 diabetes mellitus without complications: Secondary | ICD-10-CM | POA: Diagnosis not present

## 2023-02-16 DIAGNOSIS — Z7984 Long term (current) use of oral hypoglycemic drugs: Secondary | ICD-10-CM | POA: Diagnosis not present

## 2023-02-16 DIAGNOSIS — R5381 Other malaise: Secondary | ICD-10-CM | POA: Diagnosis not present

## 2023-02-16 DIAGNOSIS — K219 Gastro-esophageal reflux disease without esophagitis: Secondary | ICD-10-CM | POA: Diagnosis not present

## 2023-02-16 DIAGNOSIS — L89153 Pressure ulcer of sacral region, stage 3: Secondary | ICD-10-CM | POA: Diagnosis not present

## 2023-02-16 DIAGNOSIS — I10 Essential (primary) hypertension: Secondary | ICD-10-CM | POA: Diagnosis not present

## 2023-02-16 DIAGNOSIS — I82402 Acute embolism and thrombosis of unspecified deep veins of left lower extremity: Secondary | ICD-10-CM | POA: Diagnosis not present

## 2023-02-16 DIAGNOSIS — D62 Acute posthemorrhagic anemia: Secondary | ICD-10-CM | POA: Diagnosis not present

## 2023-02-16 DIAGNOSIS — R531 Weakness: Secondary | ICD-10-CM | POA: Diagnosis not present

## 2023-02-16 DIAGNOSIS — E669 Obesity, unspecified: Secondary | ICD-10-CM | POA: Diagnosis not present

## 2023-02-16 DIAGNOSIS — E785 Hyperlipidemia, unspecified: Secondary | ICD-10-CM | POA: Diagnosis not present

## 2023-02-16 DIAGNOSIS — M6281 Muscle weakness (generalized): Secondary | ICD-10-CM | POA: Diagnosis not present

## 2023-02-16 DIAGNOSIS — Z96651 Presence of right artificial knee joint: Secondary | ICD-10-CM | POA: Diagnosis not present

## 2023-02-16 DIAGNOSIS — N189 Chronic kidney disease, unspecified: Secondary | ICD-10-CM | POA: Diagnosis not present

## 2023-02-16 DIAGNOSIS — R600 Localized edema: Secondary | ICD-10-CM | POA: Diagnosis not present

## 2023-02-16 DIAGNOSIS — Z7401 Bed confinement status: Secondary | ICD-10-CM | POA: Diagnosis not present

## 2023-02-16 DIAGNOSIS — E561 Deficiency of vitamin K: Secondary | ICD-10-CM | POA: Diagnosis not present

## 2023-02-16 DIAGNOSIS — Z96642 Presence of left artificial hip joint: Secondary | ICD-10-CM | POA: Diagnosis not present

## 2023-02-16 DIAGNOSIS — Z96652 Presence of left artificial knee joint: Secondary | ICD-10-CM | POA: Diagnosis not present

## 2023-02-16 DIAGNOSIS — E1122 Type 2 diabetes mellitus with diabetic chronic kidney disease: Secondary | ICD-10-CM | POA: Diagnosis not present

## 2023-02-16 DIAGNOSIS — Z79899 Other long term (current) drug therapy: Secondary | ICD-10-CM | POA: Diagnosis not present

## 2023-02-16 DIAGNOSIS — Z20822 Contact with and (suspected) exposure to covid-19: Secondary | ICD-10-CM | POA: Diagnosis not present

## 2023-02-16 DIAGNOSIS — I82412 Acute embolism and thrombosis of left femoral vein: Secondary | ICD-10-CM | POA: Diagnosis not present

## 2023-02-16 DIAGNOSIS — L89626 Pressure-induced deep tissue damage of left heel: Secondary | ICD-10-CM | POA: Diagnosis not present

## 2023-02-16 DIAGNOSIS — N183 Chronic kidney disease, stage 3 unspecified: Secondary | ICD-10-CM | POA: Diagnosis not present

## 2023-02-16 DIAGNOSIS — E1140 Type 2 diabetes mellitus with diabetic neuropathy, unspecified: Secondary | ICD-10-CM | POA: Diagnosis not present

## 2023-02-16 DIAGNOSIS — E559 Vitamin D deficiency, unspecified: Secondary | ICD-10-CM | POA: Diagnosis not present

## 2023-02-16 DIAGNOSIS — M79605 Pain in left leg: Secondary | ICD-10-CM | POA: Diagnosis not present

## 2023-02-16 DIAGNOSIS — M1612 Unilateral primary osteoarthritis, left hip: Secondary | ICD-10-CM | POA: Diagnosis not present

## 2023-02-16 DIAGNOSIS — G4700 Insomnia, unspecified: Secondary | ICD-10-CM | POA: Diagnosis not present

## 2023-02-16 DIAGNOSIS — R509 Fever, unspecified: Secondary | ICD-10-CM | POA: Diagnosis not present

## 2023-02-16 DIAGNOSIS — M25562 Pain in left knee: Secondary | ICD-10-CM | POA: Diagnosis not present

## 2023-02-16 DIAGNOSIS — M25552 Pain in left hip: Secondary | ICD-10-CM | POA: Diagnosis not present

## 2023-02-16 DIAGNOSIS — I2510 Atherosclerotic heart disease of native coronary artery without angina pectoris: Secondary | ICD-10-CM | POA: Diagnosis not present

## 2023-02-16 DIAGNOSIS — R609 Edema, unspecified: Secondary | ICD-10-CM | POA: Diagnosis not present

## 2023-02-16 DIAGNOSIS — I82432 Acute embolism and thrombosis of left popliteal vein: Secondary | ICD-10-CM | POA: Diagnosis not present

## 2023-02-16 DIAGNOSIS — I129 Hypertensive chronic kidney disease with stage 1 through stage 4 chronic kidney disease, or unspecified chronic kidney disease: Secondary | ICD-10-CM | POA: Diagnosis not present

## 2023-02-16 DIAGNOSIS — L89620 Pressure ulcer of left heel, unstageable: Secondary | ICD-10-CM | POA: Diagnosis not present

## 2023-02-16 DIAGNOSIS — T709XXA Effect of air pressure and water pressure, unspecified, initial encounter: Secondary | ICD-10-CM | POA: Diagnosis not present

## 2023-02-16 DIAGNOSIS — M48062 Spinal stenosis, lumbar region with neurogenic claudication: Secondary | ICD-10-CM | POA: Diagnosis not present

## 2023-02-16 DIAGNOSIS — M25462 Effusion, left knee: Secondary | ICD-10-CM | POA: Diagnosis not present

## 2023-02-16 DIAGNOSIS — M4326 Fusion of spine, lumbar region: Secondary | ICD-10-CM | POA: Diagnosis not present

## 2023-02-16 NOTE — Plan of Care (Signed)
°  Problem: Education: °Goal: Knowledge of the prescribed therapeutic regimen will improve °Outcome: Progressing °  °Problem: Activity: °Goal: Range of joint motion will improve °Outcome: Progressing °  °Problem: Clinical Measurements: °Goal: Postoperative complications will be avoided or minimized °Outcome: Progressing °  °Problem: Pain Management: °Goal: Pain level will decrease with appropriate interventions °Outcome: Progressing °  °Problem: Safety: °Goal: Ability to remain free from injury will improve °Outcome: Progressing °  °

## 2023-02-16 NOTE — TOC Transition Note (Addendum)
Transition of Care [facility]) - CM/SW Discharge Note   Patient Details  Name: [name] [name] MRN: [id number] Date of Birth: [birth year]  Transition of Care [facility]) CM/SW Contact:  Amada Jupiter, LCSW Phone Number: 02/16/2023, 11:36 AM   Clinical Narrative:     Pt medically cleared for dc today to [facility].  Pt aware and agreeable PTAR called at 11:35am.  RN to call report to [phone].   Left VM at sister's # but unable to reach son and his phone VM is full.  No further TOC needs.  ADDENDUM: Have spoken with son and sister who are aware and agreeable with pt dc today to [facility].  No questions.  Final next level of care: Skilled Nursing Facility Barriers to Discharge: Barriers Resolved   Patient Goals and CMS Choice      Discharge Placement PASRR number recieved: 02/14/23 PASRR number recieved: 02/14/23            Patient chooses bed at: [facility] Patient to be transferred to facility by: PTAR Name of family member notified: unable to leave message for son (VM full) but did leave VM for sister listed on chart Patient and family notified of of transfer: 02/16/23  Discharge Plan and Services Additional resources added to the After Visit Summary for   In-house Referral: Clinical Social Work   Post Acute Care Choice: Skilled Nursing Facility          DME Arranged: N/A DME Agency: NA                  Social Determinants of Health (SDOH) Interventions SDOH Screenings   Food Insecurity: No Food Insecurity (02/13/2023)  Housing: Low Risk  (02/13/2023)  Transportation Needs: No Transportation Needs (02/13/2023)  Utilities: Not At Risk (02/13/2023)  Tobacco Use: Low Risk  (02/13/2023)     Readmission Risk Interventions     No data to display

## 2023-02-16 NOTE — Discharge Summary (Signed)
Patient ID: [name] [name] MRN: [id number] DOB/AGE: 12-27-1950 72 y.o.  Admit date: 02/13/2023 Discharge date: 02/16/2023  Admission Diagnoses:  Principal Problem:   Unilateral primary osteoarthritis, left knee Active Problems:   Status post total left knee replacement   Discharge Diagnoses:  Same  Past Medical History:  Diagnosis Date   Arthritis    Carpal tunnel syndrome    Chronic kidney disease (CKD), stage II (mild)    Constipation    Coronary artery disease    NSTEMI 2004, DES to ostial RCA   DDD (degenerative disc disease)    Essential hypertension    GERD (gastroesophageal reflux disease)    Hyperlipidemia    IBS (irritable bowel syndrome)    Low back pain    Myocardial infarction (HCC) 2004   one stent placed   Neuropathy    bilateral hands r/t cervical spine issues   Pneumonia    Postmenopausal    Pulmonary nodule    Sleep apnea    "waiting on machine to come".   Type 2 diabetes mellitus (HCC)    Vertigo     Surgeries: Procedure(s): LEFT TOTAL KNEE ARTHROPLASTY on 02/13/2023   Consultants:   Discharged Condition: Improved  Hospital Course: [name] [name] is an 72 y.o. female who was admitted 02/13/2023 for operative treatment ofUnilateral primary osteoarthritis, left knee. Patient has severe unremitting pain that affects sleep, daily activities, and work/hobbies. After pre-op clearance the patient was taken to the operating room on 02/13/2023 and underwent  Procedure(s): LEFT TOTAL KNEE ARTHROPLASTY.    Patient was given perioperative antibiotics:  Anti-infectives (From admission, onward)    Start     Dose/Rate Route Frequency Ordered Stop   02/13/23 1830  ceFAZolin (ANCEF) IVPB 1 g/50 mL premix        1 g 100 mL/hr over 30 Minutes Intravenous Every 6 hours 02/13/23 1654 02/14/23 0021   02/13/23 1015  ceFAZolin (ANCEF) IVPB 2g/100 mL premix        2 g 200 mL/hr over 30 Minutes Intravenous On call to O.R. 02/13/23 1009 02/13/23 1228         Patient was given sequential compression devices, early ambulation, and chemoprophylaxis to prevent DVT.  Patient benefited maximally from hospital stay and there were no complications.    Recent vital signs: Patient Vitals for the past 24 hrs:  BP Temp Temp src Pulse Resp SpO2  02/16/23 0621 (!) 120/58 98.6 F (37 C) Oral 61 15 100 %  02/15/23 2051 (!) 159/72 99.7 F (37.6 C) Oral 89 17 100 %  02/15/23 1300 (!) 140/59 99.2 F (37.3 C) Oral 69 18 --     Recent laboratory studies:  Recent Labs    02/14/23 0333  WBC 5.9  HGB 10.6*  HCT 33.2*  PLT 172  NA 138  K 3.9  CL 105  CO2 23  BUN 23  CREATININE 1.03*  GLUCOSE 126*  CALCIUM 9.2     Discharge Medications:   Allergies as of 02/16/2023       Reactions   Baclofen    Upset stomach, excessive fatigue, loss of balance    Methocarbamol    Upset stomach, excessive fatigue, loss of balance         Medication List     TAKE these medications    acetaminophen 500 MG tablet Commonly known as: TYLENOL Take 1,000 mg by mouth every 6 (six) hours as needed for moderate pain.   albuterol 108 (90 Base) MCG/ACT inhaler Commonly  known as: VENTOLIN HFA Inhale 2 puffs into the lungs every 6 (six) hours as needed for wheezing or shortness of breath.   aspirin 81 MG chewable tablet Chew 1 tablet (81 mg total) by mouth 2 (two) times daily. What changed: when to take this   atorvastatin 40 MG tablet Commonly known as: LIPITOR Take 40 mg by mouth daily.   cholecalciferol 25 MCG (1000 UNIT) tablet Commonly known as: VITAMIN D3 Take 1,000 Units by mouth daily.   famotidine 20 MG tablet Commonly known as: PEPCID Take 20 mg by mouth daily as needed for heartburn or indigestion.   gabapentin 800 MG tablet Commonly known as: NEURONTIN Take 800 mg by mouth 3 (three) times daily.   hydrochlorothiazide 25 MG tablet Commonly known as: HYDRODIURIL Take 25 mg by mouth daily.   lisinopril 40 MG tablet Commonly known as:  ZESTRIL Take 40 mg by mouth daily.   LUBRICATING EYE DROPS OP Place 1 drop into both eyes daily as needed (dry eyes).   metFORMIN 500 MG tablet Commonly known as: GLUCOPHAGE Take 500 mg by mouth daily.   metoprolol tartrate 25 MG tablet Commonly known as: LOPRESSOR Take 25 mg by mouth 2 (two) times daily. Take for 14 days   oxyCODONE 5 MG immediate release tablet Commonly known as: Oxy IR/ROXICODONE Take 1-2 tablets (5-10 mg total) by mouth every 6 (six) hours as needed for moderate pain (pain score 4-6).               Durable Medical Equipment  (From admission, onward)           Start     Ordered   02/13/23 1654  DME 3 n 1  Once        02/13/23 1654   02/13/23 1654  DME Walker rolling  Once       Question Answer Comment  Walker: With 5 Inch Wheels   Patient needs a walker to treat with the following condition Status post total left knee replacement      02/13/23 1654            Diagnostic Studies: DG Knee Left Port  Result Date: 02/13/2023 CLINICAL DATA:  Status post total left knee arthroplasty. EXAM: PORTABLE LEFT KNEE - 1-2 VIEW COMPARISON:  Left knee radiographs 07/02/2008 FINDINGS: Interval total left knee arthroplasty.No perihardware lucency is seen to indicate hardware failure or loosening. Small joint effusion. Expected postoperative changes including intra-articular and subcutaneous air. Anterior surgical skin staples. No acute fracture or dislocation. IMPRESSION: Interval total left knee arthroplasty without evidence of hardware failure or loosening. Electronically Signed   By: Neita Garnet M.D.   On: 02/13/2023 16:59    Disposition: Discharge disposition: 03-Skilled Nursing Facility          Contact information for follow-up providers     Kathryne Hitch, MD Follow up in 2 week(s).   Specialty: Orthopedic Surgery Contact information: [address] [place] [address] [phone]              Contact information for  after-discharge care     Destination     [facility]erred [facility] .   Service: Skilled Nursing Contact information: [address] [place] [address] [phone]                      Signed: Kathryne Hitch 02/16/2023, 7:07 AM

## 2023-02-16 NOTE — Progress Notes (Signed)
Subjective: 3 Days Post-Op Procedure(s) (LRB): LEFT TOTAL KNEE ARTHROPLASTY (Left) Patient reports pain as moderate.    Objective: Vital signs in last 24 hours: Temp:  [98.6 F (37 C)-99.7 F (37.6 C)] 98.6 F (37 C) (08/23 0621) Pulse Rate:  [61-89] 61 (08/23 0621) Resp:  [15-18] 15 (08/23 0621) BP: (120-159)/(58-72) 120/58 (08/23 0621) SpO2:  [100 %] 100 % (08/23 0621)  Intake/Output from previous day: 08/22 0701 - 08/23 0700 In: 1278.1 [P.O.:360; I.V.:918.1] Out: 1200 [Urine:1200] Intake/Output this shift: No intake/output data recorded.  Recent Labs    02/14/23 0333  HGB 10.6*   Recent Labs    02/14/23 0333  WBC 5.9  RBC 3.54*  HCT 33.2*  PLT 172   Recent Labs    02/14/23 0333  NA 138  K 3.9  CL 105  CO2 23  BUN 23  CREATININE 1.03*  GLUCOSE 126*  CALCIUM 9.2   No results for input(s): "LABPT", "INR" in the last 72 hours.  Sensation intact distally Intact pulses distally Dorsiflexion/Plantar flexion intact Incision: dressing C/D/I Compartment soft   Assessment/Plan: 3 Days Post-Op Procedure(s) (LRB): LEFT TOTAL KNEE ARTHROPLASTY (Left) Discharge to SNF      Kathryne Hitch 02/16/2023, 7:06 AM

## 2023-02-19 DIAGNOSIS — Z96652 Presence of left artificial knee joint: Secondary | ICD-10-CM | POA: Diagnosis not present

## 2023-02-19 DIAGNOSIS — E785 Hyperlipidemia, unspecified: Secondary | ICD-10-CM | POA: Diagnosis not present

## 2023-02-19 DIAGNOSIS — E1140 Type 2 diabetes mellitus with diabetic neuropathy, unspecified: Secondary | ICD-10-CM | POA: Diagnosis not present

## 2023-02-19 DIAGNOSIS — K219 Gastro-esophageal reflux disease without esophagitis: Secondary | ICD-10-CM | POA: Diagnosis not present

## 2023-02-19 DIAGNOSIS — E559 Vitamin D deficiency, unspecified: Secondary | ICD-10-CM | POA: Diagnosis not present

## 2023-02-19 DIAGNOSIS — I10 Essential (primary) hypertension: Secondary | ICD-10-CM | POA: Diagnosis not present

## 2023-02-19 DIAGNOSIS — M1712 Unilateral primary osteoarthritis, left knee: Secondary | ICD-10-CM | POA: Diagnosis not present

## 2023-02-19 DIAGNOSIS — Z4789 Encounter for other orthopedic aftercare: Secondary | ICD-10-CM | POA: Diagnosis not present

## 2023-02-19 DIAGNOSIS — I2510 Atherosclerotic heart disease of native coronary artery without angina pectoris: Secondary | ICD-10-CM | POA: Diagnosis not present

## 2023-02-20 ENCOUNTER — Emergency Department (HOSPITAL_COMMUNITY)
Admission: EM | Admit: 2023-02-20 | Discharge: 2023-02-20 | Disposition: A | Discharge status: Rehabilitation Facility | Payer: Medicare HMO | Attending: Emergency Medicine | Admitting: Emergency Medicine

## 2023-02-20 ENCOUNTER — Other Ambulatory Visit: Payer: Self-pay

## 2023-02-20 ENCOUNTER — Emergency Department (HOSPITAL_COMMUNITY): Payer: Medicare HMO

## 2023-02-20 ENCOUNTER — Encounter (HOSPITAL_COMMUNITY): Payer: Self-pay

## 2023-02-20 DIAGNOSIS — Z7982 Long term (current) use of aspirin: Secondary | ICD-10-CM | POA: Diagnosis not present

## 2023-02-20 DIAGNOSIS — Z20822 Contact with and (suspected) exposure to covid-19: Secondary | ICD-10-CM | POA: Diagnosis not present

## 2023-02-20 DIAGNOSIS — Z471 Aftercare following joint replacement surgery: Secondary | ICD-10-CM | POA: Diagnosis not present

## 2023-02-20 DIAGNOSIS — R600 Localized edema: Secondary | ICD-10-CM | POA: Insufficient documentation

## 2023-02-20 DIAGNOSIS — Z4789 Encounter for other orthopedic aftercare: Secondary | ICD-10-CM | POA: Diagnosis not present

## 2023-02-20 DIAGNOSIS — T709XXA Effect of air pressure and water pressure, unspecified, initial encounter: Secondary | ICD-10-CM | POA: Diagnosis not present

## 2023-02-20 DIAGNOSIS — Z96652 Presence of left artificial knee joint: Secondary | ICD-10-CM | POA: Insufficient documentation

## 2023-02-20 DIAGNOSIS — R509 Fever, unspecified: Secondary | ICD-10-CM | POA: Insufficient documentation

## 2023-02-20 DIAGNOSIS — R609 Edema, unspecified: Secondary | ICD-10-CM | POA: Diagnosis not present

## 2023-02-20 DIAGNOSIS — N189 Chronic kidney disease, unspecified: Secondary | ICD-10-CM | POA: Diagnosis not present

## 2023-02-20 DIAGNOSIS — M25562 Pain in left knee: Secondary | ICD-10-CM | POA: Diagnosis not present

## 2023-02-20 DIAGNOSIS — M79605 Pain in left leg: Secondary | ICD-10-CM | POA: Diagnosis not present

## 2023-02-20 DIAGNOSIS — I2510 Atherosclerotic heart disease of native coronary artery without angina pectoris: Secondary | ICD-10-CM | POA: Diagnosis not present

## 2023-02-20 DIAGNOSIS — M25462 Effusion, left knee: Secondary | ICD-10-CM | POA: Diagnosis not present

## 2023-02-20 LAB — CBC WITH DIFFERENTIAL/PLATELET
Abs Immature Granulocytes: 0.04 10*3/uL (ref 0.00–0.07)
Basophils Absolute: 0.1 10*3/uL (ref 0.0–0.1)
Basophils Relative: 1 %
Eosinophils Absolute: 0.2 10*3/uL (ref 0.0–0.5)
Eosinophils Relative: 3 %
HCT: 31.2 % — ABNORMAL LOW (ref 36.0–46.0)
Hemoglobin: 9.9 g/dL — ABNORMAL LOW (ref 12.0–15.0)
Immature Granulocytes: 1 %
Lymphocytes Relative: 12 %
Lymphs Abs: 1 10*3/uL (ref 0.7–4.0)
MCH: 30.1 pg (ref 26.0–34.0)
MCHC: 31.7 g/dL (ref 30.0–36.0)
MCV: 94.8 fL (ref 80.0–100.0)
Monocytes Absolute: 0.5 10*3/uL (ref 0.1–1.0)
Monocytes Relative: 7 %
Neutro Abs: 6 10*3/uL (ref 1.7–7.7)
Neutrophils Relative %: 76 %
Platelets: 291 10*3/uL (ref 150–400)
RBC: 3.29 MIL/uL — ABNORMAL LOW (ref 3.87–5.11)
RDW: 13.4 % (ref 11.5–15.5)
WBC: 7.8 10*3/uL (ref 4.0–10.5)
nRBC: 0 % (ref 0.0–0.2)

## 2023-02-20 LAB — RESP PANEL BY RT-PCR (RSV, FLU A&B, COVID)  RVPGX2
Influenza A by PCR: NEGATIVE
Influenza B by PCR: NEGATIVE
Resp Syncytial Virus by PCR: NEGATIVE
SARS Coronavirus 2 by RT PCR: NEGATIVE

## 2023-02-20 LAB — COMPREHENSIVE METABOLIC PANEL
ALT: 56 U/L — ABNORMAL HIGH (ref 0–44)
AST: 100 U/L — ABNORMAL HIGH (ref 15–41)
Albumin: 2.8 g/dL — ABNORMAL LOW (ref 3.5–5.0)
Alkaline Phosphatase: 88 U/L (ref 38–126)
Anion gap: 8 (ref 5–15)
BUN: 30 mg/dL — ABNORMAL HIGH (ref 8–23)
CO2: 29 mmol/L (ref 22–32)
Calcium: 9 mg/dL (ref 8.9–10.3)
Chloride: 99 mmol/L (ref 98–111)
Creatinine, Ser: 1.09 mg/dL — ABNORMAL HIGH (ref 0.44–1.00)
GFR, Estimated: 54 mL/min — ABNORMAL LOW (ref >60)
Glucose, Bld: 118 mg/dL — ABNORMAL HIGH (ref 70–99)
Potassium: 3.8 mmol/L (ref 3.5–5.1)
Sodium: 136 mmol/L (ref 135–145)
Total Bilirubin: 1.7 mg/dL — ABNORMAL HIGH (ref 0.3–1.2)
Total Protein: 7.2 g/dL (ref 6.5–8.1)

## 2023-02-20 LAB — SEDIMENTATION RATE: Sed Rate: 120 mm/hr — ABNORMAL HIGH (ref 0–22)

## 2023-02-20 LAB — LACTIC ACID, PLASMA
Lactic Acid, Venous: 1 mmol/L (ref 0.5–1.9)
Lactic Acid, Venous: 0.9 mmol/L (ref 0.5–1.9)

## 2023-02-20 LAB — C-REACTIVE PROTEIN: CRP: 24.6 mg/dL — ABNORMAL HIGH (ref <1.0)

## 2023-02-20 MED ORDER — OXYCODONE HCL 5 MG PO TABS
5.0000 mg | ORAL_TABLET | Freq: Once | ORAL | Status: AC
  Administered 2023-02-20: 5 mg via ORAL
  Filled 2023-02-20: qty 1

## 2023-02-20 NOTE — ED Provider Notes (Signed)
[place] EMERGENCY DEPARTMENT AT [facility] Provider Note   CSN: [id number] Arrival date & time: 02/20/23  1141     History {Add pertinent medical, surgical, social history, OB history to HPI:1} Chief Complaint  Patient presents with   Knee Pain    [name] [name] is a 72 y.o. female.  72 year old female with a history of left knee replacement on 02/13/2023, CAD status post PCI, and CKD who presents to the emergency department with fever and left knee pain.  Patient reports that she had a total left knee replacement on 8/20.  Since then has had some pain and swelling of her left knee.  Says that she was doing well until she developed a fever of 100F and decided to come to the ED for evaluation.        Home Medications Prior to Admission medications   Medication Sig Start Date End Date Taking? Authorizing Provider  acetaminophen (TYLENOL) 500 MG tablet Take 1,000 mg by mouth every 6 (six) hours as needed for moderate pain.    [provider]  albuterol (VENTOLIN HFA) 108 (90 Base) MCG/ACT inhaler Inhale 2 puffs into the lungs every 6 (six) hours as needed for wheezing or shortness of breath.    [provider]  aspirin 81 MG chewable tablet Chew 1 tablet (81 mg total) by mouth 2 (two) times daily. 02/15/23   Kathryne Hitch, MD  atorvastatin (LIPITOR) 40 MG tablet Take 40 mg by mouth daily. 04/28/19   [provider]  cholecalciferol (VITAMIN D3) 25 MCG (1000 UNIT) tablet Take 1,000 Units by mouth daily.    [provider]  famotidine (PEPCID) 20 MG tablet Take 20 mg by mouth daily as needed for heartburn or indigestion.    [provider]  gabapentin (NEURONTIN) 800 MG tablet Take 800 mg by mouth 3 (three) times daily. 08/23/22   [provider]  hydrochlorothiazide (HYDRODIURIL) 25 MG tablet Take 25 mg by mouth daily.    [provider]  lisinopril (PRINIVIL,ZESTRIL) 40 MG tablet Take 40 mg by mouth  daily.    [provider]  metFORMIN (GLUCOPHAGE) 500 MG tablet Take 500 mg by mouth daily. 03/11/15   [provider]  metoprolol tartrate (LOPRESSOR) 25 MG tablet Take 25 mg by mouth 2 (two) times daily. Take for 14 days    [provider]  oxyCODONE (OXY IR/ROXICODONE) 5 MG immediate release tablet Take 1-2 tablets (5-10 mg total) by mouth every 6 (six) hours as needed for moderate pain (pain score 4-6). 02/15/23   Kathryne Hitch, MD  Polyethyl Glycol-Propyl Glycol (LUBRICATING EYE DROPS OP) Place 1 drop into both eyes daily as needed (dry eyes).    [provider]      Allergies    Baclofen and Methocarbamol    Review of Systems   Review of Systems  Physical Exam Updated Vital Signs BP 135/75   Pulse 66   Temp 99.8 F (37.7 C) (Oral)   Resp 19   Ht 5\' 7"  (1.702 m)   Wt 99.8 kg   SpO2 93%   BMI 34.46 kg/m  Physical Exam       ED Results / Procedures / Treatments   Labs (all labs ordered are listed, but only abnormal results are displayed) Labs Reviewed  RESP PANEL BY RT-PCR (RSV, FLU A&B, COVID)  RVPGX2  CULTURE, BLOOD (ROUTINE X 2)  CULTURE, BLOOD (ROUTINE X 2)  CBC WITH DIFFERENTIAL/PLATELET  COMPREHENSIVE METABOLIC PANEL  SEDIMENTATION RATE  C-REACTIVE PROTEIN  LACTIC ACID, PLASMA  LACTIC ACID, PLASMA    EKG None  Radiology No results found.  Procedures Procedures  {Document cardiac monitor, telemetry assessment procedure when appropriate:1}  Medications Ordered in ED Medications - No data to display  ED Course/ Medical Decision Making/ A&P   {   Click here for ABCD2, HEART and other calculatorsREFRESH Note before signing :1}                              Medical Decision Making Amount and/or Complexity of Data Reviewed Labs: ordered. Radiology: ordered.   ***  {Document critical care time when appropriate:1} {Document review of labs and clinical decision tools ie heart score, Chads2Vasc2 etc:1}   {Document your independent review of radiology images, and any outside records:1} {Document your discussion with family members, caretakers, and with consultants:1} {Document social determinants of health affecting pt's care:1} {Document your decision making why or why not admission, treatments were needed:1} Final Clinical Impression(s) / ED Diagnoses Final diagnoses:  None    Rx / DC Orders ED Discharge Orders     None

## 2023-02-20 NOTE — Discharge Instructions (Addendum)
You were seen in the emergency department for your fever.  Your fever had improved. Please take Tylenol for your pain and if you develop fever.  Follow-up with your orthopedic surgeon.  Return to the emergency department if you develop severe knee pain, difficulty breathing, redness of your knee, or any other concerning symptoms.

## 2023-02-20 NOTE — ED Triage Notes (Signed)
Pt bib ems with L knee pain and swelling.EMS reports edema and area is hot to the touch.  Pt had a knee replacement a week ago at [facility]. VSS with ems.

## 2023-02-20 NOTE — ED Notes (Signed)
Attempted to call [facility] rehab x3 with no answer. Report given to transport.

## 2023-02-25 LAB — CULTURE, BLOOD (ROUTINE X 2)
Culture: NO GROWTH
Culture: NO GROWTH
Special Requests: ADEQUATE

## 2023-02-26 DIAGNOSIS — R5381 Other malaise: Secondary | ICD-10-CM | POA: Diagnosis not present

## 2023-02-28 ENCOUNTER — Encounter: Payer: Self-pay | Admitting: Orthopaedic Surgery

## 2023-02-28 ENCOUNTER — Ambulatory Visit (INDEPENDENT_AMBULATORY_CARE_PROVIDER_SITE_OTHER): Payer: Medicare HMO | Admitting: Orthopaedic Surgery

## 2023-02-28 DIAGNOSIS — E039 Hypothyroidism, unspecified: Secondary | ICD-10-CM | POA: Diagnosis not present

## 2023-02-28 DIAGNOSIS — Z96652 Presence of left artificial knee joint: Secondary | ICD-10-CM

## 2023-02-28 DIAGNOSIS — E559 Vitamin D deficiency, unspecified: Secondary | ICD-10-CM | POA: Diagnosis not present

## 2023-02-28 DIAGNOSIS — M6281 Muscle weakness (generalized): Secondary | ICD-10-CM | POA: Diagnosis not present

## 2023-02-28 DIAGNOSIS — L89153 Pressure ulcer of sacral region, stage 3: Secondary | ICD-10-CM | POA: Diagnosis not present

## 2023-02-28 DIAGNOSIS — E1140 Type 2 diabetes mellitus with diabetic neuropathy, unspecified: Secondary | ICD-10-CM | POA: Diagnosis not present

## 2023-02-28 DIAGNOSIS — L89626 Pressure-induced deep tissue damage of left heel: Secondary | ICD-10-CM | POA: Diagnosis not present

## 2023-02-28 DIAGNOSIS — I10 Essential (primary) hypertension: Secondary | ICD-10-CM | POA: Diagnosis not present

## 2023-02-28 DIAGNOSIS — E561 Deficiency of vitamin K: Secondary | ICD-10-CM | POA: Diagnosis not present

## 2023-02-28 DIAGNOSIS — E119 Type 2 diabetes mellitus without complications: Secondary | ICD-10-CM | POA: Diagnosis not present

## 2023-02-28 NOTE — Progress Notes (Signed)
The patient comes in for her first postoperative visit status post a left total knee arthroplasty.  Her son is with her and he is not as pleased with this skilled nursing facility where she is rehabbing compared to the last one that she was out for her right knee.  She has had a really hard time getting around and they have not gotten her up much he states.  On exam her extension is actually good of that left knee but flexion is to maybe 75 degrees.  Her calf is soft.  Her incision looks good and the staples were removed and Steri-Strips applied.  She can stop the knee immobilizer from my standpoint.  The need to work on aggressive range of motion of that knee.  We will see her back in 4 weeks with a repeat exam but no x-rays are needed.

## 2023-03-02 DIAGNOSIS — I10 Essential (primary) hypertension: Secondary | ICD-10-CM | POA: Diagnosis not present

## 2023-03-02 DIAGNOSIS — E119 Type 2 diabetes mellitus without complications: Secondary | ICD-10-CM | POA: Diagnosis not present

## 2023-03-05 DIAGNOSIS — D631 Anemia in chronic kidney disease: Secondary | ICD-10-CM | POA: Diagnosis not present

## 2023-03-05 DIAGNOSIS — E669 Obesity, unspecified: Secondary | ICD-10-CM | POA: Diagnosis not present

## 2023-03-05 DIAGNOSIS — M25552 Pain in left hip: Secondary | ICD-10-CM | POA: Diagnosis not present

## 2023-03-05 DIAGNOSIS — D62 Acute posthemorrhagic anemia: Secondary | ICD-10-CM | POA: Diagnosis not present

## 2023-03-05 DIAGNOSIS — N189 Chronic kidney disease, unspecified: Secondary | ICD-10-CM | POA: Diagnosis not present

## 2023-03-05 DIAGNOSIS — Z96642 Presence of left artificial hip joint: Secondary | ICD-10-CM | POA: Diagnosis not present

## 2023-03-05 DIAGNOSIS — E785 Hyperlipidemia, unspecified: Secondary | ICD-10-CM | POA: Diagnosis not present

## 2023-03-05 DIAGNOSIS — E1122 Type 2 diabetes mellitus with diabetic chronic kidney disease: Secondary | ICD-10-CM | POA: Diagnosis not present

## 2023-03-05 DIAGNOSIS — E559 Vitamin D deficiency, unspecified: Secondary | ICD-10-CM | POA: Diagnosis not present

## 2023-03-06 DIAGNOSIS — Z96652 Presence of left artificial knee joint: Secondary | ICD-10-CM | POA: Diagnosis not present

## 2023-03-06 DIAGNOSIS — M25552 Pain in left hip: Secondary | ICD-10-CM | POA: Diagnosis not present

## 2023-03-06 DIAGNOSIS — I82402 Acute embolism and thrombosis of unspecified deep veins of left lower extremity: Secondary | ICD-10-CM | POA: Diagnosis not present

## 2023-03-06 DIAGNOSIS — I9789 Other postprocedural complications and disorders of the circulatory system, not elsewhere classified: Secondary | ICD-10-CM | POA: Diagnosis not present

## 2023-03-06 DIAGNOSIS — M1612 Unilateral primary osteoarthritis, left hip: Secondary | ICD-10-CM | POA: Diagnosis not present

## 2023-03-07 DIAGNOSIS — M6281 Muscle weakness (generalized): Secondary | ICD-10-CM | POA: Diagnosis not present

## 2023-03-07 DIAGNOSIS — L89626 Pressure-induced deep tissue damage of left heel: Secondary | ICD-10-CM | POA: Diagnosis not present

## 2023-03-07 DIAGNOSIS — E1140 Type 2 diabetes mellitus with diabetic neuropathy, unspecified: Secondary | ICD-10-CM | POA: Diagnosis not present

## 2023-03-12 ENCOUNTER — Telehealth: Payer: Self-pay | Admitting: Orthopaedic Surgery

## 2023-03-12 DIAGNOSIS — E1122 Type 2 diabetes mellitus with diabetic chronic kidney disease: Secondary | ICD-10-CM | POA: Diagnosis not present

## 2023-03-12 DIAGNOSIS — I82402 Acute embolism and thrombosis of unspecified deep veins of left lower extremity: Secondary | ICD-10-CM | POA: Diagnosis not present

## 2023-03-12 DIAGNOSIS — E1140 Type 2 diabetes mellitus with diabetic neuropathy, unspecified: Secondary | ICD-10-CM | POA: Diagnosis not present

## 2023-03-12 NOTE — Telephone Encounter (Signed)
Pt son called in stating his mother is at [facility] on [address] [place], [place], [place] [address] and he stated she is not being treated correctly. Her foot on the left side she had knee surgery on is turning black on the keep from restricted blood flow, and she is getting bed sores on her back from not being rotated and moved. She has been crying that she wants to go to a different facility. Please advise on his next steps to getting her moved please call Millner,Nathaniel (Son) [phone]

## 2023-03-13 DIAGNOSIS — I9789 Other postprocedural complications and disorders of the circulatory system, not elsewhere classified: Secondary | ICD-10-CM | POA: Diagnosis not present

## 2023-03-13 DIAGNOSIS — D62 Acute posthemorrhagic anemia: Secondary | ICD-10-CM | POA: Diagnosis not present

## 2023-03-13 DIAGNOSIS — E1140 Type 2 diabetes mellitus with diabetic neuropathy, unspecified: Secondary | ICD-10-CM | POA: Diagnosis not present

## 2023-03-13 DIAGNOSIS — I82402 Acute embolism and thrombosis of unspecified deep veins of left lower extremity: Secondary | ICD-10-CM | POA: Diagnosis not present

## 2023-03-13 DIAGNOSIS — E1122 Type 2 diabetes mellitus with diabetic chronic kidney disease: Secondary | ICD-10-CM | POA: Diagnosis not present

## 2023-03-13 DIAGNOSIS — M1612 Unilateral primary osteoarthritis, left hip: Secondary | ICD-10-CM | POA: Diagnosis not present

## 2023-03-13 DIAGNOSIS — M25552 Pain in left hip: Secondary | ICD-10-CM | POA: Diagnosis not present

## 2023-03-13 DIAGNOSIS — D631 Anemia in chronic kidney disease: Secondary | ICD-10-CM | POA: Diagnosis not present

## 2023-03-13 DIAGNOSIS — E785 Hyperlipidemia, unspecified: Secondary | ICD-10-CM | POA: Diagnosis not present

## 2023-03-14 DIAGNOSIS — G4700 Insomnia, unspecified: Secondary | ICD-10-CM | POA: Diagnosis not present

## 2023-03-14 DIAGNOSIS — E1140 Type 2 diabetes mellitus with diabetic neuropathy, unspecified: Secondary | ICD-10-CM | POA: Diagnosis not present

## 2023-03-14 DIAGNOSIS — F4321 Adjustment disorder with depressed mood: Secondary | ICD-10-CM | POA: Diagnosis not present

## 2023-03-14 DIAGNOSIS — L89620 Pressure ulcer of left heel, unstageable: Secondary | ICD-10-CM | POA: Diagnosis not present

## 2023-03-14 DIAGNOSIS — M6281 Muscle weakness (generalized): Secondary | ICD-10-CM | POA: Diagnosis not present

## 2023-03-18 DIAGNOSIS — L89322 Pressure ulcer of left buttock, stage 2: Secondary | ICD-10-CM | POA: Diagnosis not present

## 2023-03-18 DIAGNOSIS — M4316 Spondylolisthesis, lumbar region: Secondary | ICD-10-CM | POA: Diagnosis not present

## 2023-03-18 DIAGNOSIS — G8929 Other chronic pain: Secondary | ICD-10-CM | POA: Diagnosis not present

## 2023-03-18 DIAGNOSIS — M4802 Spinal stenosis, cervical region: Secondary | ICD-10-CM | POA: Diagnosis not present

## 2023-03-18 DIAGNOSIS — Z471 Aftercare following joint replacement surgery: Secondary | ICD-10-CM | POA: Diagnosis not present

## 2023-03-18 DIAGNOSIS — M48062 Spinal stenosis, lumbar region with neurogenic claudication: Secondary | ICD-10-CM | POA: Diagnosis not present

## 2023-03-18 DIAGNOSIS — R1031 Right lower quadrant pain: Secondary | ICD-10-CM | POA: Diagnosis not present

## 2023-03-18 DIAGNOSIS — L89620 Pressure ulcer of left heel, unstageable: Secondary | ICD-10-CM | POA: Diagnosis not present

## 2023-03-18 DIAGNOSIS — R1033 Periumbilical pain: Secondary | ICD-10-CM | POA: Diagnosis not present

## 2023-03-19 DIAGNOSIS — M48062 Spinal stenosis, lumbar region with neurogenic claudication: Secondary | ICD-10-CM | POA: Diagnosis not present

## 2023-03-19 DIAGNOSIS — R1031 Right lower quadrant pain: Secondary | ICD-10-CM | POA: Diagnosis not present

## 2023-03-19 DIAGNOSIS — R1033 Periumbilical pain: Secondary | ICD-10-CM | POA: Diagnosis not present

## 2023-03-19 DIAGNOSIS — Z471 Aftercare following joint replacement surgery: Secondary | ICD-10-CM | POA: Diagnosis not present

## 2023-03-19 DIAGNOSIS — M4316 Spondylolisthesis, lumbar region: Secondary | ICD-10-CM | POA: Diagnosis not present

## 2023-03-19 DIAGNOSIS — L89620 Pressure ulcer of left heel, unstageable: Secondary | ICD-10-CM | POA: Diagnosis not present

## 2023-03-19 DIAGNOSIS — M4802 Spinal stenosis, cervical region: Secondary | ICD-10-CM | POA: Diagnosis not present

## 2023-03-19 DIAGNOSIS — G8929 Other chronic pain: Secondary | ICD-10-CM | POA: Diagnosis not present

## 2023-03-19 DIAGNOSIS — L89322 Pressure ulcer of left buttock, stage 2: Secondary | ICD-10-CM | POA: Diagnosis not present

## 2023-03-22 DIAGNOSIS — G8929 Other chronic pain: Secondary | ICD-10-CM | POA: Diagnosis not present

## 2023-03-22 DIAGNOSIS — Z471 Aftercare following joint replacement surgery: Secondary | ICD-10-CM | POA: Diagnosis not present

## 2023-03-22 DIAGNOSIS — R1033 Periumbilical pain: Secondary | ICD-10-CM | POA: Diagnosis not present

## 2023-03-22 DIAGNOSIS — L89322 Pressure ulcer of left buttock, stage 2: Secondary | ICD-10-CM | POA: Diagnosis not present

## 2023-03-22 DIAGNOSIS — R1031 Right lower quadrant pain: Secondary | ICD-10-CM | POA: Diagnosis not present

## 2023-03-22 DIAGNOSIS — M4316 Spondylolisthesis, lumbar region: Secondary | ICD-10-CM | POA: Diagnosis not present

## 2023-03-22 DIAGNOSIS — M48062 Spinal stenosis, lumbar region with neurogenic claudication: Secondary | ICD-10-CM | POA: Diagnosis not present

## 2023-03-22 DIAGNOSIS — M4802 Spinal stenosis, cervical region: Secondary | ICD-10-CM | POA: Diagnosis not present

## 2023-03-22 DIAGNOSIS — L89620 Pressure ulcer of left heel, unstageable: Secondary | ICD-10-CM | POA: Diagnosis not present

## 2023-03-23 DIAGNOSIS — G8929 Other chronic pain: Secondary | ICD-10-CM | POA: Diagnosis not present

## 2023-03-23 DIAGNOSIS — L89620 Pressure ulcer of left heel, unstageable: Secondary | ICD-10-CM | POA: Diagnosis not present

## 2023-03-23 DIAGNOSIS — L89322 Pressure ulcer of left buttock, stage 2: Secondary | ICD-10-CM | POA: Diagnosis not present

## 2023-03-23 DIAGNOSIS — R1031 Right lower quadrant pain: Secondary | ICD-10-CM | POA: Diagnosis not present

## 2023-03-23 DIAGNOSIS — Z471 Aftercare following joint replacement surgery: Secondary | ICD-10-CM | POA: Diagnosis not present

## 2023-03-23 DIAGNOSIS — M4316 Spondylolisthesis, lumbar region: Secondary | ICD-10-CM | POA: Diagnosis not present

## 2023-03-23 DIAGNOSIS — R1033 Periumbilical pain: Secondary | ICD-10-CM | POA: Diagnosis not present

## 2023-03-23 DIAGNOSIS — M48062 Spinal stenosis, lumbar region with neurogenic claudication: Secondary | ICD-10-CM | POA: Diagnosis not present

## 2023-03-23 DIAGNOSIS — M4802 Spinal stenosis, cervical region: Secondary | ICD-10-CM | POA: Diagnosis not present

## 2023-03-26 DIAGNOSIS — M48062 Spinal stenosis, lumbar region with neurogenic claudication: Secondary | ICD-10-CM | POA: Diagnosis not present

## 2023-03-26 DIAGNOSIS — R1031 Right lower quadrant pain: Secondary | ICD-10-CM | POA: Diagnosis not present

## 2023-03-26 DIAGNOSIS — L89620 Pressure ulcer of left heel, unstageable: Secondary | ICD-10-CM | POA: Diagnosis not present

## 2023-03-26 DIAGNOSIS — R1033 Periumbilical pain: Secondary | ICD-10-CM | POA: Diagnosis not present

## 2023-03-26 DIAGNOSIS — M4802 Spinal stenosis, cervical region: Secondary | ICD-10-CM | POA: Diagnosis not present

## 2023-03-26 DIAGNOSIS — Z471 Aftercare following joint replacement surgery: Secondary | ICD-10-CM | POA: Diagnosis not present

## 2023-03-26 DIAGNOSIS — G8929 Other chronic pain: Secondary | ICD-10-CM | POA: Diagnosis not present

## 2023-03-26 DIAGNOSIS — M4316 Spondylolisthesis, lumbar region: Secondary | ICD-10-CM | POA: Diagnosis not present

## 2023-03-26 DIAGNOSIS — L89322 Pressure ulcer of left buttock, stage 2: Secondary | ICD-10-CM | POA: Diagnosis not present

## 2023-03-28 ENCOUNTER — Encounter: Payer: Self-pay | Admitting: Orthopaedic Surgery

## 2023-03-28 ENCOUNTER — Ambulatory Visit: Payer: Medicare HMO | Admitting: Orthopaedic Surgery

## 2023-03-28 DIAGNOSIS — G8929 Other chronic pain: Secondary | ICD-10-CM | POA: Diagnosis not present

## 2023-03-28 DIAGNOSIS — M48062 Spinal stenosis, lumbar region with neurogenic claudication: Secondary | ICD-10-CM | POA: Diagnosis not present

## 2023-03-28 DIAGNOSIS — Z471 Aftercare following joint replacement surgery: Secondary | ICD-10-CM | POA: Diagnosis not present

## 2023-03-28 DIAGNOSIS — R1033 Periumbilical pain: Secondary | ICD-10-CM | POA: Diagnosis not present

## 2023-03-28 DIAGNOSIS — L89620 Pressure ulcer of left heel, unstageable: Secondary | ICD-10-CM | POA: Diagnosis not present

## 2023-03-28 DIAGNOSIS — M4316 Spondylolisthesis, lumbar region: Secondary | ICD-10-CM | POA: Diagnosis not present

## 2023-03-28 DIAGNOSIS — R1031 Right lower quadrant pain: Secondary | ICD-10-CM | POA: Diagnosis not present

## 2023-03-28 DIAGNOSIS — Z96652 Presence of left artificial knee joint: Secondary | ICD-10-CM

## 2023-03-28 DIAGNOSIS — M4802 Spinal stenosis, cervical region: Secondary | ICD-10-CM | POA: Diagnosis not present

## 2023-03-28 DIAGNOSIS — L89322 Pressure ulcer of left buttock, stage 2: Secondary | ICD-10-CM | POA: Diagnosis not present

## 2023-03-28 NOTE — Progress Notes (Signed)
HPI: Mrs. Dhami returns today 6 weeks status post left total knee arthroplasty.  She states overall she is doing a lot better since she has been discharged from the skilled facility.  She is working with home physical therapy.  Ranks her pain to be 7 out of 10 at worst.  She is only taking Tylenol for the pain.  She is now on no anticoagulants.  She reports that she has been applying Betadine to her heel ulceration.  Reports she ambulates with a rolling walker.  She is accompanied by her son.  Review of systems: See HPI otherwise negative  Physical exam: General Well-developed well-nourished female no acute distress seated in wheelchair.  Left knee: Full extension flexion 90 to 95 degrees active and passively.  Calf supple nontender.  Surgical incisions well-healed no signs of infection.  No abnormal warmth erythema.  Calf supple nontender.  Dorsiflexion plantarflexion left ankle intact.  Large heel ulcer grade 1 with eschar.  There is no drainage.  No malodor.  Achilles is intact.  Impression: Status post left total knee arthroplasty 02/13/2023 Left heel ulcer  Plan: She will continue to work with home therapy on range of motion and strengthening knee.  Advised her and her son how to elevate the heel and also work on extension of the knee.  Xeroform dressing was applied with OpSite Webril and Ace bandage.  She will leave this intact for 3 days.  Then she will remove the dressing and then begin soaking the heel and warm Dial soap soaks for 15 to 20 minutes.  She is advised not to remove the eschar just let it fall off naturally.  Will see her back in just 2 weeks to see how she is doing overall.  She will continue Tylenol for pain.  Questions encouraged and answered by Dr. Magnus Ivan myself.

## 2023-03-30 DIAGNOSIS — M48062 Spinal stenosis, lumbar region with neurogenic claudication: Secondary | ICD-10-CM | POA: Diagnosis not present

## 2023-03-30 DIAGNOSIS — R1031 Right lower quadrant pain: Secondary | ICD-10-CM | POA: Diagnosis not present

## 2023-03-30 DIAGNOSIS — L89322 Pressure ulcer of left buttock, stage 2: Secondary | ICD-10-CM | POA: Diagnosis not present

## 2023-03-30 DIAGNOSIS — L89620 Pressure ulcer of left heel, unstageable: Secondary | ICD-10-CM | POA: Diagnosis not present

## 2023-03-30 DIAGNOSIS — Z471 Aftercare following joint replacement surgery: Secondary | ICD-10-CM | POA: Diagnosis not present

## 2023-03-30 DIAGNOSIS — M4316 Spondylolisthesis, lumbar region: Secondary | ICD-10-CM | POA: Diagnosis not present

## 2023-03-30 DIAGNOSIS — R1033 Periumbilical pain: Secondary | ICD-10-CM | POA: Diagnosis not present

## 2023-03-30 DIAGNOSIS — M4802 Spinal stenosis, cervical region: Secondary | ICD-10-CM | POA: Diagnosis not present

## 2023-03-30 DIAGNOSIS — G8929 Other chronic pain: Secondary | ICD-10-CM | POA: Diagnosis not present

## 2023-04-03 DIAGNOSIS — M4802 Spinal stenosis, cervical region: Secondary | ICD-10-CM | POA: Diagnosis not present

## 2023-04-03 DIAGNOSIS — R1033 Periumbilical pain: Secondary | ICD-10-CM | POA: Diagnosis not present

## 2023-04-03 DIAGNOSIS — G8929 Other chronic pain: Secondary | ICD-10-CM | POA: Diagnosis not present

## 2023-04-03 DIAGNOSIS — L89322 Pressure ulcer of left buttock, stage 2: Secondary | ICD-10-CM | POA: Diagnosis not present

## 2023-04-03 DIAGNOSIS — M4316 Spondylolisthesis, lumbar region: Secondary | ICD-10-CM | POA: Diagnosis not present

## 2023-04-03 DIAGNOSIS — R1031 Right lower quadrant pain: Secondary | ICD-10-CM | POA: Diagnosis not present

## 2023-04-03 DIAGNOSIS — Z471 Aftercare following joint replacement surgery: Secondary | ICD-10-CM | POA: Diagnosis not present

## 2023-04-03 DIAGNOSIS — L89620 Pressure ulcer of left heel, unstageable: Secondary | ICD-10-CM | POA: Diagnosis not present

## 2023-04-03 DIAGNOSIS — M48062 Spinal stenosis, lumbar region with neurogenic claudication: Secondary | ICD-10-CM | POA: Diagnosis not present

## 2023-04-05 DIAGNOSIS — R1033 Periumbilical pain: Secondary | ICD-10-CM | POA: Diagnosis not present

## 2023-04-05 DIAGNOSIS — L89322 Pressure ulcer of left buttock, stage 2: Secondary | ICD-10-CM | POA: Diagnosis not present

## 2023-04-05 DIAGNOSIS — L89620 Pressure ulcer of left heel, unstageable: Secondary | ICD-10-CM | POA: Diagnosis not present

## 2023-04-05 DIAGNOSIS — R1031 Right lower quadrant pain: Secondary | ICD-10-CM | POA: Diagnosis not present

## 2023-04-05 DIAGNOSIS — M4802 Spinal stenosis, cervical region: Secondary | ICD-10-CM | POA: Diagnosis not present

## 2023-04-05 DIAGNOSIS — M48062 Spinal stenosis, lumbar region with neurogenic claudication: Secondary | ICD-10-CM | POA: Diagnosis not present

## 2023-04-05 DIAGNOSIS — Z471 Aftercare following joint replacement surgery: Secondary | ICD-10-CM | POA: Diagnosis not present

## 2023-04-05 DIAGNOSIS — G8929 Other chronic pain: Secondary | ICD-10-CM | POA: Diagnosis not present

## 2023-04-05 DIAGNOSIS — M4316 Spondylolisthesis, lumbar region: Secondary | ICD-10-CM | POA: Diagnosis not present

## 2023-04-09 DIAGNOSIS — Z471 Aftercare following joint replacement surgery: Secondary | ICD-10-CM | POA: Diagnosis not present

## 2023-04-09 DIAGNOSIS — L89620 Pressure ulcer of left heel, unstageable: Secondary | ICD-10-CM | POA: Diagnosis not present

## 2023-04-09 DIAGNOSIS — L89322 Pressure ulcer of left buttock, stage 2: Secondary | ICD-10-CM | POA: Diagnosis not present

## 2023-04-09 DIAGNOSIS — G8929 Other chronic pain: Secondary | ICD-10-CM | POA: Diagnosis not present

## 2023-04-09 DIAGNOSIS — M4802 Spinal stenosis, cervical region: Secondary | ICD-10-CM | POA: Diagnosis not present

## 2023-04-09 DIAGNOSIS — M4316 Spondylolisthesis, lumbar region: Secondary | ICD-10-CM | POA: Diagnosis not present

## 2023-04-09 DIAGNOSIS — R1033 Periumbilical pain: Secondary | ICD-10-CM | POA: Diagnosis not present

## 2023-04-09 DIAGNOSIS — M48062 Spinal stenosis, lumbar region with neurogenic claudication: Secondary | ICD-10-CM | POA: Diagnosis not present

## 2023-04-09 DIAGNOSIS — R1031 Right lower quadrant pain: Secondary | ICD-10-CM | POA: Diagnosis not present

## 2023-04-11 DIAGNOSIS — M4802 Spinal stenosis, cervical region: Secondary | ICD-10-CM | POA: Diagnosis not present

## 2023-04-11 DIAGNOSIS — Z471 Aftercare following joint replacement surgery: Secondary | ICD-10-CM | POA: Diagnosis not present

## 2023-04-11 DIAGNOSIS — R1031 Right lower quadrant pain: Secondary | ICD-10-CM | POA: Diagnosis not present

## 2023-04-11 DIAGNOSIS — M48062 Spinal stenosis, lumbar region with neurogenic claudication: Secondary | ICD-10-CM | POA: Diagnosis not present

## 2023-04-11 DIAGNOSIS — R1033 Periumbilical pain: Secondary | ICD-10-CM | POA: Diagnosis not present

## 2023-04-11 DIAGNOSIS — G8929 Other chronic pain: Secondary | ICD-10-CM | POA: Diagnosis not present

## 2023-04-11 DIAGNOSIS — L89620 Pressure ulcer of left heel, unstageable: Secondary | ICD-10-CM | POA: Diagnosis not present

## 2023-04-11 DIAGNOSIS — L89322 Pressure ulcer of left buttock, stage 2: Secondary | ICD-10-CM | POA: Diagnosis not present

## 2023-04-11 DIAGNOSIS — M4316 Spondylolisthesis, lumbar region: Secondary | ICD-10-CM | POA: Diagnosis not present

## 2023-04-12 ENCOUNTER — Ambulatory Visit: Payer: Medicare HMO | Admitting: Physician Assistant

## 2023-04-12 ENCOUNTER — Encounter: Payer: Self-pay | Admitting: Physician Assistant

## 2023-04-12 ENCOUNTER — Telehealth: Payer: Self-pay | Admitting: *Deleted

## 2023-04-12 DIAGNOSIS — Z96652 Presence of left artificial knee joint: Secondary | ICD-10-CM | POA: Diagnosis not present

## 2023-04-12 NOTE — Progress Notes (Signed)
HPI: [name] [name] returns today status post left total knee arthroplasty 02/13/2023.  She states that she is overall trending towards improvement in regards to her left knee.  She is still doing home health PT to take Tylenol for pain.  She mostly has pain at night.  Most of her pain however seems to be coming from her heel which she has in the area of skin breakdown and eschar.  She states that she has been referred to the wound center by her primary care physician.  She is back to using Betadine on the wound.  She has been given a pad for the back of her heel.  She continues to float her heels.   Physical exam: Left knee surgical incisions well-healed calf supple nontender.  Full extension flexion to approximately 100 degrees.  Impression: Status post left total knee arthroplasty  Plan: Will set her up for physical therapy at [facility] therapy per her request.  She will continue to work on range of motion strengthening.  Follow-up with Dr. Magnus Ivan in 4 weeks sooner if there is any questions concerns.  Questions were encouraged and answered by  Ilda Mori and myself .

## 2023-04-12 NOTE — Telephone Encounter (Signed)
Met with patient in office today- is receiving HHPT after several weeks at [facility]. She has flexion of about 95 degrees currently. Order for OPPT at [facility] sent. Wound center referral completed by her PCP, which should be calling soon from [facility] in [place] to schedule in the next few days. Spoke with [facility] to make sure they have completed treatment and discharged from services by end of next week so wound center and OPPT can begin.

## 2023-04-13 DIAGNOSIS — M4802 Spinal stenosis, cervical region: Secondary | ICD-10-CM | POA: Diagnosis not present

## 2023-04-13 DIAGNOSIS — L89322 Pressure ulcer of left buttock, stage 2: Secondary | ICD-10-CM | POA: Diagnosis not present

## 2023-04-13 DIAGNOSIS — R1033 Periumbilical pain: Secondary | ICD-10-CM | POA: Diagnosis not present

## 2023-04-13 DIAGNOSIS — G8929 Other chronic pain: Secondary | ICD-10-CM | POA: Diagnosis not present

## 2023-04-13 DIAGNOSIS — M4316 Spondylolisthesis, lumbar region: Secondary | ICD-10-CM | POA: Diagnosis not present

## 2023-04-13 DIAGNOSIS — Z471 Aftercare following joint replacement surgery: Secondary | ICD-10-CM | POA: Diagnosis not present

## 2023-04-13 DIAGNOSIS — M48062 Spinal stenosis, lumbar region with neurogenic claudication: Secondary | ICD-10-CM | POA: Diagnosis not present

## 2023-04-13 DIAGNOSIS — L89620 Pressure ulcer of left heel, unstageable: Secondary | ICD-10-CM | POA: Diagnosis not present

## 2023-04-13 DIAGNOSIS — R1031 Right lower quadrant pain: Secondary | ICD-10-CM | POA: Diagnosis not present

## 2023-04-15 DIAGNOSIS — M1712 Unilateral primary osteoarthritis, left knee: Secondary | ICD-10-CM | POA: Diagnosis not present

## 2023-04-15 DIAGNOSIS — I82402 Acute embolism and thrombosis of unspecified deep veins of left lower extremity: Secondary | ICD-10-CM | POA: Diagnosis not present

## 2023-04-15 DIAGNOSIS — E7849 Other hyperlipidemia: Secondary | ICD-10-CM | POA: Diagnosis not present

## 2023-04-15 DIAGNOSIS — E1140 Type 2 diabetes mellitus with diabetic neuropathy, unspecified: Secondary | ICD-10-CM | POA: Diagnosis not present

## 2023-04-15 DIAGNOSIS — L89620 Pressure ulcer of left heel, unstageable: Secondary | ICD-10-CM | POA: Diagnosis not present

## 2023-04-15 DIAGNOSIS — N1831 Chronic kidney disease, stage 3a: Secondary | ICD-10-CM | POA: Diagnosis not present

## 2023-04-15 DIAGNOSIS — Z96652 Presence of left artificial knee joint: Secondary | ICD-10-CM | POA: Diagnosis not present

## 2023-04-15 DIAGNOSIS — L89322 Pressure ulcer of left buttock, stage 2: Secondary | ICD-10-CM | POA: Diagnosis not present

## 2023-04-16 DIAGNOSIS — I639 Cerebral infarction, unspecified: Secondary | ICD-10-CM | POA: Diagnosis not present

## 2023-04-16 DIAGNOSIS — I6782 Cerebral ischemia: Secondary | ICD-10-CM | POA: Diagnosis not present

## 2023-04-16 DIAGNOSIS — I214 Non-ST elevation (NSTEMI) myocardial infarction: Secondary | ICD-10-CM | POA: Diagnosis not present

## 2023-04-16 DIAGNOSIS — R000 Tachycardia, unspecified: Secondary | ICD-10-CM | POA: Diagnosis not present

## 2023-04-16 DIAGNOSIS — R0902 Hypoxemia: Secondary | ICD-10-CM | POA: Diagnosis not present

## 2023-04-16 DIAGNOSIS — R519 Headache, unspecified: Secondary | ICD-10-CM | POA: Diagnosis not present

## 2023-04-16 DIAGNOSIS — G9389 Other specified disorders of brain: Secondary | ICD-10-CM | POA: Diagnosis not present

## 2023-04-16 DIAGNOSIS — R4182 Altered mental status, unspecified: Secondary | ICD-10-CM | POA: Diagnosis not present

## 2023-04-16 DIAGNOSIS — R0989 Other specified symptoms and signs involving the circulatory and respiratory systems: Secondary | ICD-10-CM | POA: Diagnosis not present

## 2023-04-16 DIAGNOSIS — S0990XA Unspecified injury of head, initial encounter: Secondary | ICD-10-CM | POA: Diagnosis not present

## 2023-04-16 DIAGNOSIS — R4702 Dysphasia: Secondary | ICD-10-CM | POA: Diagnosis not present

## 2023-04-16 DIAGNOSIS — R9389 Abnormal findings on diagnostic imaging of other specified body structures: Secondary | ICD-10-CM | POA: Diagnosis not present

## 2023-04-16 DIAGNOSIS — R4701 Aphasia: Secondary | ICD-10-CM | POA: Diagnosis not present

## 2023-04-16 DIAGNOSIS — I517 Cardiomegaly: Secondary | ICD-10-CM | POA: Diagnosis not present

## 2023-04-17 DIAGNOSIS — G8929 Other chronic pain: Secondary | ICD-10-CM | POA: Diagnosis not present

## 2023-04-17 DIAGNOSIS — S31809A Unspecified open wound of unspecified buttock, initial encounter: Secondary | ICD-10-CM | POA: Diagnosis not present

## 2023-04-17 DIAGNOSIS — R4182 Altered mental status, unspecified: Secondary | ICD-10-CM | POA: Diagnosis not present

## 2023-04-17 DIAGNOSIS — I214 Non-ST elevation (NSTEMI) myocardial infarction: Secondary | ICD-10-CM | POA: Diagnosis not present

## 2023-04-17 DIAGNOSIS — I6523 Occlusion and stenosis of bilateral carotid arteries: Secondary | ICD-10-CM | POA: Diagnosis not present

## 2023-04-17 DIAGNOSIS — L89322 Pressure ulcer of left buttock, stage 2: Secondary | ICD-10-CM | POA: Diagnosis not present

## 2023-04-17 DIAGNOSIS — M4802 Spinal stenosis, cervical region: Secondary | ICD-10-CM | POA: Diagnosis not present

## 2023-04-17 DIAGNOSIS — Z23 Encounter for immunization: Secondary | ICD-10-CM | POA: Diagnosis not present

## 2023-04-17 DIAGNOSIS — M19072 Primary osteoarthritis, left ankle and foot: Secondary | ICD-10-CM | POA: Diagnosis not present

## 2023-04-17 DIAGNOSIS — S91302A Unspecified open wound, left foot, initial encounter: Secondary | ICD-10-CM | POA: Diagnosis not present

## 2023-04-17 DIAGNOSIS — D631 Anemia in chronic kidney disease: Secondary | ICD-10-CM | POA: Diagnosis not present

## 2023-04-17 DIAGNOSIS — M48062 Spinal stenosis, lumbar region with neurogenic claudication: Secondary | ICD-10-CM | POA: Diagnosis not present

## 2023-04-17 DIAGNOSIS — I129 Hypertensive chronic kidney disease with stage 1 through stage 4 chronic kidney disease, or unspecified chronic kidney disease: Secondary | ICD-10-CM | POA: Diagnosis not present

## 2023-04-17 DIAGNOSIS — M7662 Achilles tendinitis, left leg: Secondary | ICD-10-CM | POA: Diagnosis not present

## 2023-04-17 DIAGNOSIS — R410 Disorientation, unspecified: Secondary | ICD-10-CM | POA: Diagnosis not present

## 2023-04-17 DIAGNOSIS — E785 Hyperlipidemia, unspecified: Secondary | ICD-10-CM | POA: Diagnosis not present

## 2023-04-17 DIAGNOSIS — N179 Acute kidney failure, unspecified: Secondary | ICD-10-CM | POA: Diagnosis not present

## 2023-04-17 DIAGNOSIS — M7732 Calcaneal spur, left foot: Secondary | ICD-10-CM | POA: Diagnosis not present

## 2023-04-17 DIAGNOSIS — Z471 Aftercare following joint replacement surgery: Secondary | ICD-10-CM | POA: Diagnosis not present

## 2023-04-17 DIAGNOSIS — K5730 Diverticulosis of large intestine without perforation or abscess without bleeding: Secondary | ICD-10-CM | POA: Diagnosis not present

## 2023-04-17 DIAGNOSIS — L89620 Pressure ulcer of left heel, unstageable: Secondary | ICD-10-CM | POA: Diagnosis not present

## 2023-04-17 DIAGNOSIS — M4316 Spondylolisthesis, lumbar region: Secondary | ICD-10-CM | POA: Diagnosis not present

## 2023-04-17 DIAGNOSIS — R1033 Periumbilical pain: Secondary | ICD-10-CM | POA: Diagnosis not present

## 2023-04-17 DIAGNOSIS — I080 Rheumatic disorders of both mitral and aortic valves: Secondary | ICD-10-CM | POA: Diagnosis not present

## 2023-04-17 DIAGNOSIS — N183 Chronic kidney disease, stage 3 unspecified: Secondary | ICD-10-CM | POA: Diagnosis not present

## 2023-04-17 DIAGNOSIS — R569 Unspecified convulsions: Secondary | ICD-10-CM | POA: Diagnosis not present

## 2023-04-17 DIAGNOSIS — I824Y2 Acute embolism and thrombosis of unspecified deep veins of left proximal lower extremity: Secondary | ICD-10-CM | POA: Diagnosis not present

## 2023-04-17 DIAGNOSIS — K5900 Constipation, unspecified: Secondary | ICD-10-CM | POA: Diagnosis not present

## 2023-04-17 DIAGNOSIS — I700 Atherosclerosis of aorta: Secondary | ICD-10-CM | POA: Diagnosis not present

## 2023-04-17 DIAGNOSIS — R1031 Right lower quadrant pain: Secondary | ICD-10-CM | POA: Diagnosis not present

## 2023-04-17 DIAGNOSIS — G9341 Metabolic encephalopathy: Secondary | ICD-10-CM | POA: Diagnosis not present

## 2023-04-17 DIAGNOSIS — G9340 Encephalopathy, unspecified: Secondary | ICD-10-CM | POA: Diagnosis not present

## 2023-04-17 DIAGNOSIS — I82402 Acute embolism and thrombosis of unspecified deep veins of left lower extremity: Secondary | ICD-10-CM | POA: Diagnosis not present

## 2023-04-17 DIAGNOSIS — M6281 Muscle weakness (generalized): Secondary | ICD-10-CM | POA: Diagnosis not present

## 2023-04-18 DIAGNOSIS — N179 Acute kidney failure, unspecified: Secondary | ICD-10-CM | POA: Diagnosis not present

## 2023-04-18 DIAGNOSIS — D631 Anemia in chronic kidney disease: Secondary | ICD-10-CM | POA: Diagnosis not present

## 2023-04-18 DIAGNOSIS — S91302A Unspecified open wound, left foot, initial encounter: Secondary | ICD-10-CM | POA: Diagnosis not present

## 2023-04-18 DIAGNOSIS — N183 Chronic kidney disease, stage 3 unspecified: Secondary | ICD-10-CM | POA: Diagnosis not present

## 2023-04-18 DIAGNOSIS — S31809A Unspecified open wound of unspecified buttock, initial encounter: Secondary | ICD-10-CM | POA: Diagnosis not present

## 2023-04-18 DIAGNOSIS — R410 Disorientation, unspecified: Secondary | ICD-10-CM | POA: Diagnosis not present

## 2023-04-18 DIAGNOSIS — I824Y2 Acute embolism and thrombosis of unspecified deep veins of left proximal lower extremity: Secondary | ICD-10-CM | POA: Diagnosis not present

## 2023-04-18 DIAGNOSIS — E785 Hyperlipidemia, unspecified: Secondary | ICD-10-CM | POA: Diagnosis not present

## 2023-04-18 DIAGNOSIS — I129 Hypertensive chronic kidney disease with stage 1 through stage 4 chronic kidney disease, or unspecified chronic kidney disease: Secondary | ICD-10-CM | POA: Diagnosis not present

## 2023-04-18 DIAGNOSIS — G9340 Encephalopathy, unspecified: Secondary | ICD-10-CM | POA: Diagnosis not present

## 2023-04-18 DIAGNOSIS — G9341 Metabolic encephalopathy: Secondary | ICD-10-CM | POA: Diagnosis not present

## 2023-04-18 DIAGNOSIS — I6781 Acute cerebrovascular insufficiency: Secondary | ICD-10-CM | POA: Diagnosis not present

## 2023-04-18 DIAGNOSIS — I82402 Acute embolism and thrombosis of unspecified deep veins of left lower extremity: Secondary | ICD-10-CM | POA: Diagnosis not present

## 2023-04-18 DIAGNOSIS — G319 Degenerative disease of nervous system, unspecified: Secondary | ICD-10-CM | POA: Diagnosis not present

## 2023-04-18 DIAGNOSIS — Z23 Encounter for immunization: Secondary | ICD-10-CM | POA: Diagnosis not present

## 2023-04-18 DIAGNOSIS — I639 Cerebral infarction, unspecified: Secondary | ICD-10-CM | POA: Diagnosis not present

## 2023-04-19 DIAGNOSIS — N183 Chronic kidney disease, stage 3 unspecified: Secondary | ICD-10-CM | POA: Diagnosis not present

## 2023-04-19 DIAGNOSIS — S31809A Unspecified open wound of unspecified buttock, initial encounter: Secondary | ICD-10-CM | POA: Diagnosis not present

## 2023-04-19 DIAGNOSIS — I129 Hypertensive chronic kidney disease with stage 1 through stage 4 chronic kidney disease, or unspecified chronic kidney disease: Secondary | ICD-10-CM | POA: Diagnosis not present

## 2023-04-19 DIAGNOSIS — N179 Acute kidney failure, unspecified: Secondary | ICD-10-CM | POA: Diagnosis not present

## 2023-04-19 DIAGNOSIS — I82402 Acute embolism and thrombosis of unspecified deep veins of left lower extremity: Secondary | ICD-10-CM | POA: Diagnosis not present

## 2023-04-19 DIAGNOSIS — R410 Disorientation, unspecified: Secondary | ICD-10-CM | POA: Diagnosis not present

## 2023-04-19 DIAGNOSIS — G9341 Metabolic encephalopathy: Secondary | ICD-10-CM | POA: Diagnosis not present

## 2023-04-19 DIAGNOSIS — D631 Anemia in chronic kidney disease: Secondary | ICD-10-CM | POA: Diagnosis not present

## 2023-04-19 DIAGNOSIS — Z23 Encounter for immunization: Secondary | ICD-10-CM | POA: Diagnosis not present

## 2023-04-19 DIAGNOSIS — I824Y2 Acute embolism and thrombosis of unspecified deep veins of left proximal lower extremity: Secondary | ICD-10-CM | POA: Diagnosis not present

## 2023-04-19 DIAGNOSIS — E785 Hyperlipidemia, unspecified: Secondary | ICD-10-CM | POA: Diagnosis not present

## 2023-04-19 DIAGNOSIS — S91302A Unspecified open wound, left foot, initial encounter: Secondary | ICD-10-CM | POA: Diagnosis not present

## 2023-04-20 DIAGNOSIS — N179 Acute kidney failure, unspecified: Secondary | ICD-10-CM | POA: Diagnosis not present

## 2023-04-20 DIAGNOSIS — I824Y2 Acute embolism and thrombosis of unspecified deep veins of left proximal lower extremity: Secondary | ICD-10-CM | POA: Diagnosis not present

## 2023-04-20 DIAGNOSIS — N183 Chronic kidney disease, stage 3 unspecified: Secondary | ICD-10-CM | POA: Diagnosis not present

## 2023-04-20 DIAGNOSIS — E785 Hyperlipidemia, unspecified: Secondary | ICD-10-CM | POA: Diagnosis not present

## 2023-04-20 DIAGNOSIS — I82402 Acute embolism and thrombosis of unspecified deep veins of left lower extremity: Secondary | ICD-10-CM | POA: Diagnosis not present

## 2023-04-20 DIAGNOSIS — G9340 Encephalopathy, unspecified: Secondary | ICD-10-CM | POA: Diagnosis not present

## 2023-04-20 DIAGNOSIS — I129 Hypertensive chronic kidney disease with stage 1 through stage 4 chronic kidney disease, or unspecified chronic kidney disease: Secondary | ICD-10-CM | POA: Diagnosis not present

## 2023-04-20 DIAGNOSIS — D631 Anemia in chronic kidney disease: Secondary | ICD-10-CM | POA: Diagnosis not present

## 2023-04-20 DIAGNOSIS — G9341 Metabolic encephalopathy: Secondary | ICD-10-CM | POA: Diagnosis not present

## 2023-04-20 DIAGNOSIS — S91302A Unspecified open wound, left foot, initial encounter: Secondary | ICD-10-CM | POA: Diagnosis not present

## 2023-04-20 DIAGNOSIS — R410 Disorientation, unspecified: Secondary | ICD-10-CM | POA: Diagnosis not present

## 2023-04-20 DIAGNOSIS — Z23 Encounter for immunization: Secondary | ICD-10-CM | POA: Diagnosis not present

## 2023-04-21 DIAGNOSIS — R410 Disorientation, unspecified: Secondary | ICD-10-CM | POA: Diagnosis not present

## 2023-04-21 DIAGNOSIS — E785 Hyperlipidemia, unspecified: Secondary | ICD-10-CM | POA: Diagnosis not present

## 2023-04-21 DIAGNOSIS — Z23 Encounter for immunization: Secondary | ICD-10-CM | POA: Diagnosis not present

## 2023-04-21 DIAGNOSIS — N179 Acute kidney failure, unspecified: Secondary | ICD-10-CM | POA: Diagnosis not present

## 2023-04-21 DIAGNOSIS — N183 Chronic kidney disease, stage 3 unspecified: Secondary | ICD-10-CM | POA: Diagnosis not present

## 2023-04-21 DIAGNOSIS — D631 Anemia in chronic kidney disease: Secondary | ICD-10-CM | POA: Diagnosis not present

## 2023-04-21 DIAGNOSIS — I824Y2 Acute embolism and thrombosis of unspecified deep veins of left proximal lower extremity: Secondary | ICD-10-CM | POA: Diagnosis not present

## 2023-04-21 DIAGNOSIS — G9340 Encephalopathy, unspecified: Secondary | ICD-10-CM | POA: Diagnosis not present

## 2023-04-21 DIAGNOSIS — S31809A Unspecified open wound of unspecified buttock, initial encounter: Secondary | ICD-10-CM | POA: Diagnosis not present

## 2023-04-21 DIAGNOSIS — G9341 Metabolic encephalopathy: Secondary | ICD-10-CM | POA: Diagnosis not present

## 2023-04-21 DIAGNOSIS — I129 Hypertensive chronic kidney disease with stage 1 through stage 4 chronic kidney disease, or unspecified chronic kidney disease: Secondary | ICD-10-CM | POA: Diagnosis not present

## 2023-04-22 DIAGNOSIS — E785 Hyperlipidemia, unspecified: Secondary | ICD-10-CM | POA: Diagnosis not present

## 2023-04-22 DIAGNOSIS — R410 Disorientation, unspecified: Secondary | ICD-10-CM | POA: Diagnosis not present

## 2023-04-22 DIAGNOSIS — I129 Hypertensive chronic kidney disease with stage 1 through stage 4 chronic kidney disease, or unspecified chronic kidney disease: Secondary | ICD-10-CM | POA: Diagnosis not present

## 2023-04-22 DIAGNOSIS — S31809A Unspecified open wound of unspecified buttock, initial encounter: Secondary | ICD-10-CM | POA: Diagnosis not present

## 2023-04-22 DIAGNOSIS — Z23 Encounter for immunization: Secondary | ICD-10-CM | POA: Diagnosis not present

## 2023-04-22 DIAGNOSIS — I824Y2 Acute embolism and thrombosis of unspecified deep veins of left proximal lower extremity: Secondary | ICD-10-CM | POA: Diagnosis not present

## 2023-04-22 DIAGNOSIS — G9340 Encephalopathy, unspecified: Secondary | ICD-10-CM | POA: Diagnosis not present

## 2023-04-22 DIAGNOSIS — G9341 Metabolic encephalopathy: Secondary | ICD-10-CM | POA: Diagnosis not present

## 2023-04-22 DIAGNOSIS — D631 Anemia in chronic kidney disease: Secondary | ICD-10-CM | POA: Diagnosis not present

## 2023-04-22 DIAGNOSIS — N179 Acute kidney failure, unspecified: Secondary | ICD-10-CM | POA: Diagnosis not present

## 2023-04-22 DIAGNOSIS — N183 Chronic kidney disease, stage 3 unspecified: Secondary | ICD-10-CM | POA: Diagnosis not present

## 2023-04-23 DIAGNOSIS — E785 Hyperlipidemia, unspecified: Secondary | ICD-10-CM | POA: Diagnosis not present

## 2023-04-23 DIAGNOSIS — D631 Anemia in chronic kidney disease: Secondary | ICD-10-CM | POA: Diagnosis not present

## 2023-04-23 DIAGNOSIS — Z23 Encounter for immunization: Secondary | ICD-10-CM | POA: Diagnosis not present

## 2023-04-23 DIAGNOSIS — R410 Disorientation, unspecified: Secondary | ICD-10-CM | POA: Diagnosis not present

## 2023-04-23 DIAGNOSIS — I129 Hypertensive chronic kidney disease with stage 1 through stage 4 chronic kidney disease, or unspecified chronic kidney disease: Secondary | ICD-10-CM | POA: Diagnosis not present

## 2023-04-23 DIAGNOSIS — N179 Acute kidney failure, unspecified: Secondary | ICD-10-CM | POA: Diagnosis not present

## 2023-04-23 DIAGNOSIS — N183 Chronic kidney disease, stage 3 unspecified: Secondary | ICD-10-CM | POA: Diagnosis not present

## 2023-04-23 DIAGNOSIS — S31809A Unspecified open wound of unspecified buttock, initial encounter: Secondary | ICD-10-CM | POA: Diagnosis not present

## 2023-04-23 DIAGNOSIS — I824Y2 Acute embolism and thrombosis of unspecified deep veins of left proximal lower extremity: Secondary | ICD-10-CM | POA: Diagnosis not present

## 2023-04-23 DIAGNOSIS — G9340 Encephalopathy, unspecified: Secondary | ICD-10-CM | POA: Diagnosis not present

## 2023-04-23 DIAGNOSIS — G9341 Metabolic encephalopathy: Secondary | ICD-10-CM | POA: Diagnosis not present

## 2023-04-24 DIAGNOSIS — R410 Disorientation, unspecified: Secondary | ICD-10-CM | POA: Diagnosis not present

## 2023-04-24 DIAGNOSIS — I129 Hypertensive chronic kidney disease with stage 1 through stage 4 chronic kidney disease, or unspecified chronic kidney disease: Secondary | ICD-10-CM | POA: Diagnosis not present

## 2023-04-24 DIAGNOSIS — Z23 Encounter for immunization: Secondary | ICD-10-CM | POA: Diagnosis not present

## 2023-04-24 DIAGNOSIS — N179 Acute kidney failure, unspecified: Secondary | ICD-10-CM | POA: Diagnosis not present

## 2023-04-24 DIAGNOSIS — S31809A Unspecified open wound of unspecified buttock, initial encounter: Secondary | ICD-10-CM | POA: Diagnosis not present

## 2023-04-24 DIAGNOSIS — N183 Chronic kidney disease, stage 3 unspecified: Secondary | ICD-10-CM | POA: Diagnosis not present

## 2023-04-24 DIAGNOSIS — G9341 Metabolic encephalopathy: Secondary | ICD-10-CM | POA: Diagnosis not present

## 2023-04-24 DIAGNOSIS — G9340 Encephalopathy, unspecified: Secondary | ICD-10-CM | POA: Diagnosis not present

## 2023-04-24 DIAGNOSIS — I824Y2 Acute embolism and thrombosis of unspecified deep veins of left proximal lower extremity: Secondary | ICD-10-CM | POA: Diagnosis not present

## 2023-04-24 DIAGNOSIS — E785 Hyperlipidemia, unspecified: Secondary | ICD-10-CM | POA: Diagnosis not present

## 2023-04-24 DIAGNOSIS — D631 Anemia in chronic kidney disease: Secondary | ICD-10-CM | POA: Diagnosis not present

## 2023-04-25 DIAGNOSIS — N183 Chronic kidney disease, stage 3 unspecified: Secondary | ICD-10-CM | POA: Diagnosis not present

## 2023-04-25 DIAGNOSIS — I824Y2 Acute embolism and thrombosis of unspecified deep veins of left proximal lower extremity: Secondary | ICD-10-CM | POA: Diagnosis not present

## 2023-04-25 DIAGNOSIS — Z23 Encounter for immunization: Secondary | ICD-10-CM | POA: Diagnosis not present

## 2023-04-25 DIAGNOSIS — G9341 Metabolic encephalopathy: Secondary | ICD-10-CM | POA: Diagnosis not present

## 2023-04-25 DIAGNOSIS — E785 Hyperlipidemia, unspecified: Secondary | ICD-10-CM | POA: Diagnosis not present

## 2023-04-25 DIAGNOSIS — N179 Acute kidney failure, unspecified: Secondary | ICD-10-CM | POA: Diagnosis not present

## 2023-04-25 DIAGNOSIS — D631 Anemia in chronic kidney disease: Secondary | ICD-10-CM | POA: Diagnosis not present

## 2023-04-25 DIAGNOSIS — G9340 Encephalopathy, unspecified: Secondary | ICD-10-CM | POA: Diagnosis not present

## 2023-04-25 DIAGNOSIS — I82402 Acute embolism and thrombosis of unspecified deep veins of left lower extremity: Secondary | ICD-10-CM | POA: Diagnosis not present

## 2023-04-25 DIAGNOSIS — R410 Disorientation, unspecified: Secondary | ICD-10-CM | POA: Diagnosis not present

## 2023-04-25 DIAGNOSIS — S91302A Unspecified open wound, left foot, initial encounter: Secondary | ICD-10-CM | POA: Diagnosis not present

## 2023-04-25 DIAGNOSIS — I129 Hypertensive chronic kidney disease with stage 1 through stage 4 chronic kidney disease, or unspecified chronic kidney disease: Secondary | ICD-10-CM | POA: Diagnosis not present

## 2023-04-26 DIAGNOSIS — Z23 Encounter for immunization: Secondary | ICD-10-CM | POA: Diagnosis not present

## 2023-04-26 DIAGNOSIS — R410 Disorientation, unspecified: Secondary | ICD-10-CM | POA: Diagnosis not present

## 2023-04-26 DIAGNOSIS — N179 Acute kidney failure, unspecified: Secondary | ICD-10-CM | POA: Diagnosis not present

## 2023-04-26 DIAGNOSIS — G9340 Encephalopathy, unspecified: Secondary | ICD-10-CM | POA: Diagnosis not present

## 2023-04-26 DIAGNOSIS — I129 Hypertensive chronic kidney disease with stage 1 through stage 4 chronic kidney disease, or unspecified chronic kidney disease: Secondary | ICD-10-CM | POA: Diagnosis not present

## 2023-04-26 DIAGNOSIS — N183 Chronic kidney disease, stage 3 unspecified: Secondary | ICD-10-CM | POA: Diagnosis not present

## 2023-04-26 DIAGNOSIS — S91302A Unspecified open wound, left foot, initial encounter: Secondary | ICD-10-CM | POA: Diagnosis not present

## 2023-04-26 DIAGNOSIS — E785 Hyperlipidemia, unspecified: Secondary | ICD-10-CM | POA: Diagnosis not present

## 2023-04-26 DIAGNOSIS — G9341 Metabolic encephalopathy: Secondary | ICD-10-CM | POA: Diagnosis not present

## 2023-04-26 DIAGNOSIS — I824Y2 Acute embolism and thrombosis of unspecified deep veins of left proximal lower extremity: Secondary | ICD-10-CM | POA: Diagnosis not present

## 2023-04-26 DIAGNOSIS — D631 Anemia in chronic kidney disease: Secondary | ICD-10-CM | POA: Diagnosis not present

## 2023-04-26 DIAGNOSIS — I82402 Acute embolism and thrombosis of unspecified deep veins of left lower extremity: Secondary | ICD-10-CM | POA: Diagnosis not present

## 2023-05-03 DIAGNOSIS — M4316 Spondylolisthesis, lumbar region: Secondary | ICD-10-CM | POA: Diagnosis not present

## 2023-05-03 DIAGNOSIS — R1031 Right lower quadrant pain: Secondary | ICD-10-CM | POA: Diagnosis not present

## 2023-05-03 DIAGNOSIS — M48062 Spinal stenosis, lumbar region with neurogenic claudication: Secondary | ICD-10-CM | POA: Diagnosis not present

## 2023-05-03 DIAGNOSIS — L89322 Pressure ulcer of left buttock, stage 2: Secondary | ICD-10-CM | POA: Diagnosis not present

## 2023-05-03 DIAGNOSIS — M4802 Spinal stenosis, cervical region: Secondary | ICD-10-CM | POA: Diagnosis not present

## 2023-05-03 DIAGNOSIS — G8929 Other chronic pain: Secondary | ICD-10-CM | POA: Diagnosis not present

## 2023-05-03 DIAGNOSIS — L89620 Pressure ulcer of left heel, unstageable: Secondary | ICD-10-CM | POA: Diagnosis not present

## 2023-05-03 DIAGNOSIS — Z471 Aftercare following joint replacement surgery: Secondary | ICD-10-CM | POA: Diagnosis not present

## 2023-05-03 DIAGNOSIS — R1033 Periumbilical pain: Secondary | ICD-10-CM | POA: Diagnosis not present

## 2023-05-04 DIAGNOSIS — Z471 Aftercare following joint replacement surgery: Secondary | ICD-10-CM | POA: Diagnosis not present

## 2023-05-04 DIAGNOSIS — I82402 Acute embolism and thrombosis of unspecified deep veins of left lower extremity: Secondary | ICD-10-CM | POA: Diagnosis not present

## 2023-05-04 DIAGNOSIS — E7849 Other hyperlipidemia: Secondary | ICD-10-CM | POA: Diagnosis not present

## 2023-05-04 DIAGNOSIS — E1140 Type 2 diabetes mellitus with diabetic neuropathy, unspecified: Secondary | ICD-10-CM | POA: Diagnosis not present

## 2023-05-04 DIAGNOSIS — M48062 Spinal stenosis, lumbar region with neurogenic claudication: Secondary | ICD-10-CM | POA: Diagnosis not present

## 2023-05-04 DIAGNOSIS — G9341 Metabolic encephalopathy: Secondary | ICD-10-CM | POA: Diagnosis not present

## 2023-05-04 DIAGNOSIS — M4802 Spinal stenosis, cervical region: Secondary | ICD-10-CM | POA: Diagnosis not present

## 2023-05-04 DIAGNOSIS — M1712 Unilateral primary osteoarthritis, left knee: Secondary | ICD-10-CM | POA: Diagnosis not present

## 2023-05-04 DIAGNOSIS — L89620 Pressure ulcer of left heel, unstageable: Secondary | ICD-10-CM | POA: Diagnosis not present

## 2023-05-04 DIAGNOSIS — M4316 Spondylolisthesis, lumbar region: Secondary | ICD-10-CM | POA: Diagnosis not present

## 2023-05-04 DIAGNOSIS — G8929 Other chronic pain: Secondary | ICD-10-CM | POA: Diagnosis not present

## 2023-05-04 DIAGNOSIS — L89322 Pressure ulcer of left buttock, stage 2: Secondary | ICD-10-CM | POA: Diagnosis not present

## 2023-05-04 DIAGNOSIS — L89302 Pressure ulcer of unspecified buttock, stage 2: Secondary | ICD-10-CM | POA: Diagnosis not present

## 2023-05-04 DIAGNOSIS — N1831 Chronic kidney disease, stage 3a: Secondary | ICD-10-CM | POA: Diagnosis not present

## 2023-05-04 DIAGNOSIS — R1033 Periumbilical pain: Secondary | ICD-10-CM | POA: Diagnosis not present

## 2023-05-04 DIAGNOSIS — R1031 Right lower quadrant pain: Secondary | ICD-10-CM | POA: Diagnosis not present

## 2023-05-08 DIAGNOSIS — M4316 Spondylolisthesis, lumbar region: Secondary | ICD-10-CM | POA: Diagnosis not present

## 2023-05-08 DIAGNOSIS — L89322 Pressure ulcer of left buttock, stage 2: Secondary | ICD-10-CM | POA: Diagnosis not present

## 2023-05-08 DIAGNOSIS — M48062 Spinal stenosis, lumbar region with neurogenic claudication: Secondary | ICD-10-CM | POA: Diagnosis not present

## 2023-05-08 DIAGNOSIS — M4802 Spinal stenosis, cervical region: Secondary | ICD-10-CM | POA: Diagnosis not present

## 2023-05-08 DIAGNOSIS — L89620 Pressure ulcer of left heel, unstageable: Secondary | ICD-10-CM | POA: Diagnosis not present

## 2023-05-08 DIAGNOSIS — R1033 Periumbilical pain: Secondary | ICD-10-CM | POA: Diagnosis not present

## 2023-05-08 DIAGNOSIS — Z471 Aftercare following joint replacement surgery: Secondary | ICD-10-CM | POA: Diagnosis not present

## 2023-05-08 DIAGNOSIS — G8929 Other chronic pain: Secondary | ICD-10-CM | POA: Diagnosis not present

## 2023-05-08 DIAGNOSIS — R1031 Right lower quadrant pain: Secondary | ICD-10-CM | POA: Diagnosis not present

## 2023-05-09 DIAGNOSIS — L89322 Pressure ulcer of left buttock, stage 2: Secondary | ICD-10-CM | POA: Diagnosis not present

## 2023-05-09 DIAGNOSIS — M48062 Spinal stenosis, lumbar region with neurogenic claudication: Secondary | ICD-10-CM | POA: Diagnosis not present

## 2023-05-09 DIAGNOSIS — G8929 Other chronic pain: Secondary | ICD-10-CM | POA: Diagnosis not present

## 2023-05-09 DIAGNOSIS — Z471 Aftercare following joint replacement surgery: Secondary | ICD-10-CM | POA: Diagnosis not present

## 2023-05-09 DIAGNOSIS — R1033 Periumbilical pain: Secondary | ICD-10-CM | POA: Diagnosis not present

## 2023-05-09 DIAGNOSIS — M4802 Spinal stenosis, cervical region: Secondary | ICD-10-CM | POA: Diagnosis not present

## 2023-05-09 DIAGNOSIS — L89620 Pressure ulcer of left heel, unstageable: Secondary | ICD-10-CM | POA: Diagnosis not present

## 2023-05-09 DIAGNOSIS — M4316 Spondylolisthesis, lumbar region: Secondary | ICD-10-CM | POA: Diagnosis not present

## 2023-05-09 DIAGNOSIS — R1031 Right lower quadrant pain: Secondary | ICD-10-CM | POA: Diagnosis not present

## 2023-05-10 ENCOUNTER — Other Ambulatory Visit (INDEPENDENT_AMBULATORY_CARE_PROVIDER_SITE_OTHER): Payer: Medicare HMO

## 2023-05-10 ENCOUNTER — Encounter: Payer: Self-pay | Admitting: Physician Assistant

## 2023-05-10 ENCOUNTER — Ambulatory Visit: Payer: Medicare HMO | Admitting: Physician Assistant

## 2023-05-10 DIAGNOSIS — Z96652 Presence of left artificial knee joint: Secondary | ICD-10-CM

## 2023-05-10 NOTE — Progress Notes (Signed)
HPI: [name] [name] returns today for follow-up of her left total knee arthroplasty.  She unfortunately had a fall.  She was admitted to the hospital on 04/17/2023 after being brought to the ER due to confusion and repeating speech.  She was found by her son on the floor at her home.  She is found to have acute encephalopathy, acute kidney injury with elevated troponin and mildly elevated CPK.  There was some thought that she may have taken baclofen which 2 the prescribed by our office.  We reviewed records and in fact she was never prescribed baclofen for our our office.  She was prescribed baclofen from another provider here in town.  She also was on a high dose of gabapentin and this has been reduced.  She still dealing with the heel wound.  She is receiving home health PT and wound care.  She is using a walker to ambulate.  Physical exam: General Well-developed well-nourished female no acute distress mood and affect appropriate. Respirations: Unlabored Left knee: Surgical incisions well-healed.  Full extension flexion at 90 to 95 degrees.  Dorsiflexion plantarflexion ankle intact.  No ligamentous instability.  Impression: Status post left total knee arthroplasty 02/13/2023 Left lower extremity DVT on Eliquis  Plan: Recommend she start outpatient physical therapy.  Ilda Mori, RN will set this up for her.  This is to work on range of motion strengthening left leg.  She will continue her Eliquis.  In regards to her heel wound on the left we will send her to outpatient wound care.  Questions were encouraged and answered at length today.  Patient and her son who was present.  Follow-up with [place] in 1 month to see what type of progress she has made in regards to her knee range of motion.

## 2023-05-11 ENCOUNTER — Telehealth: Payer: Self-pay | Admitting: *Deleted

## 2023-05-11 DIAGNOSIS — R1031 Right lower quadrant pain: Secondary | ICD-10-CM | POA: Diagnosis not present

## 2023-05-11 DIAGNOSIS — L89620 Pressure ulcer of left heel, unstageable: Secondary | ICD-10-CM | POA: Diagnosis not present

## 2023-05-11 DIAGNOSIS — M4802 Spinal stenosis, cervical region: Secondary | ICD-10-CM | POA: Diagnosis not present

## 2023-05-11 DIAGNOSIS — L89322 Pressure ulcer of left buttock, stage 2: Secondary | ICD-10-CM | POA: Diagnosis not present

## 2023-05-11 DIAGNOSIS — R1033 Periumbilical pain: Secondary | ICD-10-CM | POA: Diagnosis not present

## 2023-05-11 DIAGNOSIS — M4316 Spondylolisthesis, lumbar region: Secondary | ICD-10-CM | POA: Diagnosis not present

## 2023-05-11 DIAGNOSIS — G8929 Other chronic pain: Secondary | ICD-10-CM | POA: Diagnosis not present

## 2023-05-11 DIAGNOSIS — Z471 Aftercare following joint replacement surgery: Secondary | ICD-10-CM | POA: Diagnosis not present

## 2023-05-11 DIAGNOSIS — M48062 Spinal stenosis, lumbar region with neurogenic claudication: Secondary | ICD-10-CM | POA: Diagnosis not present

## 2023-05-11 NOTE — Telephone Encounter (Signed)
(  Late entries for 02/19/23 and 02/20/23). Patient attempted calls and spoke with son for d/c call and 7 day call.

## 2023-05-11 NOTE — Telephone Encounter (Signed)
Met with patient in office today. She is 1 week shy of 90 days s/p L-TKA. She was seen in our office last on 04/12/23 by PA for Dr. Magnus Ivan to evaluate knee status as well as a pressure wound on the heel she acquired while in SNF after discharge from hospital. She was next on list for [facility] wound clinic per their staff and would be having an appt scheduled with them soon. Also a referral was made to [facility] to begin OPPT. Unfortunately she was admitted to hospital a few days later on 10/21 or 04/17/23. She was having stroke like symptoms and was found at home where she had fallen by her son. He mentions her pill box was empty for 2 days almost as if she had taken mistakenly 2 days worth of medications at once. He mentions as well that the hospital discovered the medications she was taking prescribed by our office had given her these symptoms. CM and PA both confirmed that no medication has recently been prescribed by this office. Call to [facility] confirmed that Baclofen as well as Trazadone were both picked up by the family around 10/16-10/20/24 that were prescribed by 2 different providers in our area. She also was taking Gabapentin 800 mg tid. Again, no medications have been prescribed by this office since after surgery over 2 months ago. Patient states she is back home and feels normal now. She is receiving HHPT as well as Skilled nursing for her wound care. CM, patient, family all discussed getting her into OPPT ASAP to help with ROM at this point. She would have to transition over to OP wound center as well instead of HH nursing. She was agreeable. Will assist with getting these appointments rescheduled. Updated MD and PA.

## 2023-05-15 DIAGNOSIS — R1033 Periumbilical pain: Secondary | ICD-10-CM | POA: Diagnosis not present

## 2023-05-15 DIAGNOSIS — M4802 Spinal stenosis, cervical region: Secondary | ICD-10-CM | POA: Diagnosis not present

## 2023-05-15 DIAGNOSIS — L89322 Pressure ulcer of left buttock, stage 2: Secondary | ICD-10-CM | POA: Diagnosis not present

## 2023-05-15 DIAGNOSIS — M4316 Spondylolisthesis, lumbar region: Secondary | ICD-10-CM | POA: Diagnosis not present

## 2023-05-15 DIAGNOSIS — M48062 Spinal stenosis, lumbar region with neurogenic claudication: Secondary | ICD-10-CM | POA: Diagnosis not present

## 2023-05-15 DIAGNOSIS — L89620 Pressure ulcer of left heel, unstageable: Secondary | ICD-10-CM | POA: Diagnosis not present

## 2023-05-15 DIAGNOSIS — Z471 Aftercare following joint replacement surgery: Secondary | ICD-10-CM | POA: Diagnosis not present

## 2023-05-15 DIAGNOSIS — R1031 Right lower quadrant pain: Secondary | ICD-10-CM | POA: Diagnosis not present

## 2023-05-15 DIAGNOSIS — G8929 Other chronic pain: Secondary | ICD-10-CM | POA: Diagnosis not present

## 2023-05-26 DIAGNOSIS — M1712 Unilateral primary osteoarthritis, left knee: Secondary | ICD-10-CM | POA: Diagnosis not present

## 2023-05-26 DIAGNOSIS — G9341 Metabolic encephalopathy: Secondary | ICD-10-CM | POA: Diagnosis not present

## 2023-05-26 DIAGNOSIS — N189 Chronic kidney disease, unspecified: Secondary | ICD-10-CM | POA: Diagnosis not present

## 2023-05-26 DIAGNOSIS — L89302 Pressure ulcer of unspecified buttock, stage 2: Secondary | ICD-10-CM | POA: Diagnosis not present

## 2023-05-26 DIAGNOSIS — L89620 Pressure ulcer of left heel, unstageable: Secondary | ICD-10-CM | POA: Diagnosis not present

## 2023-05-26 DIAGNOSIS — E7849 Other hyperlipidemia: Secondary | ICD-10-CM | POA: Diagnosis not present

## 2023-05-26 DIAGNOSIS — I82402 Acute embolism and thrombosis of unspecified deep veins of left lower extremity: Secondary | ICD-10-CM | POA: Diagnosis not present

## 2023-05-26 DIAGNOSIS — E1140 Type 2 diabetes mellitus with diabetic neuropathy, unspecified: Secondary | ICD-10-CM | POA: Diagnosis not present

## 2023-05-26 DIAGNOSIS — Z96652 Presence of left artificial knee joint: Secondary | ICD-10-CM | POA: Diagnosis not present

## 2023-05-28 DIAGNOSIS — M25562 Pain in left knee: Secondary | ICD-10-CM | POA: Diagnosis not present

## 2023-06-01 DIAGNOSIS — E1140 Type 2 diabetes mellitus with diabetic neuropathy, unspecified: Secondary | ICD-10-CM | POA: Diagnosis not present

## 2023-06-01 DIAGNOSIS — I10 Essential (primary) hypertension: Secondary | ICD-10-CM | POA: Diagnosis not present

## 2023-06-01 DIAGNOSIS — N1831 Chronic kidney disease, stage 3a: Secondary | ICD-10-CM | POA: Diagnosis not present

## 2023-06-01 DIAGNOSIS — E7849 Other hyperlipidemia: Secondary | ICD-10-CM | POA: Diagnosis not present

## 2023-06-04 DIAGNOSIS — M25562 Pain in left knee: Secondary | ICD-10-CM | POA: Diagnosis not present

## 2023-06-06 DIAGNOSIS — E1122 Type 2 diabetes mellitus with diabetic chronic kidney disease: Secondary | ICD-10-CM | POA: Diagnosis not present

## 2023-06-06 DIAGNOSIS — Z6832 Body mass index (BMI) 32.0-32.9, adult: Secondary | ICD-10-CM | POA: Diagnosis not present

## 2023-06-06 DIAGNOSIS — Z96652 Presence of left artificial knee joint: Secondary | ICD-10-CM | POA: Diagnosis not present

## 2023-06-06 DIAGNOSIS — N1832 Chronic kidney disease, stage 3b: Secondary | ICD-10-CM | POA: Diagnosis not present

## 2023-06-06 DIAGNOSIS — R030 Elevated blood-pressure reading, without diagnosis of hypertension: Secondary | ICD-10-CM | POA: Diagnosis not present

## 2023-06-06 DIAGNOSIS — L89623 Pressure ulcer of left heel, stage 3: Secondary | ICD-10-CM | POA: Diagnosis not present

## 2023-06-06 DIAGNOSIS — L89620 Pressure ulcer of left heel, unstageable: Secondary | ICD-10-CM | POA: Diagnosis not present

## 2023-06-06 DIAGNOSIS — M1712 Unilateral primary osteoarthritis, left knee: Secondary | ICD-10-CM | POA: Diagnosis not present

## 2023-06-06 DIAGNOSIS — E11621 Type 2 diabetes mellitus with foot ulcer: Secondary | ICD-10-CM | POA: Diagnosis not present

## 2023-06-06 DIAGNOSIS — I129 Hypertensive chronic kidney disease with stage 1 through stage 4 chronic kidney disease, or unspecified chronic kidney disease: Secondary | ICD-10-CM | POA: Diagnosis not present

## 2023-06-06 DIAGNOSIS — Z90710 Acquired absence of both cervix and uterus: Secondary | ICD-10-CM | POA: Diagnosis not present

## 2023-06-06 DIAGNOSIS — E7849 Other hyperlipidemia: Secondary | ICD-10-CM | POA: Diagnosis not present

## 2023-06-06 DIAGNOSIS — E785 Hyperlipidemia, unspecified: Secondary | ICD-10-CM | POA: Diagnosis not present

## 2023-06-06 DIAGNOSIS — E1140 Type 2 diabetes mellitus with diabetic neuropathy, unspecified: Secondary | ICD-10-CM | POA: Diagnosis not present

## 2023-06-06 DIAGNOSIS — Z7901 Long term (current) use of anticoagulants: Secondary | ICD-10-CM | POA: Diagnosis not present

## 2023-06-06 DIAGNOSIS — I82402 Acute embolism and thrombosis of unspecified deep veins of left lower extremity: Secondary | ICD-10-CM | POA: Diagnosis not present

## 2023-06-06 DIAGNOSIS — I824Y2 Acute embolism and thrombosis of unspecified deep veins of left proximal lower extremity: Secondary | ICD-10-CM | POA: Diagnosis not present

## 2023-06-07 ENCOUNTER — Encounter: Payer: Self-pay | Admitting: Physician Assistant

## 2023-06-07 ENCOUNTER — Ambulatory Visit: Payer: Medicare HMO | Admitting: Physician Assistant

## 2023-06-07 DIAGNOSIS — Z96652 Presence of left artificial knee joint: Secondary | ICD-10-CM

## 2023-06-07 DIAGNOSIS — M25562 Pain in left knee: Secondary | ICD-10-CM | POA: Diagnosis not present

## 2023-06-07 NOTE — Progress Notes (Signed)
HPI: [name] [name] returns today status post left total knee arthroplasty 02/13/2023.  She is overall doing well.  Denies any fevers or chills.  She is being seen by the rehab.  Clinic for her left heel ulcer.  She states overall that this is improving.  She is on Eliquis which was started on 03/19/2023.  Unable to find actual Dopplers stating that she had DVT with ER notes state that she has a left lower extremity DVT. She is in outpatient therapy working on range of motion and strengthening.  States that her knee pain at worst is 3-4 out of 10.  She is only taking Tylenol for pain.  Review of systems: Negative for fevers chills.  Please see HPI otherwise negative or noncontributory  Physical exam: General Well-developed well-nourished female no acute distress. Mood and affect appropriate. Left knee: Full extension flexion to approximately 110 degrees.  No instability valgus varus stressing.  Surgical incisions healed well.  Dorsiflexion plantarflexion left ankle intact.  Ambulating with a walker.   Impression: Status post left total knee arthroplasty now 15 weeks postop   Plan: Will repeat her Doppler on or about March 23, to evaluate for DVT left lower extremity.  Will see her back with Dr. Magnus Ivan in 3 months.  Questions were encouraged and answered at length.

## 2023-06-11 DIAGNOSIS — M25562 Pain in left knee: Secondary | ICD-10-CM | POA: Diagnosis not present

## 2023-06-13 DIAGNOSIS — L89623 Pressure ulcer of left heel, stage 3: Secondary | ICD-10-CM | POA: Diagnosis not present

## 2023-06-13 DIAGNOSIS — E1122 Type 2 diabetes mellitus with diabetic chronic kidney disease: Secondary | ICD-10-CM | POA: Diagnosis not present

## 2023-06-13 DIAGNOSIS — Z90710 Acquired absence of both cervix and uterus: Secondary | ICD-10-CM | POA: Diagnosis not present

## 2023-06-13 DIAGNOSIS — E785 Hyperlipidemia, unspecified: Secondary | ICD-10-CM | POA: Diagnosis not present

## 2023-06-13 DIAGNOSIS — E11621 Type 2 diabetes mellitus with foot ulcer: Secondary | ICD-10-CM | POA: Diagnosis not present

## 2023-06-13 DIAGNOSIS — I824Y2 Acute embolism and thrombosis of unspecified deep veins of left proximal lower extremity: Secondary | ICD-10-CM | POA: Diagnosis not present

## 2023-06-13 DIAGNOSIS — Z7901 Long term (current) use of anticoagulants: Secondary | ICD-10-CM | POA: Diagnosis not present

## 2023-06-13 DIAGNOSIS — I129 Hypertensive chronic kidney disease with stage 1 through stage 4 chronic kidney disease, or unspecified chronic kidney disease: Secondary | ICD-10-CM | POA: Diagnosis not present

## 2023-06-13 DIAGNOSIS — E1140 Type 2 diabetes mellitus with diabetic neuropathy, unspecified: Secondary | ICD-10-CM | POA: Diagnosis not present

## 2023-06-14 DIAGNOSIS — M25562 Pain in left knee: Secondary | ICD-10-CM | POA: Diagnosis not present

## 2023-06-18 DIAGNOSIS — M25562 Pain in left knee: Secondary | ICD-10-CM | POA: Diagnosis not present

## 2023-06-21 ENCOUNTER — Telehealth: Payer: Self-pay

## 2023-06-21 ENCOUNTER — Ambulatory Visit (HOSPITAL_COMMUNITY)
Admission: RE | Admit: 2023-06-21 | Discharge: 2023-06-21 | Disposition: A | Discharge status: Home / Self Care | Payer: Medicare HMO | Source: Ambulatory Visit | Attending: Physician Assistant | Admitting: Physician Assistant

## 2023-06-21 DIAGNOSIS — Z96652 Presence of left artificial knee joint: Secondary | ICD-10-CM | POA: Diagnosis not present

## 2023-06-21 DIAGNOSIS — E1140 Type 2 diabetes mellitus with diabetic neuropathy, unspecified: Secondary | ICD-10-CM | POA: Diagnosis not present

## 2023-06-21 DIAGNOSIS — L89620 Pressure ulcer of left heel, unstageable: Secondary | ICD-10-CM | POA: Diagnosis not present

## 2023-06-21 DIAGNOSIS — I824Y2 Acute embolism and thrombosis of unspecified deep veins of left proximal lower extremity: Secondary | ICD-10-CM | POA: Diagnosis not present

## 2023-06-21 DIAGNOSIS — Z7901 Long term (current) use of anticoagulants: Secondary | ICD-10-CM | POA: Diagnosis not present

## 2023-06-21 DIAGNOSIS — L89623 Pressure ulcer of left heel, stage 3: Secondary | ICD-10-CM | POA: Diagnosis not present

## 2023-06-21 DIAGNOSIS — E11621 Type 2 diabetes mellitus with foot ulcer: Secondary | ICD-10-CM | POA: Diagnosis not present

## 2023-06-21 DIAGNOSIS — Z90710 Acquired absence of both cervix and uterus: Secondary | ICD-10-CM | POA: Diagnosis not present

## 2023-06-21 DIAGNOSIS — E785 Hyperlipidemia, unspecified: Secondary | ICD-10-CM | POA: Diagnosis not present

## 2023-06-21 DIAGNOSIS — E1122 Type 2 diabetes mellitus with diabetic chronic kidney disease: Secondary | ICD-10-CM | POA: Diagnosis not present

## 2023-06-21 DIAGNOSIS — I129 Hypertensive chronic kidney disease with stage 1 through stage 4 chronic kidney disease, or unspecified chronic kidney disease: Secondary | ICD-10-CM | POA: Diagnosis not present

## 2023-06-21 NOTE — Telephone Encounter (Signed)
Vascular and vein calling about this pt of Cb had U/S to r/o DVT today and it is POSITIVE.  Femoral vein proximal to mid thigh. Pt is on Eliquis this was started on 03/19/2023. . Advised will send message to doctor on call and mark urgent and to advise pt that they will call with instructions.

## 2023-06-22 DIAGNOSIS — M25562 Pain in left knee: Secondary | ICD-10-CM | POA: Diagnosis not present

## 2023-06-25 DIAGNOSIS — M25562 Pain in left knee: Secondary | ICD-10-CM | POA: Diagnosis not present

## 2023-06-26 DIAGNOSIS — I129 Hypertensive chronic kidney disease with stage 1 through stage 4 chronic kidney disease, or unspecified chronic kidney disease: Secondary | ICD-10-CM | POA: Diagnosis not present

## 2023-06-26 DIAGNOSIS — Z90710 Acquired absence of both cervix and uterus: Secondary | ICD-10-CM | POA: Diagnosis not present

## 2023-06-26 DIAGNOSIS — E1140 Type 2 diabetes mellitus with diabetic neuropathy, unspecified: Secondary | ICD-10-CM | POA: Diagnosis not present

## 2023-06-26 DIAGNOSIS — E11621 Type 2 diabetes mellitus with foot ulcer: Secondary | ICD-10-CM | POA: Diagnosis not present

## 2023-06-26 DIAGNOSIS — I824Y2 Acute embolism and thrombosis of unspecified deep veins of left proximal lower extremity: Secondary | ICD-10-CM | POA: Diagnosis not present

## 2023-06-26 DIAGNOSIS — E785 Hyperlipidemia, unspecified: Secondary | ICD-10-CM | POA: Diagnosis not present

## 2023-06-26 DIAGNOSIS — Z7901 Long term (current) use of anticoagulants: Secondary | ICD-10-CM | POA: Diagnosis not present

## 2023-06-26 DIAGNOSIS — L89623 Pressure ulcer of left heel, stage 3: Secondary | ICD-10-CM | POA: Diagnosis not present

## 2023-06-26 DIAGNOSIS — E1122 Type 2 diabetes mellitus with diabetic chronic kidney disease: Secondary | ICD-10-CM | POA: Diagnosis not present

## 2023-06-28 DIAGNOSIS — M25562 Pain in left knee: Secondary | ICD-10-CM | POA: Diagnosis not present

## 2023-06-29 ENCOUNTER — Other Ambulatory Visit: Payer: Self-pay

## 2023-06-29 DIAGNOSIS — Z96652 Presence of left artificial knee joint: Secondary | ICD-10-CM

## 2023-07-04 DIAGNOSIS — I824Y2 Acute embolism and thrombosis of unspecified deep veins of left proximal lower extremity: Secondary | ICD-10-CM | POA: Diagnosis not present

## 2023-07-04 DIAGNOSIS — Z90710 Acquired absence of both cervix and uterus: Secondary | ICD-10-CM | POA: Diagnosis not present

## 2023-07-04 DIAGNOSIS — E1140 Type 2 diabetes mellitus with diabetic neuropathy, unspecified: Secondary | ICD-10-CM | POA: Diagnosis not present

## 2023-07-04 DIAGNOSIS — Z7901 Long term (current) use of anticoagulants: Secondary | ICD-10-CM | POA: Diagnosis not present

## 2023-07-04 DIAGNOSIS — E11621 Type 2 diabetes mellitus with foot ulcer: Secondary | ICD-10-CM | POA: Diagnosis not present

## 2023-07-04 DIAGNOSIS — E785 Hyperlipidemia, unspecified: Secondary | ICD-10-CM | POA: Diagnosis not present

## 2023-07-04 DIAGNOSIS — L89623 Pressure ulcer of left heel, stage 3: Secondary | ICD-10-CM | POA: Diagnosis not present

## 2023-07-04 DIAGNOSIS — I129 Hypertensive chronic kidney disease with stage 1 through stage 4 chronic kidney disease, or unspecified chronic kidney disease: Secondary | ICD-10-CM | POA: Diagnosis not present

## 2023-07-04 DIAGNOSIS — E1122 Type 2 diabetes mellitus with diabetic chronic kidney disease: Secondary | ICD-10-CM | POA: Diagnosis not present

## 2023-07-05 DIAGNOSIS — M25562 Pain in left knee: Secondary | ICD-10-CM | POA: Diagnosis not present

## 2023-07-09 DIAGNOSIS — M25562 Pain in left knee: Secondary | ICD-10-CM | POA: Diagnosis not present

## 2023-07-11 DIAGNOSIS — I129 Hypertensive chronic kidney disease with stage 1 through stage 4 chronic kidney disease, or unspecified chronic kidney disease: Secondary | ICD-10-CM | POA: Diagnosis not present

## 2023-07-11 DIAGNOSIS — I824Y2 Acute embolism and thrombosis of unspecified deep veins of left proximal lower extremity: Secondary | ICD-10-CM | POA: Diagnosis not present

## 2023-07-11 DIAGNOSIS — E785 Hyperlipidemia, unspecified: Secondary | ICD-10-CM | POA: Diagnosis not present

## 2023-07-11 DIAGNOSIS — I2510 Atherosclerotic heart disease of native coronary artery without angina pectoris: Secondary | ICD-10-CM | POA: Diagnosis not present

## 2023-07-11 DIAGNOSIS — E11621 Type 2 diabetes mellitus with foot ulcer: Secondary | ICD-10-CM | POA: Diagnosis not present

## 2023-07-11 DIAGNOSIS — E1140 Type 2 diabetes mellitus with diabetic neuropathy, unspecified: Secondary | ICD-10-CM | POA: Diagnosis not present

## 2023-07-11 DIAGNOSIS — E1122 Type 2 diabetes mellitus with diabetic chronic kidney disease: Secondary | ICD-10-CM | POA: Diagnosis not present

## 2023-07-11 DIAGNOSIS — L89623 Pressure ulcer of left heel, stage 3: Secondary | ICD-10-CM | POA: Diagnosis not present

## 2023-07-11 DIAGNOSIS — N182 Chronic kidney disease, stage 2 (mild): Secondary | ICD-10-CM | POA: Diagnosis not present

## 2023-07-12 DIAGNOSIS — M25562 Pain in left knee: Secondary | ICD-10-CM | POA: Diagnosis not present

## 2023-07-13 DIAGNOSIS — Z7901 Long term (current) use of anticoagulants: Secondary | ICD-10-CM | POA: Diagnosis not present

## 2023-07-16 DIAGNOSIS — M25562 Pain in left knee: Secondary | ICD-10-CM | POA: Diagnosis not present

## 2023-07-16 DIAGNOSIS — Z0001 Encounter for general adult medical examination with abnormal findings: Secondary | ICD-10-CM | POA: Diagnosis not present

## 2023-07-18 DIAGNOSIS — I2510 Atherosclerotic heart disease of native coronary artery without angina pectoris: Secondary | ICD-10-CM | POA: Diagnosis not present

## 2023-07-18 DIAGNOSIS — E1140 Type 2 diabetes mellitus with diabetic neuropathy, unspecified: Secondary | ICD-10-CM | POA: Diagnosis not present

## 2023-07-18 DIAGNOSIS — E785 Hyperlipidemia, unspecified: Secondary | ICD-10-CM | POA: Diagnosis not present

## 2023-07-18 DIAGNOSIS — E1122 Type 2 diabetes mellitus with diabetic chronic kidney disease: Secondary | ICD-10-CM | POA: Diagnosis not present

## 2023-07-18 DIAGNOSIS — I129 Hypertensive chronic kidney disease with stage 1 through stage 4 chronic kidney disease, or unspecified chronic kidney disease: Secondary | ICD-10-CM | POA: Diagnosis not present

## 2023-07-18 DIAGNOSIS — N182 Chronic kidney disease, stage 2 (mild): Secondary | ICD-10-CM | POA: Diagnosis not present

## 2023-07-18 DIAGNOSIS — E11621 Type 2 diabetes mellitus with foot ulcer: Secondary | ICD-10-CM | POA: Diagnosis not present

## 2023-07-18 DIAGNOSIS — Z96652 Presence of left artificial knee joint: Secondary | ICD-10-CM | POA: Diagnosis not present

## 2023-07-18 DIAGNOSIS — L89623 Pressure ulcer of left heel, stage 3: Secondary | ICD-10-CM | POA: Diagnosis not present

## 2023-07-18 DIAGNOSIS — I824Y2 Acute embolism and thrombosis of unspecified deep veins of left proximal lower extremity: Secondary | ICD-10-CM | POA: Diagnosis not present

## 2023-07-20 DIAGNOSIS — Z7901 Long term (current) use of anticoagulants: Secondary | ICD-10-CM | POA: Diagnosis not present

## 2023-07-23 DIAGNOSIS — E11621 Type 2 diabetes mellitus with foot ulcer: Secondary | ICD-10-CM | POA: Diagnosis not present

## 2023-07-23 DIAGNOSIS — E1140 Type 2 diabetes mellitus with diabetic neuropathy, unspecified: Secondary | ICD-10-CM | POA: Diagnosis not present

## 2023-07-23 DIAGNOSIS — I129 Hypertensive chronic kidney disease with stage 1 through stage 4 chronic kidney disease, or unspecified chronic kidney disease: Secondary | ICD-10-CM | POA: Diagnosis not present

## 2023-07-23 DIAGNOSIS — N182 Chronic kidney disease, stage 2 (mild): Secondary | ICD-10-CM | POA: Diagnosis not present

## 2023-07-23 DIAGNOSIS — I824Y2 Acute embolism and thrombosis of unspecified deep veins of left proximal lower extremity: Secondary | ICD-10-CM | POA: Diagnosis not present

## 2023-07-23 DIAGNOSIS — L89623 Pressure ulcer of left heel, stage 3: Secondary | ICD-10-CM | POA: Diagnosis not present

## 2023-07-23 DIAGNOSIS — E785 Hyperlipidemia, unspecified: Secondary | ICD-10-CM | POA: Diagnosis not present

## 2023-07-23 DIAGNOSIS — I2510 Atherosclerotic heart disease of native coronary artery without angina pectoris: Secondary | ICD-10-CM | POA: Diagnosis not present

## 2023-07-23 DIAGNOSIS — E1122 Type 2 diabetes mellitus with diabetic chronic kidney disease: Secondary | ICD-10-CM | POA: Diagnosis not present

## 2023-07-27 DIAGNOSIS — L89623 Pressure ulcer of left heel, stage 3: Secondary | ICD-10-CM | POA: Diagnosis not present

## 2023-07-27 DIAGNOSIS — I824Y2 Acute embolism and thrombosis of unspecified deep veins of left proximal lower extremity: Secondary | ICD-10-CM | POA: Diagnosis not present

## 2023-07-27 DIAGNOSIS — M1990 Unspecified osteoarthritis, unspecified site: Secondary | ICD-10-CM | POA: Diagnosis not present

## 2023-07-27 DIAGNOSIS — D631 Anemia in chronic kidney disease: Secondary | ICD-10-CM | POA: Diagnosis not present

## 2023-07-27 DIAGNOSIS — E1122 Type 2 diabetes mellitus with diabetic chronic kidney disease: Secondary | ICD-10-CM | POA: Diagnosis not present

## 2023-07-27 DIAGNOSIS — M5030 Other cervical disc degeneration, unspecified cervical region: Secondary | ICD-10-CM | POA: Diagnosis not present

## 2023-07-27 DIAGNOSIS — E1140 Type 2 diabetes mellitus with diabetic neuropathy, unspecified: Secondary | ICD-10-CM | POA: Diagnosis not present

## 2023-07-27 DIAGNOSIS — I129 Hypertensive chronic kidney disease with stage 1 through stage 4 chronic kidney disease, or unspecified chronic kidney disease: Secondary | ICD-10-CM | POA: Diagnosis not present

## 2023-07-27 DIAGNOSIS — N183 Chronic kidney disease, stage 3 unspecified: Secondary | ICD-10-CM | POA: Diagnosis not present

## 2023-08-01 DIAGNOSIS — I129 Hypertensive chronic kidney disease with stage 1 through stage 4 chronic kidney disease, or unspecified chronic kidney disease: Secondary | ICD-10-CM | POA: Diagnosis not present

## 2023-08-01 DIAGNOSIS — I2510 Atherosclerotic heart disease of native coronary artery without angina pectoris: Secondary | ICD-10-CM | POA: Diagnosis not present

## 2023-08-01 DIAGNOSIS — L89623 Pressure ulcer of left heel, stage 3: Secondary | ICD-10-CM | POA: Diagnosis not present

## 2023-08-01 DIAGNOSIS — E1140 Type 2 diabetes mellitus with diabetic neuropathy, unspecified: Secondary | ICD-10-CM | POA: Diagnosis not present

## 2023-08-01 DIAGNOSIS — I824Y2 Acute embolism and thrombosis of unspecified deep veins of left proximal lower extremity: Secondary | ICD-10-CM | POA: Diagnosis not present

## 2023-08-01 DIAGNOSIS — E785 Hyperlipidemia, unspecified: Secondary | ICD-10-CM | POA: Diagnosis not present

## 2023-08-01 DIAGNOSIS — E1122 Type 2 diabetes mellitus with diabetic chronic kidney disease: Secondary | ICD-10-CM | POA: Diagnosis not present

## 2023-08-01 DIAGNOSIS — E11621 Type 2 diabetes mellitus with foot ulcer: Secondary | ICD-10-CM | POA: Diagnosis not present

## 2023-08-01 DIAGNOSIS — N182 Chronic kidney disease, stage 2 (mild): Secondary | ICD-10-CM | POA: Diagnosis not present

## 2023-08-03 DIAGNOSIS — I129 Hypertensive chronic kidney disease with stage 1 through stage 4 chronic kidney disease, or unspecified chronic kidney disease: Secondary | ICD-10-CM | POA: Diagnosis not present

## 2023-08-03 DIAGNOSIS — L89623 Pressure ulcer of left heel, stage 3: Secondary | ICD-10-CM | POA: Diagnosis not present

## 2023-08-03 DIAGNOSIS — I824Y2 Acute embolism and thrombosis of unspecified deep veins of left proximal lower extremity: Secondary | ICD-10-CM | POA: Diagnosis not present

## 2023-08-03 DIAGNOSIS — M1990 Unspecified osteoarthritis, unspecified site: Secondary | ICD-10-CM | POA: Diagnosis not present

## 2023-08-03 DIAGNOSIS — E1122 Type 2 diabetes mellitus with diabetic chronic kidney disease: Secondary | ICD-10-CM | POA: Diagnosis not present

## 2023-08-03 DIAGNOSIS — D631 Anemia in chronic kidney disease: Secondary | ICD-10-CM | POA: Diagnosis not present

## 2023-08-03 DIAGNOSIS — M5030 Other cervical disc degeneration, unspecified cervical region: Secondary | ICD-10-CM | POA: Diagnosis not present

## 2023-08-03 DIAGNOSIS — E1140 Type 2 diabetes mellitus with diabetic neuropathy, unspecified: Secondary | ICD-10-CM | POA: Diagnosis not present

## 2023-08-03 DIAGNOSIS — N183 Chronic kidney disease, stage 3 unspecified: Secondary | ICD-10-CM | POA: Diagnosis not present

## 2023-08-03 DIAGNOSIS — Z7901 Long term (current) use of anticoagulants: Secondary | ICD-10-CM | POA: Diagnosis not present

## 2023-08-06 DIAGNOSIS — E1122 Type 2 diabetes mellitus with diabetic chronic kidney disease: Secondary | ICD-10-CM | POA: Diagnosis not present

## 2023-08-06 DIAGNOSIS — I824Y2 Acute embolism and thrombosis of unspecified deep veins of left proximal lower extremity: Secondary | ICD-10-CM | POA: Diagnosis not present

## 2023-08-06 DIAGNOSIS — E1140 Type 2 diabetes mellitus with diabetic neuropathy, unspecified: Secondary | ICD-10-CM | POA: Diagnosis not present

## 2023-08-06 DIAGNOSIS — I129 Hypertensive chronic kidney disease with stage 1 through stage 4 chronic kidney disease, or unspecified chronic kidney disease: Secondary | ICD-10-CM | POA: Diagnosis not present

## 2023-08-06 DIAGNOSIS — N183 Chronic kidney disease, stage 3 unspecified: Secondary | ICD-10-CM | POA: Diagnosis not present

## 2023-08-06 DIAGNOSIS — M5030 Other cervical disc degeneration, unspecified cervical region: Secondary | ICD-10-CM | POA: Diagnosis not present

## 2023-08-06 DIAGNOSIS — L89623 Pressure ulcer of left heel, stage 3: Secondary | ICD-10-CM | POA: Diagnosis not present

## 2023-08-06 DIAGNOSIS — M1990 Unspecified osteoarthritis, unspecified site: Secondary | ICD-10-CM | POA: Diagnosis not present

## 2023-08-06 DIAGNOSIS — D631 Anemia in chronic kidney disease: Secondary | ICD-10-CM | POA: Diagnosis not present

## 2023-08-10 DIAGNOSIS — I129 Hypertensive chronic kidney disease with stage 1 through stage 4 chronic kidney disease, or unspecified chronic kidney disease: Secondary | ICD-10-CM | POA: Diagnosis not present

## 2023-08-10 DIAGNOSIS — M5030 Other cervical disc degeneration, unspecified cervical region: Secondary | ICD-10-CM | POA: Diagnosis not present

## 2023-08-10 DIAGNOSIS — I824Y2 Acute embolism and thrombosis of unspecified deep veins of left proximal lower extremity: Secondary | ICD-10-CM | POA: Diagnosis not present

## 2023-08-10 DIAGNOSIS — M1990 Unspecified osteoarthritis, unspecified site: Secondary | ICD-10-CM | POA: Diagnosis not present

## 2023-08-10 DIAGNOSIS — E1140 Type 2 diabetes mellitus with diabetic neuropathy, unspecified: Secondary | ICD-10-CM | POA: Diagnosis not present

## 2023-08-10 DIAGNOSIS — E1122 Type 2 diabetes mellitus with diabetic chronic kidney disease: Secondary | ICD-10-CM | POA: Diagnosis not present

## 2023-08-10 DIAGNOSIS — N183 Chronic kidney disease, stage 3 unspecified: Secondary | ICD-10-CM | POA: Diagnosis not present

## 2023-08-10 DIAGNOSIS — L89623 Pressure ulcer of left heel, stage 3: Secondary | ICD-10-CM | POA: Diagnosis not present

## 2023-08-10 DIAGNOSIS — D631 Anemia in chronic kidney disease: Secondary | ICD-10-CM | POA: Diagnosis not present

## 2023-08-13 DIAGNOSIS — I129 Hypertensive chronic kidney disease with stage 1 through stage 4 chronic kidney disease, or unspecified chronic kidney disease: Secondary | ICD-10-CM | POA: Diagnosis not present

## 2023-08-13 DIAGNOSIS — I824Y2 Acute embolism and thrombosis of unspecified deep veins of left proximal lower extremity: Secondary | ICD-10-CM | POA: Diagnosis not present

## 2023-08-13 DIAGNOSIS — N183 Chronic kidney disease, stage 3 unspecified: Secondary | ICD-10-CM | POA: Diagnosis not present

## 2023-08-13 DIAGNOSIS — M1990 Unspecified osteoarthritis, unspecified site: Secondary | ICD-10-CM | POA: Diagnosis not present

## 2023-08-13 DIAGNOSIS — E1122 Type 2 diabetes mellitus with diabetic chronic kidney disease: Secondary | ICD-10-CM | POA: Diagnosis not present

## 2023-08-13 DIAGNOSIS — M5030 Other cervical disc degeneration, unspecified cervical region: Secondary | ICD-10-CM | POA: Diagnosis not present

## 2023-08-13 DIAGNOSIS — D631 Anemia in chronic kidney disease: Secondary | ICD-10-CM | POA: Diagnosis not present

## 2023-08-13 DIAGNOSIS — L89623 Pressure ulcer of left heel, stage 3: Secondary | ICD-10-CM | POA: Diagnosis not present

## 2023-08-13 DIAGNOSIS — E1140 Type 2 diabetes mellitus with diabetic neuropathy, unspecified: Secondary | ICD-10-CM | POA: Diagnosis not present

## 2023-08-15 DIAGNOSIS — I2510 Atherosclerotic heart disease of native coronary artery without angina pectoris: Secondary | ICD-10-CM | POA: Diagnosis not present

## 2023-08-15 DIAGNOSIS — L89623 Pressure ulcer of left heel, stage 3: Secondary | ICD-10-CM | POA: Diagnosis not present

## 2023-08-15 DIAGNOSIS — E1140 Type 2 diabetes mellitus with diabetic neuropathy, unspecified: Secondary | ICD-10-CM | POA: Diagnosis not present

## 2023-08-15 DIAGNOSIS — K219 Gastro-esophageal reflux disease without esophagitis: Secondary | ICD-10-CM | POA: Diagnosis not present

## 2023-08-15 DIAGNOSIS — N182 Chronic kidney disease, stage 2 (mild): Secondary | ICD-10-CM | POA: Diagnosis not present

## 2023-08-15 DIAGNOSIS — Z96652 Presence of left artificial knee joint: Secondary | ICD-10-CM | POA: Diagnosis not present

## 2023-08-15 DIAGNOSIS — E11621 Type 2 diabetes mellitus with foot ulcer: Secondary | ICD-10-CM | POA: Diagnosis not present

## 2023-08-15 DIAGNOSIS — I129 Hypertensive chronic kidney disease with stage 1 through stage 4 chronic kidney disease, or unspecified chronic kidney disease: Secondary | ICD-10-CM | POA: Diagnosis not present

## 2023-08-15 DIAGNOSIS — E1122 Type 2 diabetes mellitus with diabetic chronic kidney disease: Secondary | ICD-10-CM | POA: Diagnosis not present

## 2023-08-15 DIAGNOSIS — I824Y2 Acute embolism and thrombosis of unspecified deep veins of left proximal lower extremity: Secondary | ICD-10-CM | POA: Diagnosis not present

## 2023-08-15 DIAGNOSIS — Z86718 Personal history of other venous thrombosis and embolism: Secondary | ICD-10-CM | POA: Diagnosis not present

## 2023-08-17 DIAGNOSIS — D631 Anemia in chronic kidney disease: Secondary | ICD-10-CM | POA: Diagnosis not present

## 2023-08-17 DIAGNOSIS — E1122 Type 2 diabetes mellitus with diabetic chronic kidney disease: Secondary | ICD-10-CM | POA: Diagnosis not present

## 2023-08-17 DIAGNOSIS — N183 Chronic kidney disease, stage 3 unspecified: Secondary | ICD-10-CM | POA: Diagnosis not present

## 2023-08-17 DIAGNOSIS — I824Y2 Acute embolism and thrombosis of unspecified deep veins of left proximal lower extremity: Secondary | ICD-10-CM | POA: Diagnosis not present

## 2023-08-17 DIAGNOSIS — I129 Hypertensive chronic kidney disease with stage 1 through stage 4 chronic kidney disease, or unspecified chronic kidney disease: Secondary | ICD-10-CM | POA: Diagnosis not present

## 2023-08-17 DIAGNOSIS — L89623 Pressure ulcer of left heel, stage 3: Secondary | ICD-10-CM | POA: Diagnosis not present

## 2023-08-17 DIAGNOSIS — E1140 Type 2 diabetes mellitus with diabetic neuropathy, unspecified: Secondary | ICD-10-CM | POA: Diagnosis not present

## 2023-08-17 DIAGNOSIS — M5030 Other cervical disc degeneration, unspecified cervical region: Secondary | ICD-10-CM | POA: Diagnosis not present

## 2023-08-17 DIAGNOSIS — M1990 Unspecified osteoarthritis, unspecified site: Secondary | ICD-10-CM | POA: Diagnosis not present

## 2023-08-19 DIAGNOSIS — M1990 Unspecified osteoarthritis, unspecified site: Secondary | ICD-10-CM | POA: Diagnosis not present

## 2023-08-19 DIAGNOSIS — E1140 Type 2 diabetes mellitus with diabetic neuropathy, unspecified: Secondary | ICD-10-CM | POA: Diagnosis not present

## 2023-08-19 DIAGNOSIS — N183 Chronic kidney disease, stage 3 unspecified: Secondary | ICD-10-CM | POA: Diagnosis not present

## 2023-08-19 DIAGNOSIS — I129 Hypertensive chronic kidney disease with stage 1 through stage 4 chronic kidney disease, or unspecified chronic kidney disease: Secondary | ICD-10-CM | POA: Diagnosis not present

## 2023-08-19 DIAGNOSIS — E1122 Type 2 diabetes mellitus with diabetic chronic kidney disease: Secondary | ICD-10-CM | POA: Diagnosis not present

## 2023-08-19 DIAGNOSIS — M5030 Other cervical disc degeneration, unspecified cervical region: Secondary | ICD-10-CM | POA: Diagnosis not present

## 2023-08-19 DIAGNOSIS — L89623 Pressure ulcer of left heel, stage 3: Secondary | ICD-10-CM | POA: Diagnosis not present

## 2023-08-19 DIAGNOSIS — I824Y2 Acute embolism and thrombosis of unspecified deep veins of left proximal lower extremity: Secondary | ICD-10-CM | POA: Diagnosis not present

## 2023-08-19 DIAGNOSIS — D631 Anemia in chronic kidney disease: Secondary | ICD-10-CM | POA: Diagnosis not present

## 2023-08-23 DIAGNOSIS — M5030 Other cervical disc degeneration, unspecified cervical region: Secondary | ICD-10-CM | POA: Diagnosis not present

## 2023-08-23 DIAGNOSIS — M1990 Unspecified osteoarthritis, unspecified site: Secondary | ICD-10-CM | POA: Diagnosis not present

## 2023-08-23 DIAGNOSIS — I129 Hypertensive chronic kidney disease with stage 1 through stage 4 chronic kidney disease, or unspecified chronic kidney disease: Secondary | ICD-10-CM | POA: Diagnosis not present

## 2023-08-23 DIAGNOSIS — L89623 Pressure ulcer of left heel, stage 3: Secondary | ICD-10-CM | POA: Diagnosis not present

## 2023-08-23 DIAGNOSIS — E1122 Type 2 diabetes mellitus with diabetic chronic kidney disease: Secondary | ICD-10-CM | POA: Diagnosis not present

## 2023-08-23 DIAGNOSIS — N183 Chronic kidney disease, stage 3 unspecified: Secondary | ICD-10-CM | POA: Diagnosis not present

## 2023-08-23 DIAGNOSIS — D631 Anemia in chronic kidney disease: Secondary | ICD-10-CM | POA: Diagnosis not present

## 2023-08-23 DIAGNOSIS — I824Y2 Acute embolism and thrombosis of unspecified deep veins of left proximal lower extremity: Secondary | ICD-10-CM | POA: Diagnosis not present

## 2023-08-23 DIAGNOSIS — E1140 Type 2 diabetes mellitus with diabetic neuropathy, unspecified: Secondary | ICD-10-CM | POA: Diagnosis not present

## 2023-08-26 DIAGNOSIS — E1140 Type 2 diabetes mellitus with diabetic neuropathy, unspecified: Secondary | ICD-10-CM | POA: Diagnosis not present

## 2023-08-26 DIAGNOSIS — L89623 Pressure ulcer of left heel, stage 3: Secondary | ICD-10-CM | POA: Diagnosis not present

## 2023-08-26 DIAGNOSIS — E1122 Type 2 diabetes mellitus with diabetic chronic kidney disease: Secondary | ICD-10-CM | POA: Diagnosis not present

## 2023-08-26 DIAGNOSIS — I129 Hypertensive chronic kidney disease with stage 1 through stage 4 chronic kidney disease, or unspecified chronic kidney disease: Secondary | ICD-10-CM | POA: Diagnosis not present

## 2023-08-26 DIAGNOSIS — N183 Chronic kidney disease, stage 3 unspecified: Secondary | ICD-10-CM | POA: Diagnosis not present

## 2023-08-26 DIAGNOSIS — M1990 Unspecified osteoarthritis, unspecified site: Secondary | ICD-10-CM | POA: Diagnosis not present

## 2023-08-26 DIAGNOSIS — D631 Anemia in chronic kidney disease: Secondary | ICD-10-CM | POA: Diagnosis not present

## 2023-08-26 DIAGNOSIS — M5030 Other cervical disc degeneration, unspecified cervical region: Secondary | ICD-10-CM | POA: Diagnosis not present

## 2023-08-26 DIAGNOSIS — I824Y2 Acute embolism and thrombosis of unspecified deep veins of left proximal lower extremity: Secondary | ICD-10-CM | POA: Diagnosis not present

## 2023-08-27 DIAGNOSIS — E1122 Type 2 diabetes mellitus with diabetic chronic kidney disease: Secondary | ICD-10-CM | POA: Diagnosis not present

## 2023-08-27 DIAGNOSIS — E1140 Type 2 diabetes mellitus with diabetic neuropathy, unspecified: Secondary | ICD-10-CM | POA: Diagnosis not present

## 2023-08-27 DIAGNOSIS — I824Y2 Acute embolism and thrombosis of unspecified deep veins of left proximal lower extremity: Secondary | ICD-10-CM | POA: Diagnosis not present

## 2023-08-27 DIAGNOSIS — D631 Anemia in chronic kidney disease: Secondary | ICD-10-CM | POA: Diagnosis not present

## 2023-08-27 DIAGNOSIS — M1990 Unspecified osteoarthritis, unspecified site: Secondary | ICD-10-CM | POA: Diagnosis not present

## 2023-08-27 DIAGNOSIS — L89623 Pressure ulcer of left heel, stage 3: Secondary | ICD-10-CM | POA: Diagnosis not present

## 2023-08-27 DIAGNOSIS — N183 Chronic kidney disease, stage 3 unspecified: Secondary | ICD-10-CM | POA: Diagnosis not present

## 2023-08-27 DIAGNOSIS — I129 Hypertensive chronic kidney disease with stage 1 through stage 4 chronic kidney disease, or unspecified chronic kidney disease: Secondary | ICD-10-CM | POA: Diagnosis not present

## 2023-08-27 DIAGNOSIS — M5030 Other cervical disc degeneration, unspecified cervical region: Secondary | ICD-10-CM | POA: Diagnosis not present

## 2023-08-29 DIAGNOSIS — M5030 Other cervical disc degeneration, unspecified cervical region: Secondary | ICD-10-CM | POA: Diagnosis not present

## 2023-08-29 DIAGNOSIS — M1990 Unspecified osteoarthritis, unspecified site: Secondary | ICD-10-CM | POA: Diagnosis not present

## 2023-08-29 DIAGNOSIS — N183 Chronic kidney disease, stage 3 unspecified: Secondary | ICD-10-CM | POA: Diagnosis not present

## 2023-08-29 DIAGNOSIS — L89623 Pressure ulcer of left heel, stage 3: Secondary | ICD-10-CM | POA: Diagnosis not present

## 2023-08-29 DIAGNOSIS — I824Y2 Acute embolism and thrombosis of unspecified deep veins of left proximal lower extremity: Secondary | ICD-10-CM | POA: Diagnosis not present

## 2023-08-29 DIAGNOSIS — E1140 Type 2 diabetes mellitus with diabetic neuropathy, unspecified: Secondary | ICD-10-CM | POA: Diagnosis not present

## 2023-08-29 DIAGNOSIS — I129 Hypertensive chronic kidney disease with stage 1 through stage 4 chronic kidney disease, or unspecified chronic kidney disease: Secondary | ICD-10-CM | POA: Diagnosis not present

## 2023-08-29 DIAGNOSIS — E1122 Type 2 diabetes mellitus with diabetic chronic kidney disease: Secondary | ICD-10-CM | POA: Diagnosis not present

## 2023-08-29 DIAGNOSIS — D631 Anemia in chronic kidney disease: Secondary | ICD-10-CM | POA: Diagnosis not present

## 2023-08-31 DIAGNOSIS — Z7901 Long term (current) use of anticoagulants: Secondary | ICD-10-CM | POA: Diagnosis not present

## 2023-08-31 DIAGNOSIS — I824Y2 Acute embolism and thrombosis of unspecified deep veins of left proximal lower extremity: Secondary | ICD-10-CM | POA: Diagnosis not present

## 2023-08-31 DIAGNOSIS — I129 Hypertensive chronic kidney disease with stage 1 through stage 4 chronic kidney disease, or unspecified chronic kidney disease: Secondary | ICD-10-CM | POA: Diagnosis not present

## 2023-08-31 DIAGNOSIS — E1122 Type 2 diabetes mellitus with diabetic chronic kidney disease: Secondary | ICD-10-CM | POA: Diagnosis not present

## 2023-08-31 DIAGNOSIS — E1140 Type 2 diabetes mellitus with diabetic neuropathy, unspecified: Secondary | ICD-10-CM | POA: Diagnosis not present

## 2023-08-31 DIAGNOSIS — M5030 Other cervical disc degeneration, unspecified cervical region: Secondary | ICD-10-CM | POA: Diagnosis not present

## 2023-08-31 DIAGNOSIS — D631 Anemia in chronic kidney disease: Secondary | ICD-10-CM | POA: Diagnosis not present

## 2023-08-31 DIAGNOSIS — N183 Chronic kidney disease, stage 3 unspecified: Secondary | ICD-10-CM | POA: Diagnosis not present

## 2023-08-31 DIAGNOSIS — R030 Elevated blood-pressure reading, without diagnosis of hypertension: Secondary | ICD-10-CM | POA: Diagnosis not present

## 2023-08-31 DIAGNOSIS — L89623 Pressure ulcer of left heel, stage 3: Secondary | ICD-10-CM | POA: Diagnosis not present

## 2023-08-31 DIAGNOSIS — M1990 Unspecified osteoarthritis, unspecified site: Secondary | ICD-10-CM | POA: Diagnosis not present

## 2023-09-03 DIAGNOSIS — D631 Anemia in chronic kidney disease: Secondary | ICD-10-CM | POA: Diagnosis not present

## 2023-09-03 DIAGNOSIS — E1122 Type 2 diabetes mellitus with diabetic chronic kidney disease: Secondary | ICD-10-CM | POA: Diagnosis not present

## 2023-09-03 DIAGNOSIS — N183 Chronic kidney disease, stage 3 unspecified: Secondary | ICD-10-CM | POA: Diagnosis not present

## 2023-09-03 DIAGNOSIS — E1140 Type 2 diabetes mellitus with diabetic neuropathy, unspecified: Secondary | ICD-10-CM | POA: Diagnosis not present

## 2023-09-03 DIAGNOSIS — I129 Hypertensive chronic kidney disease with stage 1 through stage 4 chronic kidney disease, or unspecified chronic kidney disease: Secondary | ICD-10-CM | POA: Diagnosis not present

## 2023-09-03 DIAGNOSIS — M1990 Unspecified osteoarthritis, unspecified site: Secondary | ICD-10-CM | POA: Diagnosis not present

## 2023-09-03 DIAGNOSIS — L89623 Pressure ulcer of left heel, stage 3: Secondary | ICD-10-CM | POA: Diagnosis not present

## 2023-09-03 DIAGNOSIS — I824Y2 Acute embolism and thrombosis of unspecified deep veins of left proximal lower extremity: Secondary | ICD-10-CM | POA: Diagnosis not present

## 2023-09-03 DIAGNOSIS — M5030 Other cervical disc degeneration, unspecified cervical region: Secondary | ICD-10-CM | POA: Diagnosis not present

## 2023-09-05 DIAGNOSIS — I252 Old myocardial infarction: Secondary | ICD-10-CM | POA: Diagnosis not present

## 2023-09-05 DIAGNOSIS — D649 Anemia, unspecified: Secondary | ICD-10-CM | POA: Diagnosis not present

## 2023-09-05 DIAGNOSIS — I129 Hypertensive chronic kidney disease with stage 1 through stage 4 chronic kidney disease, or unspecified chronic kidney disease: Secondary | ICD-10-CM | POA: Diagnosis not present

## 2023-09-05 DIAGNOSIS — E785 Hyperlipidemia, unspecified: Secondary | ICD-10-CM | POA: Diagnosis not present

## 2023-09-05 DIAGNOSIS — Z7901 Long term (current) use of anticoagulants: Secondary | ICD-10-CM | POA: Diagnosis not present

## 2023-09-05 DIAGNOSIS — D519 Vitamin B12 deficiency anemia, unspecified: Secondary | ICD-10-CM | POA: Diagnosis not present

## 2023-09-05 DIAGNOSIS — E1122 Type 2 diabetes mellitus with diabetic chronic kidney disease: Secondary | ICD-10-CM | POA: Diagnosis not present

## 2023-09-05 DIAGNOSIS — L89623 Pressure ulcer of left heel, stage 3: Secondary | ICD-10-CM | POA: Diagnosis not present

## 2023-09-05 DIAGNOSIS — D529 Folate deficiency anemia, unspecified: Secondary | ICD-10-CM | POA: Diagnosis not present

## 2023-09-05 DIAGNOSIS — E1140 Type 2 diabetes mellitus with diabetic neuropathy, unspecified: Secondary | ICD-10-CM | POA: Diagnosis not present

## 2023-09-05 DIAGNOSIS — N1831 Chronic kidney disease, stage 3a: Secondary | ICD-10-CM | POA: Diagnosis not present

## 2023-09-05 DIAGNOSIS — E7849 Other hyperlipidemia: Secondary | ICD-10-CM | POA: Diagnosis not present

## 2023-09-05 DIAGNOSIS — E782 Mixed hyperlipidemia: Secondary | ICD-10-CM | POA: Diagnosis not present

## 2023-09-05 DIAGNOSIS — N182 Chronic kidney disease, stage 2 (mild): Secondary | ICD-10-CM | POA: Diagnosis not present

## 2023-09-05 DIAGNOSIS — I2510 Atherosclerotic heart disease of native coronary artery without angina pectoris: Secondary | ICD-10-CM | POA: Diagnosis not present

## 2023-09-06 ENCOUNTER — Other Ambulatory Visit (INDEPENDENT_AMBULATORY_CARE_PROVIDER_SITE_OTHER)

## 2023-09-06 ENCOUNTER — Ambulatory Visit (INDEPENDENT_AMBULATORY_CARE_PROVIDER_SITE_OTHER): Payer: Medicare HMO | Admitting: Physician Assistant

## 2023-09-06 ENCOUNTER — Encounter: Payer: Self-pay | Admitting: Physician Assistant

## 2023-09-06 DIAGNOSIS — Z96652 Presence of left artificial knee joint: Secondary | ICD-10-CM | POA: Diagnosis not present

## 2023-09-06 NOTE — Progress Notes (Addendum)
 Office Visit Note   Patient: [name] [name]           Date of Birth: 1950-10-05           MRN: [id number] Visit Date: 09/06/2023              Requested by: Lari Elspeth BRAVO, MD [address] [place],  [place] [address] PCP: Lari Elspeth BRAVO, MD   Assessment & Plan: Visit Diagnoses:  1. History of left knee replacement     Plan: She will follow-up with us  in a year postop at that time we will obtain an AP and lateral view of the left knee.  Will also obtain a Doppler of her left lower leg to reevaluate the DVT.  Will be in touch with her after this Doppler to go over the results and discuss further action in regards to anticoagulation. Follow-Up Instructions: No follow-ups on file.   Orders:  Orders Placed This Encounter  Procedures   XR Knee 1-2 Views Left   No orders of the defined types were placed in this encounter.     Procedures: No procedures performed   Clinical Data: No additional findings.   Subjective: Chief Complaint  Patient presents with   Left Knee - Follow-up    HPI [name] [name] returns today follow-up left total knee arthroplasty 02/13/2023.  She is someone who unfortunately developed a DVT postop.  She remains on anticoagulation.  She states overall she is doing well.  She does have some swelling about the knee at times.  She ambulates with a cane.  She continues to see wound care for her heel wound and actually has a wound VAC on it and a postop shoe.Patient is currently on Coumadin for DVT prophylaxis.  Review of Systems Negative for fevers chills.  Objective: Vital Signs: There were no vitals taken for this visit.  Physical Exam General: Well-developed well-nourished female no acute distress mood and affect appropriate. Ortho Exam Left knee: Full extension flexion to approximately 105 degrees.  No instability valgus varus stressing.  Surgical incisions well-healed. Specialty Comments:  No specialty comments available.  Imaging: No results  found.   PMFS History: Patient Active Problem List   Diagnosis Date Noted   Status post total left knee replacement 02/13/2023   Cervical spinal stenosis 11/23/2022   Status post total right knee replacement 12/16/2021   Chronic RLQ pain 04/29/2021   Periumbilical pain 04/29/2021   Spinal stenosis of lumbar region with neurogenic claudication    Spondylolisthesis, lumbar region    Other spondylosis with radiculopathy, lumbar region    Acute postoperative anemia due to expected blood loss 02/01/2021    Class: Acute   Fusion of spine of lumbar region 01/31/2021   S/P lumbar fusion L4 to Sacrum 06/22/09 10/11/2020   Myalgia and myositis 06/12/2018   Other and unspecified nonspecific immunological findings 04/25/2018   Osteoarthrosis, hand 10/19/2017   Insomnia 09/23/2015   Plantar fascial fibromatosis 09/23/2015   Shortness of breath 04/28/2015   Abnormal myocardial perfusion study    Disturbance of skin sensation 08/05/2014   Chronic kidney disease, stage III (moderate) (HCC) 02/02/2014   Impaired fasting glucose 02/02/2014   Vitamin D  deficiency 10/30/2013   Other osteoporosis 07/29/2012   Myocardial infarction (HCC) 12/29/2010   Cardiovascular disease 05/26/2009   KNEE PAIN, LEFT 07/02/2008   HIP PAIN 04/27/2008   OBESITY 02/24/2008   HYPERGLYCEMIA, FASTING 02/10/2008   GERD 03/12/2007   ANXIETY DEPRESSION 08/28/2006   DEGENERATIVE DISC  DISEASE 08/28/2006   HYPERLIPIDEMIA 05/29/2006   CARPAL TUNNEL SYNDROME 05/29/2006   Essential hypertension 05/29/2006   Constipation 05/29/2006   IBS 05/29/2006   Arthropathy 05/29/2006   LOW BACK PAIN 05/29/2006   Past Medical History:  Diagnosis Date   Arthritis    Carpal tunnel syndrome    Chronic kidney disease (CKD), stage II (mild)    Constipation    Coronary artery disease    NSTEMI 2004, DES to ostial RCA   DDD (degenerative disc disease)    Essential hypertension    GERD (gastroesophageal reflux disease)     Hyperlipidemia    IBS (irritable bowel syndrome)    Low back pain    Myocardial infarction [facility]) 2004   one stent placed   Neuropathy    bilateral hands r/t cervical spine issues   Pneumonia    Postmenopausal    Pulmonary nodule    Sleep apnea    waiting on machine to come.   Type 2 diabetes mellitus (HCC)    Vertigo     Family History  Problem Relation Age of Onset   Stroke Mother    Diabetes Mellitus II Mother    Kidney disease Father    Hypertension Brother    Diabetes Mellitus II Brother    Diabetes Mellitus II Sister     Past Surgical History:  Procedure Laterality Date   ABDOMINAL HYSTERECTOMY     partial. Pt was in her 20s.   ANTERIOR CERVICAL DECOMP/DISCECTOMY FUSION N/A 11/23/2022   Procedure: Anterior Cervical Decompression Fusion Cervical three-four, Cervical four-five;  Surgeon: Dawley, Lani BROCKS, DO;  Location: [facility] OR;  Service: Neurosurgery;  Laterality: N/A;   CARDIAC CATHETERIZATION N/A 04/28/2015   Procedure: Left Heart Cath and Coronary Angiography;  Surgeon: Ozell Fell, MD;  Location: [facility] INVASIVE CV LAB;  Service: Cardiovascular;  Laterality: N/A;   CATARACT EXTRACTION Bilateral    CATARACT EXTRACTION W/PHACO Right 07/19/2020   Procedure: CATARACT EXTRACTION PHACO AND INTRAOCULAR LENS PLACEMENT RIGHT EYE;  Surgeon: Harrie Agent, MD;  Location: [facility] ORS;  Service: Ophthalmology;  Laterality: Right;  right CDE=12.56   CATARACT EXTRACTION W/PHACO Left 08/09/2020   Procedure: CATARACT EXTRACTION PHACO AND INTRAOCULAR LENS PLACEMENT (IOC);  Surgeon: Harrie Agent, MD;  Location: [facility] ORS;  Service: Ophthalmology;  Laterality: Left;  CDE: 6.66   Cervical disectomy and fusion     Cholestectomy     COLONOSCOPY N/A 05/07/2019   Procedure: COLONOSCOPY;  Surgeon: Shaaron Lamar HERO, MD;  Location: [facility] ENDO SUITE;  Service: Endoscopy;  Laterality: N/A;  12:00   Excision of ovarian cyst     L4-5 with pedicle screws and rods     x2   MULTIPLE TOOTH EXTRACTIONS     Right  shoulder RTC     TOTAL KNEE ARTHROPLASTY Right 12/16/2021   Procedure: RIGHT TOTAL KNEE ARTHROPLASTY;  Surgeon: Vernetta Lonni GRADE, MD;  Location: [facility] ORS;  Service: Orthopedics;  Laterality: Right;   TOTAL KNEE ARTHROPLASTY Left 02/13/2023   Procedure: LEFT TOTAL KNEE ARTHROPLASTY;  Surgeon: Vernetta Lonni GRADE, MD;  Location: [facility] ORS;  Service: Orthopedics;  Laterality: Left;   Social History   Occupational History   Occupation: Psychiatric nurse, Government social research officer  Tobacco Use   Smoking status: Never   Smokeless tobacco: Never  Vaping Use   Vaping status: Never Used  Substance and Sexual Activity   Alcohol  use: No    Alcohol /week: 0.0 standard drinks of alcohol    Drug use: No   Sexual activity: Never

## 2023-09-07 DIAGNOSIS — I824Y2 Acute embolism and thrombosis of unspecified deep veins of left proximal lower extremity: Secondary | ICD-10-CM | POA: Diagnosis not present

## 2023-09-07 DIAGNOSIS — E1140 Type 2 diabetes mellitus with diabetic neuropathy, unspecified: Secondary | ICD-10-CM | POA: Diagnosis not present

## 2023-09-07 DIAGNOSIS — N183 Chronic kidney disease, stage 3 unspecified: Secondary | ICD-10-CM | POA: Diagnosis not present

## 2023-09-07 DIAGNOSIS — M1990 Unspecified osteoarthritis, unspecified site: Secondary | ICD-10-CM | POA: Diagnosis not present

## 2023-09-07 DIAGNOSIS — M5030 Other cervical disc degeneration, unspecified cervical region: Secondary | ICD-10-CM | POA: Diagnosis not present

## 2023-09-07 DIAGNOSIS — E1122 Type 2 diabetes mellitus with diabetic chronic kidney disease: Secondary | ICD-10-CM | POA: Diagnosis not present

## 2023-09-07 DIAGNOSIS — L89623 Pressure ulcer of left heel, stage 3: Secondary | ICD-10-CM | POA: Diagnosis not present

## 2023-09-07 DIAGNOSIS — I129 Hypertensive chronic kidney disease with stage 1 through stage 4 chronic kidney disease, or unspecified chronic kidney disease: Secondary | ICD-10-CM | POA: Diagnosis not present

## 2023-09-07 DIAGNOSIS — D631 Anemia in chronic kidney disease: Secondary | ICD-10-CM | POA: Diagnosis not present

## 2023-09-10 DIAGNOSIS — M1990 Unspecified osteoarthritis, unspecified site: Secondary | ICD-10-CM | POA: Diagnosis not present

## 2023-09-10 DIAGNOSIS — I824Y2 Acute embolism and thrombosis of unspecified deep veins of left proximal lower extremity: Secondary | ICD-10-CM | POA: Diagnosis not present

## 2023-09-10 DIAGNOSIS — D631 Anemia in chronic kidney disease: Secondary | ICD-10-CM | POA: Diagnosis not present

## 2023-09-10 DIAGNOSIS — E1140 Type 2 diabetes mellitus with diabetic neuropathy, unspecified: Secondary | ICD-10-CM | POA: Diagnosis not present

## 2023-09-10 DIAGNOSIS — L89623 Pressure ulcer of left heel, stage 3: Secondary | ICD-10-CM | POA: Diagnosis not present

## 2023-09-10 DIAGNOSIS — I129 Hypertensive chronic kidney disease with stage 1 through stage 4 chronic kidney disease, or unspecified chronic kidney disease: Secondary | ICD-10-CM | POA: Diagnosis not present

## 2023-09-10 DIAGNOSIS — M5030 Other cervical disc degeneration, unspecified cervical region: Secondary | ICD-10-CM | POA: Diagnosis not present

## 2023-09-10 DIAGNOSIS — N183 Chronic kidney disease, stage 3 unspecified: Secondary | ICD-10-CM | POA: Diagnosis not present

## 2023-09-10 DIAGNOSIS — E1122 Type 2 diabetes mellitus with diabetic chronic kidney disease: Secondary | ICD-10-CM | POA: Diagnosis not present

## 2023-09-11 DIAGNOSIS — M5030 Other cervical disc degeneration, unspecified cervical region: Secondary | ICD-10-CM | POA: Diagnosis not present

## 2023-09-11 DIAGNOSIS — I129 Hypertensive chronic kidney disease with stage 1 through stage 4 chronic kidney disease, or unspecified chronic kidney disease: Secondary | ICD-10-CM | POA: Diagnosis not present

## 2023-09-11 DIAGNOSIS — D631 Anemia in chronic kidney disease: Secondary | ICD-10-CM | POA: Diagnosis not present

## 2023-09-11 DIAGNOSIS — L89623 Pressure ulcer of left heel, stage 3: Secondary | ICD-10-CM | POA: Diagnosis not present

## 2023-09-11 DIAGNOSIS — M1990 Unspecified osteoarthritis, unspecified site: Secondary | ICD-10-CM | POA: Diagnosis not present

## 2023-09-11 DIAGNOSIS — N183 Chronic kidney disease, stage 3 unspecified: Secondary | ICD-10-CM | POA: Diagnosis not present

## 2023-09-11 DIAGNOSIS — I824Y2 Acute embolism and thrombosis of unspecified deep veins of left proximal lower extremity: Secondary | ICD-10-CM | POA: Diagnosis not present

## 2023-09-11 DIAGNOSIS — E1122 Type 2 diabetes mellitus with diabetic chronic kidney disease: Secondary | ICD-10-CM | POA: Diagnosis not present

## 2023-09-11 DIAGNOSIS — E1140 Type 2 diabetes mellitus with diabetic neuropathy, unspecified: Secondary | ICD-10-CM | POA: Diagnosis not present

## 2023-09-12 DIAGNOSIS — E1140 Type 2 diabetes mellitus with diabetic neuropathy, unspecified: Secondary | ICD-10-CM | POA: Diagnosis not present

## 2023-09-12 DIAGNOSIS — Z7901 Long term (current) use of anticoagulants: Secondary | ICD-10-CM | POA: Diagnosis not present

## 2023-09-12 DIAGNOSIS — M2550 Pain in unspecified joint: Secondary | ICD-10-CM | POA: Diagnosis not present

## 2023-09-12 DIAGNOSIS — R300 Dysuria: Secondary | ICD-10-CM | POA: Diagnosis not present

## 2023-09-12 DIAGNOSIS — Z6832 Body mass index (BMI) 32.0-32.9, adult: Secondary | ICD-10-CM | POA: Diagnosis not present

## 2023-09-12 DIAGNOSIS — E782 Mixed hyperlipidemia: Secondary | ICD-10-CM | POA: Diagnosis not present

## 2023-09-14 DIAGNOSIS — E1122 Type 2 diabetes mellitus with diabetic chronic kidney disease: Secondary | ICD-10-CM | POA: Diagnosis not present

## 2023-09-14 DIAGNOSIS — I129 Hypertensive chronic kidney disease with stage 1 through stage 4 chronic kidney disease, or unspecified chronic kidney disease: Secondary | ICD-10-CM | POA: Diagnosis not present

## 2023-09-14 DIAGNOSIS — M5030 Other cervical disc degeneration, unspecified cervical region: Secondary | ICD-10-CM | POA: Diagnosis not present

## 2023-09-14 DIAGNOSIS — M1990 Unspecified osteoarthritis, unspecified site: Secondary | ICD-10-CM | POA: Diagnosis not present

## 2023-09-14 DIAGNOSIS — I824Y2 Acute embolism and thrombosis of unspecified deep veins of left proximal lower extremity: Secondary | ICD-10-CM | POA: Diagnosis not present

## 2023-09-14 DIAGNOSIS — N183 Chronic kidney disease, stage 3 unspecified: Secondary | ICD-10-CM | POA: Diagnosis not present

## 2023-09-14 DIAGNOSIS — E1140 Type 2 diabetes mellitus with diabetic neuropathy, unspecified: Secondary | ICD-10-CM | POA: Diagnosis not present

## 2023-09-14 DIAGNOSIS — L89623 Pressure ulcer of left heel, stage 3: Secondary | ICD-10-CM | POA: Diagnosis not present

## 2023-09-14 DIAGNOSIS — D631 Anemia in chronic kidney disease: Secondary | ICD-10-CM | POA: Diagnosis not present

## 2023-09-17 ENCOUNTER — Ambulatory Visit (HOSPITAL_COMMUNITY)
Admission: RE | Admit: 2023-09-17 | Discharge: 2023-09-17 | Disposition: A | Discharge status: Home / Self Care | Payer: Medicare HMO | Source: Ambulatory Visit | Attending: Orthopaedic Surgery | Admitting: Orthopaedic Surgery

## 2023-09-17 DIAGNOSIS — M79662 Pain in left lower leg: Secondary | ICD-10-CM | POA: Diagnosis not present

## 2023-09-17 DIAGNOSIS — Z96652 Presence of left artificial knee joint: Secondary | ICD-10-CM

## 2023-09-17 DIAGNOSIS — M1990 Unspecified osteoarthritis, unspecified site: Secondary | ICD-10-CM | POA: Diagnosis not present

## 2023-09-17 DIAGNOSIS — L89623 Pressure ulcer of left heel, stage 3: Secondary | ICD-10-CM | POA: Diagnosis not present

## 2023-09-17 DIAGNOSIS — E1122 Type 2 diabetes mellitus with diabetic chronic kidney disease: Secondary | ICD-10-CM | POA: Diagnosis not present

## 2023-09-17 DIAGNOSIS — N183 Chronic kidney disease, stage 3 unspecified: Secondary | ICD-10-CM | POA: Diagnosis not present

## 2023-09-17 DIAGNOSIS — D631 Anemia in chronic kidney disease: Secondary | ICD-10-CM | POA: Diagnosis not present

## 2023-09-17 DIAGNOSIS — M5030 Other cervical disc degeneration, unspecified cervical region: Secondary | ICD-10-CM | POA: Diagnosis not present

## 2023-09-17 DIAGNOSIS — I824Y2 Acute embolism and thrombosis of unspecified deep veins of left proximal lower extremity: Secondary | ICD-10-CM | POA: Diagnosis not present

## 2023-09-17 DIAGNOSIS — E1140 Type 2 diabetes mellitus with diabetic neuropathy, unspecified: Secondary | ICD-10-CM | POA: Diagnosis not present

## 2023-09-17 DIAGNOSIS — I129 Hypertensive chronic kidney disease with stage 1 through stage 4 chronic kidney disease, or unspecified chronic kidney disease: Secondary | ICD-10-CM | POA: Diagnosis not present

## 2023-09-19 ENCOUNTER — Telehealth: Payer: Self-pay | Admitting: Physician Assistant

## 2023-09-19 DIAGNOSIS — L89623 Pressure ulcer of left heel, stage 3: Secondary | ICD-10-CM | POA: Diagnosis not present

## 2023-09-19 DIAGNOSIS — I2510 Atherosclerotic heart disease of native coronary artery without angina pectoris: Secondary | ICD-10-CM | POA: Diagnosis not present

## 2023-09-19 DIAGNOSIS — I252 Old myocardial infarction: Secondary | ICD-10-CM | POA: Diagnosis not present

## 2023-09-19 DIAGNOSIS — E785 Hyperlipidemia, unspecified: Secondary | ICD-10-CM | POA: Diagnosis not present

## 2023-09-19 DIAGNOSIS — E1140 Type 2 diabetes mellitus with diabetic neuropathy, unspecified: Secondary | ICD-10-CM | POA: Diagnosis not present

## 2023-09-19 DIAGNOSIS — Z7901 Long term (current) use of anticoagulants: Secondary | ICD-10-CM | POA: Diagnosis not present

## 2023-09-19 DIAGNOSIS — N182 Chronic kidney disease, stage 2 (mild): Secondary | ICD-10-CM | POA: Diagnosis not present

## 2023-09-19 DIAGNOSIS — I129 Hypertensive chronic kidney disease with stage 1 through stage 4 chronic kidney disease, or unspecified chronic kidney disease: Secondary | ICD-10-CM | POA: Diagnosis not present

## 2023-09-19 DIAGNOSIS — E1122 Type 2 diabetes mellitus with diabetic chronic kidney disease: Secondary | ICD-10-CM | POA: Diagnosis not present

## 2023-09-19 NOTE — Telephone Encounter (Signed)
 Pt called requesting a call back concerning a test done for blood clots and waiting for call from [facility] with results. Pt phone number is [phone].

## 2023-09-24 DIAGNOSIS — E1122 Type 2 diabetes mellitus with diabetic chronic kidney disease: Secondary | ICD-10-CM | POA: Diagnosis not present

## 2023-09-24 DIAGNOSIS — I824Y2 Acute embolism and thrombosis of unspecified deep veins of left proximal lower extremity: Secondary | ICD-10-CM | POA: Diagnosis not present

## 2023-09-24 DIAGNOSIS — I129 Hypertensive chronic kidney disease with stage 1 through stage 4 chronic kidney disease, or unspecified chronic kidney disease: Secondary | ICD-10-CM | POA: Diagnosis not present

## 2023-09-24 DIAGNOSIS — D631 Anemia in chronic kidney disease: Secondary | ICD-10-CM | POA: Diagnosis not present

## 2023-09-24 DIAGNOSIS — L89623 Pressure ulcer of left heel, stage 3: Secondary | ICD-10-CM | POA: Diagnosis not present

## 2023-09-24 DIAGNOSIS — E1140 Type 2 diabetes mellitus with diabetic neuropathy, unspecified: Secondary | ICD-10-CM | POA: Diagnosis not present

## 2023-09-24 DIAGNOSIS — N183 Chronic kidney disease, stage 3 unspecified: Secondary | ICD-10-CM | POA: Diagnosis not present

## 2023-09-24 DIAGNOSIS — M1990 Unspecified osteoarthritis, unspecified site: Secondary | ICD-10-CM | POA: Diagnosis not present

## 2023-09-24 DIAGNOSIS — M5030 Other cervical disc degeneration, unspecified cervical region: Secondary | ICD-10-CM | POA: Diagnosis not present

## 2023-09-25 DIAGNOSIS — M5030 Other cervical disc degeneration, unspecified cervical region: Secondary | ICD-10-CM | POA: Diagnosis not present

## 2023-09-25 DIAGNOSIS — N183 Chronic kidney disease, stage 3 unspecified: Secondary | ICD-10-CM | POA: Diagnosis not present

## 2023-09-25 DIAGNOSIS — M1990 Unspecified osteoarthritis, unspecified site: Secondary | ICD-10-CM | POA: Diagnosis not present

## 2023-09-25 DIAGNOSIS — D631 Anemia in chronic kidney disease: Secondary | ICD-10-CM | POA: Diagnosis not present

## 2023-09-25 DIAGNOSIS — E1140 Type 2 diabetes mellitus with diabetic neuropathy, unspecified: Secondary | ICD-10-CM | POA: Diagnosis not present

## 2023-09-25 DIAGNOSIS — L89623 Pressure ulcer of left heel, stage 3: Secondary | ICD-10-CM | POA: Diagnosis not present

## 2023-09-25 DIAGNOSIS — I129 Hypertensive chronic kidney disease with stage 1 through stage 4 chronic kidney disease, or unspecified chronic kidney disease: Secondary | ICD-10-CM | POA: Diagnosis not present

## 2023-09-25 DIAGNOSIS — E1122 Type 2 diabetes mellitus with diabetic chronic kidney disease: Secondary | ICD-10-CM | POA: Diagnosis not present

## 2023-09-25 DIAGNOSIS — I824Y2 Acute embolism and thrombosis of unspecified deep veins of left proximal lower extremity: Secondary | ICD-10-CM | POA: Diagnosis not present

## 2023-09-26 DIAGNOSIS — E785 Hyperlipidemia, unspecified: Secondary | ICD-10-CM | POA: Diagnosis not present

## 2023-09-26 DIAGNOSIS — N182 Chronic kidney disease, stage 2 (mild): Secondary | ICD-10-CM | POA: Diagnosis not present

## 2023-09-26 DIAGNOSIS — I129 Hypertensive chronic kidney disease with stage 1 through stage 4 chronic kidney disease, or unspecified chronic kidney disease: Secondary | ICD-10-CM | POA: Diagnosis not present

## 2023-09-26 DIAGNOSIS — L89623 Pressure ulcer of left heel, stage 3: Secondary | ICD-10-CM | POA: Diagnosis not present

## 2023-09-26 DIAGNOSIS — E1122 Type 2 diabetes mellitus with diabetic chronic kidney disease: Secondary | ICD-10-CM | POA: Diagnosis not present

## 2023-09-26 DIAGNOSIS — I252 Old myocardial infarction: Secondary | ICD-10-CM | POA: Diagnosis not present

## 2023-09-26 DIAGNOSIS — Z7901 Long term (current) use of anticoagulants: Secondary | ICD-10-CM | POA: Diagnosis not present

## 2023-09-26 DIAGNOSIS — E1140 Type 2 diabetes mellitus with diabetic neuropathy, unspecified: Secondary | ICD-10-CM | POA: Diagnosis not present

## 2023-09-26 DIAGNOSIS — I2510 Atherosclerotic heart disease of native coronary artery without angina pectoris: Secondary | ICD-10-CM | POA: Diagnosis not present

## 2023-09-28 DIAGNOSIS — I824Y2 Acute embolism and thrombosis of unspecified deep veins of left proximal lower extremity: Secondary | ICD-10-CM | POA: Diagnosis not present

## 2023-09-28 DIAGNOSIS — E1122 Type 2 diabetes mellitus with diabetic chronic kidney disease: Secondary | ICD-10-CM | POA: Diagnosis not present

## 2023-09-28 DIAGNOSIS — I129 Hypertensive chronic kidney disease with stage 1 through stage 4 chronic kidney disease, or unspecified chronic kidney disease: Secondary | ICD-10-CM | POA: Diagnosis not present

## 2023-09-28 DIAGNOSIS — M1990 Unspecified osteoarthritis, unspecified site: Secondary | ICD-10-CM | POA: Diagnosis not present

## 2023-09-28 DIAGNOSIS — M5030 Other cervical disc degeneration, unspecified cervical region: Secondary | ICD-10-CM | POA: Diagnosis not present

## 2023-09-28 DIAGNOSIS — E1140 Type 2 diabetes mellitus with diabetic neuropathy, unspecified: Secondary | ICD-10-CM | POA: Diagnosis not present

## 2023-09-28 DIAGNOSIS — N183 Chronic kidney disease, stage 3 unspecified: Secondary | ICD-10-CM | POA: Diagnosis not present

## 2023-09-28 DIAGNOSIS — D631 Anemia in chronic kidney disease: Secondary | ICD-10-CM | POA: Diagnosis not present

## 2023-09-28 DIAGNOSIS — L89623 Pressure ulcer of left heel, stage 3: Secondary | ICD-10-CM | POA: Diagnosis not present

## 2023-10-03 DIAGNOSIS — I129 Hypertensive chronic kidney disease with stage 1 through stage 4 chronic kidney disease, or unspecified chronic kidney disease: Secondary | ICD-10-CM | POA: Diagnosis not present

## 2023-10-03 DIAGNOSIS — I2510 Atherosclerotic heart disease of native coronary artery without angina pectoris: Secondary | ICD-10-CM | POA: Diagnosis not present

## 2023-10-03 DIAGNOSIS — Z7901 Long term (current) use of anticoagulants: Secondary | ICD-10-CM | POA: Diagnosis not present

## 2023-10-03 DIAGNOSIS — E785 Hyperlipidemia, unspecified: Secondary | ICD-10-CM | POA: Diagnosis not present

## 2023-10-03 DIAGNOSIS — E1140 Type 2 diabetes mellitus with diabetic neuropathy, unspecified: Secondary | ICD-10-CM | POA: Diagnosis not present

## 2023-10-03 DIAGNOSIS — E1122 Type 2 diabetes mellitus with diabetic chronic kidney disease: Secondary | ICD-10-CM | POA: Diagnosis not present

## 2023-10-03 DIAGNOSIS — I252 Old myocardial infarction: Secondary | ICD-10-CM | POA: Diagnosis not present

## 2023-10-03 DIAGNOSIS — L89623 Pressure ulcer of left heel, stage 3: Secondary | ICD-10-CM | POA: Diagnosis not present

## 2023-10-03 DIAGNOSIS — N182 Chronic kidney disease, stage 2 (mild): Secondary | ICD-10-CM | POA: Diagnosis not present

## 2023-10-05 DIAGNOSIS — E1122 Type 2 diabetes mellitus with diabetic chronic kidney disease: Secondary | ICD-10-CM | POA: Diagnosis not present

## 2023-10-05 DIAGNOSIS — E1140 Type 2 diabetes mellitus with diabetic neuropathy, unspecified: Secondary | ICD-10-CM | POA: Diagnosis not present

## 2023-10-05 DIAGNOSIS — I824Y2 Acute embolism and thrombosis of unspecified deep veins of left proximal lower extremity: Secondary | ICD-10-CM | POA: Diagnosis not present

## 2023-10-05 DIAGNOSIS — L89623 Pressure ulcer of left heel, stage 3: Secondary | ICD-10-CM | POA: Diagnosis not present

## 2023-10-05 DIAGNOSIS — D631 Anemia in chronic kidney disease: Secondary | ICD-10-CM | POA: Diagnosis not present

## 2023-10-05 DIAGNOSIS — M5030 Other cervical disc degeneration, unspecified cervical region: Secondary | ICD-10-CM | POA: Diagnosis not present

## 2023-10-05 DIAGNOSIS — I129 Hypertensive chronic kidney disease with stage 1 through stage 4 chronic kidney disease, or unspecified chronic kidney disease: Secondary | ICD-10-CM | POA: Diagnosis not present

## 2023-10-05 DIAGNOSIS — M1990 Unspecified osteoarthritis, unspecified site: Secondary | ICD-10-CM | POA: Diagnosis not present

## 2023-10-05 DIAGNOSIS — N183 Chronic kidney disease, stage 3 unspecified: Secondary | ICD-10-CM | POA: Diagnosis not present

## 2023-10-09 DIAGNOSIS — E1122 Type 2 diabetes mellitus with diabetic chronic kidney disease: Secondary | ICD-10-CM | POA: Diagnosis not present

## 2023-10-09 DIAGNOSIS — N183 Chronic kidney disease, stage 3 unspecified: Secondary | ICD-10-CM | POA: Diagnosis not present

## 2023-10-09 DIAGNOSIS — E1140 Type 2 diabetes mellitus with diabetic neuropathy, unspecified: Secondary | ICD-10-CM | POA: Diagnosis not present

## 2023-10-09 DIAGNOSIS — D631 Anemia in chronic kidney disease: Secondary | ICD-10-CM | POA: Diagnosis not present

## 2023-10-09 DIAGNOSIS — I129 Hypertensive chronic kidney disease with stage 1 through stage 4 chronic kidney disease, or unspecified chronic kidney disease: Secondary | ICD-10-CM | POA: Diagnosis not present

## 2023-10-09 DIAGNOSIS — M1990 Unspecified osteoarthritis, unspecified site: Secondary | ICD-10-CM | POA: Diagnosis not present

## 2023-10-09 DIAGNOSIS — M5030 Other cervical disc degeneration, unspecified cervical region: Secondary | ICD-10-CM | POA: Diagnosis not present

## 2023-10-09 DIAGNOSIS — I824Y2 Acute embolism and thrombosis of unspecified deep veins of left proximal lower extremity: Secondary | ICD-10-CM | POA: Diagnosis not present

## 2023-10-09 DIAGNOSIS — L89623 Pressure ulcer of left heel, stage 3: Secondary | ICD-10-CM | POA: Diagnosis not present

## 2023-10-17 DIAGNOSIS — L89623 Pressure ulcer of left heel, stage 3: Secondary | ICD-10-CM | POA: Diagnosis not present

## 2023-10-17 DIAGNOSIS — I824Y2 Acute embolism and thrombosis of unspecified deep veins of left proximal lower extremity: Secondary | ICD-10-CM | POA: Diagnosis not present

## 2023-10-19 DIAGNOSIS — M5030 Other cervical disc degeneration, unspecified cervical region: Secondary | ICD-10-CM | POA: Diagnosis not present

## 2023-10-19 DIAGNOSIS — I824Y2 Acute embolism and thrombosis of unspecified deep veins of left proximal lower extremity: Secondary | ICD-10-CM | POA: Diagnosis not present

## 2023-10-19 DIAGNOSIS — E1122 Type 2 diabetes mellitus with diabetic chronic kidney disease: Secondary | ICD-10-CM | POA: Diagnosis not present

## 2023-10-19 DIAGNOSIS — L89623 Pressure ulcer of left heel, stage 3: Secondary | ICD-10-CM | POA: Diagnosis not present

## 2023-10-19 DIAGNOSIS — M1990 Unspecified osteoarthritis, unspecified site: Secondary | ICD-10-CM | POA: Diagnosis not present

## 2023-10-19 DIAGNOSIS — E1140 Type 2 diabetes mellitus with diabetic neuropathy, unspecified: Secondary | ICD-10-CM | POA: Diagnosis not present

## 2023-10-19 DIAGNOSIS — I129 Hypertensive chronic kidney disease with stage 1 through stage 4 chronic kidney disease, or unspecified chronic kidney disease: Secondary | ICD-10-CM | POA: Diagnosis not present

## 2023-10-19 DIAGNOSIS — N183 Chronic kidney disease, stage 3 unspecified: Secondary | ICD-10-CM | POA: Diagnosis not present

## 2023-10-19 DIAGNOSIS — D631 Anemia in chronic kidney disease: Secondary | ICD-10-CM | POA: Diagnosis not present

## 2023-10-23 DIAGNOSIS — D631 Anemia in chronic kidney disease: Secondary | ICD-10-CM | POA: Diagnosis not present

## 2023-10-23 DIAGNOSIS — E1122 Type 2 diabetes mellitus with diabetic chronic kidney disease: Secondary | ICD-10-CM | POA: Diagnosis not present

## 2023-10-23 DIAGNOSIS — M5030 Other cervical disc degeneration, unspecified cervical region: Secondary | ICD-10-CM | POA: Diagnosis not present

## 2023-10-23 DIAGNOSIS — I129 Hypertensive chronic kidney disease with stage 1 through stage 4 chronic kidney disease, or unspecified chronic kidney disease: Secondary | ICD-10-CM | POA: Diagnosis not present

## 2023-10-23 DIAGNOSIS — M1990 Unspecified osteoarthritis, unspecified site: Secondary | ICD-10-CM | POA: Diagnosis not present

## 2023-10-23 DIAGNOSIS — N183 Chronic kidney disease, stage 3 unspecified: Secondary | ICD-10-CM | POA: Diagnosis not present

## 2023-10-23 DIAGNOSIS — L89623 Pressure ulcer of left heel, stage 3: Secondary | ICD-10-CM | POA: Diagnosis not present

## 2023-10-23 DIAGNOSIS — I824Y2 Acute embolism and thrombosis of unspecified deep veins of left proximal lower extremity: Secondary | ICD-10-CM | POA: Diagnosis not present

## 2023-10-23 DIAGNOSIS — E1140 Type 2 diabetes mellitus with diabetic neuropathy, unspecified: Secondary | ICD-10-CM | POA: Diagnosis not present

## 2023-10-25 DIAGNOSIS — I824Y2 Acute embolism and thrombosis of unspecified deep veins of left proximal lower extremity: Secondary | ICD-10-CM | POA: Diagnosis not present

## 2023-10-25 DIAGNOSIS — E1140 Type 2 diabetes mellitus with diabetic neuropathy, unspecified: Secondary | ICD-10-CM | POA: Diagnosis not present

## 2023-10-25 DIAGNOSIS — I129 Hypertensive chronic kidney disease with stage 1 through stage 4 chronic kidney disease, or unspecified chronic kidney disease: Secondary | ICD-10-CM | POA: Diagnosis not present

## 2023-10-25 DIAGNOSIS — L89623 Pressure ulcer of left heel, stage 3: Secondary | ICD-10-CM | POA: Diagnosis not present

## 2023-10-25 DIAGNOSIS — M5030 Other cervical disc degeneration, unspecified cervical region: Secondary | ICD-10-CM | POA: Diagnosis not present

## 2023-10-25 DIAGNOSIS — N183 Chronic kidney disease, stage 3 unspecified: Secondary | ICD-10-CM | POA: Diagnosis not present

## 2023-10-25 DIAGNOSIS — D631 Anemia in chronic kidney disease: Secondary | ICD-10-CM | POA: Diagnosis not present

## 2023-10-25 DIAGNOSIS — E1122 Type 2 diabetes mellitus with diabetic chronic kidney disease: Secondary | ICD-10-CM | POA: Diagnosis not present

## 2023-10-25 DIAGNOSIS — M1990 Unspecified osteoarthritis, unspecified site: Secondary | ICD-10-CM | POA: Diagnosis not present

## 2023-10-31 DIAGNOSIS — I824Y2 Acute embolism and thrombosis of unspecified deep veins of left proximal lower extremity: Secondary | ICD-10-CM | POA: Diagnosis not present

## 2023-10-31 DIAGNOSIS — L89623 Pressure ulcer of left heel, stage 3: Secondary | ICD-10-CM | POA: Diagnosis not present

## 2023-11-02 DIAGNOSIS — D631 Anemia in chronic kidney disease: Secondary | ICD-10-CM | POA: Diagnosis not present

## 2023-11-02 DIAGNOSIS — E1122 Type 2 diabetes mellitus with diabetic chronic kidney disease: Secondary | ICD-10-CM | POA: Diagnosis not present

## 2023-11-02 DIAGNOSIS — M5030 Other cervical disc degeneration, unspecified cervical region: Secondary | ICD-10-CM | POA: Diagnosis not present

## 2023-11-02 DIAGNOSIS — I129 Hypertensive chronic kidney disease with stage 1 through stage 4 chronic kidney disease, or unspecified chronic kidney disease: Secondary | ICD-10-CM | POA: Diagnosis not present

## 2023-11-02 DIAGNOSIS — N183 Chronic kidney disease, stage 3 unspecified: Secondary | ICD-10-CM | POA: Diagnosis not present

## 2023-11-02 DIAGNOSIS — M1990 Unspecified osteoarthritis, unspecified site: Secondary | ICD-10-CM | POA: Diagnosis not present

## 2023-11-02 DIAGNOSIS — I824Y2 Acute embolism and thrombosis of unspecified deep veins of left proximal lower extremity: Secondary | ICD-10-CM | POA: Diagnosis not present

## 2023-11-02 DIAGNOSIS — L89623 Pressure ulcer of left heel, stage 3: Secondary | ICD-10-CM | POA: Diagnosis not present

## 2023-11-02 DIAGNOSIS — E1140 Type 2 diabetes mellitus with diabetic neuropathy, unspecified: Secondary | ICD-10-CM | POA: Diagnosis not present

## 2023-11-09 DIAGNOSIS — N183 Chronic kidney disease, stage 3 unspecified: Secondary | ICD-10-CM | POA: Diagnosis not present

## 2023-11-09 DIAGNOSIS — M1990 Unspecified osteoarthritis, unspecified site: Secondary | ICD-10-CM | POA: Diagnosis not present

## 2023-11-09 DIAGNOSIS — M5030 Other cervical disc degeneration, unspecified cervical region: Secondary | ICD-10-CM | POA: Diagnosis not present

## 2023-11-09 DIAGNOSIS — I129 Hypertensive chronic kidney disease with stage 1 through stage 4 chronic kidney disease, or unspecified chronic kidney disease: Secondary | ICD-10-CM | POA: Diagnosis not present

## 2023-11-09 DIAGNOSIS — E1140 Type 2 diabetes mellitus with diabetic neuropathy, unspecified: Secondary | ICD-10-CM | POA: Diagnosis not present

## 2023-11-09 DIAGNOSIS — I824Y2 Acute embolism and thrombosis of unspecified deep veins of left proximal lower extremity: Secondary | ICD-10-CM | POA: Diagnosis not present

## 2023-11-09 DIAGNOSIS — L89623 Pressure ulcer of left heel, stage 3: Secondary | ICD-10-CM | POA: Diagnosis not present

## 2023-11-09 DIAGNOSIS — D631 Anemia in chronic kidney disease: Secondary | ICD-10-CM | POA: Diagnosis not present

## 2023-11-09 DIAGNOSIS — E1122 Type 2 diabetes mellitus with diabetic chronic kidney disease: Secondary | ICD-10-CM | POA: Diagnosis not present

## 2023-11-14 DIAGNOSIS — E1122 Type 2 diabetes mellitus with diabetic chronic kidney disease: Secondary | ICD-10-CM | POA: Diagnosis not present

## 2023-11-14 DIAGNOSIS — E1140 Type 2 diabetes mellitus with diabetic neuropathy, unspecified: Secondary | ICD-10-CM | POA: Diagnosis not present

## 2023-11-14 DIAGNOSIS — Z90710 Acquired absence of both cervix and uterus: Secondary | ICD-10-CM | POA: Diagnosis not present

## 2023-11-14 DIAGNOSIS — I824Y2 Acute embolism and thrombosis of unspecified deep veins of left proximal lower extremity: Secondary | ICD-10-CM | POA: Diagnosis not present

## 2023-11-14 DIAGNOSIS — N182 Chronic kidney disease, stage 2 (mild): Secondary | ICD-10-CM | POA: Diagnosis not present

## 2023-11-14 DIAGNOSIS — L89623 Pressure ulcer of left heel, stage 3: Secondary | ICD-10-CM | POA: Diagnosis not present

## 2023-11-14 DIAGNOSIS — E785 Hyperlipidemia, unspecified: Secondary | ICD-10-CM | POA: Diagnosis not present

## 2023-11-14 DIAGNOSIS — Z7901 Long term (current) use of anticoagulants: Secondary | ICD-10-CM | POA: Diagnosis not present

## 2023-11-14 DIAGNOSIS — I129 Hypertensive chronic kidney disease with stage 1 through stage 4 chronic kidney disease, or unspecified chronic kidney disease: Secondary | ICD-10-CM | POA: Diagnosis not present

## 2023-11-16 DIAGNOSIS — E1140 Type 2 diabetes mellitus with diabetic neuropathy, unspecified: Secondary | ICD-10-CM | POA: Diagnosis not present

## 2023-11-16 DIAGNOSIS — N183 Chronic kidney disease, stage 3 unspecified: Secondary | ICD-10-CM | POA: Diagnosis not present

## 2023-11-16 DIAGNOSIS — D631 Anemia in chronic kidney disease: Secondary | ICD-10-CM | POA: Diagnosis not present

## 2023-11-16 DIAGNOSIS — M1990 Unspecified osteoarthritis, unspecified site: Secondary | ICD-10-CM | POA: Diagnosis not present

## 2023-11-16 DIAGNOSIS — L89623 Pressure ulcer of left heel, stage 3: Secondary | ICD-10-CM | POA: Diagnosis not present

## 2023-11-16 DIAGNOSIS — I129 Hypertensive chronic kidney disease with stage 1 through stage 4 chronic kidney disease, or unspecified chronic kidney disease: Secondary | ICD-10-CM | POA: Diagnosis not present

## 2023-11-16 DIAGNOSIS — I824Y2 Acute embolism and thrombosis of unspecified deep veins of left proximal lower extremity: Secondary | ICD-10-CM | POA: Diagnosis not present

## 2023-11-16 DIAGNOSIS — E1122 Type 2 diabetes mellitus with diabetic chronic kidney disease: Secondary | ICD-10-CM | POA: Diagnosis not present

## 2023-11-16 DIAGNOSIS — M5030 Other cervical disc degeneration, unspecified cervical region: Secondary | ICD-10-CM | POA: Diagnosis not present

## 2023-11-20 DIAGNOSIS — E1122 Type 2 diabetes mellitus with diabetic chronic kidney disease: Secondary | ICD-10-CM | POA: Diagnosis not present

## 2023-11-20 DIAGNOSIS — N183 Chronic kidney disease, stage 3 unspecified: Secondary | ICD-10-CM | POA: Diagnosis not present

## 2023-11-20 DIAGNOSIS — L89623 Pressure ulcer of left heel, stage 3: Secondary | ICD-10-CM | POA: Diagnosis not present

## 2023-11-20 DIAGNOSIS — I824Y2 Acute embolism and thrombosis of unspecified deep veins of left proximal lower extremity: Secondary | ICD-10-CM | POA: Diagnosis not present

## 2023-11-20 DIAGNOSIS — I129 Hypertensive chronic kidney disease with stage 1 through stage 4 chronic kidney disease, or unspecified chronic kidney disease: Secondary | ICD-10-CM | POA: Diagnosis not present

## 2023-11-20 DIAGNOSIS — E1140 Type 2 diabetes mellitus with diabetic neuropathy, unspecified: Secondary | ICD-10-CM | POA: Diagnosis not present

## 2023-11-20 DIAGNOSIS — M5030 Other cervical disc degeneration, unspecified cervical region: Secondary | ICD-10-CM | POA: Diagnosis not present

## 2023-11-20 DIAGNOSIS — M1990 Unspecified osteoarthritis, unspecified site: Secondary | ICD-10-CM | POA: Diagnosis not present

## 2023-11-20 DIAGNOSIS — D631 Anemia in chronic kidney disease: Secondary | ICD-10-CM | POA: Diagnosis not present

## 2023-11-21 DIAGNOSIS — E1140 Type 2 diabetes mellitus with diabetic neuropathy, unspecified: Secondary | ICD-10-CM | POA: Diagnosis not present

## 2023-11-21 DIAGNOSIS — N182 Chronic kidney disease, stage 2 (mild): Secondary | ICD-10-CM | POA: Diagnosis not present

## 2023-11-21 DIAGNOSIS — L89623 Pressure ulcer of left heel, stage 3: Secondary | ICD-10-CM | POA: Diagnosis not present

## 2023-11-21 DIAGNOSIS — Z7901 Long term (current) use of anticoagulants: Secondary | ICD-10-CM | POA: Diagnosis not present

## 2023-11-21 DIAGNOSIS — E785 Hyperlipidemia, unspecified: Secondary | ICD-10-CM | POA: Diagnosis not present

## 2023-11-21 DIAGNOSIS — E1122 Type 2 diabetes mellitus with diabetic chronic kidney disease: Secondary | ICD-10-CM | POA: Diagnosis not present

## 2023-11-21 DIAGNOSIS — I129 Hypertensive chronic kidney disease with stage 1 through stage 4 chronic kidney disease, or unspecified chronic kidney disease: Secondary | ICD-10-CM | POA: Diagnosis not present

## 2023-11-21 DIAGNOSIS — Z90710 Acquired absence of both cervix and uterus: Secondary | ICD-10-CM | POA: Diagnosis not present

## 2023-11-21 DIAGNOSIS — I824Y2 Acute embolism and thrombosis of unspecified deep veins of left proximal lower extremity: Secondary | ICD-10-CM | POA: Diagnosis not present

## 2023-11-24 DIAGNOSIS — E1122 Type 2 diabetes mellitus with diabetic chronic kidney disease: Secondary | ICD-10-CM | POA: Diagnosis not present

## 2023-11-24 DIAGNOSIS — L89623 Pressure ulcer of left heel, stage 3: Secondary | ICD-10-CM | POA: Diagnosis not present

## 2023-11-24 DIAGNOSIS — M5030 Other cervical disc degeneration, unspecified cervical region: Secondary | ICD-10-CM | POA: Diagnosis not present

## 2023-11-24 DIAGNOSIS — E1140 Type 2 diabetes mellitus with diabetic neuropathy, unspecified: Secondary | ICD-10-CM | POA: Diagnosis not present

## 2023-11-24 DIAGNOSIS — M1990 Unspecified osteoarthritis, unspecified site: Secondary | ICD-10-CM | POA: Diagnosis not present

## 2023-11-24 DIAGNOSIS — D631 Anemia in chronic kidney disease: Secondary | ICD-10-CM | POA: Diagnosis not present

## 2023-11-24 DIAGNOSIS — I129 Hypertensive chronic kidney disease with stage 1 through stage 4 chronic kidney disease, or unspecified chronic kidney disease: Secondary | ICD-10-CM | POA: Diagnosis not present

## 2023-11-24 DIAGNOSIS — I824Y2 Acute embolism and thrombosis of unspecified deep veins of left proximal lower extremity: Secondary | ICD-10-CM | POA: Diagnosis not present

## 2023-11-24 DIAGNOSIS — N183 Chronic kidney disease, stage 3 unspecified: Secondary | ICD-10-CM | POA: Diagnosis not present

## 2023-11-29 DIAGNOSIS — E1140 Type 2 diabetes mellitus with diabetic neuropathy, unspecified: Secondary | ICD-10-CM | POA: Diagnosis not present

## 2023-11-29 DIAGNOSIS — N183 Chronic kidney disease, stage 3 unspecified: Secondary | ICD-10-CM | POA: Diagnosis not present

## 2023-11-29 DIAGNOSIS — I129 Hypertensive chronic kidney disease with stage 1 through stage 4 chronic kidney disease, or unspecified chronic kidney disease: Secondary | ICD-10-CM | POA: Diagnosis not present

## 2023-11-29 DIAGNOSIS — M1990 Unspecified osteoarthritis, unspecified site: Secondary | ICD-10-CM | POA: Diagnosis not present

## 2023-11-29 DIAGNOSIS — D631 Anemia in chronic kidney disease: Secondary | ICD-10-CM | POA: Diagnosis not present

## 2023-11-29 DIAGNOSIS — I824Y2 Acute embolism and thrombosis of unspecified deep veins of left proximal lower extremity: Secondary | ICD-10-CM | POA: Diagnosis not present

## 2023-11-29 DIAGNOSIS — L89623 Pressure ulcer of left heel, stage 3: Secondary | ICD-10-CM | POA: Diagnosis not present

## 2023-11-29 DIAGNOSIS — E1122 Type 2 diabetes mellitus with diabetic chronic kidney disease: Secondary | ICD-10-CM | POA: Diagnosis not present

## 2023-11-29 DIAGNOSIS — M5030 Other cervical disc degeneration, unspecified cervical region: Secondary | ICD-10-CM | POA: Diagnosis not present

## 2023-12-04 DIAGNOSIS — M5030 Other cervical disc degeneration, unspecified cervical region: Secondary | ICD-10-CM | POA: Diagnosis not present

## 2023-12-04 DIAGNOSIS — E1140 Type 2 diabetes mellitus with diabetic neuropathy, unspecified: Secondary | ICD-10-CM | POA: Diagnosis not present

## 2023-12-04 DIAGNOSIS — I129 Hypertensive chronic kidney disease with stage 1 through stage 4 chronic kidney disease, or unspecified chronic kidney disease: Secondary | ICD-10-CM | POA: Diagnosis not present

## 2023-12-04 DIAGNOSIS — N183 Chronic kidney disease, stage 3 unspecified: Secondary | ICD-10-CM | POA: Diagnosis not present

## 2023-12-04 DIAGNOSIS — E1122 Type 2 diabetes mellitus with diabetic chronic kidney disease: Secondary | ICD-10-CM | POA: Diagnosis not present

## 2023-12-04 DIAGNOSIS — I824Y2 Acute embolism and thrombosis of unspecified deep veins of left proximal lower extremity: Secondary | ICD-10-CM | POA: Diagnosis not present

## 2023-12-04 DIAGNOSIS — M1990 Unspecified osteoarthritis, unspecified site: Secondary | ICD-10-CM | POA: Diagnosis not present

## 2023-12-04 DIAGNOSIS — L89623 Pressure ulcer of left heel, stage 3: Secondary | ICD-10-CM | POA: Diagnosis not present

## 2023-12-04 DIAGNOSIS — D631 Anemia in chronic kidney disease: Secondary | ICD-10-CM | POA: Diagnosis not present

## 2023-12-05 DIAGNOSIS — D529 Folate deficiency anemia, unspecified: Secondary | ICD-10-CM | POA: Diagnosis not present

## 2023-12-05 DIAGNOSIS — R7301 Impaired fasting glucose: Secondary | ICD-10-CM | POA: Diagnosis not present

## 2023-12-05 DIAGNOSIS — D649 Anemia, unspecified: Secondary | ICD-10-CM | POA: Diagnosis not present

## 2023-12-05 DIAGNOSIS — E7849 Other hyperlipidemia: Secondary | ICD-10-CM | POA: Diagnosis not present

## 2023-12-11 ENCOUNTER — Other Ambulatory Visit (HOSPITAL_COMMUNITY): Payer: Self-pay | Admitting: Family Medicine

## 2023-12-11 DIAGNOSIS — N1832 Chronic kidney disease, stage 3b: Secondary | ICD-10-CM | POA: Diagnosis not present

## 2023-12-11 DIAGNOSIS — I82402 Acute embolism and thrombosis of unspecified deep veins of left lower extremity: Secondary | ICD-10-CM

## 2023-12-11 DIAGNOSIS — E1140 Type 2 diabetes mellitus with diabetic neuropathy, unspecified: Secondary | ICD-10-CM | POA: Diagnosis not present

## 2023-12-11 DIAGNOSIS — E782 Mixed hyperlipidemia: Secondary | ICD-10-CM | POA: Diagnosis not present

## 2023-12-11 DIAGNOSIS — Z6833 Body mass index (BMI) 33.0-33.9, adult: Secondary | ICD-10-CM | POA: Diagnosis not present

## 2023-12-12 DIAGNOSIS — E1140 Type 2 diabetes mellitus with diabetic neuropathy, unspecified: Secondary | ICD-10-CM | POA: Diagnosis not present

## 2023-12-12 DIAGNOSIS — E785 Hyperlipidemia, unspecified: Secondary | ICD-10-CM | POA: Diagnosis not present

## 2023-12-12 DIAGNOSIS — Z79899 Other long term (current) drug therapy: Secondary | ICD-10-CM | POA: Diagnosis not present

## 2023-12-12 DIAGNOSIS — E1122 Type 2 diabetes mellitus with diabetic chronic kidney disease: Secondary | ICD-10-CM | POA: Diagnosis not present

## 2023-12-12 DIAGNOSIS — I129 Hypertensive chronic kidney disease with stage 1 through stage 4 chronic kidney disease, or unspecified chronic kidney disease: Secondary | ICD-10-CM | POA: Diagnosis not present

## 2023-12-12 DIAGNOSIS — Z7901 Long term (current) use of anticoagulants: Secondary | ICD-10-CM | POA: Diagnosis not present

## 2023-12-12 DIAGNOSIS — N182 Chronic kidney disease, stage 2 (mild): Secondary | ICD-10-CM | POA: Diagnosis not present

## 2023-12-12 DIAGNOSIS — L89623 Pressure ulcer of left heel, stage 3: Secondary | ICD-10-CM | POA: Diagnosis not present

## 2023-12-12 DIAGNOSIS — I2510 Atherosclerotic heart disease of native coronary artery without angina pectoris: Secondary | ICD-10-CM | POA: Diagnosis not present

## 2023-12-13 ENCOUNTER — Ambulatory Visit (HOSPITAL_COMMUNITY)
Admission: RE | Admit: 2023-12-13 | Discharge: 2023-12-13 | Disposition: A | Discharge status: Home / Self Care | Source: Ambulatory Visit | Attending: Family Medicine | Admitting: Family Medicine

## 2023-12-13 DIAGNOSIS — I82412 Acute embolism and thrombosis of left femoral vein: Secondary | ICD-10-CM | POA: Diagnosis not present

## 2023-12-13 DIAGNOSIS — I82402 Acute embolism and thrombosis of unspecified deep veins of left lower extremity: Secondary | ICD-10-CM | POA: Diagnosis not present

## 2023-12-14 DIAGNOSIS — I824Y2 Acute embolism and thrombosis of unspecified deep veins of left proximal lower extremity: Secondary | ICD-10-CM | POA: Diagnosis not present

## 2023-12-14 DIAGNOSIS — M1990 Unspecified osteoarthritis, unspecified site: Secondary | ICD-10-CM | POA: Diagnosis not present

## 2023-12-14 DIAGNOSIS — E1140 Type 2 diabetes mellitus with diabetic neuropathy, unspecified: Secondary | ICD-10-CM | POA: Diagnosis not present

## 2023-12-14 DIAGNOSIS — M5030 Other cervical disc degeneration, unspecified cervical region: Secondary | ICD-10-CM | POA: Diagnosis not present

## 2023-12-14 DIAGNOSIS — N183 Chronic kidney disease, stage 3 unspecified: Secondary | ICD-10-CM | POA: Diagnosis not present

## 2023-12-14 DIAGNOSIS — D631 Anemia in chronic kidney disease: Secondary | ICD-10-CM | POA: Diagnosis not present

## 2023-12-14 DIAGNOSIS — E1122 Type 2 diabetes mellitus with diabetic chronic kidney disease: Secondary | ICD-10-CM | POA: Diagnosis not present

## 2023-12-14 DIAGNOSIS — I129 Hypertensive chronic kidney disease with stage 1 through stage 4 chronic kidney disease, or unspecified chronic kidney disease: Secondary | ICD-10-CM | POA: Diagnosis not present

## 2023-12-14 DIAGNOSIS — L89623 Pressure ulcer of left heel, stage 3: Secondary | ICD-10-CM | POA: Diagnosis not present

## 2023-12-19 ENCOUNTER — Inpatient Hospital Stay

## 2023-12-19 ENCOUNTER — Inpatient Hospital Stay: Attending: Hematology | Admitting: Hematology

## 2023-12-19 VITALS — BP 193/81 | HR 61 | Temp 98.4°F | Resp 16 | Ht 67.0 in | Wt 209.2 lb

## 2023-12-19 DIAGNOSIS — Z7901 Long term (current) use of anticoagulants: Secondary | ICD-10-CM | POA: Diagnosis not present

## 2023-12-19 DIAGNOSIS — I82412 Acute embolism and thrombosis of left femoral vein: Secondary | ICD-10-CM | POA: Insufficient documentation

## 2023-12-19 DIAGNOSIS — I82502 Chronic embolism and thrombosis of unspecified deep veins of left lower extremity: Secondary | ICD-10-CM

## 2023-12-19 NOTE — Progress Notes (Signed)
 [facility] [address], [place] [address]   Clinic Day:  12/19/2023  Referring physician: Lari Elspeth BRAVO, MD  Patient Care Team: Lari Elspeth BRAVO, MD as PCP - General   ASSESSMENT & PLAN:   Assessment:  1.  Provoked left leg DVT: - Patient seen at the request of Dr. Lari. - Had left knee replacement on 02/13/2023, and was at the rehab center in [place] with decreased mobility. - 02/2023: Diagnosed with left leg DVT.  I do not have the original scan report to review. - She was initially started on Eliquis, which was changed to Coumadin due to cost. - Doppler on 06/21/2023: Age-indeterminate DVT involving left femoral vein, left popliteal vein. - Doppler on 09/17/2023: Findings consistent with chronic DVT involving left femoral vein. - Doppler on 12/13/2023: Small area of nonocclusive thrombus along the medial femoral vein. - She continues to be on warfarin 4 mg daily. - She had colonoscopy in November 2020, 1 hyperplastic polyp removed.  She is having mammograms every 2 years and is scheduled to have it soon at Dr. Cindi office. - No B symptoms.  No miscarriages.  2.  Social/family history: - She lives at home by itself and walks with the help of walker and cane.  Son lives about 10 minutes away and checks on her daily.  She is non-smoker. - No family history of thrombosis.  Sister had cancer, type unknown to the patient.  Brother died of cancer.  Plan:  1.  Provoked left leg DVT: - I have reviewed several of her imaging studies which shows improving thrombus. - I have also discussed with radiologist Dr. Charlanne who has read her most recent ultrasound from 12/13/2023. - I think the clot is dissolving appropriately but rather slowly due to patient's immobility.  I have recommended that we continue Coumadin.  I will see her back in 3 months with repeat left leg Doppler and D-dimer.  If the clot is resolved at that time, and she is active on her feet, we may  discontinue anticoagulation. - I have also phoned and discussed with her son.   Orders Placed This Encounter  Procedures   US  Venous Img Lower Unilateral Left    Standing Status:   Future    Expected Date:   03/20/2024    Expiration Date:   06/18/2024    Reason for Exam (SYMPTOM  OR DIAGNOSIS REQUIRED):   DVT of left leg    Preferred imaging location?:   [facility]    Release to patient:   Immediate   D-dimer, quantitative    Standing Status:   Future    Expected Date:   03/20/2024    Expiration Date:   06/18/2024    Release to patient:   Immediate [1]      I,Helena R Teague,acting as a scribe for Alean Stands, MD.,have documented all relevant documentation on the behalf of Alean Stands, MD,as directed by  Alean Stands, MD while in the presence of Alean Stands, MD.   I, Alean Stands MD, have reviewed the above documentation for accuracy and completeness, and I agree with the above.   Alean Stands, MD   6/25/20254:07 PM  CHIEF COMPLAINT/PURPOSE OF CONSULT:   Diagnosis: Provoked left leg DVT  Current Therapy: Coumadin  HISTORY OF PRESENT ILLNESS:   [name] [name] is a 73 y.o. female presenting to clinic today for evaluation of chronic DVT of left femoral vein at the request of Dr. Lari.  Patient has a medical history of partial hysterectomy in the 1990's, cholecystectomy, hypertension, DM, CAD, CKD, and neuropathy.   Tran was seen by her PCP at [facility] on 12/11/23 for follow-up of wound on her left heel, treated by the wound care clinic. At this visit, her PCP referred her to me after a diagnosis of left femoral vein DVT in September 2024 s/p left knee replacement (02/13/23), and was started on Eliquis then transitioned to Coumadin. She took the blood thinner for 6 months and had a repeat DVT in March 2025 which came back positive for left femoral vein DVT and continued Coumadin.  Her most recent DVT study from 12/13/23 showed:  Small area of nonocclusive thrombus along the mid femoral vein. This area was not seen previously. As per history this could be the area of chronic DVT but with this not being present on the prior would recommend correlation to any other known history or prior imaging. Acute component in principle is not excluded in this area.   Today, she states that she is doing well overall. Her appetite level is at 50%. Her energy level is at 25%.  PAST MEDICAL HISTORY:   Past Medical History: Past Medical History:  Diagnosis Date   Arthritis    Carpal tunnel syndrome    Chronic kidney disease (CKD), stage II (mild)    Constipation    Coronary artery disease    NSTEMI 2004, DES to ostial RCA   DDD (degenerative disc disease)    Essential hypertension    GERD (gastroesophageal reflux disease)    Hyperlipidemia    IBS (irritable bowel syndrome)    Low back pain    Myocardial infarction (HCC) 2004   one stent placed   Neuropathy    bilateral hands r/t cervical spine issues   Pneumonia    Postmenopausal    Pulmonary nodule    Sleep apnea    waiting on machine to come.   Type 2 diabetes mellitus (HCC)    Vertigo     Surgical History: Past Surgical History:  Procedure Laterality Date   ABDOMINAL HYSTERECTOMY     partial. Pt was in her 31s.   ANTERIOR CERVICAL DECOMP/DISCECTOMY FUSION N/A 11/23/2022   Procedure: Anterior Cervical Decompression Fusion Cervical three-four, Cervical four-five;  Surgeon: Dawley, Lani BROCKS, DO;  Location: [facility] OR;  Service: Neurosurgery;  Laterality: N/A;   CARDIAC CATHETERIZATION N/A 04/28/2015   Procedure: Left Heart Cath and Coronary Angiography;  Surgeon: Ozell Fell, MD;  Location: [facility] INVASIVE CV LAB;  Service: Cardiovascular;  Laterality: N/A;   CATARACT EXTRACTION Bilateral    CATARACT EXTRACTION W/PHACO Right 07/19/2020   Procedure: CATARACT EXTRACTION PHACO AND INTRAOCULAR LENS PLACEMENT RIGHT EYE;  Surgeon: Harrie Agent, MD;  Location: [facility] ORS;   Service: Ophthalmology;  Laterality: Right;  right CDE=12.56   CATARACT EXTRACTION W/PHACO Left 08/09/2020   Procedure: CATARACT EXTRACTION PHACO AND INTRAOCULAR LENS PLACEMENT (IOC);  Surgeon: Harrie Agent, MD;  Location: [facility] ORS;  Service: Ophthalmology;  Laterality: Left;  CDE: 6.66   Cervical disectomy and fusion     Cholestectomy     COLONOSCOPY N/A 05/07/2019   Procedure: COLONOSCOPY;  Surgeon: Shaaron Lamar HERO, MD;  Location: [facility] ENDO SUITE;  Service: Endoscopy;  Laterality: N/A;  12:00   Excision of ovarian cyst     L4-5 with pedicle screws and rods     x2   MULTIPLE TOOTH EXTRACTIONS     Right shoulder RTC     TOTAL KNEE ARTHROPLASTY Right  12/16/2021   Procedure: RIGHT TOTAL KNEE ARTHROPLASTY;  Surgeon: Vernetta Lonni GRADE, MD;  Location: [facility] ORS;  Service: Orthopedics;  Laterality: Right;   TOTAL KNEE ARTHROPLASTY Left 02/13/2023   Procedure: LEFT TOTAL KNEE ARTHROPLASTY;  Surgeon: Vernetta Lonni GRADE, MD;  Location: [facility] ORS;  Service: Orthopedics;  Laterality: Left;    Social History: Social History   Socioeconomic History   Marital status: Widowed    Spouse name: Not on file   Number of children: Not on file   Years of education: Not on file   Highest education level: Not on file  Occupational History   Occupation: Psychiatric nurse, Government social research officer  Tobacco Use   Smoking status: Never   Smokeless tobacco: Never  Vaping Use   Vaping status: Never Used  Substance and Sexual Activity   Alcohol  use: No    Alcohol /week: 0.0 standard drinks of alcohol    Drug use: No   Sexual activity: Never  Other Topics Concern   Not on file  Social History Narrative   Married and lives with husband   Disabled due to lumbosacral spine disease   10th grade education      Social Drivers of Health   Financial Resource Strain: Low Risk  (12/19/2023)   Overall Financial Resource Strain (CARDIA)    Difficulty of Paying Living Expenses: Not very hard  Food Insecurity: No Food Insecurity  (12/19/2023)   Hunger Vital Sign    Worried About Running Out of Food in the Last Year: Never true    Ran Out of Food in the Last Year: Never true  Transportation Needs: No Transportation Needs (12/19/2023)   PRAPARE - Administrator, Civil Service (Medical): No    Lack of Transportation (Non-Medical): No  Physical Activity: Not on file  Stress: No Stress Concern Present (12/19/2023)   [employer] of Occupational Health - Occupational Stress Questionnaire    Feeling of Stress: Only a little  Social Connections: Moderately Integrated (12/19/2023)   Social Connection and Isolation Panel    Frequency of Communication with Friends and Family: More than three times a week    Frequency of Social Gatherings with Friends and Family: More than three times a week    Attends Religious Services: More than 4 times per year    Active Member of [employer] or Organizations: Yes    Attends Banker Meetings: Never    Marital Status: Widowed  Intimate Partner Violence: Not At Risk (12/19/2023)   Humiliation, Afraid, Rape, and Kick questionnaire    Fear of Current or Ex-Partner: No    Emotionally Abused: No    Physically Abused: No    Sexually Abused: No    Family History: Family History  Problem Relation Age of Onset   Stroke Mother    Diabetes Mellitus II Mother    Kidney disease Father    Hypertension Brother    Diabetes Mellitus II Brother    Diabetes Mellitus II Sister     Current Medications:  Current Outpatient Medications:    acetaminophen  (TYLENOL ) 500 MG tablet, Take 1,000 mg by mouth every 6 (six) hours as needed for moderate pain., Disp: , Rfl:    albuterol  (VENTOLIN  HFA) 108 (90 Base) MCG/ACT inhaler, Inhale 2 puffs into the lungs every 6 (six) hours as needed for wheezing or shortness of breath., Disp: , Rfl:    amLODipine  (NORVASC ) 5 MG tablet, Take 5 mg by mouth daily., Disp: , Rfl:    atorvastatin  (LIPITOR) 40 MG tablet,  Take 40 mg by mouth daily., Disp:  , Rfl:    carvedilol (COREG) 6.25 MG tablet, Take 6.25 mg by mouth daily., Disp: , Rfl:    cholecalciferol  (VITAMIN D3) 25 MCG (1000 UNIT) tablet, Take 1,000 Units by mouth daily., Disp: , Rfl:    gabapentin  (NEURONTIN ) 300 MG capsule, Take 300 mg by mouth at bedtime., Disp: , Rfl:    metoprolol  tartrate (LOPRESSOR ) 25 MG tablet, Take 25 mg by mouth 2 (two) times daily. Take for 14 days, Disp: , Rfl:    Polyethyl Glycol-Propyl Glycol (LUBRICATING EYE DROPS OP), Place 1 drop into both eyes daily as needed (dry eyes)., Disp: , Rfl:    warfarin (COUMADIN) 3 MG tablet, Take 3 mg by mouth daily., Disp: , Rfl:    metFORMIN  (GLUCOPHAGE ) 500 MG tablet, Take 500 mg by mouth daily. (Patient not taking: Reported on 12/19/2023), Disp: , Rfl:    Allergies: Allergies  Allergen Reactions   Baclofen     Upset stomach, excessive fatigue, loss of balance    Methocarbamol      Upset stomach, excessive fatigue, loss of balance     REVIEW OF SYSTEMS:   Review of Systems  Constitutional:  Positive for fatigue. Negative for chills and fever.  HENT:   Negative for lump/mass, mouth sores, nosebleeds, sore throat and trouble swallowing.   Eyes:  Negative for eye problems.  Respiratory:  Negative for cough and shortness of breath.   Cardiovascular:  Negative for chest pain, leg swelling and palpitations.  Gastrointestinal:  Positive for constipation. Negative for abdominal pain, diarrhea, nausea and vomiting.  Genitourinary:  Negative for bladder incontinence, difficulty urinating, dysuria, frequency, hematuria and nocturia.   Musculoskeletal:  Negative for arthralgias, back pain, flank pain, myalgias and neck pain.  Skin:  Negative for itching and rash.  Neurological:  Positive for numbness. Negative for dizziness and headaches.  Hematological:  Does not bruise/bleed easily.  Psychiatric/Behavioral:  Positive for sleep disturbance. Negative for depression and suicidal ideas. The patient is not nervous/anxious.    All other systems reviewed and are negative.    VITALS:   Blood pressure (!) 193/81, pulse 61, temperature 98.4 F (36.9 C), temperature source Oral, resp. rate 16, height 5' 7 (1.702 m), weight 209 lb 3.5 oz (94.9 kg), SpO2 97%.  Wt Readings from Last 3 Encounters:  12/19/23 209 lb 3.5 oz (94.9 kg)  02/20/23 220 lb (99.8 kg)  02/13/23 220 lb (99.8 kg)    Body mass index is 32.77 kg/m.   PHYSICAL EXAM:   Physical Exam Vitals and nursing note reviewed. Exam conducted with a chaperone present.  Constitutional:      Appearance: Normal appearance.   Cardiovascular:     Rate and Rhythm: Normal rate and regular rhythm.     Pulses: Normal pulses.     Heart sounds: Normal heart sounds.  Pulmonary:     Effort: Pulmonary effort is normal.     Breath sounds: Normal breath sounds.  Abdominal:     Palpations: Abdomen is soft. There is no hepatomegaly, splenomegaly or mass.     Tenderness: There is no abdominal tenderness.   Musculoskeletal:     Right lower leg: No edema.     Left lower leg: No edema.  Lymphadenopathy:     Cervical: No cervical adenopathy.     Right cervical: No superficial, deep or posterior cervical adenopathy.    Left cervical: No superficial, deep or posterior cervical adenopathy.     Upper Body:  Right upper body: No supraclavicular or axillary adenopathy.     Left upper body: No supraclavicular or axillary adenopathy.   Neurological:     General: No focal deficit present.     Mental Status: She is alert and oriented to person, place, and time.   Psychiatric:        Mood and Affect: Mood normal.        Behavior: Behavior normal.     LABS:   CBC    Component Value Date/Time   WBC 7.8 02/20/2023 1256   RBC 3.29 (L) 02/20/2023 1256   HGB 9.9 (L) 02/20/2023 1256   HCT 31.2 (L) 02/20/2023 1256   PLT 291 02/20/2023 1256   MCV 94.8 02/20/2023 1256   MCH 30.1 02/20/2023 1256   MCHC 31.7 02/20/2023 1256   RDW 13.4 02/20/2023 1256   LYMPHSABS  1.0 02/20/2023 1256   MONOABS 0.5 02/20/2023 1256   EOSABS 0.2 02/20/2023 1256   BASOSABS 0.1 02/20/2023 1256    CMP    Component Value Date/Time   NA 136 02/20/2023 1256   K 3.8 02/20/2023 1256   CL 99 02/20/2023 1256   CO2 29 02/20/2023 1256   GLUCOSE 118 (H) 02/20/2023 1256   BUN 30 (H) 02/20/2023 1256   CREATININE 1.09 (H) 02/20/2023 1256   CALCIUM  9.0 02/20/2023 1256   PROT 7.2 02/20/2023 1256   ALBUMIN  2.8 (L) 02/20/2023 1256   AST 100 (H) 02/20/2023 1256   ALT 56 (H) 02/20/2023 1256   ALKPHOS 88 02/20/2023 1256   BILITOT 1.7 (H) 02/20/2023 1256   GFRNONAA 54 (L) 02/20/2023 1256   GFRAA 57 (L) 04/28/2015 0807    No results found for: CEA1, CEA / No results found for: CEA1, CEA No results found for: PSA1 No results found for: CAN199 No results found for: CAN125  No results found for: TOTALPROTELP, ALBUMINELP, A1GS, A2GS, BETS, BETA2SER, GAMS, MSPIKE, SPEI No results found for: TIBC, FERRITIN, IRONPCTSAT No results found for: LDH   STUDIES:   US  Venous Img Lower Unilateral Left (DVT) Result Date: 12/13/2023 CLINICAL DATA:  Follow-up chronic DVT. EXAM: Left LOWER EXTREMITY VENOUS DOPPLER ULTRASOUND TECHNIQUE: Gray-scale sonography with graded compression, as well as color Doppler and duplex ultrasound were performed to evaluate the lower extremity deep venous systems from the level of the common femoral vein and including the common femoral, femoral, profunda femoral, popliteal and calf veins including the posterior tibial, peroneal and gastrocnemius veins when visible. The superficial great saphenous vein was also interrogated. Spectral Doppler was utilized to evaluate flow at rest and with distal augmentation maneuvers in the common femoral, femoral and popliteal veins. COMPARISON:  Ultrasound 02/20/2023. FINDINGS: Contralateral Common Femoral Vein: Respiratory phasicity is normal and symmetric with the symptomatic side. No evidence  of thrombus. Normal compressibility. Common Femoral Vein: No evidence of thrombus. Normal compressibility, respiratory phasicity and response to augmentation. Saphenofemoral Junction: No evidence of thrombus. Normal compressibility and flow on color Doppler imaging. Profunda Femoral Vein: No evidence of thrombus. Normal compressibility and flow on color Doppler imaging. Femoral Vein: There is an area of decreased flow along the mid femoral vein with some echogenic material. There is residual flow on color and spectral Doppler. Popliteal Vein: No evidence of thrombus. Normal compressibility, respiratory phasicity and response to augmentation. Calf Veins: No evidence of thrombus. Normal compressibility and flow on color Doppler imaging. Superficial Great Saphenous Vein: No evidence of thrombus. Normal compressibility. Venous Reflux:  None. Other Findings:  None. IMPRESSION: Small area of  nonocclusive thrombus along the mid femoral vein. This area was not seen previously. As per history this could be the area of chronic DVT but with this not being present on the prior would recommend correlation to any other known history or prior imaging. Acute component in principle is not excluded in this an area. Electronically Signed   By: Ranell Bring M.D.   On: 12/13/2023 13:35

## 2023-12-19 NOTE — Patient Instructions (Addendum)
 [facility]  Discharge Instructions  You were seen and examined today by Dr. Rogers. He is a Marine scientist meaning that he specializes in the treatment of blood disorders and cancers. He discussed your medical history and the events that led up to you being here today.    Dr. Rogers discussed your past history of blood clot. Continue taking the Coumadin as prescribed.    Follow up as scheduled.   Thank you for choosing [facility] to provide your oncology and hematology care.   To afford each patient quality time with our provider, please arrive at least 15 minutes before your scheduled appointment time. You may need to reschedule your appointment if you arrive late (10 or more minutes). Arriving late affects you and other patients whose appointments are after yours.  Also, if you miss three or more appointments without notifying the office, you may be dismissed from the clinic at the provider's discretion.    Again, thank you for choosing [facility].  Our hope is that these requests will decrease the amount of time that you wait before being seen by our physicians.   If you have a lab appointment with the Cancer Center - please note that after April 8th, all labs will be drawn in the cancer center.  You do not have to check in or register with the main entrance as you have in the past but will complete your check-in at the cancer center.            _____________________________________________________________  Should you have questions after your visit to [facility], please contact our office at [phone] and follow the prompts.  Our office hours are 8:00 a.m. to 4:30 p.m. Monday - Thursday and 8:00 a.m. to 2:30 p.m. Friday.  Please note that voicemails left after 4:00 p.m. may not be returned until the following business day.  We are closed weekends and all major holidays.  You do  have access to a nurse 24-7, just call the main number to the clinic [phone] and do not press any options, hold on the line and a nurse will answer the phone.    For prescription refill requests, have your pharmacy contact our office and allow 72 hours.    Masks are no longer required in the cancer centers. If you would like for your care team to wear a mask while they are taking care of you, please let them know. You may have one support person who is at least 74 years old accompany you for your appointments.

## 2023-12-20 ENCOUNTER — Other Ambulatory Visit: Payer: Self-pay | Admitting: *Deleted

## 2023-12-20 MED ORDER — APIXABAN 5 MG PO TABS: 5.0000 mg | ORAL_TABLET | Freq: Two times a day (BID) | ORAL | 0 refills | Status: DC

## 2023-12-20 NOTE — Progress Notes (Signed)
 Patient requested prescription for her blood thinner to be sent to her pharmacy for pricing. I spoke with Dr. Katragadda and he wants to send Eliquis 5mg  twice daily.    RX sent.

## 2023-12-21 DIAGNOSIS — L89623 Pressure ulcer of left heel, stage 3: Secondary | ICD-10-CM | POA: Diagnosis not present

## 2023-12-21 DIAGNOSIS — E1122 Type 2 diabetes mellitus with diabetic chronic kidney disease: Secondary | ICD-10-CM | POA: Diagnosis not present

## 2023-12-21 DIAGNOSIS — D631 Anemia in chronic kidney disease: Secondary | ICD-10-CM | POA: Diagnosis not present

## 2023-12-21 DIAGNOSIS — M1990 Unspecified osteoarthritis, unspecified site: Secondary | ICD-10-CM | POA: Diagnosis not present

## 2023-12-21 DIAGNOSIS — N183 Chronic kidney disease, stage 3 unspecified: Secondary | ICD-10-CM | POA: Diagnosis not present

## 2023-12-21 DIAGNOSIS — M5030 Other cervical disc degeneration, unspecified cervical region: Secondary | ICD-10-CM | POA: Diagnosis not present

## 2023-12-21 DIAGNOSIS — I129 Hypertensive chronic kidney disease with stage 1 through stage 4 chronic kidney disease, or unspecified chronic kidney disease: Secondary | ICD-10-CM | POA: Diagnosis not present

## 2023-12-21 DIAGNOSIS — I824Y2 Acute embolism and thrombosis of unspecified deep veins of left proximal lower extremity: Secondary | ICD-10-CM | POA: Diagnosis not present

## 2023-12-21 DIAGNOSIS — E1140 Type 2 diabetes mellitus with diabetic neuropathy, unspecified: Secondary | ICD-10-CM | POA: Diagnosis not present

## 2023-12-24 DIAGNOSIS — D631 Anemia in chronic kidney disease: Secondary | ICD-10-CM | POA: Diagnosis not present

## 2023-12-24 DIAGNOSIS — N183 Chronic kidney disease, stage 3 unspecified: Secondary | ICD-10-CM | POA: Diagnosis not present

## 2023-12-24 DIAGNOSIS — E1122 Type 2 diabetes mellitus with diabetic chronic kidney disease: Secondary | ICD-10-CM | POA: Diagnosis not present

## 2023-12-24 DIAGNOSIS — E1140 Type 2 diabetes mellitus with diabetic neuropathy, unspecified: Secondary | ICD-10-CM | POA: Diagnosis not present

## 2023-12-24 DIAGNOSIS — I129 Hypertensive chronic kidney disease with stage 1 through stage 4 chronic kidney disease, or unspecified chronic kidney disease: Secondary | ICD-10-CM | POA: Diagnosis not present

## 2023-12-24 DIAGNOSIS — I824Y2 Acute embolism and thrombosis of unspecified deep veins of left proximal lower extremity: Secondary | ICD-10-CM | POA: Diagnosis not present

## 2023-12-24 DIAGNOSIS — M5030 Other cervical disc degeneration, unspecified cervical region: Secondary | ICD-10-CM | POA: Diagnosis not present

## 2023-12-24 DIAGNOSIS — M1990 Unspecified osteoarthritis, unspecified site: Secondary | ICD-10-CM | POA: Diagnosis not present

## 2023-12-24 DIAGNOSIS — L89623 Pressure ulcer of left heel, stage 3: Secondary | ICD-10-CM | POA: Diagnosis not present

## 2023-12-26 DIAGNOSIS — Z79899 Other long term (current) drug therapy: Secondary | ICD-10-CM | POA: Diagnosis not present

## 2023-12-26 DIAGNOSIS — I2510 Atherosclerotic heart disease of native coronary artery without angina pectoris: Secondary | ICD-10-CM | POA: Diagnosis not present

## 2023-12-26 DIAGNOSIS — E785 Hyperlipidemia, unspecified: Secondary | ICD-10-CM | POA: Diagnosis not present

## 2023-12-26 DIAGNOSIS — I129 Hypertensive chronic kidney disease with stage 1 through stage 4 chronic kidney disease, or unspecified chronic kidney disease: Secondary | ICD-10-CM | POA: Diagnosis not present

## 2023-12-26 DIAGNOSIS — Z96652 Presence of left artificial knee joint: Secondary | ICD-10-CM | POA: Diagnosis not present

## 2023-12-26 DIAGNOSIS — L89623 Pressure ulcer of left heel, stage 3: Secondary | ICD-10-CM | POA: Diagnosis not present

## 2023-12-26 DIAGNOSIS — Z7901 Long term (current) use of anticoagulants: Secondary | ICD-10-CM | POA: Diagnosis not present

## 2023-12-26 DIAGNOSIS — N182 Chronic kidney disease, stage 2 (mild): Secondary | ICD-10-CM | POA: Diagnosis not present

## 2023-12-26 DIAGNOSIS — E1140 Type 2 diabetes mellitus with diabetic neuropathy, unspecified: Secondary | ICD-10-CM | POA: Diagnosis not present

## 2023-12-26 DIAGNOSIS — E1122 Type 2 diabetes mellitus with diabetic chronic kidney disease: Secondary | ICD-10-CM | POA: Diagnosis not present

## 2024-01-04 DIAGNOSIS — L89623 Pressure ulcer of left heel, stage 3: Secondary | ICD-10-CM | POA: Diagnosis not present

## 2024-01-04 DIAGNOSIS — M1990 Unspecified osteoarthritis, unspecified site: Secondary | ICD-10-CM | POA: Diagnosis not present

## 2024-01-04 DIAGNOSIS — M5030 Other cervical disc degeneration, unspecified cervical region: Secondary | ICD-10-CM | POA: Diagnosis not present

## 2024-01-04 DIAGNOSIS — I129 Hypertensive chronic kidney disease with stage 1 through stage 4 chronic kidney disease, or unspecified chronic kidney disease: Secondary | ICD-10-CM | POA: Diagnosis not present

## 2024-01-04 DIAGNOSIS — N183 Chronic kidney disease, stage 3 unspecified: Secondary | ICD-10-CM | POA: Diagnosis not present

## 2024-01-04 DIAGNOSIS — I824Y2 Acute embolism and thrombosis of unspecified deep veins of left proximal lower extremity: Secondary | ICD-10-CM | POA: Diagnosis not present

## 2024-01-04 DIAGNOSIS — D631 Anemia in chronic kidney disease: Secondary | ICD-10-CM | POA: Diagnosis not present

## 2024-01-04 DIAGNOSIS — E1140 Type 2 diabetes mellitus with diabetic neuropathy, unspecified: Secondary | ICD-10-CM | POA: Diagnosis not present

## 2024-01-04 DIAGNOSIS — E1122 Type 2 diabetes mellitus with diabetic chronic kidney disease: Secondary | ICD-10-CM | POA: Diagnosis not present

## 2024-01-11 DIAGNOSIS — I129 Hypertensive chronic kidney disease with stage 1 through stage 4 chronic kidney disease, or unspecified chronic kidney disease: Secondary | ICD-10-CM | POA: Diagnosis not present

## 2024-01-11 DIAGNOSIS — I824Y2 Acute embolism and thrombosis of unspecified deep veins of left proximal lower extremity: Secondary | ICD-10-CM | POA: Diagnosis not present

## 2024-01-11 DIAGNOSIS — E1140 Type 2 diabetes mellitus with diabetic neuropathy, unspecified: Secondary | ICD-10-CM | POA: Diagnosis not present

## 2024-01-11 DIAGNOSIS — L89623 Pressure ulcer of left heel, stage 3: Secondary | ICD-10-CM | POA: Diagnosis not present

## 2024-01-11 DIAGNOSIS — M1990 Unspecified osteoarthritis, unspecified site: Secondary | ICD-10-CM | POA: Diagnosis not present

## 2024-01-11 DIAGNOSIS — M5030 Other cervical disc degeneration, unspecified cervical region: Secondary | ICD-10-CM | POA: Diagnosis not present

## 2024-01-11 DIAGNOSIS — D631 Anemia in chronic kidney disease: Secondary | ICD-10-CM | POA: Diagnosis not present

## 2024-01-11 DIAGNOSIS — N183 Chronic kidney disease, stage 3 unspecified: Secondary | ICD-10-CM | POA: Diagnosis not present

## 2024-01-11 DIAGNOSIS — E1122 Type 2 diabetes mellitus with diabetic chronic kidney disease: Secondary | ICD-10-CM | POA: Diagnosis not present

## 2024-01-16 DIAGNOSIS — E785 Hyperlipidemia, unspecified: Secondary | ICD-10-CM | POA: Diagnosis not present

## 2024-01-16 DIAGNOSIS — I129 Hypertensive chronic kidney disease with stage 1 through stage 4 chronic kidney disease, or unspecified chronic kidney disease: Secondary | ICD-10-CM | POA: Diagnosis not present

## 2024-01-16 DIAGNOSIS — I2510 Atherosclerotic heart disease of native coronary artery without angina pectoris: Secondary | ICD-10-CM | POA: Diagnosis not present

## 2024-01-16 DIAGNOSIS — E1122 Type 2 diabetes mellitus with diabetic chronic kidney disease: Secondary | ICD-10-CM | POA: Diagnosis not present

## 2024-01-16 DIAGNOSIS — L84 Corns and callosities: Secondary | ICD-10-CM | POA: Diagnosis not present

## 2024-01-16 DIAGNOSIS — M1991 Primary osteoarthritis, unspecified site: Secondary | ICD-10-CM | POA: Diagnosis not present

## 2024-01-16 DIAGNOSIS — N182 Chronic kidney disease, stage 2 (mild): Secondary | ICD-10-CM | POA: Diagnosis not present

## 2024-01-16 DIAGNOSIS — E1140 Type 2 diabetes mellitus with diabetic neuropathy, unspecified: Secondary | ICD-10-CM | POA: Diagnosis not present

## 2024-01-16 DIAGNOSIS — L89623 Pressure ulcer of left heel, stage 3: Secondary | ICD-10-CM | POA: Diagnosis not present

## 2024-01-18 DIAGNOSIS — L89623 Pressure ulcer of left heel, stage 3: Secondary | ICD-10-CM | POA: Diagnosis not present

## 2024-01-18 DIAGNOSIS — N183 Chronic kidney disease, stage 3 unspecified: Secondary | ICD-10-CM | POA: Diagnosis not present

## 2024-01-18 DIAGNOSIS — I129 Hypertensive chronic kidney disease with stage 1 through stage 4 chronic kidney disease, or unspecified chronic kidney disease: Secondary | ICD-10-CM | POA: Diagnosis not present

## 2024-01-18 DIAGNOSIS — I824Y2 Acute embolism and thrombosis of unspecified deep veins of left proximal lower extremity: Secondary | ICD-10-CM | POA: Diagnosis not present

## 2024-01-18 DIAGNOSIS — E1122 Type 2 diabetes mellitus with diabetic chronic kidney disease: Secondary | ICD-10-CM | POA: Diagnosis not present

## 2024-01-18 DIAGNOSIS — M5030 Other cervical disc degeneration, unspecified cervical region: Secondary | ICD-10-CM | POA: Diagnosis not present

## 2024-01-18 DIAGNOSIS — D631 Anemia in chronic kidney disease: Secondary | ICD-10-CM | POA: Diagnosis not present

## 2024-01-18 DIAGNOSIS — E1140 Type 2 diabetes mellitus with diabetic neuropathy, unspecified: Secondary | ICD-10-CM | POA: Diagnosis not present

## 2024-01-18 DIAGNOSIS — M1990 Unspecified osteoarthritis, unspecified site: Secondary | ICD-10-CM | POA: Diagnosis not present

## 2024-01-20 ENCOUNTER — Other Ambulatory Visit: Payer: Self-pay | Admitting: Hematology

## 2024-01-29 DIAGNOSIS — H35033 Hypertensive retinopathy, bilateral: Secondary | ICD-10-CM | POA: Diagnosis not present

## 2024-01-29 DIAGNOSIS — H524 Presbyopia: Secondary | ICD-10-CM | POA: Diagnosis not present

## 2024-01-29 DIAGNOSIS — Z961 Presence of intraocular lens: Secondary | ICD-10-CM | POA: Diagnosis not present

## 2024-01-29 DIAGNOSIS — H5203 Hypermetropia, bilateral: Secondary | ICD-10-CM | POA: Diagnosis not present

## 2024-01-29 DIAGNOSIS — E119 Type 2 diabetes mellitus without complications: Secondary | ICD-10-CM | POA: Diagnosis not present

## 2024-01-29 DIAGNOSIS — H52223 Regular astigmatism, bilateral: Secondary | ICD-10-CM | POA: Diagnosis not present

## 2024-01-29 DIAGNOSIS — H04123 Dry eye syndrome of bilateral lacrimal glands: Secondary | ICD-10-CM | POA: Diagnosis not present

## 2024-01-30 DIAGNOSIS — E1140 Type 2 diabetes mellitus with diabetic neuropathy, unspecified: Secondary | ICD-10-CM | POA: Diagnosis not present

## 2024-01-30 DIAGNOSIS — I129 Hypertensive chronic kidney disease with stage 1 through stage 4 chronic kidney disease, or unspecified chronic kidney disease: Secondary | ICD-10-CM | POA: Diagnosis not present

## 2024-01-30 DIAGNOSIS — L89623 Pressure ulcer of left heel, stage 3: Secondary | ICD-10-CM | POA: Diagnosis not present

## 2024-01-30 DIAGNOSIS — E1122 Type 2 diabetes mellitus with diabetic chronic kidney disease: Secondary | ICD-10-CM | POA: Diagnosis not present

## 2024-01-30 DIAGNOSIS — E785 Hyperlipidemia, unspecified: Secondary | ICD-10-CM | POA: Diagnosis not present

## 2024-01-30 DIAGNOSIS — M1991 Primary osteoarthritis, unspecified site: Secondary | ICD-10-CM | POA: Diagnosis not present

## 2024-01-30 DIAGNOSIS — I2510 Atherosclerotic heart disease of native coronary artery without angina pectoris: Secondary | ICD-10-CM | POA: Diagnosis not present

## 2024-01-30 DIAGNOSIS — L84 Corns and callosities: Secondary | ICD-10-CM | POA: Diagnosis not present

## 2024-01-30 DIAGNOSIS — N182 Chronic kidney disease, stage 2 (mild): Secondary | ICD-10-CM | POA: Diagnosis not present

## 2024-01-31 ENCOUNTER — Ambulatory Visit: Admitting: Physician Assistant

## 2024-01-31 ENCOUNTER — Other Ambulatory Visit (INDEPENDENT_AMBULATORY_CARE_PROVIDER_SITE_OTHER): Payer: Self-pay

## 2024-01-31 ENCOUNTER — Encounter: Payer: Self-pay | Admitting: Physician Assistant

## 2024-01-31 DIAGNOSIS — Z96652 Presence of left artificial knee joint: Secondary | ICD-10-CM

## 2024-01-31 NOTE — Progress Notes (Signed)
 HPI: [name] [name] status post left total knee arthroplasty 02/01/2023.  She states overall she is doing well.  She has some occasional achiness in the knee but overall she feels much better than preoperatively.  She continues on anticoagulation due to DVT.  She is now currently on Eliquis .  She does have an upcoming Doppler in September.  Anticoagulation managed through her primary care physician.  In regards to the wound on her heel she states the wound is almost healed.  No longer has a wound VAC.  Still in a postop shoe.  She mainly uses a cane to ambulate because she is in the postop shoe.  Review of systems: See HPI otherwise negative  Physical exam: General Well-developed well-nourished female no acute distress mood and affect appropriate Bilateral knees: Full extension and flexion to approximately 105 degrees no instability valgus varus stressing.  Dorsiflexion plantarflexion left ankle intact.  Radiographs: AP lateral views left knee shows the left knee to be well located.  Total knee arthroplasty components.  Well-seated.  No complicating features.  No acute findings.  Impression: Status post left total knee arthroplasty 02/13/2023  Plan: Recommend that she continue to work on quad strengthening.  She will follow-up with us  as needed.  Questions were encouraged and answered at length.

## 2024-02-06 DIAGNOSIS — E785 Hyperlipidemia, unspecified: Secondary | ICD-10-CM | POA: Diagnosis not present

## 2024-02-06 DIAGNOSIS — L89623 Pressure ulcer of left heel, stage 3: Secondary | ICD-10-CM | POA: Diagnosis not present

## 2024-02-06 DIAGNOSIS — Z96652 Presence of left artificial knee joint: Secondary | ICD-10-CM | POA: Diagnosis not present

## 2024-02-06 DIAGNOSIS — E1140 Type 2 diabetes mellitus with diabetic neuropathy, unspecified: Secondary | ICD-10-CM | POA: Diagnosis not present

## 2024-02-06 DIAGNOSIS — Z86718 Personal history of other venous thrombosis and embolism: Secondary | ICD-10-CM | POA: Diagnosis not present

## 2024-02-06 DIAGNOSIS — E11621 Type 2 diabetes mellitus with foot ulcer: Secondary | ICD-10-CM | POA: Diagnosis not present

## 2024-02-06 DIAGNOSIS — N182 Chronic kidney disease, stage 2 (mild): Secondary | ICD-10-CM | POA: Diagnosis not present

## 2024-02-06 DIAGNOSIS — I2510 Atherosclerotic heart disease of native coronary artery without angina pectoris: Secondary | ICD-10-CM | POA: Diagnosis not present

## 2024-02-06 DIAGNOSIS — E1122 Type 2 diabetes mellitus with diabetic chronic kidney disease: Secondary | ICD-10-CM | POA: Diagnosis not present

## 2024-02-06 DIAGNOSIS — I824Y2 Acute embolism and thrombosis of unspecified deep veins of left proximal lower extremity: Secondary | ICD-10-CM | POA: Diagnosis not present

## 2024-02-06 DIAGNOSIS — I129 Hypertensive chronic kidney disease with stage 1 through stage 4 chronic kidney disease, or unspecified chronic kidney disease: Secondary | ICD-10-CM | POA: Diagnosis not present

## 2024-02-20 DIAGNOSIS — E1140 Type 2 diabetes mellitus with diabetic neuropathy, unspecified: Secondary | ICD-10-CM | POA: Diagnosis not present

## 2024-02-20 DIAGNOSIS — E1122 Type 2 diabetes mellitus with diabetic chronic kidney disease: Secondary | ICD-10-CM | POA: Diagnosis not present

## 2024-02-20 DIAGNOSIS — E785 Hyperlipidemia, unspecified: Secondary | ICD-10-CM | POA: Diagnosis not present

## 2024-02-20 DIAGNOSIS — I129 Hypertensive chronic kidney disease with stage 1 through stage 4 chronic kidney disease, or unspecified chronic kidney disease: Secondary | ICD-10-CM | POA: Diagnosis not present

## 2024-02-20 DIAGNOSIS — I824Y2 Acute embolism and thrombosis of unspecified deep veins of left proximal lower extremity: Secondary | ICD-10-CM | POA: Diagnosis not present

## 2024-02-20 DIAGNOSIS — L89623 Pressure ulcer of left heel, stage 3: Secondary | ICD-10-CM | POA: Diagnosis not present

## 2024-02-20 DIAGNOSIS — I2510 Atherosclerotic heart disease of native coronary artery without angina pectoris: Secondary | ICD-10-CM | POA: Diagnosis not present

## 2024-02-20 DIAGNOSIS — E11621 Type 2 diabetes mellitus with foot ulcer: Secondary | ICD-10-CM | POA: Diagnosis not present

## 2024-02-20 DIAGNOSIS — N182 Chronic kidney disease, stage 2 (mild): Secondary | ICD-10-CM | POA: Diagnosis not present

## 2024-03-05 DIAGNOSIS — I2510 Atherosclerotic heart disease of native coronary artery without angina pectoris: Secondary | ICD-10-CM | POA: Diagnosis not present

## 2024-03-05 DIAGNOSIS — E1140 Type 2 diabetes mellitus with diabetic neuropathy, unspecified: Secondary | ICD-10-CM | POA: Diagnosis not present

## 2024-03-05 DIAGNOSIS — I129 Hypertensive chronic kidney disease with stage 1 through stage 4 chronic kidney disease, or unspecified chronic kidney disease: Secondary | ICD-10-CM | POA: Diagnosis not present

## 2024-03-05 DIAGNOSIS — Z86718 Personal history of other venous thrombosis and embolism: Secondary | ICD-10-CM | POA: Diagnosis not present

## 2024-03-05 DIAGNOSIS — Z96652 Presence of left artificial knee joint: Secondary | ICD-10-CM | POA: Diagnosis not present

## 2024-03-05 DIAGNOSIS — E1122 Type 2 diabetes mellitus with diabetic chronic kidney disease: Secondary | ICD-10-CM | POA: Diagnosis not present

## 2024-03-05 DIAGNOSIS — I82402 Acute embolism and thrombosis of unspecified deep veins of left lower extremity: Secondary | ICD-10-CM | POA: Diagnosis not present

## 2024-03-05 DIAGNOSIS — L89623 Pressure ulcer of left heel, stage 3: Secondary | ICD-10-CM | POA: Diagnosis not present

## 2024-03-05 DIAGNOSIS — N182 Chronic kidney disease, stage 2 (mild): Secondary | ICD-10-CM | POA: Diagnosis not present

## 2024-03-05 DIAGNOSIS — Z7901 Long term (current) use of anticoagulants: Secondary | ICD-10-CM | POA: Diagnosis not present

## 2024-03-05 NOTE — Progress Notes (Addendum)
 [name] [name] 73 y.o. April 14, 1951 Phone: [phone] (home)  Address: [address] [place] [address]  MRN: [id number] Primary MD : Bertrum Tanda Gaskins  [facility]   Problem List Items Addressed This Visit     Pressure injury of left heel, stage 3 (CMS-HCC) - Primary   Pressure injury of left heel, DVT in fall 2024,  left knee replacement 02/13/23  Ms. Deas's wound was initially treated with a wound vac and then switched to hydrafera blue.  She was swiched to Mepilex.  The wound is now too small to accommodate the Mepilex easily.  We will switch to aquacel ag rope.  Change MWF.  I will see her back in two weeks.    Preoperative diagnosis:  Biofilm on wound, peri ulcer callus  Postoperative diagnosis: Same  Procedure performed: Removal of biofilm and two peri ulcer calluses   Anesthesia: None  Surgeon: Duwaine Jumper, MD  Procedure performed in detail:   After informed consent was obtained, a 3 curette was used to remove slough from the wound, 0.3 x 1.8 cm area.  There is good granulation tissue. The curette was used to excise two peri ulcer calluses. Patient tolerated procedure well.  I personally spent over 20 minutes face-to-face and non-face-to-face in the care of this patient, which includes all pre, intra, and post visit time on the date of service.  All documented time was specific to the E/M visit and does not include any procedures that may have been performed.   Wound Care Visit  Past Medical History:  Diagnosis Date  . Arthritis   . CAD (coronary artery disease)   . Carpal tunnel syndrome on both sides   . CKD (chronic kidney disease) stage 2, GFR 60-89 ml/min   . Constipation   . DDD (degenerative disc disease), cervical   . DDD (degenerative disc disease), lumbar   . Diabetes mellitus    (CMS-HCC)   . DVT (deep venous thrombosis)    (CMS-HCC)    on eliquis   . GERD (gastroesophageal reflux disease)   . Hyperlipidemia   .  Hypertension   . IBS (irritable bowel syndrome)   . Neuropathy   . NSTEMI (non-ST elevated myocardial infarction)    (CMS-HCC) 2004   x 1 stent  . OSA (obstructive sleep apnea)   . Pulmonary nodule   . Vertigo    Past Surgical History:  Procedure Laterality Date  . ABDOMINAL HYSTERECTOMY     partial  . CARDIAC CATHETERIZATION  2016  . CERVICAL LAMINECTOMY Left    anterior  . CHOLECYSTECTOMY    . COLONOSCOPY  2020  . LUMBAR LAMINECTOMY     L4-5 w pedicle screw and rod  . SHOULDER ARTHROSCOPY W/ ROTATOR CUFF REPAIR Right   . TOTAL KNEE ARTHROPLASTY Left 2024  . TOTAL KNEE ARTHROPLASTY Right 2023   Allergies  Allergen Reactions  . Baclofen     Upset stomach, excessive fatigue, loss of balance  . Methocarbamol      Upset stomach, excessive fatigue, loss of balance    Meds:  Current Outpatient Medications:  .  amlodipine  (NORVASC ) 5 MG tablet, Take 1 tablet (5 mg total) by mouth daily., Disp: , Rfl:  .  apixaban  (ELIQUIS ) 5 mg Tab, Take 1 tablet (5 mg total) by mouth two (2) times a day., Disp: , Rfl:  .  atorvastatin  (LIPITOR) 40 MG tablet, Take 1 tablet (40 mg total) by mouth daily., Disp: , Rfl:  .  carvedilol (COREG)  6.25 MG tablet, Take 0.5 tablets (3.125 mg total) by mouth two (2) times a day., Disp: , Rfl:  .  cholecalciferol , vitamin D3 25 mcg, 1,000 units,, 1,000 unit (25 mcg) tablet, Take 1 tablet (25 mcg total) by mouth daily., Disp: , Rfl:  .  gabapentin  (NEURONTIN ) 300 MG capsule, Take 1 capsule (300 mg total) by mouth nightly., Disp: , Rfl:   SocHx:  reports that she has never smoked. She has never been exposed to tobacco smoke. She has never used smokeless tobacco. She reports that she does not currently use drugs. She reports that she does not drink alcohol .  FamHx: has no family status information on file.    Review of Systems A 12 system review of systems was negative except as noted in HPI  special needs met: patient walks with cane  Subjective:      [name] [name] presents to the wound care clinic for evaluation of a left heel pressure injury.  She underwent a left total knee replacement on 02/13/2023 by Dr. Vernetta at [facility].  She developed acute encephalopathy and was admitted to [facility] hospital from 04/17/2023 to 04/26/2023.  I personally reviewed the operative note and [facility] discharge notes.  She was diagnosed with acute deep vein thrombosis of the left lower extremity.  She was placed on Eliquis .  Mention was noted of a nonhealing left heel wound.  She was at the [facility] when it started..  She is seen for evaluation and management.  03/05/24  She returns for follow up. She has no complaints.  02/20/24  She returns for follow up.  She is feeling a little depressed she can't dress up and wear her dress shoes.  02/06/24  She returns for follow up.  They deny any issues with the Mepilex.  The drainage remains minimal.  01/30/24  She returns for follow up.  They report minimal to no drainage on the dressing.  01/16/24  She returns for follow up.  She is eager to have this wound be healed.  12/26/23  She returns for follow up.  She is ready to get to stop wearing the boot.  12/12/23  She returns for follow up.  She has no complaints today  11/21/23  She returns for follow up.  She has no complaints.  She is eager to get her graft.  She is getting impatient with how it is healing.  11/14/23  She returns for follow up.  She is wondering when she might have a charity graft.  10/31/23  She returns for follow up.  She has no complaints.  10/03/23  She returns for follow up.  She and her son think the hydrafera blue is working well.  09/26/23  She returns for follow up.  Her son reports there was a lot of drainage since Sunday.  09/19/23  She returns for follow up.  She is hoping she can get the vac off today.  09/05/23  She returns for follow up.  She has a dry cough and a sinus infection.  She has laboratory studies for  her primary care appointment today.  08/15/23  She returns for follow up.  She still doesn't like the vac but her son thinks it is helping.  She reports there is minimal drainage.  08/01/23  She returns for follow up.  She is not the biggest fan of the wound vac.  Her son thinks it is really helping.  07/18/23  She returns for follow up.  Her  insurance is not in network for two companies.  Trying a third to obtain a wound vac.  07/11/23  She returns for follow up. She reports it just hurts from time to time.  07/04/23  She returns for follow up. Her son notes no odor with the dressing changes and little drainage.  06/26/23  She returns for follow up.  She has no concerns or issues.  Her son is doing the dressing changes without difficulty.  06/21/23  Returns for follow up.  She had a nice Christmas and reports staying off of her heel.  06/13/23  She returns for follow up.  Her son thinks her heel is doing well. The patient reports she is walking a lot around the house.  06/06/23  Initial evaluation.  Objective:   BP 159/86   Pulse 57   Temp 36.5 C (97.7 F)   Resp 18   SpO2 97%  General:  well appearing  Pulmonary: CTAB, no wheezes, rhonci, crackles  CV:   RRR, S1,S2, no murmurs, gallops,rubs  GI: soft, bowel sounds active, non-tender  Neuro:     AAO x 3  Psych : Normal mood and judgement  Pulse :   +2 DP  Wound:  Left heel   1.8 x 0.3 cm  Surrounding callus

## 2024-03-06 DIAGNOSIS — E1140 Type 2 diabetes mellitus with diabetic neuropathy, unspecified: Secondary | ICD-10-CM | POA: Diagnosis not present

## 2024-03-06 DIAGNOSIS — Z7901 Long term (current) use of anticoagulants: Secondary | ICD-10-CM | POA: Diagnosis not present

## 2024-03-06 DIAGNOSIS — E7849 Other hyperlipidemia: Secondary | ICD-10-CM | POA: Diagnosis not present

## 2024-03-06 DIAGNOSIS — D649 Anemia, unspecified: Secondary | ICD-10-CM | POA: Diagnosis not present

## 2024-03-06 DIAGNOSIS — R7301 Impaired fasting glucose: Secondary | ICD-10-CM | POA: Diagnosis not present

## 2024-03-06 DIAGNOSIS — N1832 Chronic kidney disease, stage 3b: Secondary | ICD-10-CM | POA: Diagnosis not present

## 2024-03-11 ENCOUNTER — Ambulatory Visit (HOSPITAL_COMMUNITY)
Admission: RE | Admit: 2024-03-11 | Discharge: 2024-03-11 | Disposition: A | Discharge status: Home / Self Care | Source: Ambulatory Visit | Attending: Hematology | Admitting: Hematology

## 2024-03-11 ENCOUNTER — Inpatient Hospital Stay: Attending: Hematology

## 2024-03-11 DIAGNOSIS — Z96652 Presence of left artificial knee joint: Secondary | ICD-10-CM | POA: Insufficient documentation

## 2024-03-11 DIAGNOSIS — I82512 Chronic embolism and thrombosis of left femoral vein: Secondary | ICD-10-CM | POA: Insufficient documentation

## 2024-03-11 DIAGNOSIS — Z809 Family history of malignant neoplasm, unspecified: Secondary | ICD-10-CM | POA: Diagnosis not present

## 2024-03-11 DIAGNOSIS — Z860102 Personal history of hyperplastic colon polyps: Secondary | ICD-10-CM | POA: Diagnosis not present

## 2024-03-11 DIAGNOSIS — M2550 Pain in unspecified joint: Secondary | ICD-10-CM | POA: Diagnosis not present

## 2024-03-11 DIAGNOSIS — I82502 Chronic embolism and thrombosis of unspecified deep veins of left lower extremity: Secondary | ICD-10-CM

## 2024-03-11 DIAGNOSIS — R7989 Other specified abnormal findings of blood chemistry: Secondary | ICD-10-CM | POA: Diagnosis not present

## 2024-03-11 DIAGNOSIS — G4700 Insomnia, unspecified: Secondary | ICD-10-CM | POA: Insufficient documentation

## 2024-03-11 DIAGNOSIS — R5383 Other fatigue: Secondary | ICD-10-CM | POA: Insufficient documentation

## 2024-03-11 DIAGNOSIS — Z7901 Long term (current) use of anticoagulants: Secondary | ICD-10-CM | POA: Diagnosis not present

## 2024-03-11 DIAGNOSIS — K5900 Constipation, unspecified: Secondary | ICD-10-CM | POA: Insufficient documentation

## 2024-03-11 DIAGNOSIS — R531 Weakness: Secondary | ICD-10-CM | POA: Diagnosis not present

## 2024-03-11 DIAGNOSIS — Z79899 Other long term (current) drug therapy: Secondary | ICD-10-CM | POA: Diagnosis not present

## 2024-03-11 LAB — D-DIMER, QUANTITATIVE: D-Dimer, Quant: 1.19 ug{FEU}/mL — ABNORMAL HIGH (ref 0.00–0.50)

## 2024-03-13 DIAGNOSIS — Z7901 Long term (current) use of anticoagulants: Secondary | ICD-10-CM | POA: Diagnosis not present

## 2024-03-13 DIAGNOSIS — N1831 Chronic kidney disease, stage 3a: Secondary | ICD-10-CM | POA: Diagnosis not present

## 2024-03-13 DIAGNOSIS — Z23 Encounter for immunization: Secondary | ICD-10-CM | POA: Diagnosis not present

## 2024-03-13 DIAGNOSIS — E1140 Type 2 diabetes mellitus with diabetic neuropathy, unspecified: Secondary | ICD-10-CM | POA: Diagnosis not present

## 2024-03-13 DIAGNOSIS — Z1231 Encounter for screening mammogram for malignant neoplasm of breast: Secondary | ICD-10-CM | POA: Diagnosis not present

## 2024-03-13 DIAGNOSIS — Z6834 Body mass index (BMI) 34.0-34.9, adult: Secondary | ICD-10-CM | POA: Diagnosis not present

## 2024-03-13 DIAGNOSIS — E782 Mixed hyperlipidemia: Secondary | ICD-10-CM | POA: Diagnosis not present

## 2024-03-19 ENCOUNTER — Inpatient Hospital Stay: Admitting: Oncology

## 2024-03-19 ENCOUNTER — Other Ambulatory Visit

## 2024-03-19 VITALS — BP 157/62 | HR 47 | Temp 96.7°F | Resp 20 | Wt 220.2 lb

## 2024-03-19 DIAGNOSIS — Z86718 Personal history of other venous thrombosis and embolism: Secondary | ICD-10-CM | POA: Diagnosis not present

## 2024-03-19 DIAGNOSIS — M2550 Pain in unspecified joint: Secondary | ICD-10-CM | POA: Diagnosis not present

## 2024-03-19 DIAGNOSIS — Z96652 Presence of left artificial knee joint: Secondary | ICD-10-CM | POA: Diagnosis not present

## 2024-03-19 DIAGNOSIS — Z7901 Long term (current) use of anticoagulants: Secondary | ICD-10-CM | POA: Diagnosis not present

## 2024-03-19 DIAGNOSIS — G4700 Insomnia, unspecified: Secondary | ICD-10-CM | POA: Diagnosis not present

## 2024-03-19 DIAGNOSIS — R531 Weakness: Secondary | ICD-10-CM | POA: Diagnosis not present

## 2024-03-19 DIAGNOSIS — N182 Chronic kidney disease, stage 2 (mild): Secondary | ICD-10-CM | POA: Diagnosis not present

## 2024-03-19 DIAGNOSIS — L89623 Pressure ulcer of left heel, stage 3: Secondary | ICD-10-CM | POA: Diagnosis not present

## 2024-03-19 DIAGNOSIS — R5383 Other fatigue: Secondary | ICD-10-CM | POA: Diagnosis not present

## 2024-03-19 DIAGNOSIS — I82402 Acute embolism and thrombosis of unspecified deep veins of left lower extremity: Secondary | ICD-10-CM | POA: Insufficient documentation

## 2024-03-19 DIAGNOSIS — L89620 Pressure ulcer of left heel, unstageable: Secondary | ICD-10-CM | POA: Diagnosis not present

## 2024-03-19 DIAGNOSIS — I82512 Chronic embolism and thrombosis of left femoral vein: Secondary | ICD-10-CM | POA: Diagnosis not present

## 2024-03-19 DIAGNOSIS — K5900 Constipation, unspecified: Secondary | ICD-10-CM | POA: Diagnosis not present

## 2024-03-19 DIAGNOSIS — E1122 Type 2 diabetes mellitus with diabetic chronic kidney disease: Secondary | ICD-10-CM | POA: Diagnosis not present

## 2024-03-19 DIAGNOSIS — R7989 Other specified abnormal findings of blood chemistry: Secondary | ICD-10-CM | POA: Diagnosis not present

## 2024-03-19 DIAGNOSIS — I82502 Chronic embolism and thrombosis of unspecified deep veins of left lower extremity: Secondary | ICD-10-CM

## 2024-03-19 DIAGNOSIS — I129 Hypertensive chronic kidney disease with stage 1 through stage 4 chronic kidney disease, or unspecified chronic kidney disease: Secondary | ICD-10-CM | POA: Diagnosis not present

## 2024-03-19 DIAGNOSIS — E1140 Type 2 diabetes mellitus with diabetic neuropathy, unspecified: Secondary | ICD-10-CM | POA: Diagnosis not present

## 2024-03-19 DIAGNOSIS — I2510 Atherosclerotic heart disease of native coronary artery without angina pectoris: Secondary | ICD-10-CM | POA: Diagnosis not present

## 2024-03-19 MED ORDER — APIXABAN 5 MG PO TABS: 5.0000 mg | ORAL_TABLET | Freq: Two times a day (BID) | ORAL | 0 refills | Status: AC

## 2024-03-19 NOTE — Assessment & Plan Note (Addendum)
-   Most recent left lower extremity Doppler showed small caliber left femoral vein with probable component of chronic nonocclusive mural thrombus especially in the distal segment, limited evaluation of the calf veins with potential thrombus in 1 of 2 posterior tibial veins this is quite difficult to completely delineate and may be consistent with chronic thrombus. - D-dimer is 1.19 which is considered elevated. -Recommend she continue Eliquis . -We discussed that patient is relatively inactive although she is slowly getting better.  She developed a left heel pressure ulcer following her surgery back in 2024 and has still not recovered fully.  She is wearing a boot on her left foot which does limit her ability to move around without fear of falling. -Given sedentary lifestyle with elevated D-dimer but more or less stable ultrasound, would recommend an additional 3 months on Eliquis .  She was agreeable.  Return to clinic in 3 months with labs.

## 2024-03-19 NOTE — Progress Notes (Signed)
 [name] OFFICE PROGRESS NOTE  [name] [name] BRAVO, MD  ASSESSMENT & PLAN:    Assessment & Plan Chronic deep vein thrombosis (DVT) of femoral vein of left lower extremity (HCC) - Most recent left lower extremity Doppler showed small caliber left femoral vein with probable component of chronic nonocclusive mural thrombus especially in the distal segment, limited evaluation of the calf veins with potential thrombus in 1 of 2 posterior tibial veins this is quite difficult to completely delineate and may be consistent with chronic thrombus. - D-dimer is 1.19 which is considered elevated. -Recommend she continue Eliquis . -We discussed that patient is relatively inactive although she is slowly getting better.  She developed a left heel pressure ulcer following her surgery back in 2024 and has still not recovered fully.  She is wearing a boot on her left foot which does limit her ability to move around without fear of falling. -Given sedentary lifestyle with elevated D-dimer but more or less stable ultrasound, would recommend an additional 3 months on Eliquis .  She was agreeable.  Return to clinic in 3 months with labs.  Orders Placed This Encounter  Procedures   US  Venous Img Lower Unilateral Left    Standing Status:   Future    Expected Date:   06/19/2024    Expiration Date:   09/17/2024    Reason for Exam (SYMPTOM  OR DIAGNOSIS REQUIRED):   DVT of left leg    Preferred imaging location?:   [facility]    Release to patient:   Immediate   D-dimer, quantitative    Standing Status:   Future    Expected Date:   06/23/2024    Expiration Date:   03/24/2025    Release to patient:   Immediate [1]   CBC with Differential    Standing Status:   Future    Expected Date:   06/23/2024    Expiration Date:   03/24/2025    INTERVAL HISTORY: Patient returns for follow-up.  She is currently still taking Eliquis  daily.  Denies any bleeding, bright red blood per rectum, hematochezia  or melena.  Patient is followed by the wound clinic in [facility] for left heel pressure injury after a left total knee replacement on 02/13/2023.  Reports she continues to wear a boot on her left foot which does limit her ability to move around.  She is scared of falling.  Unfortunately, she continues to be relatively sedentary since her left total knee replacement surgery.  She is also having constipation, low energy levels, pain in her legs, numbness in her hands and feet.  She does not sleep well at night but does use her CPAP.  No recent falls.  We reviewed D-dimer.  SUMMARY OF HEMATOLOGIC HISTORY: Oncology History Overview Note  1.  Provoked left leg DVT: - Patient seen at the request of Dr. Lari. - Had left knee replacement on 02/13/2023, and was at the rehab center in [place] with decreased mobility. - 02/2023: Diagnosed with left leg DVT.  I do not have the original scan report to review. - She was initially started on Eliquis , which was changed to Coumadin due to cost. - Doppler on 06/21/2023: Age-indeterminate DVT involving left femoral vein, left popliteal vein. - Doppler on 09/17/2023: Findings consistent with chronic DVT involving left femoral vein. - Doppler on 12/13/2023: Small area of nonocclusive thrombus along the medial femoral vein. - She continues to be on warfarin 4 mg daily. - She had colonoscopy in  November 2020, 1 hyperplastic polyp removed.  She is having mammograms every 2 years and is scheduled to have it soon at Dr. Cindi office. - No B symptoms.  No miscarriages.   2.  Social/family history: - She lives at home by itself and walks with the help of walker and cane.  Son lives about 10 minutes away and checks on her daily.  She is non-smoker. - No family history of thrombosis.  Sister had cancer, type unknown to the patient.  Brother died of cancer.    No history exists.     CBC    Component Value Date/Time   WBC 7.8 02/20/2023 1256   RBC 3.29 (L) 02/20/2023 1256    HGB 9.9 (L) 02/20/2023 1256   HCT 31.2 (L) 02/20/2023 1256   PLT 291 02/20/2023 1256   MCV 94.8 02/20/2023 1256   MCH 30.1 02/20/2023 1256   MCHC 31.7 02/20/2023 1256   RDW 13.4 02/20/2023 1256   LYMPHSABS 1.0 02/20/2023 1256   MONOABS 0.5 02/20/2023 1256   EOSABS 0.2 02/20/2023 1256   BASOSABS 0.1 02/20/2023 1256       Latest Ref Rng & Units 02/20/2023   12:56 PM 02/14/2023    3:33 AM 01/15/2023    2:00 PM  CMP  Glucose 70 - 99 mg/dL 881  873  92   BUN 8 - 23 mg/dL 30  23  18    Creatinine 0.44 - 1.00 mg/dL 8.90  8.96  8.81   Sodium 135 - 145 mmol/L 136  138  141   Potassium 3.5 - 5.1 mmol/L 3.8  3.9  3.7   Chloride 98 - 111 mmol/L 99  105  109   CO2 22 - 32 mmol/L 29  23  26    Calcium  8.9 - 10.3 mg/dL 9.0  9.2  9.5   Total Protein 6.5 - 8.1 g/dL 7.2   6.6   Total Bilirubin 0.3 - 1.2 mg/dL 1.7   0.3   Alkaline Phos 38 - 126 U/L 88   76   AST 15 - 41 U/L 100   21   ALT 0 - 44 U/L 56   13      Lab Results  Component Value Date   VITAMINB12 183 (L) 09/05/2022    Vitals:   03/19/24 1112 03/19/24 1115  BP: (!) 168/68 (!) 157/62  Pulse: (!) 47   Resp: 20   Temp: (!) 96.7 F (35.9 C)   SpO2: 99%     Review of System:  Review of Systems  Constitutional:  Positive for malaise/fatigue.  Gastrointestinal:  Positive for constipation.  Musculoskeletal:  Positive for joint pain.  Skin:        Wound to left heel   Neurological:  Positive for tingling, sensory change and weakness.  Psychiatric/Behavioral:  The patient has insomnia.     Physical Exam: Physical Exam Constitutional:      Appearance: Normal appearance.  HENT:     Head: Normocephalic and atraumatic.  Eyes:     Pupils: Pupils are equal, round, and reactive to light.  Cardiovascular:     Rate and Rhythm: Normal rate and regular rhythm.     Heart sounds: Normal heart sounds. No murmur heard. Pulmonary:     Effort: Pulmonary effort is normal.     Breath sounds: Normal breath sounds. No wheezing.   Abdominal:     General: Bowel sounds are normal. There is no distension.     Palpations: Abdomen is soft.  Tenderness: There is no abdominal tenderness.  Musculoskeletal:        General: Normal range of motion.     Cervical back: Normal range of motion.  Feet:     Comments: Left foot boot in place Skin:    General: Skin is warm and dry.     Findings: No rash.  Neurological:     Mental Status: She is alert and oriented to person, place, and time.     Gait: Gait is intact.  Psychiatric:        Mood and Affect: Mood and affect normal.        Cognition and Memory: Memory normal.        Judgment: Judgment normal.      I spent 20 minutes dedicated to the care of this patient (face-to-face and non-face-to-face) on the date of the encounter to include what is described in the assessment and plan.,  Delon Hope, NP 03/24/2024 4:20 PM

## 2024-04-02 DIAGNOSIS — E1140 Type 2 diabetes mellitus with diabetic neuropathy, unspecified: Secondary | ICD-10-CM | POA: Diagnosis not present

## 2024-04-02 DIAGNOSIS — Z86718 Personal history of other venous thrombosis and embolism: Secondary | ICD-10-CM | POA: Diagnosis not present

## 2024-04-02 DIAGNOSIS — Z79899 Other long term (current) drug therapy: Secondary | ICD-10-CM | POA: Diagnosis not present

## 2024-04-02 DIAGNOSIS — L89623 Pressure ulcer of left heel, stage 3: Secondary | ICD-10-CM | POA: Diagnosis not present

## 2024-04-02 DIAGNOSIS — Z90710 Acquired absence of both cervix and uterus: Secondary | ICD-10-CM | POA: Diagnosis not present

## 2024-04-02 DIAGNOSIS — E1122 Type 2 diabetes mellitus with diabetic chronic kidney disease: Secondary | ICD-10-CM | POA: Diagnosis not present

## 2024-04-02 DIAGNOSIS — Z7901 Long term (current) use of anticoagulants: Secondary | ICD-10-CM | POA: Diagnosis not present

## 2024-04-02 DIAGNOSIS — E785 Hyperlipidemia, unspecified: Secondary | ICD-10-CM | POA: Diagnosis not present

## 2024-04-02 DIAGNOSIS — N182 Chronic kidney disease, stage 2 (mild): Secondary | ICD-10-CM | POA: Diagnosis not present

## 2024-04-16 DIAGNOSIS — Z90710 Acquired absence of both cervix and uterus: Secondary | ICD-10-CM | POA: Diagnosis not present

## 2024-04-16 DIAGNOSIS — E1140 Type 2 diabetes mellitus with diabetic neuropathy, unspecified: Secondary | ICD-10-CM | POA: Diagnosis not present

## 2024-04-16 DIAGNOSIS — E1122 Type 2 diabetes mellitus with diabetic chronic kidney disease: Secondary | ICD-10-CM | POA: Diagnosis not present

## 2024-04-16 DIAGNOSIS — L89623 Pressure ulcer of left heel, stage 3: Secondary | ICD-10-CM | POA: Diagnosis not present

## 2024-04-16 DIAGNOSIS — E785 Hyperlipidemia, unspecified: Secondary | ICD-10-CM | POA: Diagnosis not present

## 2024-04-16 DIAGNOSIS — Z86718 Personal history of other venous thrombosis and embolism: Secondary | ICD-10-CM | POA: Diagnosis not present

## 2024-04-16 DIAGNOSIS — Z7901 Long term (current) use of anticoagulants: Secondary | ICD-10-CM | POA: Diagnosis not present

## 2024-04-16 DIAGNOSIS — Z79899 Other long term (current) drug therapy: Secondary | ICD-10-CM | POA: Diagnosis not present

## 2024-04-16 DIAGNOSIS — N182 Chronic kidney disease, stage 2 (mild): Secondary | ICD-10-CM | POA: Diagnosis not present

## 2024-04-28 ENCOUNTER — Encounter: Payer: Self-pay | Admitting: Radiology

## 2024-04-30 DIAGNOSIS — E1140 Type 2 diabetes mellitus with diabetic neuropathy, unspecified: Secondary | ICD-10-CM | POA: Diagnosis not present

## 2024-04-30 DIAGNOSIS — E785 Hyperlipidemia, unspecified: Secondary | ICD-10-CM | POA: Diagnosis not present

## 2024-04-30 DIAGNOSIS — Z86718 Personal history of other venous thrombosis and embolism: Secondary | ICD-10-CM | POA: Diagnosis not present

## 2024-04-30 DIAGNOSIS — Z79899 Other long term (current) drug therapy: Secondary | ICD-10-CM | POA: Diagnosis not present

## 2024-04-30 DIAGNOSIS — Z90710 Acquired absence of both cervix and uterus: Secondary | ICD-10-CM | POA: Diagnosis not present

## 2024-04-30 DIAGNOSIS — L89623 Pressure ulcer of left heel, stage 3: Secondary | ICD-10-CM | POA: Diagnosis not present

## 2024-04-30 DIAGNOSIS — E1122 Type 2 diabetes mellitus with diabetic chronic kidney disease: Secondary | ICD-10-CM | POA: Diagnosis not present

## 2024-04-30 DIAGNOSIS — N182 Chronic kidney disease, stage 2 (mild): Secondary | ICD-10-CM | POA: Diagnosis not present

## 2024-04-30 DIAGNOSIS — Z7901 Long term (current) use of anticoagulants: Secondary | ICD-10-CM | POA: Diagnosis not present

## 2024-05-05 DIAGNOSIS — L89620 Pressure ulcer of left heel, unstageable: Secondary | ICD-10-CM | POA: Diagnosis not present

## 2024-05-13 ENCOUNTER — Other Ambulatory Visit: Payer: Self-pay | Admitting: Oncology

## 2024-05-13 DIAGNOSIS — I82512 Chronic embolism and thrombosis of left femoral vein: Secondary | ICD-10-CM

## 2024-05-14 DIAGNOSIS — L89623 Pressure ulcer of left heel, stage 3: Secondary | ICD-10-CM | POA: Diagnosis not present

## 2024-05-14 NOTE — Progress Notes (Signed)
 [name] [name] [name] 73 y.o. [birth year] Phone: [phone] (home)  Address: [address] [place] [address]  MRN: [id number] Primary MD : Bertrum Tanda Gaskins  [facility]   Problem List Items Addressed This Visit       Other   Pressure injury of left heel, stage 3 (CMS-HCC) - Primary   Pressure injury of left heel, DVT in fall 2024,  left knee replacement 02/13/23  [name] [name] [name]'s wound was treated with a wound vac, hydrafera blue, Mepilex and aquacel ag rope.  The open wound is at the bottom of a lot of callus.  It is improving with promogran ag.  Continue promogran ag.  Continue with a liberal coating of aquacel around the heel.  Change MWF.  I will see her back in two weeks.    Preoperative diagnosis:  Biofilm on wound, four peri ulcer calluses  Postoperative diagnosis: Same  Procedure performed: Removal of biofilm and paring of four calluses  Anesthesia: EMLA  Surgeon: [name] Jumper, MD  Procedure performed in detail:   After informed consent was obtained, a 15 blade was used to remove slough from the wound, 1.1 x 0. 2 cm area.  There is good granulation tissue. The 15 blade was used to excise four peri ulcer calluses.  Patient tolerated procedure well.  I personally spent over 20 minutes face-to-face and non-face-to-face in the care of this patient, which includes all pre, intra, and post visit time on the date of service.  All documented time was specific to the E/M visit and does not include any procedures that may have been performed.   Wound Care Visit  Past Medical History:  Diagnosis Date  . Arthritis   . CAD (coronary artery disease)   . Carpal tunnel syndrome on both sides   . CKD (chronic kidney disease) stage 2, GFR 60-89 ml/min   . Constipation   . DDD (degenerative disc disease), cervical   . DDD (degenerative disc disease), lumbar   . Diabetes mellitus    (CMS-HCC)   . DVT (deep venous thrombosis)    (CMS-HCC)    on eliquis    . GERD (gastroesophageal reflux disease)   . Hyperlipidemia   . Hypertension   . IBS (irritable bowel syndrome)   . Neuropathy   . NSTEMI (non-ST elevated myocardial infarction)    (CMS-HCC) 2004   x 1 stent  . OSA (obstructive sleep apnea)   . Pulmonary nodule   . Vertigo    Past Surgical History:  Procedure Laterality Date  . ABDOMINAL HYSTERECTOMY     partial  . CARDIAC CATHETERIZATION  2016  . CERVICAL LAMINECTOMY Left    anterior  . CHOLECYSTECTOMY    . COLONOSCOPY  2020  . LUMBAR LAMINECTOMY     L4-5 w pedicle screw and rod  . SHOULDER ARTHROSCOPY W/ ROTATOR CUFF REPAIR Right   . TOTAL KNEE ARTHROPLASTY Left 2024  . TOTAL KNEE ARTHROPLASTY Right 2023   Allergies  Allergen Reactions  . Baclofen     Upset stomach, excessive fatigue, loss of balance  . Methocarbamol      Upset stomach, excessive fatigue, loss of balance    Meds:  Current Outpatient Medications:  .  amlodipine  (NORVASC ) 5 MG tablet, Take 1 tablet (5 mg total) by mouth daily., Disp: , Rfl:  .  apixaban  (ELIQUIS ) 5 mg Tab, Take 1 tablet (5 mg total) by mouth two (2) times a day., Disp: , Rfl:  .  atorvastatin  (LIPITOR) 40 MG tablet,  Take 1 tablet (40 mg total) by mouth daily., Disp: , Rfl:  .  carvedilol (COREG) 6.25 MG tablet, Take 0.5 tablets (3.125 mg total) by mouth two (2) times a day., Disp: , Rfl:  .  cholecalciferol , vitamin D3 25 mcg, 1,000 units,, 1,000 unit (25 mcg) tablet, Take 1 tablet (25 mcg total) by mouth daily., Disp: , Rfl:  .  gabapentin  (NEURONTIN ) 300 MG capsule, Take 1 capsule (300 mg total) by mouth nightly., Disp: , Rfl:   SocHx:  reports that she has never smoked. She has never been exposed to tobacco smoke. She has never used smokeless tobacco. She reports that she does not currently use drugs. She reports that she does not drink alcohol .  FamHx: has no family status information on file.    Review of Systems A 12 system review of systems was negative except as noted in  HPI  special needs met: patient walks with cane  Subjective:     [name] [name] [name] presents to the wound care clinic for evaluation of a left heel pressure injury.  She underwent a left total knee replacement on 02/13/2023 by Dr. Vernetta at [facility].  She developed acute encephalopathy and was admitted to [facility] hospital from 04/17/2023 to 04/26/2023.  I personally reviewed the operative note and [facility] discharge notes.  She was diagnosed with acute deep vein thrombosis of the left lower extremity.  She was placed on Eliquis .  Mention was noted of a nonhealing left heel wound.  She was at the [facility] when it started..  She is seen for evaluation and management.  05/14/24  She returns for follow up.  She has no complaints.  04/30/24  She returns for follow up.  She is [name] [name] to have this heal and switch to a regular shoe.  04/16/24  She returns for follow up.  Her son had trouble once getting the aquacel out.  04/02/24  She returns for follow up.  She thinks she is doing good.  03/19/24  She returns for follow up. She noted her son had no problem putting the dressing in but had a little trouble getting it back out.  03/05/24  She returns for follow up. She has no complaints.  02/20/24  She returns for follow up.  She is feeling a little depressed she can't dress up and wear her dress shoes.  02/06/24  She returns for follow up.  They deny any issues with the Mepilex.  The drainage remains minimal.  01/30/24  She returns for follow up.  They report minimal to no drainage on the dressing.  01/16/24  She returns for follow up.  She is [name] [name] to have this wound be healed.  12/26/23  She returns for follow up.  She is ready to get to stop wearing the boot.  12/12/23  She returns for follow up.  She has no complaints today  11/21/23  She returns for follow up.  She has no complaints.  She is [name] [name] to get her graft.  She is getting impatient with how it is  healing.  11/14/23  She returns for follow up.  She is wondering when she might have a charity graft.  10/31/23  She returns for follow up.  She has no complaints.  10/03/23  She returns for follow up.  She and her son think the hydrafera blue is working well.  09/26/23  She returns for follow up.  Her son reports there was a lot of drainage since Sunday.  09/19/23  She returns for follow up.  She is hoping she can get the vac off today.  09/05/23  She returns for follow up.  She has a dry cough and a sinus infection.  She has laboratory studies for her primary care appointment today.  08/15/23  She returns for follow up.  She still doesn't like the vac but her son thinks it is helping.  She reports there is minimal drainage.  08/01/23  She returns for follow up.  She is not the biggest fan of the wound vac.  Her son thinks it is really helping.  07/18/23  She returns for follow up.  Her insurance is not in network for two companies.  Trying a third to obtain a wound vac.  07/11/23  She returns for follow up. She reports it just hurts from time to time.  07/04/23  She returns for follow up. Her son notes no odor with the dressing changes and little drainage.  06/26/23  She returns for follow up.  She has no concerns or issues.  Her son is doing the dressing changes without difficulty.  06/21/23  Returns for follow up.  She had a nice Christmas and reports staying off of her heel.  06/13/23  She returns for follow up.  Her son thinks her heel is doing well. The patient reports she is walking a lot around the house.  06/06/23  Initial evaluation.  Objective:   BP 191/74   Pulse 51   Temp 36.2 C (97.2 F) (Temporal)   Resp 16   SpO2 97%  General:  well appearing  Pulmonary: CTAB, no wheezes, rhonci, crackles  CV:   RRR, S1,S2, no murmurs, gallops,rubs  GI: soft, bowel sounds active, non-tender  Neuro:     AAO x 3  Psych : Normal mood and judgement  Pulse :   +2  DP  Wound:  Left heel  1.1 x 0.2 cm    Extensive surrounding callus

## 2024-05-28 ENCOUNTER — Emergency Department (HOSPITAL_COMMUNITY)
Admission: EM | Admit: 2024-05-28 | Discharge: 2024-05-28 | Disposition: A | Discharge status: Home / Self Care | Attending: Emergency Medicine | Admitting: Emergency Medicine

## 2024-05-28 ENCOUNTER — Other Ambulatory Visit: Payer: Self-pay

## 2024-05-28 ENCOUNTER — Emergency Department (HOSPITAL_COMMUNITY)

## 2024-05-28 ENCOUNTER — Encounter (HOSPITAL_COMMUNITY): Payer: Self-pay

## 2024-05-28 DIAGNOSIS — I2510 Atherosclerotic heart disease of native coronary artery without angina pectoris: Secondary | ICD-10-CM | POA: Insufficient documentation

## 2024-05-28 DIAGNOSIS — R9389 Abnormal findings on diagnostic imaging of other specified body structures: Secondary | ICD-10-CM | POA: Diagnosis not present

## 2024-05-28 DIAGNOSIS — I129 Hypertensive chronic kidney disease with stage 1 through stage 4 chronic kidney disease, or unspecified chronic kidney disease: Secondary | ICD-10-CM | POA: Diagnosis not present

## 2024-05-28 DIAGNOSIS — I10 Essential (primary) hypertension: Secondary | ICD-10-CM

## 2024-05-28 DIAGNOSIS — Z79899 Other long term (current) drug therapy: Secondary | ICD-10-CM | POA: Insufficient documentation

## 2024-05-28 DIAGNOSIS — Z7901 Long term (current) use of anticoagulants: Secondary | ICD-10-CM | POA: Insufficient documentation

## 2024-05-28 DIAGNOSIS — L89623 Pressure ulcer of left heel, stage 3: Secondary | ICD-10-CM | POA: Diagnosis not present

## 2024-05-28 DIAGNOSIS — R079 Chest pain, unspecified: Secondary | ICD-10-CM | POA: Diagnosis not present

## 2024-05-28 DIAGNOSIS — N183 Chronic kidney disease, stage 3 unspecified: Secondary | ICD-10-CM | POA: Insufficient documentation

## 2024-05-28 DIAGNOSIS — R030 Elevated blood-pressure reading, without diagnosis of hypertension: Secondary | ICD-10-CM | POA: Diagnosis present

## 2024-05-28 DIAGNOSIS — Z86718 Personal history of other venous thrombosis and embolism: Secondary | ICD-10-CM | POA: Insufficient documentation

## 2024-05-28 DIAGNOSIS — R0689 Other abnormalities of breathing: Secondary | ICD-10-CM | POA: Insufficient documentation

## 2024-05-28 LAB — CBC WITH DIFFERENTIAL/PLATELET
Abs Immature Granulocytes: 0 K/uL (ref 0.00–0.07)
Basophils Absolute: 0.1 K/uL (ref 0.0–0.1)
Basophils Relative: 2 %
Eosinophils Absolute: 0.2 K/uL (ref 0.0–0.5)
Eosinophils Relative: 6 %
HCT: 38.7 % (ref 36.0–46.0)
Hemoglobin: 12.6 g/dL (ref 12.0–15.0)
Immature Granulocytes: 0 %
Lymphocytes Relative: 29 %
Lymphs Abs: 1.1 K/uL (ref 0.7–4.0)
MCH: 31 pg (ref 26.0–34.0)
MCHC: 32.6 g/dL (ref 30.0–36.0)
MCV: 95.1 fL (ref 80.0–100.0)
Monocytes Absolute: 0.4 K/uL (ref 0.1–1.0)
Monocytes Relative: 10 %
Neutro Abs: 2 K/uL (ref 1.7–7.7)
Neutrophils Relative %: 53 %
Platelets: 186 K/uL (ref 150–400)
RBC: 4.07 MIL/uL (ref 3.87–5.11)
RDW: 13.4 % (ref 11.5–15.5)
WBC: 3.7 K/uL — ABNORMAL LOW (ref 4.0–10.5)
nRBC: 0 % (ref 0.0–0.2)

## 2024-05-28 LAB — COMPREHENSIVE METABOLIC PANEL WITH GFR
ALT: 23 U/L (ref 0–44)
AST: 33 U/L (ref 15–41)
Albumin: 4.2 g/dL (ref 3.5–5.0)
Alkaline Phosphatase: 104 U/L (ref 38–126)
Anion gap: 8 (ref 5–15)
BUN: 16 mg/dL (ref 8–23)
CO2: 28 mmol/L (ref 22–32)
Calcium: 9.7 mg/dL (ref 8.9–10.3)
Chloride: 108 mmol/L (ref 98–111)
Creatinine, Ser: 1.17 mg/dL — ABNORMAL HIGH (ref 0.44–1.00)
GFR, Estimated: 49 mL/min — ABNORMAL LOW (ref >60)
Glucose, Bld: 102 mg/dL — ABNORMAL HIGH (ref 70–99)
Potassium: 4.5 mmol/L (ref 3.5–5.1)
Sodium: 144 mmol/L (ref 135–145)
Total Bilirubin: 0.4 mg/dL (ref 0.0–1.2)
Total Protein: 7.5 g/dL (ref 6.5–8.1)

## 2024-05-28 LAB — PRO BRAIN NATRIURETIC PEPTIDE: Pro Brain Natriuretic Peptide: 563 pg/mL — ABNORMAL HIGH (ref <300.0)

## 2024-05-28 LAB — MAGNESIUM: Magnesium: 2.3 mg/dL (ref 1.7–2.4)

## 2024-05-28 LAB — TROPONIN T, HIGH SENSITIVITY
Troponin T High Sensitivity: 34 ng/L — ABNORMAL HIGH (ref 0–19)
Troponin T High Sensitivity: 31 ng/L — ABNORMAL HIGH (ref 0–19)

## 2024-05-28 MED ORDER — AMLODIPINE BESYLATE 5 MG PO TABS
5.0000 mg | ORAL_TABLET | Freq: Once | ORAL | Status: AC
  Administered 2024-05-28: 5 mg via ORAL
  Filled 2024-05-28: qty 1

## 2024-05-28 NOTE — ED Triage Notes (Signed)
 Pt arrived via POV c/o hypertension and reports she was at [facility] this morning who advised her to go to her PCP Office, who then, reports checking Pts BP and received a reading of 200/120. Pt then advised to come to the ED for evaluation. Pt reports she has not missed any of her BP medications and reports she has had a hard time keeping it under control in the past.

## 2024-05-28 NOTE — Discharge Instructions (Signed)
 Increase your amlodipine  (Norvasc ) to 1 and a half pills a day-this will be a total of 7.5 mg-and follow up closely with your PCP. If you chest pain, shortness of breath, severe headache, numbness to the weakness or other symptoms please come back to the ER right away.  Blood pressures improved somewhat without intervention.  We gave you some additional amlodipine  today as well.

## 2024-05-28 NOTE — ED Provider Notes (Signed)
 [place] EMERGENCY DEPARTMENT AT [facility] Provider Note   CSN: [id number] Arrival date & time: 05/28/24  1208     Patient presents with: Hypertension   [name] [name] is a 73 y.o. female.  She has history of hypertension, CAD, GERD, degenerative disc disease, stage III CKD, left leg DVT on Eliquis .  She is being treated for a left foot wound with wound care.  Wound care was noted to have blood pressure elevated and was up to PCP, at the PCP office her blood pressures were 200/120.  Patient reports she had taken her medication today.  She denies chest pain or shortness of breath, denies nausea or vomiting, no dizziness, no headache, no numbness tingling or weakness.  Her only complaint is fatigue which she states is chronic and unchanged.  Reports her blood pressure is generally high.    Hypertension       Prior to Admission medications   Medication Sig Start Date End Date Taking? Authorizing Provider  acetaminophen  (TYLENOL ) 500 MG tablet Take 1,000 mg by mouth every 6 (six) hours as needed for moderate pain.    [provider]  albuterol  (VENTOLIN  HFA) 108 (90 Base) MCG/ACT inhaler Inhale 2 puffs into the lungs every 6 (six) hours as needed for wheezing or shortness of breath.    [provider]  amLODipine  (NORVASC ) 5 MG tablet Take 5 mg by mouth daily.    [provider]  apixaban  (ELIQUIS ) 5 MG TABS tablet Take 1 tablet (5 mg total) by mouth 2 (two) times daily. 03/19/24   Geofm Delon BRAVO, NP  atorvastatin  (LIPITOR) 40 MG tablet Take 40 mg by mouth daily. 04/28/19   [provider]  carvedilol (COREG) 6.25 MG tablet Take 6.25 mg by mouth daily.    [provider]  cholecalciferol  (VITAMIN D3) 25 MCG (1000 UNIT) tablet Take 1,000 Units by mouth daily.    [provider]  gabapentin  (NEURONTIN ) 300 MG capsule Take 300 mg by mouth at bedtime.    [provider]  metFORMIN  (GLUCOPHAGE ) 500 MG tablet Take 500  mg by mouth daily. 03/11/15   [provider]  metoprolol  tartrate (LOPRESSOR ) 25 MG tablet Take 25 mg by mouth 2 (two) times daily. Take for 14 days    [provider]  Polyethyl Glycol-Propyl Glycol (LUBRICATING EYE DROPS OP) Place 1 drop into both eyes daily as needed (dry eyes).    [provider]    Allergies: Baclofen and Methocarbamol     Review of Systems  Updated Vital Signs BP (!) 191/87   Pulse (!) 48   Temp 98.1 F (36.7 C) (Oral)   Resp 14   Ht 5' 7 (1.702 m)   Wt 99.9 kg   SpO2 98%   BMI 34.49 kg/m   Physical Exam Vitals and nursing note reviewed.  Constitutional:      General: She is not in acute distress.    Appearance: She is well-developed.  HENT:     Head: Normocephalic and atraumatic.  Eyes:     Conjunctiva/sclera: Conjunctivae normal.  Cardiovascular:     Rate and Rhythm: Normal rate and regular rhythm.     Heart sounds: No murmur heard. Pulmonary:     Effort: Pulmonary effort is normal. No respiratory distress.     Breath sounds: Normal breath sounds.  Abdominal:     Palpations: Abdomen is soft.     Tenderness: There is no abdominal tenderness.  Musculoskeletal:  General: No swelling.     Cervical back: Neck supple.     Right lower leg: No edema.     Left lower leg: No edema.  Skin:    General: Skin is warm and dry.     Capillary Refill: Capillary refill takes less than 2 seconds.  Neurological:     General: No focal deficit present.     Mental Status: She is alert and oriented to person, place, and time.     Motor: No weakness.     Coordination: Coordination normal.  Psychiatric:        Mood and Affect: Mood normal.     (all labs ordered are listed, but only abnormal results are displayed) Labs Reviewed  CBC WITH DIFFERENTIAL/PLATELET - Abnormal; Notable for the following components:      Result Value   WBC 3.7 (*)    All other components within normal limits  COMPREHENSIVE METABOLIC PANEL WITH GFR -  Abnormal; Notable for the following components:   Glucose, Bld 102 (*)    Creatinine, Ser 1.17 (*)    GFR, Estimated 49 (*)    All other components within normal limits  PRO BRAIN NATRIURETIC PEPTIDE - Abnormal; Notable for the following components:   Pro Brain Natriuretic Peptide 563.0 (*)    All other components within normal limits  TROPONIN T, HIGH SENSITIVITY - Abnormal; Notable for the following components:   Troponin T High Sensitivity 34 (*)    All other components within normal limits  MAGNESIUM  TROPONIN T, HIGH SENSITIVITY    EKG: None  Radiology: DG Chest 2 View Result Date: 05/28/2024 CLINICAL DATA:  Chest pain and hypertension. EXAM: CHEST - 2 VIEW COMPARISON:  01/26/2021 FINDINGS: The heart size and mediastinal contours are within normal limits. Stable elevated right hemidiaphragm. There is no evidence of pulmonary edema, consolidation, pneumothorax, nodule or pleural fluid. The visualized skeletal structures are unremarkable. IMPRESSION: No active cardiopulmonary disease. Stable elevated right hemidiaphragm. Electronically Signed   By: Marcey Moan M.D.   On: 05/28/2024 13:39     Procedures   Medications Ordered in the ED - No data to display                                  Medical Decision Making Differential diagnosis includes but limited to ACS, hypertensive urgency, AKI, CVA, other  ED course: Patient presents for asymptomatic hypertension.  Blood pressure in the ER has gradually gone down.  She has been 180s to 190s in the ED here and has remained asymptomatic.  I reviewed her medications.  She is on amlodipine  5 mg, and Coreg 6.25 mg.  She states the Coreg she takes 1-1/2 pills in the morning and 1 pill at bedtime but only takes amlodipine  5 mg once daily.  Discussed we can increase the amlodipine  to 7.5 mg daily and have her follow-up closely with PCP.  She was given strict return precautions.  Her kidney function is at baseline, troponin is elevated but  delta is only 3, BNP does not suggest acute heart failure and she is not having any chest pain or shortness of breath or lower extremity swelling.  Chest x-ray showed no cardiomegaly, no pulmonary edema or infiltrate.  Amount and/or Complexity of Data Reviewed Labs: ordered. Radiology: ordered and independent interpretation performed.    Details: I agree with radiology reading. ECG/medicine tests:     Details: Sinus bradycardia rate of  56 bpm  Risk Prescription drug management.        Final diagnoses:  None    ED Discharge Orders     None          Suellen Sherran DELENA DEVONNA 05/28/24 2311    Suzette Pac, MD 05/30/24 1158

## 2024-06-18 ENCOUNTER — Ambulatory Visit (HOSPITAL_COMMUNITY)
Admission: RE | Admit: 2024-06-18 | Discharge: 2024-06-18 | Disposition: A | Discharge status: Home / Self Care | Source: Ambulatory Visit | Attending: Oncology | Admitting: Oncology

## 2024-06-18 ENCOUNTER — Inpatient Hospital Stay: Attending: Hematology

## 2024-06-18 DIAGNOSIS — I82512 Chronic embolism and thrombosis of left femoral vein: Secondary | ICD-10-CM | POA: Diagnosis present

## 2024-06-18 DIAGNOSIS — Z7901 Long term (current) use of anticoagulants: Secondary | ICD-10-CM | POA: Diagnosis not present

## 2024-06-18 LAB — CBC WITH DIFFERENTIAL/PLATELET
Abs Immature Granulocytes: 0.02 K/uL (ref 0.00–0.07)
Basophils Absolute: 0.1 K/uL (ref 0.0–0.1)
Basophils Relative: 1 %
Eosinophils Absolute: 0.2 K/uL (ref 0.0–0.5)
Eosinophils Relative: 3 %
HCT: 38.2 % (ref 36.0–46.0)
Hemoglobin: 12.6 g/dL (ref 12.0–15.0)
Immature Granulocytes: 0 %
Lymphocytes Relative: 16 %
Lymphs Abs: 0.8 K/uL (ref 0.7–4.0)
MCH: 31.4 pg (ref 26.0–34.0)
MCHC: 33 g/dL (ref 30.0–36.0)
MCV: 95.3 fL (ref 80.0–100.0)
Monocytes Absolute: 0.4 K/uL (ref 0.1–1.0)
Monocytes Relative: 8 %
Neutro Abs: 3.3 K/uL (ref 1.7–7.7)
Neutrophils Relative %: 72 %
Platelets: 212 K/uL (ref 150–400)
RBC: 4.01 MIL/uL (ref 3.87–5.11)
RDW: 13.6 % (ref 11.5–15.5)
WBC: 4.6 K/uL (ref 4.0–10.5)
nRBC: 0 % (ref 0.0–0.2)

## 2024-06-18 LAB — D-DIMER, QUANTITATIVE: D-Dimer, Quant: 1.01 ug{FEU}/mL — ABNORMAL HIGH (ref 0.00–0.50)

## 2024-06-23 ENCOUNTER — Encounter: Payer: Self-pay | Admitting: *Deleted

## 2024-06-25 ENCOUNTER — Inpatient Hospital Stay (HOSPITAL_BASED_OUTPATIENT_CLINIC_OR_DEPARTMENT_OTHER): Admitting: Oncology

## 2024-06-25 VITALS — BP 127/61 | HR 54 | Temp 98.3°F | Resp 18 | Ht 66.5 in | Wt 222.0 lb

## 2024-06-25 DIAGNOSIS — L89611 Pressure ulcer of right heel, stage 1: Secondary | ICD-10-CM | POA: Diagnosis not present

## 2024-06-25 DIAGNOSIS — I82402 Acute embolism and thrombosis of unspecified deep veins of left lower extremity: Secondary | ICD-10-CM

## 2024-06-25 DIAGNOSIS — I82512 Chronic embolism and thrombosis of left femoral vein: Secondary | ICD-10-CM | POA: Diagnosis not present

## 2024-06-25 NOTE — Progress Notes (Signed)
 "  [facility] OFFICE PROGRESS NOTE  Burdine, Elspeth BRAVO, MD  ASSESSMENT & PLAN:    Assessment & Plan Deep vein thrombosis (DVT) of left lower extremity, unspecified chronicity, unspecified vein (HCC) - Most recent left lower extremity Doppler from 06/18/2024 showed negative for acute DVT in the left lower extremity.  Chronic occlusion left femoral vein and one of the paired posterior tibial veins unchanged when compared to previous study. -D-dimer continues to be slightly elevated at 1.01. -We discussed risk versus benefit of stopping versus continuing Eliquis .  Patient had provoked DVT in the setting of surgery. -Discussed when her boot is removed, she will be able to increase her activity but right now she is nervous to do a lot of moving around given the boot is in place. -Patient would like to come off of her blood thinner.  I would like to repeat her D-dimer in 1 month and she was agreeable if it is more or less stable, return to clinic in 6 months with lab work and telephone visit. Pressure injury of right heel, stage 1 She is followed closely by [facility] for this. Wounds have been present ever since her hip replacement back in August 2024. Think she will be able to remove her boot this month or so.  This will help with her mobility.  Orders Placed This Encounter  Procedures   D-dimer, quantitative    Standing Status:   Future    Expected Date:   07/26/2024    Expiration Date:   06/25/2025    Release to patient:   Immediate [1]   CBC with Differential    Standing Status:   Future    Expected Date:   07/26/2024    Expiration Date:   06/25/2025   D-dimer, quantitative    Standing Status:   Future    Expected Date:   12/23/2024    Expiration Date:   03/23/2025    Release to patient:   Immediate [1]   CBC with Differential    Standing Status:   Future    Expected Date:   12/23/2024    Expiration Date:   03/23/2025    INTERVAL HISTORY: Patient returns for follow-up.  She  is currently still taking Eliquis  daily.  Denies any bleeding, bright red blood per rectum, hematochezia or melena.  Patient is followed by the wound clinic in [facility] for left heel pressure injury after a left total knee replacement on 02/13/2023.  Reports she continues to wear a boot on her left foot which does limit her ability to move around.  She is scared of falling.  Reports wound should be completely healed by her next visit in 1 month.  Reports appetite of 100% energy levels are 25%.  Reports numbness and tingling of her hands and feet.  She is wanting to come off of her Eliquis  if possible.  Denies any new concerns for recurrence of blood clots.  Since her last visit with us , she was evaluated for hypertension on 05/28/2024.  Blood pressure has been under control ever since.  We reviewed D-dimer and cbc.   SUMMARY OF HEMATOLOGIC HISTORY: Oncology History Overview Note  1.  Provoked left leg DVT: - Patient seen at the request of Dr. Lari. - Had left knee replacement on 02/13/2023, and was at the rehab center in [place] with decreased mobility. - 02/2023: Diagnosed with left leg DVT.  I do not have the original scan report to review. - She was initially started  on Eliquis , which was changed to Coumadin due to cost. - Doppler on 06/21/2023: Age-indeterminate DVT involving left femoral vein, left popliteal vein. - Doppler on 09/17/2023: Findings consistent with chronic DVT involving left femoral vein. - Doppler on 12/13/2023: Small area of nonocclusive thrombus along the medial femoral vein. - She continues to be on warfarin 4 mg daily. - She had colonoscopy in November 2020, 1 hyperplastic polyp removed.  She is having mammograms every 2 years and is scheduled to have it soon at Dr. Cindi office. - No B symptoms.  No miscarriages.   2.  Social/family history: - She lives at home by itself and walks with the help of walker and cane.  Son lives about 10 minutes away and checks on her daily.   She is non-smoker. - No family history of thrombosis.  Sister had cancer, type unknown to the patient.  Brother died of cancer.    No problem history exists.     CBC    Component Value Date/Time   WBC 4.6 06/18/2024 1104   RBC 4.01 06/18/2024 1104   HGB 12.6 06/18/2024 1104   HCT 38.2 06/18/2024 1104   PLT 212 06/18/2024 1104   MCV 95.3 06/18/2024 1104   MCH 31.4 06/18/2024 1104   MCHC 33.0 06/18/2024 1104   RDW 13.6 06/18/2024 1104   LYMPHSABS 0.8 06/18/2024 1104   MONOABS 0.4 06/18/2024 1104   EOSABS 0.2 06/18/2024 1104   BASOSABS 0.1 06/18/2024 1104       Latest Ref Rng & Units 05/28/2024    1:05 PM 02/20/2023   12:56 PM 02/14/2023    3:33 AM  CMP  Glucose 70 - 99 mg/dL 897  881  873   BUN 8 - 23 mg/dL 16  30  23    Creatinine 0.44 - 1.00 mg/dL 8.82  8.90  8.96   Sodium 135 - 145 mmol/L 144  136  138   Potassium 3.5 - 5.1 mmol/L 4.5  3.8  3.9   Chloride 98 - 111 mmol/L 108  99  105   CO2 22 - 32 mmol/L 28  29  23    Calcium  8.9 - 10.3 mg/dL 9.7  9.0  9.2   Total Protein 6.5 - 8.1 g/dL 7.5  7.2    Total Bilirubin 0.0 - 1.2 mg/dL 0.4  1.7    Alkaline Phos 38 - 126 U/L 104  88    AST 15 - 41 U/L 33  100    ALT 0 - 44 U/L 23  56       Lab Results  Component Value Date   VITAMINB12 183 (L) 09/05/2022    Vitals:   06/25/24 1101  BP: 127/61  Pulse: (!) 54  Resp: 18  Temp: 98.3 F (36.8 C)  SpO2: 99%    Review of System:  Review of Systems  Constitutional:  Positive for malaise/fatigue.  Gastrointestinal:  Positive for constipation.  Musculoskeletal:  Positive for joint pain.  Skin:        Wound to left heel   Neurological:  Positive for tingling, sensory change and weakness.  Psychiatric/Behavioral:  The patient has insomnia.     Physical Exam: Physical Exam Constitutional:      Appearance: Normal appearance.  HENT:     Head: Normocephalic and atraumatic.  Eyes:     Pupils: Pupils are equal, round, and reactive to light.  Cardiovascular:      Rate and Rhythm: Normal rate and regular rhythm.  Heart sounds: Normal heart sounds. No murmur heard. Pulmonary:     Effort: Pulmonary effort is normal.     Breath sounds: Normal breath sounds. No wheezing.  Abdominal:     General: Bowel sounds are normal. There is no distension.     Palpations: Abdomen is soft.     Tenderness: There is no abdominal tenderness.  Musculoskeletal:        General: Normal range of motion.     Cervical back: Normal range of motion.  Feet:     Comments: Left foot boot in place Skin:    General: Skin is warm and dry.     Findings: No rash.  Neurological:     Mental Status: She is alert and oriented to person, place, and time.     Gait: Gait is intact.  Psychiatric:        Mood and Affect: Mood and affect normal.        Cognition and Memory: Memory normal.        Judgment: Judgment normal.      I spent 20 minutes dedicated to the care of this patient (face-to-face and non-face-to-face) on the date of the encounter to include what is described in the assessment and plan.,  Delon Hope, NP 06/25/2024 1:32 PM "

## 2024-06-25 NOTE — Assessment & Plan Note (Addendum)
-   Most recent left lower extremity Doppler from 06/18/2024 showed negative for acute DVT in the left lower extremity.  Chronic occlusion left femoral vein and one of the paired posterior tibial veins unchanged when compared to previous study. -D-dimer continues to be slightly elevated at 1.01. -We discussed risk versus benefit of stopping versus continuing Eliquis .  Patient had provoked DVT in the setting of surgery. -Discussed when her boot is removed, she will be able to increase her activity but right now she is nervous to do a lot of moving around given the boot is in place. -Patient would like to come off of her blood thinner.  I would like to repeat her D-dimer in 1 month and she was agreeable if it is more or less stable, return to clinic in 6 months with lab work and telephone visit.

## 2024-06-25 NOTE — Assessment & Plan Note (Addendum)
 She is followed closely by [facility] for this. Wounds have been present ever since her hip replacement back in August 2024. Think she will be able to remove her boot this month or so.  This will help with her mobility.

## 2024-07-27 ENCOUNTER — Encounter: Payer: Self-pay | Admitting: *Deleted

## 2024-07-28 ENCOUNTER — Inpatient Hospital Stay

## 2024-07-31 ENCOUNTER — Inpatient Hospital Stay: Attending: Hematology

## 2024-07-31 DIAGNOSIS — I82402 Acute embolism and thrombosis of unspecified deep veins of left lower extremity: Secondary | ICD-10-CM

## 2024-07-31 LAB — CBC WITH DIFFERENTIAL/PLATELET
Abs Immature Granulocytes: 0.01 10*3/uL (ref 0.00–0.07)
Basophils Absolute: 0 10*3/uL (ref 0.0–0.1)
Basophils Relative: 1 %
Eosinophils Absolute: 0.2 10*3/uL (ref 0.0–0.5)
Eosinophils Relative: 4 %
HCT: 39.7 % (ref 36.0–46.0)
Hemoglobin: 12.9 g/dL (ref 12.0–15.0)
Immature Granulocytes: 0 %
Lymphocytes Relative: 26 %
Lymphs Abs: 1.1 10*3/uL (ref 0.7–4.0)
MCH: 31.3 pg (ref 26.0–34.0)
MCHC: 32.5 g/dL (ref 30.0–36.0)
MCV: 96.4 fL (ref 80.0–100.0)
Monocytes Absolute: 0.5 10*3/uL (ref 0.1–1.0)
Monocytes Relative: 12 %
Neutro Abs: 2.4 10*3/uL (ref 1.7–7.7)
Neutrophils Relative %: 57 %
Platelets: 196 10*3/uL (ref 150–400)
RBC: 4.12 MIL/uL (ref 3.87–5.11)
RDW: 13.6 % (ref 11.5–15.5)
WBC: 4.2 10*3/uL (ref 4.0–10.5)
nRBC: 0 % (ref 0.0–0.2)

## 2024-07-31 LAB — D-DIMER, QUANTITATIVE: D-Dimer, Quant: 1.26 ug{FEU}/mL — ABNORMAL HIGH (ref 0.00–0.50)

## 2024-08-01 ENCOUNTER — Ambulatory Visit: Payer: Self-pay | Admitting: Oncology

## 2024-08-01 NOTE — Progress Notes (Signed)
 So patient was wanting to come off of her Eliquis .  Can you confirm if she stopped taking?  If she stopped taking then we need to see if she has any symptoms of a clot.  Her D-dimer has kind of been all over the place so not certain this is a reliable lab.  Delon Hope, AGNP-C Department of Hematology/Oncology [facility] at [facility]  Phone: [phone]  08/01/2024 1:59 PM

## 2024-12-23 ENCOUNTER — Inpatient Hospital Stay: Attending: Hematology

## 2024-12-30 ENCOUNTER — Inpatient Hospital Stay: Attending: Hematology | Admitting: Oncology
